# Patient Record
Sex: Male | Born: 1940 | Race: White | Hispanic: No | Marital: Single | Smoking: Current every day smoker
Health system: Southern US, Community
[De-identification: ages and names within clinical notes are randomized; demographics above are authoritative.]

## PROBLEM LIST (undated history)

## (undated) DIAGNOSIS — E785 Hyperlipidemia, unspecified: Secondary | ICD-10-CM

## (undated) DIAGNOSIS — I10 Essential (primary) hypertension: Secondary | ICD-10-CM

## (undated) DIAGNOSIS — K635 Polyp of colon: Secondary | ICD-10-CM

## (undated) DIAGNOSIS — R413 Other amnesia: Secondary | ICD-10-CM

## (undated) DIAGNOSIS — J019 Acute sinusitis, unspecified: Secondary | ICD-10-CM

## (undated) DIAGNOSIS — R0602 Shortness of breath: Secondary | ICD-10-CM

## (undated) DIAGNOSIS — T148XXA Other injury of unspecified body region, initial encounter: Secondary | ICD-10-CM

## (undated) DIAGNOSIS — IMO0002 Reserved for concepts with insufficient information to code with codable children: Secondary | ICD-10-CM

## (undated) DIAGNOSIS — J329 Chronic sinusitis, unspecified: Secondary | ICD-10-CM

## (undated) DIAGNOSIS — M545 Low back pain, unspecified: Secondary | ICD-10-CM

## (undated) DIAGNOSIS — R51 Headache: Secondary | ICD-10-CM

## (undated) DIAGNOSIS — M48061 Spinal stenosis, lumbar region without neurogenic claudication: Secondary | ICD-10-CM

## (undated) DIAGNOSIS — C3490 Malignant neoplasm of unspecified part of unspecified bronchus or lung: Secondary | ICD-10-CM

## (undated) DIAGNOSIS — L24A9 Irritant contact dermatitis due friction or contact with other specified body fluids: Secondary | ICD-10-CM

## (undated) DIAGNOSIS — K219 Gastro-esophageal reflux disease without esophagitis: Secondary | ICD-10-CM

## (undated) DIAGNOSIS — D72829 Elevated white blood cell count, unspecified: Secondary | ICD-10-CM

## (undated) DIAGNOSIS — T7840XA Allergy, unspecified, initial encounter: Secondary | ICD-10-CM

## (undated) DIAGNOSIS — R351 Nocturia: Secondary | ICD-10-CM

## (undated) HISTORY — DX: Low back pain, unspecified: M54.50

## (undated) HISTORY — DX: Hyperlipidemia, unspecified: E78.5

## (undated) HISTORY — DX: Headache: R51

## (undated) HISTORY — DX: Chronic sinusitis, unspecified: J32.9

## (undated) HISTORY — DX: Reserved for concepts with insufficient information to code with codable children: IMO0002

## (undated) HISTORY — DX: Allergy, unspecified, initial encounter: T78.40XA

## (undated) HISTORY — PX: OTHER SURGICAL HISTORY: SHX169

## (undated) HISTORY — PX: PENILE PROSTHESIS IMPLANT: SHX240

## (undated) HISTORY — DX: Other amnesia: R41.3

## (undated) HISTORY — DX: Acute sinusitis, unspecified: J01.90

## (undated) HISTORY — PX: URETHRAL STRICTURE DILATATION: SHX477

## (undated) HISTORY — DX: Essential (primary) hypertension: I10

## (undated) HISTORY — DX: Elevated white blood cell count, unspecified: D72.829

## (undated) HISTORY — DX: Polyp of colon: K63.5

## (undated) HISTORY — PX: COLONOSCOPY: SHX174

## (undated) HISTORY — DX: Gastro-esophageal reflux disease without esophagitis: K21.9

## (undated) HISTORY — DX: Low back pain: M54.5

---

## 1972-02-11 HISTORY — PX: LUMBAR LAMINECTOMY: SHX95

## 2003-05-02 ENCOUNTER — Encounter: Admission: RE | Admit: 2003-05-02 | Discharge: 2003-05-02 | Payer: Self-pay | Admitting: Diagnostic Radiology

## 2003-12-12 ENCOUNTER — Ambulatory Visit: Payer: Self-pay | Admitting: Family Medicine

## 2004-07-24 ENCOUNTER — Ambulatory Visit: Payer: Self-pay | Admitting: Family Medicine

## 2004-08-07 ENCOUNTER — Ambulatory Visit: Payer: Self-pay | Admitting: Family Medicine

## 2005-03-12 ENCOUNTER — Ambulatory Visit: Payer: Self-pay | Admitting: Family Medicine

## 2005-03-18 ENCOUNTER — Ambulatory Visit: Payer: Self-pay

## 2005-07-22 ENCOUNTER — Ambulatory Visit: Payer: Self-pay | Admitting: Family Medicine

## 2006-02-17 ENCOUNTER — Ambulatory Visit: Payer: Self-pay | Admitting: Family Medicine

## 2006-02-18 ENCOUNTER — Ambulatory Visit: Payer: Self-pay | Admitting: Family Medicine

## 2006-02-18 LAB — CONVERTED CEMR LAB
Basophils Relative: 0.1 % (ref 0.0–1.0)
CO2: 28 meq/L (ref 19–32)
Calcium: 9.5 mg/dL (ref 8.4–10.5)
Chol/HDL Ratio, serum: 5.3
Cholesterol: 226 mg/dL (ref 0–200)
Eosinophil percent: 0.7 % (ref 0.0–5.0)
GFR calc non Af Amer: 89 mL/min
Glomerular Filtration Rate, Af Am: 108 mL/min/{1.73_m2}
Glucose, Bld: 101 mg/dL — ABNORMAL HIGH (ref 70–99)
HCT: 44.7 % (ref 39.0–52.0)
Hemoglobin: 15.3 g/dL (ref 13.0–17.0)
Lymphocytes Relative: 22.2 % (ref 12.0–46.0)
Monocytes Absolute: 1.1 10*3/uL — ABNORMAL HIGH (ref 0.2–0.7)
Monocytes Relative: 8.1 % (ref 3.0–11.0)
Neutro Abs: 9 10*3/uL — ABNORMAL HIGH (ref 1.4–7.7)
Neutrophils Relative %: 68.9 % (ref 43.0–77.0)
PSA: 0.67 ng/mL (ref 0.10–4.00)
Potassium: 4.7 meq/L (ref 3.5–5.1)
RDW: 13.4 % (ref 11.5–14.6)
Sodium: 143 meq/L (ref 135–145)
TSH: 1.46 microintl units/mL (ref 0.35–5.50)
VLDL: 14 mg/dL (ref 0–40)
WBC: 13.1 10*3/uL — ABNORMAL HIGH (ref 4.5–10.5)

## 2006-05-27 ENCOUNTER — Ambulatory Visit: Payer: Self-pay | Admitting: Family Medicine

## 2006-05-27 LAB — CONVERTED CEMR LAB
AST: 17 units/L (ref 0–37)
Cholesterol: 111 mg/dL (ref 0–200)
HDL: 29 mg/dL — ABNORMAL LOW (ref >39.0)
LDL Cholesterol: 72 mg/dL (ref 0–99)
Total CHOL/HDL Ratio: 3.8

## 2006-06-11 ENCOUNTER — Encounter: Admission: RE | Admit: 2006-06-11 | Discharge: 2006-06-11 | Payer: Self-pay | Admitting: Family Medicine

## 2006-06-11 ENCOUNTER — Ambulatory Visit: Payer: Self-pay | Admitting: Family Medicine

## 2006-07-28 ENCOUNTER — Ambulatory Visit: Payer: Self-pay | Admitting: Family Medicine

## 2006-10-28 ENCOUNTER — Ambulatory Visit: Payer: Self-pay | Admitting: Family Medicine

## 2006-10-28 DIAGNOSIS — F528 Other sexual dysfunction not due to a substance or known physiological condition: Secondary | ICD-10-CM | POA: Insufficient documentation

## 2006-10-28 DIAGNOSIS — N400 Enlarged prostate without lower urinary tract symptoms: Secondary | ICD-10-CM

## 2006-12-21 ENCOUNTER — Ambulatory Visit: Payer: Self-pay | Admitting: Family Medicine

## 2006-12-22 ENCOUNTER — Encounter: Payer: Self-pay | Admitting: Family Medicine

## 2007-01-13 ENCOUNTER — Ambulatory Visit: Payer: Self-pay | Admitting: Family Medicine

## 2007-02-17 ENCOUNTER — Ambulatory Visit: Payer: Self-pay | Admitting: Family Medicine

## 2007-03-01 LAB — CONVERTED CEMR LAB
ALT: 15 units/L (ref 0–53)
AST: 15 units/L (ref 0–37)
Albumin: 4.1 g/dL (ref 3.5–5.2)
Basophils Absolute: 0 10*3/uL (ref 0.0–0.1)
Calcium: 10 mg/dL (ref 8.4–10.5)
Chloride: 104 meq/L (ref 96–112)
Creatinine, Ser: 0.9 mg/dL (ref 0.4–1.5)
Eosinophils Absolute: 0.1 10*3/uL (ref 0.0–0.6)
Eosinophils Relative: 0.7 % (ref 0.0–5.0)
GFR calc non Af Amer: 89 mL/min
LDL Cholesterol: 126 mg/dL — ABNORMAL HIGH (ref 0–99)
MCHC: 33.4 g/dL (ref 30.0–36.0)
MCV: 95 fL (ref 78.0–100.0)
Platelets: 331 10*3/uL (ref 150–400)
RBC: 4.83 M/uL (ref 4.22–5.81)
RDW: 12.9 % (ref 11.5–14.6)
Total CHOL/HDL Ratio: 4.5
Triglycerides: 88 mg/dL (ref 0–149)
WBC: 12.6 10*3/uL — ABNORMAL HIGH (ref 4.5–10.5)

## 2007-07-28 ENCOUNTER — Ambulatory Visit: Payer: Self-pay | Admitting: Family Medicine

## 2007-07-28 DIAGNOSIS — B369 Superficial mycosis, unspecified: Secondary | ICD-10-CM | POA: Insufficient documentation

## 2008-04-06 ENCOUNTER — Ambulatory Visit: Payer: Self-pay | Admitting: Family Medicine

## 2008-04-06 DIAGNOSIS — L719 Rosacea, unspecified: Secondary | ICD-10-CM | POA: Insufficient documentation

## 2008-04-06 LAB — CONVERTED CEMR LAB: Hemoglobin: 16.1 g/dL

## 2008-04-12 ENCOUNTER — Telehealth: Payer: Self-pay | Admitting: Family Medicine

## 2008-04-12 LAB — CONVERTED CEMR LAB
Albumin: 4.2 g/dL (ref 3.5–5.2)
Alkaline Phosphatase: 93 units/L (ref 39–117)
Cholesterol: 208 mg/dL (ref 0–200)
Direct LDL: 148.2 mg/dL
HDL: 39.4 mg/dL (ref >39.0)
Total Protein: 7.8 g/dL (ref 6.0–8.3)
Triglycerides: 108 mg/dL (ref 0–149)
VLDL: 22 mg/dL (ref 0–40)

## 2008-04-18 ENCOUNTER — Ambulatory Visit: Payer: Self-pay | Admitting: Family Medicine

## 2008-04-18 DIAGNOSIS — I10 Essential (primary) hypertension: Secondary | ICD-10-CM

## 2008-09-19 ENCOUNTER — Encounter (INDEPENDENT_AMBULATORY_CARE_PROVIDER_SITE_OTHER): Payer: Self-pay | Admitting: *Deleted

## 2008-10-17 ENCOUNTER — Ambulatory Visit: Payer: Self-pay | Admitting: Internal Medicine

## 2008-11-07 ENCOUNTER — Encounter: Payer: Self-pay | Admitting: Internal Medicine

## 2008-11-07 ENCOUNTER — Ambulatory Visit: Payer: Self-pay | Admitting: Internal Medicine

## 2008-11-12 ENCOUNTER — Encounter: Payer: Self-pay | Admitting: Internal Medicine

## 2009-03-08 ENCOUNTER — Ambulatory Visit: Payer: Self-pay | Admitting: Family Medicine

## 2009-03-08 DIAGNOSIS — M940 Chondrocostal junction syndrome [Tietze]: Secondary | ICD-10-CM

## 2009-04-19 ENCOUNTER — Ambulatory Visit: Payer: Self-pay | Admitting: Family Medicine

## 2009-05-03 ENCOUNTER — Ambulatory Visit: Payer: Self-pay | Admitting: Family Medicine

## 2009-05-03 DIAGNOSIS — J449 Chronic obstructive pulmonary disease, unspecified: Secondary | ICD-10-CM

## 2009-05-10 ENCOUNTER — Ambulatory Visit: Payer: Self-pay | Admitting: Family Medicine

## 2009-05-24 ENCOUNTER — Ambulatory Visit: Payer: Self-pay | Admitting: Family Medicine

## 2009-10-22 ENCOUNTER — Ambulatory Visit: Payer: Self-pay | Admitting: Family Medicine

## 2009-10-22 DIAGNOSIS — F17200 Nicotine dependence, unspecified, uncomplicated: Secondary | ICD-10-CM | POA: Insufficient documentation

## 2009-11-05 ENCOUNTER — Ambulatory Visit: Payer: Self-pay | Admitting: Family Medicine

## 2009-11-05 DIAGNOSIS — K219 Gastro-esophageal reflux disease without esophagitis: Secondary | ICD-10-CM

## 2009-11-05 LAB — CONVERTED CEMR LAB
Basophils Relative: 0.5 % (ref 0.0–3.0)
Bilirubin Urine: NEGATIVE
Blood in Urine, dipstick: NEGATIVE
Eosinophils Absolute: 0.1 10*3/uL (ref 0.0–0.7)
Eosinophils Relative: 1.5 % (ref 0.0–5.0)
Hemoglobin: 14.7 g/dL (ref 13.0–17.0)
Ketones, urine, test strip: NEGATIVE
Lymphocytes Relative: 20.2 % (ref 12.0–46.0)
MCHC: 34.3 g/dL (ref 30.0–36.0)
Neutro Abs: 6.7 10*3/uL (ref 1.4–7.7)
Nitrite: NEGATIVE
RBC: 4.55 M/uL (ref 4.22–5.81)
Specific Gravity, Urine: 1.015
Urobilinogen, UA: 0.2
WBC: 10 10*3/uL (ref 4.5–10.5)

## 2009-12-03 ENCOUNTER — Ambulatory Visit: Payer: Self-pay | Admitting: Family Medicine

## 2010-01-17 ENCOUNTER — Ambulatory Visit: Payer: Self-pay | Admitting: Family Medicine

## 2010-01-17 DIAGNOSIS — S335XXA Sprain of ligaments of lumbar spine, initial encounter: Secondary | ICD-10-CM

## 2010-01-17 DIAGNOSIS — S339XXA Sprain of unspecified parts of lumbar spine and pelvis, initial encounter: Secondary | ICD-10-CM | POA: Insufficient documentation

## 2010-02-21 ENCOUNTER — Ambulatory Visit
Admission: RE | Admit: 2010-02-21 | Discharge: 2010-02-21 | Payer: Self-pay | Source: Home / Self Care | Attending: Family Medicine | Admitting: Family Medicine

## 2010-02-21 DIAGNOSIS — M5430 Sciatica, unspecified side: Secondary | ICD-10-CM | POA: Insufficient documentation

## 2010-03-10 LAB — CONVERTED CEMR LAB
Albumin: 4.1 g/dL (ref 3.5–5.2)
Alkaline Phosphatase: 82 units/L (ref 39–117)
BUN: 14 mg/dL (ref 6–23)
BUN: 18 mg/dL (ref 6–23)
Basophils Absolute: 0 10*3/uL (ref 0.0–0.1)
Basophils Relative: 0 % (ref 0.0–3.0)
Bilirubin, Direct: 0.1 mg/dL (ref 0.0–0.3)
Chloride: 108 meq/L (ref 96–112)
Cholesterol: 196 mg/dL (ref 0–200)
Creatinine, Ser: 0.8 mg/dL (ref 0.4–1.5)
Eosinophils Absolute: 0.1 10*3/uL (ref 0.0–0.7)
Eosinophils Absolute: 0.2 10*3/uL (ref 0.0–0.7)
Glucose, Bld: 109 mg/dL — ABNORMAL HIGH (ref 70–99)
H Pylori IgG: NEGATIVE
LDL Cholesterol: 132 mg/dL — ABNORMAL HIGH (ref 0–99)
Lymphocytes Relative: 19.1 % (ref 12.0–46.0)
Lymphocytes Relative: 25.9 % (ref 12.0–46.0)
MCHC: 33.3 g/dL (ref 30.0–36.0)
MCHC: 34 g/dL (ref 30.0–36.0)
Neutrophils Relative %: 59.7 % (ref 43.0–77.0)
Neutrophils Relative %: 70.3 % (ref 43.0–77.0)
Phosphorus: 2.2 mg/dL — ABNORMAL LOW (ref 2.3–4.6)
Platelets: 298 10*3/uL (ref 150.0–400.0)
Potassium: 4.6 meq/L (ref 3.5–5.1)
Potassium: 4.8 meq/L (ref 3.5–5.1)
RBC: 4.66 M/uL (ref 4.22–5.81)
RDW: 13.7 % (ref 11.5–14.6)
Triglycerides: 85 mg/dL (ref 0.0–149.0)
VLDL: 17 mg/dL (ref 0.0–40.0)
WBC: 8.2 10*3/uL (ref 4.5–10.5)

## 2010-03-12 NOTE — Assessment & Plan Note (Signed)
Summary: 2 wk rov/njr   Vital Signs:  Patient profile:   70 year old male Weight:      200 pounds O2 Sat:      97 % Temp:     98 degrees F Pulse rate:   89 / minute BP sitting:   160 / 90  (left arm)  Vitals Entered By: Pura Spice, RN (May 03, 2009 10:43 AM) CC: 2 wk follow up coughing up phlegm whiteish color ongoing since jan 2011 still hurts in chest Is Patient Diabetic? No   History of Present Illness: [age] yr old  white male has had chest pain unexplained for some time, would improve but then recur here he has very atypical pain not necessarily related to exertion and was thought previously to be due to his costochondritis which is improved He is a smoker and has mild COPD but not on any medications. Over the past 3 months he has had some mild cough her duct above whitish sputum Pain is over the sternum and not related to eating nor activity Blood pressure 180/90 on arrival and then repeated was 160/90 overweight she has not been taking his lisinopril but does take it most days, will add HCTZ to review her lab studies for terminal CBC as well as lipid panel  Allergies (verified): No Known Drug Allergies  Review of Systems  The patient denies anorexia, fever, weight loss, weight gain, vision loss, decreased hearing, hoarseness, chest pain, syncope, dyspnea on exertion, peripheral edema, prolonged cough, headaches, hemoptysis, abdominal pain, melena, hematochezia, severe indigestion/heartburn, hematuria, incontinence, genital sores, muscle weakness, suspicious skin lesions, transient blindness, difficulty walking, depression, unusual weight change, abnormal bleeding, enlarged lymph nodes, angioedema, breast masses, and testicular masses.    Physical Exam  General:  Well-developed,well-nourished,in no acute distress; alert,appropriate and cooperative throughout examination Head:  Normocephalic and atraumatic without obvious abnormalities. No apparent alopecia or  balding. Eyes:  No corneal or conjunctival inflammation noted. EOMI. Perrla. Funduscopic exam benign, without hemorrhages, exudates or papilledema. Vision grossly normal. Ears:  External ear exam shows no significant lesions or deformities.  Otoscopic examination reveals clear canals, tympanic membranes are intact bilaterally without bulging, retraction, inflammation or discharge. Hearing is grossly normal bilaterally. Nose:  External nasal examination shows no deformity or inflammation. Nasal mucosa are pink and moist without lesions or exudates. Mouth:  Oral mucosa and oropharynx without lesions or exudates.  Teeth in good repair. Chest Wall:  No deformities, masses, tenderness or gynecomastia noted. Lungs:  decreased breath sounds posteriorly occasional wheezeno dullness and no crackles.  no dullness and no crackles.   Heart:  Normal rate and regular rhythm. S1 and S2 normal without gallop, murmur, click, rub or other extra sounds. Extremities:  No clubbing, cyanosis, edema, or deformity noted with normal full range of motion of all joints.     Impression & Recommendations:  Problem # 1:  COUGH (ICD-786.2) Assessment Unchanged  Orders: T-2 View CXR (71020TC)  Problem # 2:  COSTOCHONDRITIS (ICD-733.6) Assessment: Improved  Orders: T-2 View CXR (71020TC)  Problem # 3:  HYPERTENSION (ICD-401.9) Assessment: Deteriorated  His updated medication list for this problem includes:    Lisinopril 20 Mg Tabs (Lisinopril) .Marland Kitchen... 1 each day for hypertension, high blood pressure  His updated medication list for this problem includes:    Lisinopril 20 Mg Tabs (Lisinopril) .Marland Kitchen... 1 each day for hypertension, high blood pressure  Problem # 4:  CHEST PAIN UNSPECIFIED (ICD-786.50) Assessment: Unchanged  Orders: T-2 View CXR (71020TC)  Complete Medication List: 1)  Lisinopril 20 Mg Tabs (Lisinopril) .Marland Kitchen.. 1 each day for hypertension, high blood pressure 2)  Aspirin 81 Mg Tbec (Aspirin) .Marland Kitchen.. 1  qd  Patient Instructions: 1)  Unable to explain pain, costochondritis is much improve 2)  continues to cough, chest exam not remarkable 3)  to get chest Xray and CT scan if needed 4)  to reevaluate her blood pressure on return visit and possibly add hydrochlorothiazide

## 2010-03-12 NOTE — Assessment & Plan Note (Signed)
Summary: 1 month fup//ccm   Vital Signs:  Patient profile:   70 year old male Height:      69.5 inches (176.53 cm) Weight:      199.31 pounds (90.60 kg) O2 Sat:      98 % on Room air Temp:     97.6 degrees F (36.44 degrees C) oral Pulse rate:   79 / minute BP sitting:   170 / 90  (left arm) Cuff size:   large  Vitals Entered By: Josph Macho RMA (December 03, 2009 10:00 AM)  O2 Flow:  Room air CC: 1 month follow up/ CF Is Patient Diabetic? No   History of Present Illness: Patient is in today for reevaluation of his BP. He had been feeling well really until this am. He woke up feeling a little "swimmy" headed and his bp was up similar to what we see here. In general he has had increased energy and no HA/CP/palp/SOB since his last visit. Does note some increased low back pain and radicular symptoms down the right leg. He describes some increased numbness in his anterior right thigh. No weakness or falls. No recent falls or injury. No incontinence bowel, bladder. No f/c/GI or GU c/o.  Current Medications (verified): 1)  Aspirin 81 Mg Tbec (Aspirin) .Marland Kitchen.. 1 Qd 2)  Ranitidine Hcl 150 Mg Tabs (Ranitidine Hcl) .Marland Kitchen.. 1 Tab By Mouth Two Times A Day As Needed Reflux 3)  Tramadol Hcl 50 Mg Tabs (Tramadol Hcl) .Marland Kitchen.. 1 Tab By Mouth Three Times A Day As Needed Pain 4)  Toprol Xl 50 Mg Xr24h-Tab (Metoprolol Succinate) .Marland Kitchen.. 1 Tab By Mouth Daily  Allergies (verified): No Known Drug Allergies  Past History:  Past medical history reviewed for relevance to current acute and chronic problems. Social history (including risk factors) reviewed for relevance to current acute and chronic problems.  Past Medical History: Reviewed history from 05/10/2009 and no changes required. hypertension  Social History: Reviewed history and no changes required.  Review of Systems      See HPI  Physical Exam  General:  Well-developed,well-nourished,in no acute distress; alert,appropriate and cooperative  throughout examination Head:  Normocephalic and atraumatic without obvious abnormalities. No apparent alopecia or balding. Neck:  No deformities, masses, or tenderness noted. Lungs:  Normal respiratory effort, chest expands symmetrically. Lungs are clear to auscultation, no crackles or wheezes. Heart:  Normal rate and regular rhythm. S1 and S2 normal without gallop, click, rub or other extra sounds. Abdomen:  Bowel sounds positive,abdomen soft and non-tender without masses, organomegaly or hernias noted. Extremities:  No clubbing, cyanosis, edema, or deformity noted  Psych:  Cognition and judgment appear intact. Alert and cooperative with normal attention span and concentration. No apparent delusions, illusions, hallucinations   Impression & Recommendations:  Problem # 1:  LOW BACK PAIN, ACUTE (ICD-724.2)  His updated medication list for this problem includes:    Aspirin 81 Mg Tbec (Aspirin) .Marland Kitchen... 1 qd    Tramadol Hcl 50 Mg Tabs (Tramadol hcl) .Marland Kitchen... 1 tab by mouth three times a day as needed pain  Orders: Admin of Therapeutic Inj  intramuscular or subcutaneous (32355) Depo- Medrol 40mg  (J1030) Now with some radicular symptoms down right leg, continues to decline Orthopaedic referral. will call if symptoms worsen  Problem # 2:  HYPERTENSION (ICD-401.9)  The following medications were removed from the medication list:    Toprol Xl 50 Mg Xr24h-tab (Metoprolol succinate) .Marland Kitchen... 1 tab by mouth daily His updated medication list for this problem includes:  Toprol Xl 100 Mg Xr24h-tab (Metoprolol succinate) .Marland Kitchen... 1 tab by mouth daily Poorly contolled, will reeval at next visit  Problem # 3:  TOBACCO USER (ICD-305.1) Encouraged complete cessation  Problem # 4:  GERD (ICD-530.81)  His updated medication list for this problem includes:    Ranitidine Hcl 150 Mg Tabs (Ranitidine hcl) .Marland Kitchen... 1 tab by mouth two times a day as needed reflux Avoid offending foods  Complete Medication List: 1)   Aspirin 81 Mg Tbec (Aspirin) .Marland Kitchen.. 1 qd 2)  Ranitidine Hcl 150 Mg Tabs (Ranitidine hcl) .Marland Kitchen.. 1 tab by mouth two times a day as needed reflux 3)  Tramadol Hcl 50 Mg Tabs (Tramadol hcl) .Marland Kitchen.. 1 tab by mouth three times a day as needed pain 4)  Toprol Xl 100 Mg Xr24h-tab (Metoprolol succinate) .Marland Kitchen.. 1 tab by mouth daily  Patient Instructions: 1)  Please schedule a follow-up appointment in 1 month.  2)  Most patients (90%) with low back pain will improve with time ( 2-6 weeks). Keep active but avoid activities that are painful. Apply moist heat and/or ice to lower back several times a day.  3)  Take 650 - 1000 mg of tylenol every 4-6 hours as needed for relief of pain or comfort of fever. Avoid taking more than 3000 mg in a 24 hour period( can cause liver damage in higher doses).  4)  Call for ortho referral if pain persists Prescriptions: TOPROL XL 100 MG XR24H-TAB (METOPROLOL SUCCINATE) 1 tab by mouth daily  #30 x 2   Entered and Authorized by:   Danise Edge MD   Signed by:   Danise Edge MD on 12/03/2009   Method used:   Electronically to        [facility]  [address] # [id number]* (retail)       [address]       [place], [place]  [address]       Ph: [phone] or [phone]       Fax: [phone]   RxID:   [id number]    Medication Administration  Injection # 1:    Medication: Depo- Medrol 40mg     Diagnosis: LOW BACK PAIN, ACUTE (ICD-724.2)    Route: IM    Site: RUOQ gluteus    Exp Date: 5/12    Lot #: Levora [id number]    Mfr: Pharmacia    Patient tolerated injection without complications    Given by: Josph Macho RMA (December 03, 2009 10:53 AM)  Orders Added: 1)  Admin of Therapeutic Inj  intramuscular or subcutaneous [96372] 2)  Depo- Medrol 40mg  [J1030] 3)  Est. Patient Level IV [55974]

## 2010-03-12 NOTE — Assessment & Plan Note (Signed)
Summary: fu on xray/njr   Vital Signs:  Patient profile:   70 year old male Weight:      200 pounds O2 Sat:      96 % Temp:     98.3 degrees F Pulse rate:   102 / minute BP sitting:   156 / 90  (left arm) Cuff size:   large  Vitals Entered By: Pura Spice, RN (May 10, 2009 8:50 AM) CC: still some coughing chest upper epigastric region     History of Present Illness: this 70 year old white male who continues to complain of substernal and mid sternal pain on the episodic in nature not related to exertion. Continues to have his episodes of coughing which are nonproductive Blood pressure is elevated 156/90 even though he has been taking his lisinopril 20 mg each day Patient continues to be concerned about erectile dysfunction and given Viagra, Cialis and Levitra to try again if not improved to consider injections  Preventive Screening-Counseling & Management  Alcohol-Tobacco     Smoking Status: current     Packs/Day: 1.0  Allergies: No Known Drug Allergies  Past History:  Past Medical History: hypertension  Social History: Smoking Status:  current Packs/Day:  1.0  Review of Systems General:  See HPI; Denies chills, fatigue, fever, loss of appetite, malaise, sleep disorder, sweats, weakness, and weight loss. Eyes:  Denies blurring, discharge, double vision, eye irritation, eye pain, halos, itching, light sensitivity, red eye, vision loss-1 eye, and vision loss-both eyes. ENT:  Denies decreased hearing, difficulty swallowing, ear discharge, earache, hoarseness, nasal congestion, nosebleeds, postnasal drainage, ringing in ears, sinus pressure, and sore throat. CV:  Denies bluish discoloration of lips or nails, chest pain or discomfort, difficulty breathing at night, difficulty breathing while lying down, fainting, fatigue, leg cramps with exertion, lightheadness, near fainting, palpitations, shortness of breath with exertion, swelling of feet, swelling of hands, and weight  gain. Resp:  Complains of chest discomfort, cough, and wheezing; wheezing minimal. GI:  Complains of indigestion; third. GU:  Complains of erectile dysfunction. MS:  Denies joint pain, joint redness, joint swelling, loss of strength, low back pain, mid back pain, muscle aches, muscle , cramps, muscle weakness, stiffness, and thoracic pain.  Physical Exam  General:  Well-developed,well-nourished,in no acute distress; alert,appropriate and cooperative throughout examination Head:  Normocephalic and atraumatic without obvious abnormalities. No apparent alopecia or balding. Eyes:  No corneal or conjunctival inflammation noted. EOMI. Perrla. Funduscopic exam benign, without hemorrhages, exudates or papilledema. Vision grossly normal. Ears:  External ear exam shows no significant lesions or deformities.  Otoscopic examination reveals clear canals, tympanic membranes are intact bilaterally without bulging, retraction, inflammation or discharge. Hearing is grossly normal bilaterally. Nose:  External nasal examination shows no deformity or inflammation. Nasal mucosa are pink and moist without lesions or exudates. Mouth:  Oral mucosa and oropharynx without lesions or exudates.  Teeth in good repair. Lungs:  decreased breath sounds bilaterally with minimal expiratory wheeze on expirationno dullness and no crackles.   Heart:  Normal rate and regular rhythm. S1 and S2 normal without gallop, murmur, click, rub or other extra sounds. Abdomen:  Bowel sounds positive,abdomen soft and non-tender without masses, organomegaly or hernias noted. Extremities:  No clubbing, cyanosis, edema, or deformity noted with normal full range of motion of all joints.     Impression & Recommendations:  Problem # 1:  COPD (ICD-496) Assessment Unchanged Advair 250/52 amylase and b.i.d.  Problem # 2:  CHEST PAIN UNSPECIFIED (ICD-786.50) Assessment: Unchanged  Problem # 3:  GERD (ICD-530.81) Assessment: New  Problem # 4:   COUGH (ICD-786.2) Assessment: Unchanged  Problem # 5:  HYPERTENSION (ICD-401.9) Assessment: Deteriorated  His updated medication list for this problem includes:    Lisinopril 20 Mg Tabs (Lisinopril) .Marland Kitchen... 1 each day for hypertension, high blood pressure    Hydrochlorothiazide 25 Mg Tabs (Hydrochlorothiazide) ..... One q.d.  Complete Medication List: 1)  Lisinopril 20 Mg Tabs (Lisinopril) .Marland Kitchen.. 1 each day for hypertension, high blood pressure 2)  Aspirin 81 Mg Tbec (Aspirin) .Marland Kitchen.. 1 qd 3)  Hydrochlorothiazide 25 Mg Tabs (Hydrochlorothiazide) .... One q.d.  Patient Instructions: 1)  Chest Xray only showed obstructive lung disease due to smoking 2)  Start Advair  INHALER  two INHALATION am AND pm 3)  tRAMADOL 1 TAB 4 TIMES DAILY TO STOP COUGHING 4)  MINIMIZE SMOKING 5)  AD HCTZ 25 MG EACH DAY FOR BLOOD PRESSURE THEN AFTER 1 MONTH WILL CHANGE LISINOPRIL TO LISINOPRILhct 1 PER DAY 6)  COME IN TO SEE ME IN 2 WEEKS to rerfer to Pulmonologist Prescriptions: HYDROCHLOROTHIAZIDE 25 MG TABS (HYDROCHLOROTHIAZIDE) one q.d.  #30 x 11   Entered and Authorized by:   Judithann Sheen MD   Signed by:   Judithann Sheen MD on 05/14/2009   Method used:   Print then Give to Patient   RxID:   [id number]

## 2010-03-12 NOTE — Assessment & Plan Note (Signed)
Summary: chest discomfort/back pain/njr   Vital Signs:  Patient profile:   70 year old male Height:      69.5 inches (176.53 cm) Weight:      200 pounds (90.91 kg) BMI:     29.22 O2 Sat:      98 % on Room air Temp:     98.3 degrees F (36.83 degrees C) oral Pulse rate:   91 / minute BP sitting:   172 / 90  (left arm) Cuff size:   regular  Vitals Entered By: Josph Macho RMA (October 22, 2009 10:17 AM)  O2 Flow:  Room air  Serial Vital Signs/Assessments:  Time      Position  BP       Pulse  Resp  Temp     By                     152/84                         Danise Edge MD  CC: Chest discomfort/ Lower back pain- right leg feels like its going numb X1-2 weeks/ CF Is Patient Diabetic? No   History of Present Illness: Patient in today with multiple complaints. Is c/o low low back pain with radicular symptoms down b/l LE at times. He gets shooting pains down legs which go as quick as they come.the shooting pains generally occur with position changes. He is noting some intermittent numbness in the right lower extremity only which can radiate as far down as thought. He's had no loss of use from the leg nor has he had any falls as a result of the numbness and this comes and goes as well. He is acknowledging some dysuria present for the past few weeks. Also notes some urinary frequency as well. He sees a urologist for history of BPH and he further complains of some intermittent loose stool his last episode was 2 days ago. He denies chills but has had some intermittent sweats without other associated symptoms. He has a long-standing history of epigastric discomfort and indigestion intermittently. Does note recently having 3-4 days and the role of no bowel movement only then did have some diarrhea at times. When he does move his bowels he often has fine he has to strain he denies any bloody or dark stool has an ongoing history of anorexia. Had one episode 3-4 weeks ago for severe nausea and  some dry heating but this has not been recurrent. He is a one pack per day smoker and has frequent shortness of breath and wheezing especially with exertion. Denies chest pain except for some epigastric and lower chest pain to palpation. No palpitations. He does waking up with a sour brackish liquid in his throat frequently and some intermittent heartburn lately. He stopped his Lisinoprilhct a couple days ago because he says it made him feel weak and have blurry vision. He feels better since stopping it.  Preventive Screening-Counseling & Management  Alcohol-Tobacco     Smoking Cessation Counseling: YES  Current Medications (verified): 1)  Aspirin 81 Mg Tbec (Aspirin) .Marland Kitchen.. 1 Qd 2)  Lisinopril-Hydrochlorothiazide 10-12.5 Mg Tabs (Lisinopril-Hydrochlorothiazide) .Marland Kitchen.. 1 Each Day For Blood Pressure 3)  Magic Mouth Wash Hc .Marland Kitchen.. 1 Tsp in Mouth, Rinse Garle and Swallow Qid Until Well  Allergies (verified): No Known Drug Allergies  Past History:  Past medical history reviewed for relevance to current acute and chronic problems. Social history (including risk  factors) reviewed for relevance to current acute and chronic problems.  Past Medical History: Reviewed history from 05/10/2009 and no changes required. hypertension  Social History: Reviewed history and no changes required.  Review of Systems      See HPI  Physical Exam  General:  Well-developed,well-nourished,in no acute distress; alert,appropriate and cooperative throughout examination Head:  Normocephalic and atraumatic without obvious abnormalities. No apparent alopecia or balding. Ears:  External ear exam shows no significant lesions or deformities.  Otoscopic examination reveals clear canals, tympanic membranes are intact bilaterally without bulging, retraction, inflammation or discharge. Hearing is grossly normal bilaterally. Nose:  External nasal examination shows no deformity or inflammation. Nasal mucosa are pink and moist  without lesions or exudates. Mouth:  Oral mucosa and oropharynx without lesions or exudates.  Mild erythema in posterior oropharynx Neck:  No deformities, masses, or tenderness noted. Lungs:  Normal respiratory effort, chest expands symmetrically. Lungs are clear to auscultation, no crackles or wheezes. Heart:  Normal rate and regular rhythm. S1 and S2 normal without gallop, click, rub or other extra sounds.grade 2 /6 systolic murmur.   Abdomen:  soft, normal bowel sounds, no masses, no guarding, no rigidity, no rebound tenderness, and epigastric tenderness.   Pulses:  R and L posterior tibial pulses are full and equal bilaterally Extremities:  No clubbing, cyanosis, edema, or deformity noted with normal full range of motion of all joints.   Cervical Nodes:  No lymphadenopathy noted Psych:  Cognition and judgment appear intact. Alert and cooperative with normal attention span and concentration. No apparent delusions, illusions, hallucinations   Impression & Recommendations:  Problem # 1:  LOW BACK PAIN, ACUTE (ICD-724.2)  His updated medication list for this problem includes:    Aspirin 81 Mg Tbec (Aspirin) .Marland Kitchen... 1 qd    Tramadol Hcl 50 Mg Tabs (Tramadol hcl) .Marland Kitchen... 1 tab by mouth three times a day as needed pain  Orders: UA Dipstick w/o Micro (automated)  (81003) T-Lumbar Spine 2 Views (72100TC) Specimen Handling ([address]) Venipuncture (60454) Prescription Created Electronically [phone])  Report worsening symptoms for further evaluation  Problem # 2:  COPD (ICD-496) Encouraged complete smoking cessation, he reports he has tried previously but not recently. Encouraged to continue his attemps, counselled for > 3 minutes  Problem # 3:  HYPERTENSION (ICD-401.9)  The following medications were removed from the medication list:    Lisinopril-hydrochlorothiazide 10-12.5 Mg Tabs (Lisinopril-hydrochlorothiazide) .Marland Kitchen... 1 each day for blood pressure His updated medication list for this problem  includes:    Toprol Xl 25 Mg Xr24h-tab (Metoprolol succinate) .Marland Kitchen... 1 tab by mouth qd  Orders: TLB-Renal Function Panel (80069-RENAL) TLB-Hepatic/Liver Function Pnl (80076-HEPATIC) Changed meds and recheck in 2 weeks or as needed  Problem # 4:  EPIGASTRIC PAIN (ICD-789.06)  Orders: TLB-H. Pylori Abs(Helicobacter Pylori) (86677-HELICO) Specimen Handling (91478) Venipuncture (29562) Prescription Created Electronically [phone]) Start Ranitidine, avoid offending foods  Complete Medication List: 1)  Aspirin 81 Mg Tbec (Aspirin) .Marland Kitchen.. 1 qd 2)  Magic Mouth Wash Hc  .Marland Kitchen.. 1 tsp in mouth, rinse garle and swallow qid until well 3)  Toprol Xl 25 Mg Xr24h-tab (Metoprolol succinate) .Marland Kitchen.. 1 tab by mouth qd 4)  Ranitidine Hcl 150 Mg Tabs (Ranitidine hcl) .Marland Kitchen.. 1 tab by mouth two times a day as needed reflux 5)  Tramadol Hcl 50 Mg Tabs (Tramadol hcl) .Marland Kitchen.. 1 tab by mouth three times a day as needed pain  Other Orders: TLB-CBC Platelet - w/Differential (85025-CBCD) Tobacco use cessation intermediate 3-10 minutes (57846)  Patient Instructions: 1)  Please schedule a follow-up appointment in 2 weeks.  2)  Limit your Sodium(salt) .  3)  Tobacco is very bad for your health and your loved ones ! You should stop smoking !  4)  Stop smoking tips: Choose a quit date. Cut down before the quit date. Decide what you will do as a substitute when you feel the urge to smoke(gum, toothpick, exercise).  5)  Most patients (90%) with low back pain will improve with time ( 2-6 weeks). Keep active but avoid activities that are painful. Apply moist heat and/or ice to lower back several times a day.  Prescriptions: TRAMADOL HCL 50 MG TABS (TRAMADOL HCL) 1 tab by mouth three times a day as needed pain  #60 x 1   Entered and Authorized by:   Danise Edge MD   Signed by:   Danise Edge MD on 10/22/2009   Method used:   Electronically to        [facility]  [address] # [id number]* (retail)       [address]        [place], [place]  [address]       Ph: [phone] or [phone]       Fax: [phone]   RxID:   [id number] RANITIDINE HCL 150 MG TABS (RANITIDINE HCL) 1 tab by mouth two times a day as needed reflux  #60 x 3   Entered and Authorized by:   Danise Edge MD   Signed by:   Danise Edge MD on 10/22/2009   Method used:   Electronically to        [facility]  [address] # [id number]* (retail)       [address]       [place], [place]  [address]       Ph: [phone] or [phone]       Fax: [phone]   RxID:   [id number] TOPROL XL 25 MG XR24H-TAB (METOPROLOL SUCCINATE) 1 tab by mouth qd  #30 x 2   Entered and Authorized by:   Danise Edge MD   Signed by:   Danise Edge MD on 10/22/2009   Method used:   Electronically to        [facility]  [address] # [id number]* (retail)       [address]       [place], [place]  [address]       Ph: [phone] or [phone]       Fax: [phone]   RxID:   [id number]

## 2010-03-12 NOTE — Assessment & Plan Note (Signed)
Summary: chest discomfort/njr   Vital Signs:  Patient profile:   70 year old male Weight:      200 pounds O2 Sat:      98 % Temp:     98 degrees F oral Pulse rate:   103 / minute Resp:     24 per minute BP sitting:   168 / 80  Vitals Entered By: Lynann Beaver CMA (March 08, 2009 10:07 AM) CC: chest pain x 2 weeks with cough Is Patient Diabetic? No   History of Present Illness: This 70 year old white male is complaining of pain superior to the epigastric region and the rib cage. Pain on coughing deep inspiration but not on exertion him of pain when coughing and does have a cough in the morning are related to his continuing to smoke not productive. His blood pressure was found a 168/80, rechecked 3 times Patient gets his PSA from Dr. Patience Musca a urologist in [place] No gastrointestinal symptoms No episodes of diaphoresis, no orthopnea, no peripheral edema  Current Medications (verified): 1)  None  Allergies (verified): No Known Drug Allergies  Review of Systems  The patient denies anorexia, fever, weight loss, weight gain, vision loss, decreased hearing, hoarseness, chest pain, syncope, dyspnea on exertion, peripheral edema, prolonged cough, headaches, hemoptysis, abdominal pain, melena, hematochezia, severe indigestion/heartburn, hematuria, incontinence, genital sores, muscle weakness, suspicious skin lesions, transient blindness, difficulty walking, depression, unusual weight change, abnormal bleeding, enlarged lymph nodes, angioedema, breast masses, and testicular masses.    Physical Exam  General:  Well-developed,well-nourished,in no acute distress; alert,appropriate and cooperative throughout examination Neck:  No deformities, masses, or tenderness noted. Chest Wall:  tenderness right costochondral area 5 through 8 Lungs:  decreased breath sounds with minimal rales at both bases no wheezing no dullness to percussion Heart:  Normal rate and regular rhythm. S1 and S2  normal without gallop, murmur, click, rub or other extra sounds. Abdomen:  Bowel sounds positive,abdomen soft and non-tender without masses, organomegaly or hernias noted. Rectal:  none exam Extremities:  No clubbing, cyanosis, edema, or deformity noted with normal full range of motion of all joints.     Impression & Recommendations:  Problem # 1:  COSTOCHONDRITIS (ICD-733.6) Assessment New Motrin 800 mg t.i.d.  Problem # 2:  CHEST PAIN UNSPECIFIED (ICD-786.50) Assessment: New  Orders: TLB-CBC Platelet - w/Differential (85025-CBCD)  Problem # 3:  HYPERTENSION (ICD-401.9) Assessment: Deteriorated  The following medications were removed from the medication list:    Lisinopril 20 Mg Tabs (Lisinopril) .Marland Kitchen... 1 once daily for blood pressure His updated medication list for this problem includes:    Lisinopril 20 Mg Tabs (Lisinopril) .Marland Kitchen... 1 each day for hypertension, high blood pressure  Orders: TLB-BMP (Basic Metabolic Panel-BMET) (80048-METABOL) Prescription Created Electronically [phone])  Problem # 4:  FATIGUE (ICD-780.79) Assessment: Improved  Complete Medication List: 1)  Lisinopril 20 Mg Tabs (Lisinopril) .Marland Kitchen.. 1 each day for hypertension, high blood pressure 2)  Aspirin 81 Mg Tbec (Aspirin) .Marland Kitchen.. 1 qd  Other Orders: EKG w/ Interpretation (93000) EKG w/ Interpretation (93000) TLB-Lipid Panel (80061-LIPID)  Patient Instructions: 1)  Blood pressure elevated needs to restart lisinopril 20 mg for bllod pressure 2)  chest pain is from costochondritis, take arthrotec 50 mg after breakfast and supper 3)  wll  call lab results 4)  return 1 month for Blood pressure recheck Prescriptions: LISINOPRIL 20 MG TABS (LISINOPRIL) 1 each day for hypertension, high blood pressure  #30 x 11   Entered and Authorized by:   Ivar Drape  Danice Goltz MD   Signed by:   Judithann Sheen MD on 03/08/2009   Method used:   Electronically to        [facility]. # [id number]* (retail)        [address]       [place], [place]  [address]       Ph: [phone] or [phone]       Fax: [phone]   RxID:   [id number]

## 2010-03-12 NOTE — Assessment & Plan Note (Signed)
Summary: 2 wk rov/njr   Vital Signs:  Patient profile:   70 year old male Weight:      201 pounds O2 Sat:      97 % Temp:     97.7 degrees F BP sitting:   144 / 82  (left arm) Cuff size:   large  Vitals Entered By: Pura Spice, RN (May 24, 2009 9:19 AM)  History of Present Illness: December 29-year-old white male smoker is in for follow out of chest pain that she was seen approximately 2 weeks ago. At this time his blood pressure was elevated but is 144/82 at this time. He did not get the hydrochlorothiazide that I prescribed but he has cut down on his salt intake He has no other complaints this time except some problem with nasal congestion which is most like secondary to tree pollen or an allergic rhinitis  Allergies: No Known Drug Allergies  Past History:  Past Medical History: Last updated: 05/10/2009 hypertension  Risk Factors: Smoking Status: current (05/10/2009) Packs/Day: 1.0 (05/10/2009)  Review of Systems      See HPI  The patient denies anorexia, fever, weight loss, weight gain, vision loss, decreased hearing, hoarseness, chest pain, syncope, dyspnea on exertion, peripheral edema, prolonged cough, headaches, hemoptysis, abdominal pain, melena, hematochezia, severe indigestion/heartburn, hematuria, incontinence, genital sores, muscle weakness, suspicious skin lesions, transient blindness, difficulty walking, depression, unusual weight change, abnormal bleeding, enlarged lymph nodes, angioedema, breast masses, and testicular masses.    Physical Exam  General:  Well-developed,well-nourished,in no acute distress; alert,appropriate and cooperative throughout examination Head:  Normocephalic and atraumatic without obvious abnormalities. No apparent alopecia or balding. Eyes:  No corneal or conjunctival inflammation noted. EOMI. Perrla. Funduscopic exam benign, without hemorrhages, exudates or papilledema. Vision grossly normal. Ears:  External ear exam shows no  significant lesions or deformities.  Otoscopic examination reveals clear canals, tympanic membranes are intact bilaterally without bulging, retraction, inflammation or discharge. Hearing is grossly normal bilaterally. Nose:  swollen boggy pale nasal mucosa with clear drainage Mouth:  small lesions bilateral stomatitis Lungs:  Normal respiratory effort, chest expands symmetrically. Lungs are clear to auscultation, no crackles or wheezes. Heart:  Normal rate and regular rhythm. S1 and S2 normal without gallop, murmur, click, rub or other extra sounds. Abdomen:  Bowel sounds positive,abdomen soft and non-tender without masses, organomegaly or hernias noted. Extremities:  No clubbing, cyanosis, edema, or deformity noted with normal full range of motion of all joints.     Impression & Recommendations:  Problem # 1:  COPD (ICD-496) Assessment Improved continue advair 250/50 1 inhalation two times a day has improved greatly on advair  Problem # 2:  HYPERTENSION (ICD-401.9) Assessment: Deteriorated  The following medications were removed from the medication list:    Lisinopril 20 Mg Tabs (Lisinopril) .Marland Kitchen... 1 each day for hypertension, high blood pressure    Hydrochlorothiazide 25 Mg Tabs (Hydrochlorothiazide) ..... One q.d. His updated medication list for this problem includes:    Lisinopril-hydrochlorothiazide 10-12.5 Mg Tabs (Lisinopril-hydrochlorothiazide) .Marland Kitchen... 1 each day for blood pressure  Problem # 3:  COUGH (ICD-786.2) Assessment: Improved  Problem # 4:  COSTOCHONDRITIS (ICD-733.6) Assessment: Improved  Problem # 5:  ERECTILE DYSFUNCTION (ICD-302.72) Assessment: Unchanged  Problem # 6:  APHTHOUS STOMATITIS (ICD-528.2)  Magic mouthwash 1 teaspoon q.i.d. [employer] rose while  Orders: Prescription Created Electronically [phone])  Complete Medication List: 1)  Aspirin 81 Mg Tbec (Aspirin) .Marland Kitchen.. 1 qd 2)  Lisinopril-hydrochlorothiazide 10-12.5 Mg Tabs  (Lisinopril-hydrochlorothiazide) .Marland Kitchen.. 1 each day for  blood pressure 3)  Magic Mouth Wash Hc  .Marland Kitchen.. 1 tsp in mouth, rinse garle and swallow qid until well  Patient Instructions: 1)  Changed blood pressure medicine  , lisinopril HCTZ 20-12.5 2)  sent in prescription for sore mouth, stomatitis 3)  when mouth is well restart advair 1 inhalation AM and PM 4)  rinse mouth with water after using inhaler 5)  Lungs are much better today than last visit Prescriptions: MAGIC MOUTH WASH HC 1 tsp in mouth, rinse garle and swallow qid until well  #240 cc x 1   Entered and Authorized by:   Judithann Sheen MD   Signed by:   Judithann Sheen MD on 05/24/2009   Method used:   Print then Give to Patient   RxID:   [id number] LISINOPRIL-HYDROCHLOROTHIAZIDE 10-12.5 MG TABS (LISINOPRIL-HYDROCHLOROTHIAZIDE) 1 each day for blood pressure  #30 x 11   Entered and Authorized by:   Judithann Sheen MD   Signed by:   Judithann Sheen MD on 05/24/2009   Method used:   Electronically to        [facility]. # [id number]* (retail)       [address]       [place], [place]  [address]       Ph: [phone] or [phone]       Fax: [phone]   RxID:   [id number]

## 2010-03-12 NOTE — Assessment & Plan Note (Signed)
Summary: 2 wk rov/njr   Vital Signs:  Patient profile:   70 year old male Height:      69.5 inches (176.53 cm) Weight:      200 pounds (90.91 kg) O2 Sat:      98 % on Room air Temp:     98.3 degrees F (36.83 degrees C) oral Pulse rate:   87 / minute BP sitting:   164 / 88  (left arm) Cuff size:   large  Vitals Entered By: Josph Macho RMA (November 05, 2009 10:09 AM)  O2 Flow:  Room air CC: 2 week follow up/ CF Is Patient Diabetic? No   History of Present Illness: Patient is in for reevaluation of his blood pressure abdominal pain. He does feel somewhat improved since his last visit he admitted he is is helping his reflux 2 of the 3 he takes the medication twice daily most days and only occasionally has breakthrough heartburn at night now. No vomiting although there is still some epigastric discomfort intermittent nausea. He is moving his bowels comfortably no diarrhea no bloody or tarry stool. He does note some urinary frequency, urgency and dysuria. Denies abdominal or back pain in the lower areas. Denies chest pain, palpitations, shortness of breath, headaches. He continues to smoke and drink coffee although he has been coughing often 2-3 cups b.i.d. for one to 2 cups most mornings  Preventive Screening-Counseling & Management  Alcohol-Tobacco     Smoking Cessation Counseling: YES  Current Medications (verified): 1)  Aspirin 81 Mg Tbec (Aspirin) .Marland Kitchen.. 1 Qd 2)  Magic Mouth Wash Hc .Marland Kitchen.. 1 Tsp in Mouth, Rinse Garle and Swallow Qid Until Well 3)  Toprol Xl 25 Mg Xr24h-Tab (Metoprolol Succinate) .Marland Kitchen.. 1 Tab By Mouth Qd 4)  Ranitidine Hcl 150 Mg Tabs (Ranitidine Hcl) .Marland Kitchen.. 1 Tab By Mouth Two Times A Day As Needed Reflux 5)  Tramadol Hcl 50 Mg Tabs (Tramadol Hcl) .Marland Kitchen.. 1 Tab By Mouth Three Times A Day As Needed Pain  Allergies (verified): No Known Drug Allergies  Past History:  Past medical history reviewed for relevance to current acute and chronic problems. Social history  (including risk factors) reviewed for relevance to current acute and chronic problems.  Past Medical History: Reviewed history from 05/10/2009 and no changes required. hypertension  Social History: Reviewed history and no changes required.  Review of Systems      See HPI  Physical Exam  General:  Well-developed,well-nourished,in no acute distress; alert,appropriate and cooperative throughout examination Head:  Normocephalic and atraumatic without obvious abnormalities. No apparent alopecia or balding. Mouth:  Oral mucosa and oropharynx without lesions or exudates.  Teeth in good repair. Neck:  No deformities, masses, or tenderness noted. Lungs:  Normal respiratory effort, chest expands symmetrically. Lungs are clear to auscultation, no crackles or wheezes. Heart:  Normal rate and regular rhythm. S1 and S2 normal without gallop, murmur, click, rub or other extra sounds. Abdomen:  Bowel sounds positive,abdomen soft and non-tender without masses, organomegaly or hernias noted. Extremities:  No clubbing, cyanosis, edema, or deformity noted    Psych:  Cognition and judgment appear intact. Alert and cooperative with normal attention span and concentration. No apparent delusions, illusions, hallucinations   Impression & Recommendations:  Problem # 1:  DYSURIA (ICD-788.1)  Orders: UA Dipstick w/o Micro (automated)  (81003) Venipuncture (66440) Specimen Handling (34742) TLB-CBC Platelet - w/Differential (85025-CBCD) UA unremarkable and wbc now returned to normal. maintain adequate hydration minimize caffeine and f/u with urology next month as  scheduled  Problem # 2:  TOBACCO USER (ICD-305.1)  Encouraged complete cessation once again, patient continues to hesitate. Warned that it contributes to a myriad of health problems including reflux  Orders: Tobacco use cessation intermediate 3-10 minutes ([address])  Problem # 3:  HYPERTENSION (ICD-401.9)  The following medications were removed  from the medication list:    Toprol Xl 25 Mg Xr24h-tab (Metoprolol succinate) .Marland Kitchen... 1 tab by mouth qd His updated medication list for this problem includes:    Toprol Xl 50 Mg Xr24h-tab (Metoprolol succinate) .Marland Kitchen... 1 tab by mouth daily Call if bp remains elevated at home, avoid sodium  Problem # 4:  GERD (ICD-530.81)  His updated medication list for this problem includes:    Ranitidine Hcl 150 Mg Tabs (Ranitidine hcl) .Marland Kitchen... 1 tab by mouth two times a day as needed reflux quit smoking, avoid offending foods, may use Mylanta as needed, offered referral for upper endoscopy today and he declined  Problem # 5:  LOW BACK PAIN, ACUTE (ICD-724.2)  His updated medication list for this problem includes:    Aspirin 81 Mg Tbec (Aspirin) .Marland Kitchen... 1 qd    Tramadol Hcl 50 Mg Tabs (Tramadol hcl) .Marland Kitchen... 1 tab by mouth three times a day as needed pain Patient aware of xray results, reviewed today, offered ortho referral again today and he declined for now.  Complete Medication List: 1)  Aspirin 81 Mg Tbec (Aspirin) .Marland Kitchen.. 1 qd 2)  Magic Mouth Wash Hc  .Marland Kitchen.. 1 tsp in mouth, rinse garle and swallow qid until well 3)  Ranitidine Hcl 150 Mg Tabs (Ranitidine hcl) .Marland Kitchen.. 1 tab by mouth two times a day as needed reflux 4)  Tramadol Hcl 50 Mg Tabs (Tramadol hcl) .Marland Kitchen.. 1 tab by mouth three times a day as needed pain 5)  Toprol Xl 50 Mg Xr24h-tab (Metoprolol succinate) .Marland Kitchen.. 1 tab by mouth daily  Patient Instructions: 1)  Please schedule a follow-up appointment in 1 month.  2)  Limit your Sodium(salt) .  3)  Tobacco is very bad for your health and your loved ones ! You should stop smoking !  4)  Stop smoking tips: Choose a quit date. Cut down before the quit date. Decide what you will do as a substitute when you feel the urge to smoke(gum, toothpick, exercise).  5)  Avoid fatty and spicy foods 6)  Minimize caffeine Prescriptions: TOPROL XL 50 MG XR24H-TAB (METOPROLOL SUCCINATE) 1 tab by mouth daily  #30 x 2   Entered  and Authorized by:   Danise Edge MD   Signed by:   Danise Edge MD on 11/05/2009   Method used:   Electronically to        [facility]  [address] # [id number]* (retail)       [address]       [place], [place]  [address]       Ph: [phone] or [phone]       Fax: [phone]   RxID:   [id number]   Laboratory Results   Urine Tests    Routine Urinalysis   Color: yellow Appearance: Clear Glucose: negative   (Normal Range: Negative) Bilirubin: negative   (Normal Range: Negative) Ketone: negative   (Normal Range: Negative) Spec. Gravity: 1.015   (Normal Range: 1.003-1.035) Blood: negative   (Normal Range: Negative) pH: 5.0   (Normal Range: 5.0-8.0) Protein: negative   (Normal Range: Negative) Urobilinogen: 0.2   (Normal Range: 0-1) Nitrite: negative   (  Normal Range: Negative) Leukocyte Esterace: negative   (Normal Range: Negative)    Comments: Rita Ohara  November 05, 2009 11:51 AM

## 2010-03-12 NOTE — Assessment & Plan Note (Signed)
Summary: 6 WK ROV/NJR   Vital Signs:  Patient profile:   70 year old male Weight:      204 pounds O2 Sat:      97 % Pulse rate:   105 / minute BP sitting:   186 / 90  (left arm) Cuff size:   regular  Vitals Entered By: Pura Spice, RN (April 19, 2009 10:59 AM) CC: 6 week follow up stated he does not take lisinopril  every day and arthortec did not help.  Is Patient Diabetic? No   History of Present Illness: Placenta 79-year-old white male who is in to evaluate his blood pressure since she has not been taking his lisinopril as directed. Blood pressure lwas186/90 There was 160/90 however continues to be elevated. he also relates he has stopped the Arthrotec because he continues to have his arthritic pain and it did not help, this is in regard to his costochondritis of the left chest No other complaints  Allergies (verified): No Known Drug Allergies  Review of Systems  The patient denies anorexia, fever, weight loss, weight gain, vision loss, decreased hearing, hoarseness, chest pain, syncope, dyspnea on exertion, peripheral edema, prolonged cough, headaches, hemoptysis, abdominal pain, melena, hematochezia, severe indigestion/heartburn, hematuria, incontinence, genital sores, muscle weakness, suspicious skin lesions, transient blindness, difficulty walking, depression, unusual weight change, abnormal bleeding, enlarged lymph nodes, angioedema, breast masses, and testicular masses.    Physical Exam  General:  Well-developed,well-nourished,in no acute distress; alert,appropriate and cooperative throughout examination Head:  Normocephalic and atraumatic without obvious abnormalities. No apparent alopecia or balding. Eyes:  No corneal or conjunctival inflammation noted. EOMI. Perrla. Funduscopic exam benign, without hemorrhages, exudates or papilledema. Vision grossly normal. Ears:  External ear exam shows no significant lesions or deformities.  Otoscopic examination reveals clear  canals, tympanic membranes are intact bilaterally without bulging, retraction, inflammation or discharge. Hearing is grossly normal bilaterally. Nose:  NASAL CONGESTION MILD Mouth:  Oral mucosa and oropharynx without lesions or exudates.  Teeth in good repair. Chest Wall:  COSTOCHONDRAL TENDERNESS LEFT ANT CHEST Lungs:  Normal respiratory effort, chest expands symmetrically. Lungs are clear to auscultation, no crackles or wheezes. Heart:  Normal rate and regular rhythm. S1 and S2 normal without gallop, murmur, click, rub or other extra sounds. Abdomen:  Bowel sounds positive,abdomen soft and non-tender without masses, organomegaly or hernias noted. Rectal:  not examined Extremities:  No clubbing, cyanosis, edema, or deformity noted with normal full range of motion of all joints.      Impression & Recommendations:  Problem # 1:  COSTOCHONDRITIS (ICD-733.6) Assessment Deteriorated  Depomedrol 120 mg IM  Orders: Depo- Medrol 80mg  (J1040) Depo- Medrol 40mg  (J1030) Admin of Therapeutic Inj  intramuscular or subcutaneous ([address])  Problem # 2:  HYPERTENSION (ICD-401.9) Assessment: Deteriorated  His updated medication list for this problem includes:    Lisinopril 20 Mg Tabs (Lisinopril) .Marland Kitchen... 1 each day for hypertension, high blood pressure hAS NOT BEEN TAKING TAB, TAKE AT HS  Problem # 3:  ERECTILE DYSFUNCTION (ICD-302.72) Assessment: Unchanged  Problem # 4:  COUGH (ICD-786.2) Assessment: Unchanged  Complete Medication List: 1)  Lisinopril 20 Mg Tabs (Lisinopril) .Marland Kitchen.. 1 each day for hypertension, high blood pressure 2)  Aspirin 81 Mg Tbec (Aspirin) .Marland Kitchen.. 1 qd  Patient Instructions: 1)  Costochondritis 2)  depomedrol 120 mg IM  3)  unable to explain pain upper abdomen when coughs 4)  tx cstochondritis 5)  RETURN 2 WEEKS FOR APPT 6)  TO DO XRAY ABDOMEN IF NO  BETTER 7)  restart lisinopril for blood pressure and return in 6 weeks for recheck   Medication  Administration  Injection # 1:    Medication: Depo- Medrol 80mg     Diagnosis: COSTOCHONDRITIS (ICD-733.6)    Route: IM    Site: LUOQ gluteus    Exp Date: 12/2009    Lot #: [id number]    Mfr: [employer]    Patient tolerated injection without complications    Given by: Judithann Sheen MD (April 19, 2009 12:23 PM)  Injection # 2:    Medication: Depo- Medrol 40mg     Diagnosis: COSTOCHONDRITIS (ICD-733.6)    Route: IM    Site: LUOQ gluteus    Exp Date: 12/2009    Lot #: [id number]    Mfr: [employer]    Patient tolerated injection without complications    Given by: Judithann Sheen MD (April 19, 2009 12:23 PM)  Orders Added: 1)  Depo- Medrol 80mg  [J1040] 2)  Depo- Medrol 40mg  [J1030] 3)  Admin of Therapeutic Inj  intramuscular or subcutaneous [96372] 4)  Est. Patient Level IV [[address]]

## 2010-03-14 NOTE — Assessment & Plan Note (Signed)
Summary: 1 month rov/njr   Vital Signs:  Patient profile:   70 year old male Weight:      184 pounds Temp:     98.2 degrees F oral Pulse rate:   114 / minute Pulse rhythm:   regular BP sitting:   140 / 74  (left arm) Cuff size:   regular  Vitals Entered By: Alfred Levins, CMA (February 21, 2010 2:00 PM) CC: bp check   History of Present Illness: This 70year-old white single male is in for follow up of his blood pressure which had been elevated and also in regard to low back pain with radiation to right leg to his foot. his indigestion is controlled with ranitidine most of the time but occasionally has to use TUMS blood pressure today 140/74 Refused a flu injection  Current Medications (verified): 1)  Aspirin 81 Mg Tbec (Aspirin) .Marland Kitchen.. 1 Qd 2)  Ranitidine Hcl 150 Mg Tabs (Ranitidine Hcl) .Marland Kitchen.. 1 Tab By Mouth Two Times A Day As Needed Reflux 3)  Amlodipine Besylate 5 Mg Tabs (Amlodipine Besylate) .Marland Kitchen.. 1 Once Daily For High Blood Pressure  Allergies (verified): No Known Drug Allergies  Past History:  Risk Factors: Smoking Status: current (05/10/2009) Packs/Day: 1.0 (05/10/2009)  Past Medical History: hypertension GERD colon polyps  Past Surgical History: lumar laminectomy 1974  Review of Systems      See HPI  The patient denies anorexia, fever, weight loss, weight gain, vision loss, decreased hearing, hoarseness, chest pain, syncope, dyspnea on exertion, peripheral edema, prolonged cough, headaches, hemoptysis, abdominal pain, melena, hematochezia, severe indigestion/heartburn, hematuria, incontinence, genital sores, muscle weakness, suspicious skin lesions, transient blindness, difficulty walking, depression, unusual weight change, abnormal bleeding, enlarged lymph nodes, angioedema, breast masses, and testicular masses.    Physical Exam  General:  Well-developed,well-nourished,in no acute distress; alert,appropriate and cooperative throughout examination Lungs:  Normal  respiratory effort, chest expands symmetrically. Lungs are clear to auscultation, no crackles or wheezes. Heart:  Normal rate and regular rhythm. S1 and S2 normal without gallop, murmur, click, rub or other extra sounds. Msk:  tender over the right SI joint no limitation of straight leg raising Extremities:  No clubbing, cyanosis, edema, or deformity noted with normal full range of motion of all joints.   Neurologic:  No cranial nerve deficits noted. Station and gait are normal. Plantar reflexes are down-going bilaterally. DTRs are symmetrical throughout. Sensory, motor and coordinative functions appear intact.   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 1 qd    Aleve 220 Mg Tabs (Naproxen sodium) .Marland Kitchen... 2 once daily for arthritis  Orders: Admin of Therapeutic Inj  intramuscular or subcutaneous (04540) Depo- Medrol 80mg  (J1040)  Problem # 2:  LUMBOSACRAL STRAIN (ICD-846.0) Assessment: Deteriorated  Problem # 3:  GERD (ICD-530.81) Assessment: Improved  His updated medication list for this problem includes:    Ranitidine Hcl 150 Mg Tabs (Ranitidine hcl) .Marland Kitchen... 1 tab by mouth two times a day as needed reflux  Problem # 4:  TOBACCO USER (ICD-305.1) Assessment: Unchanged  Problem # 5:  COPD (ICD-496) Assessment: Unchanged  Problem # 6:  HYPERTENSION (ICD-401.9) Assessment: Improved  The following medications were removed from the medication list:    Toprol Xl 100 Mg Xr24h-tab (Metoprolol succinate) .Marland Kitchen... 1 tab by mouth daily His updated medication list for this problem includes:    Amlodipine Besylate 5 Mg Tabs (  Amlodipine besylate) .Marland Kitchen... 1 once daily for high blood pressure  Complete Medication List: 1)  Aspirin 81 Mg Tbec (Aspirin) .Marland Kitchen.. 1 qd 2)  Ranitidine Hcl 150 Mg Tabs (Ranitidine hcl) .Marland Kitchen.. 1 tab by mouth two times a day as needed reflux 3)   Amlodipine Besylate 5 Mg Tabs (Amlodipine besylate) .Marland Kitchen.. 1 once daily for high blood pressure 4)  Aleve 220 Mg Tabs (Naproxen sodium) .... 2 once daily for arthritis  Patient Instructions: 1)  SI strain with radiating pain rt leg 2)  Depomedrol helps, gave 120mg  Depomedrol 3)  Blood pressure has improved 4)  continue other medicines 5)  head Aleve 200 mg tab 2 q.d.   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 Patient Level IV [40981]

## 2010-03-14 NOTE — Assessment & Plan Note (Signed)
Summary: 1 month rov/njr   Vital Signs:  Patient profile:   70 year old male Weight:      201 pounds Temp:     98.9 degrees F oral Pulse rate:   77 / minute Pulse rhythm:   regular BP sitting:   170 / 92  (left arm) Cuff size:   regular  Vitals Entered By: Alfred Levins, CMA (January 17, 2010 4:29 PM) CC: f/u, metoprolol makes his vision blurry and h/a   History of Present Illness: De[place] 8-year-old white male he is in today for followup treatment of his hypertension as well as complaining of the Toprol causing considerable lethargy and desirous of changing medication Blood pressure 170/92 today, will change medication to amlodipine but were reduced the Toprol gradually Her is controlled with ranitidine Continues to take aspirin 81 mg each day after a meal Continues to have a right pelvis function but does not want any treatment at this time complaining of low back pain pain radiation left lower leg  Current Medications (verified): 1)  Aspirin 81 Mg Tbec (Aspirin) .Marland Kitchen.. 1 Qd 2)  Ranitidine Hcl 150 Mg Tabs (Ranitidine Hcl) .Marland Kitchen.. 1 Tab By Mouth Two Times A Day As Needed Reflux 3)  Toprol Xl 100 Mg Xr24h-Tab (Metoprolol Succinate) .Marland Kitchen.. 1 Tab By Mouth Daily  Allergies (verified): No Known Drug Allergies  Past History:  Risk Factors: Smoking Status: current (05/10/2009) Packs/Day: 1.0 (05/10/2009)  Past Medical History: hypertension GERD  Review of Systems      See HPI CV:  See HPI; Complains of fatigue; lethargy secondary to Toprol, to change medication.  Physical Exam  General:  Well-developed,well-nourished,in no acute distress; alert,appropriate and cooperative throughout examination Neck:  No deformities, masses, or tenderness noted. Chest Wall:  No deformities, masses, tenderness or gynecomastia noted. Lungs:  Normal respiratory effort, chest expands symmetrically. Lungs are clear to auscultation, no crackles or wheezes. Heart:  Normal rate and regular  rhythm. S1 and S2 normal without gallop, murmur, click, rub or other extra sounds. Msk:  tenderness over lumbosacral spine and especially left SI joint   Impression & Recommendations:  Problem # 1:  LUMBOSACRAL STRAIN (ICD-846.0) Assessment New Depo-Medrol 180 mg IM  Problem # 2:  LETHARGY (ICD-780.79) Assessment: New secondary to Toprol, to decrease dosage and changed to amlodipine  Problem # 3:  GERD (ICD-530.81) Assessment: Improved  His updated medication list for this problem includes:    Ranitidine Hcl 150 Mg Tabs (Ranitidine hcl) .Marland Kitchen... 1 tab by mouth two times a day as needed reflux  Problem # 4:  TOBACCO USER (ICD-305.1) Assessment: Unchanged  Problem # 5:  HYPERTENSION (ICD-401.9) Assessment: Deteriorated  His updated medication list for this problem includes:    Toprol Xl 100 Mg Xr24h-tab (Metoprolol succinate) .Marland Kitchen... 1 tab by mouth daily    Amlodipine Besylate 5 Mg Tabs (Amlodipine besylate) .Marland Kitchen... 1 once daily for high blood pressure  Complete Medication List: 1)  Aspirin 81 Mg Tbec (Aspirin) .Marland Kitchen.. 1 qd 2)  Ranitidine Hcl 150 Mg Tabs (Ranitidine hcl) .Marland Kitchen.. 1 tab by mouth two times a day as needed reflux 3)  Toprol Xl 100 Mg Xr24h-tab (Metoprolol succinate) .Marland Kitchen.. 1 tab by mouth daily 4)  Amlodipine Besylate 5 Mg Tabs (Amlodipine besylate) .Marland Kitchen.. 1 once daily for high blood pressure  Other Orders: Prescription Created Electronically [id number])  Patient Instructions: 1)  TAKE 1/2 TOPROL TAB EACH NIGHT 2)  NEW MWDICINE QMLODIPINE 5 MG 1 EACH MORNING 3)  KEEP AALT INTAKE LOW  4)  INJECTION DEPOMEDROL 120 MG IM FOR SACRO ILIAC STRAIN AND ARTHRITIS 5)  RETURN 1 MONTH FOR BLOOD PRESSURE AND FOLLOW UP Prescriptions: AMLODIPINE BESYLATE 5 MG TABS (AMLODIPINE BESYLATE) 1 once daily for high blood pressure  #30 x 11   Entered and Authorized by:   Judithann Sheen MD   Signed by:   Judithann Sheen MD on 01/17/2010   Method used:   Electronically to        [facility] # [id number]* (retail)       [address]       [place], [place]  [address]       Ph: [phone] or [phone]       Fax: [phone]   RxID:   [id number]    Orders Added: 1)  Prescription Created Electronically [[id number]] 2)  Est. Patient Level IV [27253]

## 2010-04-25 ENCOUNTER — Encounter: Payer: Self-pay | Admitting: Family Medicine

## 2010-10-08 ENCOUNTER — Encounter: Payer: Self-pay | Admitting: Family Medicine

## 2010-10-08 ENCOUNTER — Ambulatory Visit (INDEPENDENT_AMBULATORY_CARE_PROVIDER_SITE_OTHER): Payer: Medicare Other | Admitting: Family Medicine

## 2010-10-08 VITALS — BP 142/82 | HR 101 | Temp 98.5°F | Wt 196.0 lb

## 2010-10-08 DIAGNOSIS — M461 Sacroiliitis, not elsewhere classified: Secondary | ICD-10-CM

## 2010-10-08 DIAGNOSIS — D649 Anemia, unspecified: Secondary | ICD-10-CM

## 2010-10-08 DIAGNOSIS — R0602 Shortness of breath: Secondary | ICD-10-CM

## 2010-10-08 DIAGNOSIS — J310 Chronic rhinitis: Secondary | ICD-10-CM

## 2010-10-08 DIAGNOSIS — R079 Chest pain, unspecified: Secondary | ICD-10-CM

## 2010-10-08 DIAGNOSIS — I10 Essential (primary) hypertension: Secondary | ICD-10-CM

## 2010-10-08 LAB — BASIC METABOLIC PANEL
BUN: 14 mg/dL (ref 6–23)
CO2: 25 mEq/L (ref 19–32)
Chloride: 105 mEq/L (ref 96–112)
GFR: 128.17 mL/min (ref >60.00)
Glucose, Bld: 103 mg/dL — ABNORMAL HIGH (ref 70–99)
Potassium: 3.7 mEq/L (ref 3.5–5.1)

## 2010-10-08 LAB — CBC WITH DIFFERENTIAL/PLATELET
Basophils Absolute: 0 10*3/uL (ref 0.0–0.1)
Eosinophils Absolute: 0.1 10*3/uL (ref 0.0–0.7)
HCT: 45.1 % (ref 39.0–52.0)
Hemoglobin: 15.1 g/dL (ref 13.0–17.0)
Lymphs Abs: 2.9 10*3/uL (ref 0.7–4.0)
MCHC: 33.5 g/dL (ref 30.0–36.0)
MCV: 91.5 fl (ref 78.0–100.0)
Monocytes Absolute: 1.4 10*3/uL — ABNORMAL HIGH (ref 0.1–1.0)
Neutro Abs: 7.6 10*3/uL (ref 1.4–7.7)
Platelets: 317 10*3/uL (ref 150.0–400.0)
RDW: 15 % — ABNORMAL HIGH (ref 11.5–14.6)

## 2010-10-08 MED ORDER — DICLOFENAC SODIUM 75 MG PO TBEC: 75.0000 mg | DELAYED_RELEASE_TABLET | Freq: Two times a day (BID) | ORAL | Status: DC

## 2010-10-08 MED ORDER — FLUTICASONE PROPIONATE 50 MCG/ACT NA SUSP: 2.0000 | Freq: Every day | NASAL | Status: DC

## 2010-10-08 MED ORDER — AMLODIPINE BESYLATE 5 MG PO TABS: ORAL_TABLET | ORAL | Status: DC

## 2010-10-08 MED ORDER — METHYLPREDNISOLONE ACETATE 80 MG/ML IJ SUSP
120.0000 mg | Freq: Once | INTRAMUSCULAR | Status: AC
  Administered 2010-10-08: 120 mg via INTRAMUSCULAR

## 2010-10-09 ENCOUNTER — Telehealth: Payer: Self-pay

## 2010-10-09 NOTE — Progress Notes (Signed)
Pt came to office and got lab results.

## 2010-10-09 NOTE — Telephone Encounter (Signed)
Pt aware.

## 2010-10-09 NOTE — Telephone Encounter (Signed)
Called to give pt lab results.   

## 2010-10-14 ENCOUNTER — Encounter: Payer: Self-pay | Admitting: Family Medicine

## 2010-10-14 NOTE — Progress Notes (Signed)
  Subjective:    Patient ID: Ian Gonzales, male    DOB: 09-02-1940, 70 y.o.   MRN: [id number]  HPI    Review of Systems     Objective:   Physical Exam Electrocardiogram revealed sinus rhythm with right bundle branch block no acute evidence of ischemia nor arrhythmia       Assessment & Plan:

## 2010-10-14 NOTE — Progress Notes (Signed)
  Subjective:    P This is a[age]-year-old white male is in today complaining of possible elevation of blood pressure which today is 142/82 today to use it is 132 he complains of some shortness of breath and part of the shortness of breath has nasal congestion but he also has low-grade COPD not to severe enough to use inhalers. Continues to smoke  ID: Ian Gonzales, male    DOB: [birth year], 70 y.o.   MRN: [id number] his other primary complaining of pain in his back on the left side radiating anteriorly into the ankle region so complains of occasional episodes of dizziness which is infrequent Has chronic history of GERD and indigestion which is relieved with ranitidine 150 mg each day  HPI    Review of Systems see history of present illness     Objective:   Physical Exam the patient is a well-built well-nourished white male in no distress HEENT reveals nasal congestion bilaterally pharynx is clear slight postnasal drainage, no nystagmus indicating no labyrinthitis Lung examination reveals decreased L. breath sounds no rales no wheezing Chest wall has slight tenderness left chest costochondral rib 7 through 10 abdominal exam l  negative no tenderness liver spleen kidneys are nonpalpable Examination of the back reveals tenderness over the left sacroiliac joint and all pressure he relates it refers pain to the left inguinal region       Assessment & Plan:  Sacroiliac inflammation and costochondritis to be treated with diclofenac 75 mg twice a day COPD recommend smoking cessation Allergic rhinitis Flonase nasal spray 2 sprays each day Hypertension controlled with amlodipine 5 mg each day

## 2010-10-14 NOTE — Patient Instructions (Signed)
You have costochondritis left chest as well as sacroiliac inflammation left prescribe diclofenac 75 mg twice a day GERD and indigestion continue ranitidined 50 mg daily Nasal congestion Flonase nasal spray 2 sprays each nostril daily Shortness of breath breath recommend smoking cessation as well as the Flonase will help

## 2011-03-21 ENCOUNTER — Other Ambulatory Visit: Payer: Self-pay | Admitting: Family Medicine

## 2011-03-21 ENCOUNTER — Ambulatory Visit (INDEPENDENT_AMBULATORY_CARE_PROVIDER_SITE_OTHER): Payer: Medicare Other | Admitting: Family Medicine

## 2011-03-21 ENCOUNTER — Encounter: Payer: Self-pay | Admitting: Family Medicine

## 2011-03-21 DIAGNOSIS — R079 Chest pain, unspecified: Secondary | ICD-10-CM

## 2011-03-21 DIAGNOSIS — J449 Chronic obstructive pulmonary disease, unspecified: Secondary | ICD-10-CM

## 2011-03-21 DIAGNOSIS — K635 Polyp of colon: Secondary | ICD-10-CM

## 2011-03-21 DIAGNOSIS — I10 Essential (primary) hypertension: Secondary | ICD-10-CM

## 2011-03-21 DIAGNOSIS — Z0000 Encounter for general adult medical examination without abnormal findings: Secondary | ICD-10-CM

## 2011-03-21 DIAGNOSIS — M545 Low back pain: Secondary | ICD-10-CM | POA: Insufficient documentation

## 2011-03-21 DIAGNOSIS — D126 Benign neoplasm of colon, unspecified: Secondary | ICD-10-CM

## 2011-03-21 DIAGNOSIS — K219 Gastro-esophageal reflux disease without esophagitis: Secondary | ICD-10-CM

## 2011-03-21 DIAGNOSIS — J329 Chronic sinusitis, unspecified: Secondary | ICD-10-CM

## 2011-03-21 DIAGNOSIS — D649 Anemia, unspecified: Secondary | ICD-10-CM

## 2011-03-21 DIAGNOSIS — J019 Acute sinusitis, unspecified: Secondary | ICD-10-CM

## 2011-03-21 DIAGNOSIS — F17200 Nicotine dependence, unspecified, uncomplicated: Secondary | ICD-10-CM

## 2011-03-21 DIAGNOSIS — B029 Zoster without complications: Secondary | ICD-10-CM

## 2011-03-21 LAB — POCT URINALYSIS DIPSTICK
Blood, UA: NEGATIVE
Leukocytes, UA: NEGATIVE
Nitrite, UA: NEGATIVE
Protein, UA: NEGATIVE
pH, UA: 5

## 2011-03-21 MED ORDER — AMOXICILLIN-POT CLAVULANATE 875-125 MG PO TABS: 1.0000 | ORAL_TABLET | Freq: Two times a day (BID) | ORAL | Status: AC

## 2011-03-21 MED ORDER — RANITIDINE HCL 150 MG PO TABS: 150.0000 mg | ORAL_TABLET | Freq: Every day | ORAL | Status: DC

## 2011-03-21 MED ORDER — GUAIFENESIN ER 600 MG PO TB12: 600.0000 mg | ORAL_TABLET | Freq: Two times a day (BID) | ORAL | Status: DC

## 2011-03-21 NOTE — Patient Instructions (Signed)

## 2011-03-22 LAB — LIPID PANEL
Cholesterol: 231 mg/dL — ABNORMAL HIGH (ref 0–200)
Triglycerides: 96 mg/dL (ref <150)
VLDL: 19 mg/dL (ref 0–40)

## 2011-03-22 LAB — BASIC METABOLIC PANEL
BUN: 18 mg/dL (ref 6–23)
Calcium: 10 mg/dL (ref 8.4–10.5)
Glucose, Bld: 95 mg/dL (ref 70–99)
Potassium: 4.3 mEq/L (ref 3.5–5.3)
Sodium: 141 mEq/L (ref 135–145)

## 2011-03-22 LAB — TSH: TSH: 1.499 u[IU]/mL (ref 0.350–4.500)

## 2011-03-22 LAB — HEPATIC FUNCTION PANEL
Alkaline Phosphatase: 111 U/L (ref 39–117)
Bilirubin, Direct: 0.1 mg/dL (ref 0.0–0.3)
Indirect Bilirubin: 0.3 mg/dL (ref 0.0–0.9)

## 2011-03-22 LAB — CBC
Hemoglobin: 15.2 g/dL (ref 13.0–17.0)
MCH: 31.9 pg (ref 26.0–34.0)
MCHC: 34.1 g/dL (ref 30.0–36.0)
RDW: 14.1 % (ref 11.5–15.5)

## 2011-03-24 ENCOUNTER — Encounter: Payer: Self-pay | Admitting: Family Medicine

## 2011-03-24 DIAGNOSIS — J019 Acute sinusitis, unspecified: Secondary | ICD-10-CM

## 2011-03-24 HISTORY — DX: Acute sinusitis, unspecified: J01.90

## 2011-03-24 NOTE — Assessment & Plan Note (Signed)
Started on Ranitidine and avoid offending foods

## 2011-03-24 NOTE — Progress Notes (Signed)
Patient ID: Ian Gonzales, male   DOB: [birth year], 71 y.o.   MRN: [id number] Ian Gonzales [id number] Oct 14, 1940 03/24/2011      Progress Note New Patient  Subjective  Chief Complaint  Chief Complaint  Patient presents with  . Establish Care    new patient  . chest congestion    X 1 month    HPI  Patient is a 71 year old Caucasian male who is in today for new patient appointment. 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 Scotty Court is retired. He is noting a one month history of persistent and terrible respiratory symptoms. He is complaining of chest congestion, head congestion, sneezing, rhinorrhea productive of thick Rosalyn Gess has bloody sputum. He denies any fevers chills but does have some malaise and myalgias. Denies any chest pain or palpitations, shortness of breath. He does intermittently with some low-grade constipation back pain but this is not notably worse. He does follow with urology for some urethral strictures and has had a pump placed for EGD as well. He is complaining of some burning in his left groin where he previously had shingles infection but he denies any rash, itching or other complaints at this time.  Past Medical History  Diagnosis Date  . Hypertension   . GERD (gastroesophageal reflux disease)   . Colon polyps   . Shingles 05-12-2010  . Back pain, lumbosacral   . Preventative health care 03/21/2011  . Sinusitis acute 03/24/2011    Past Surgical History  Procedure Date  . Lumbar laminectomy   . Urethral stricture dilatation     No family history on file.  History   Social History  . Marital Status: Single    Spouse Name: N/A    Number of Children: N/A  . Years of Education: N/A   Occupational History  . Not on file.   Social History Main Topics  . Smoking status: Current Everyday Smoker -- 1.0 packs/day    Types: Cigarettes  . Smokeless tobacco: Never Used  . Alcohol Use: Yes     once in a while   . Drug Use: No  . Sexually Active: No    Other Topics Concern  . Not on file   Social History Narrative  . No narrative on file    Current Outpatient Prescriptions on File Prior to Visit  Medication Sig Dispense Refill  . amLODipine (NORVASC) 5 MG tablet 1 tab qd for high  Blood pressure  30 tablet  11  . aspirin 81 MG tablet Take 81 mg by mouth daily.        . diclofenac (VOLTAREN) 75 MG EC tablet Take 1 tablet (75 mg total) by mouth 2 (two) times daily with a meal. For arthritis  60 tablet  11  . fluticasone (FLONASE) 50 MCG/ACT nasal spray Place 2 sprays into the nose daily. For nasal congestion  16 g  11    No Known Allergies  Review of Systems  Review of Systems  Constitutional: Negative for fever, chills and malaise/fatigue.  HENT: Positive for nosebleeds, congestion and sore throat. Negative for hearing loss.   Eyes: Negative for discharge.  Respiratory: Positive for cough. Negative for shortness of breath and wheezing.   Cardiovascular: Negative for chest pain, palpitations and leg swelling.  Gastrointestinal: Positive for constipation. Negative for heartburn, nausea, vomiting, abdominal pain, diarrhea and blood in stool.  Genitourinary: Negative for dysuria, urgency, frequency and hematuria.  Musculoskeletal: Negative for myalgias, back pain and falls.  Skin: Positive for itching. Negative  for rash.       Burning in groin since shingles outbreak several months ago  Neurological: Negative for dizziness, tremors, sensory change, focal weakness, loss of consciousness, weakness and headaches.  Endo/Heme/Allergies: Negative for polydipsia. Does not bruise/bleed easily.  Psychiatric/Behavioral: Negative for depression and suicidal ideas. The patient is not nervous/anxious and does not have insomnia.     Objective  BP 138/85  Pulse 80  Temp(Src) 98.4 F (36.9 C) (Temporal)  Ht 5' 9.5" (1.765 m)  Wt 196 lb 6.4 oz (89.086 kg)  BMI 28.59 kg/m2  SpO2 96%  Physical Exam  Physical Exam  Constitutional: He is  oriented to person, place, and time and well-developed, well-nourished, and in no distress. No distress.  HENT:  Head: Normocephalic and atraumatic.       Oropharynx erythematous  Eyes: Conjunctivae are normal.  Neck: Neck supple. No thyromegaly present.  Cardiovascular: Normal rate, regular rhythm and normal heart sounds.   No murmur heard. Pulmonary/Chest: Effort normal and breath sounds normal. No respiratory distress.  Abdominal: He exhibits no distension and no mass. There is no tenderness.  Musculoskeletal: He exhibits no edema.  Neurological: He is alert and oriented to person, place, and time.  Skin: Skin is warm.  Psychiatric: Memory, affect and judgment normal.       Assessment & Plan  Sinusitis acute Augmentin and mucinex bid, increase fluids and rest  GERD Started on Ranitidine and avoid offending foods  Back pain, lumbosacral tolerable  Shingles In groin left with some residual discomfort, no active rash  HYPERTENSION Consider DASH diet and continue current medications  TOBACCO USER Encouraged complete cessation  COPD Needs to quit smoking to keep this from progressing

## 2011-03-24 NOTE — Assessment & Plan Note (Addendum)
tolerable

## 2011-03-24 NOTE — Assessment & Plan Note (Signed)
Needs to quit smoking to keep this from progressing

## 2011-03-24 NOTE — Assessment & Plan Note (Signed)
Augmentin and mucinex bid, increase fluids and rest

## 2011-03-24 NOTE — Assessment & Plan Note (Signed)
In groin left with some residual discomfort, no active rash

## 2011-03-24 NOTE — Assessment & Plan Note (Signed)
Encouraged complete cessation. 

## 2011-03-24 NOTE — Assessment & Plan Note (Signed)
Consider DASH diet and continue current medications

## 2011-04-16 ENCOUNTER — Ambulatory Visit (INDEPENDENT_AMBULATORY_CARE_PROVIDER_SITE_OTHER): Payer: Medicare Other | Admitting: Family Medicine

## 2011-04-16 ENCOUNTER — Encounter: Payer: Self-pay | Admitting: Family Medicine

## 2011-04-16 VITALS — BP 144/84 | HR 88 | Temp 98.2°F | Ht 69.5 in | Wt 199.8 lb

## 2011-04-16 DIAGNOSIS — F17200 Nicotine dependence, unspecified, uncomplicated: Secondary | ICD-10-CM

## 2011-04-16 DIAGNOSIS — K219 Gastro-esophageal reflux disease without esophagitis: Secondary | ICD-10-CM

## 2011-04-16 DIAGNOSIS — R1013 Epigastric pain: Secondary | ICD-10-CM

## 2011-04-16 DIAGNOSIS — J019 Acute sinusitis, unspecified: Secondary | ICD-10-CM

## 2011-04-16 DIAGNOSIS — R413 Other amnesia: Secondary | ICD-10-CM | POA: Insufficient documentation

## 2011-04-16 DIAGNOSIS — N400 Enlarged prostate without lower urinary tract symptoms: Secondary | ICD-10-CM

## 2011-04-16 HISTORY — DX: Other amnesia: R41.3

## 2011-04-16 MED ORDER — NICOTINE 10 MG IN INHA: 1.0000 | RESPIRATORY_TRACT | Status: AC | PRN

## 2011-04-16 NOTE — Assessment & Plan Note (Addendum)
Smoking roughly a PPD not been able to quit despite multiple attempts. Chantix gave him hallucination. He agrees to take a prescription for Nicotrol inhaler with him and continue attempts at cessation.

## 2011-04-16 NOTE — Assessment & Plan Note (Signed)
Recently seen by Urology and told he was doing well

## 2011-04-16 NOTE — Progress Notes (Signed)
Patient ID: Ian Gonzales, male   DOB: 1940/10/22, 71 y.o.   MRN: [id number] Ian Gonzales [id number] [birth year] 04/16/2011      Progress Note-Follow Up  Subjective  Chief Complaint  Chief Complaint  Patient presents with  . Follow-up    1 month follow up    HPI  This 71 year old Caucasian male who is in for followup of multiple medical problems. His sinus symptoms are improved although he does still struggle with daily congestion. No fevers or chills. He continues to complain of a burning sensation in his mid stomach and epigastrium. It is mild and not affected by eating. He moves his bowels regularly and denies any blood in her stool. He eats roughly 2 meals a day and does get hungry but says his appetite is slightly decreased from his baseline. He denies dysphasia. Says his heartburn is generally well controlled with the omeprazole. No significant burning. He is an occasional atypical chest the left upper chest wall. He says not burning it's more of a sharp pain lasts an undetermined amount of time it is the decision is being lying down activity, shortness of breath, palpitations, nausea or any other concerning symptoms. He reports he is recently had a cardiac workup his cardiologist to agree to go and was told his heart was okay. He also recently saw urologist and was given a clean bill of health in that regard. No recent fevers, chills or other concerning symptoms noted today.  Past Medical History  Diagnosis Date  . Hypertension   . GERD (gastroesophageal reflux disease)   . Colon polyps   . Shingles 05-12-2010  . Back pain, lumbosacral   . Preventative health care 03/21/2011  . Sinusitis acute 03/24/2011    Past Surgical History  Procedure Date  . Lumbar laminectomy   . Urethral stricture dilatation     History reviewed. No pertinent family history.  History   Social History  . Marital Status: Single    Spouse Name: N/A    Number of Children: N/A  . Years of  Education: N/A   Occupational History  . Not on file.   Social History Main Topics  . Smoking status: Current Everyday Smoker -- 1.0 packs/day    Types: Cigarettes  . Smokeless tobacco: Never Used  . Alcohol Use: Yes     once in a while   . Drug Use: No  . Sexually Active: No   Other Topics Concern  . Not on file   Social History Narrative  . No narrative on file    Current Outpatient Prescriptions on File Prior to Visit  Medication Sig Dispense Refill  . amLODipine (NORVASC) 5 MG tablet 1 tab qd for high  Blood pressure  30 tablet  11  . aspirin 81 MG tablet Take 81 mg by mouth daily.        . diclofenac (VOLTAREN) 75 MG EC tablet Take 1 tablet (75 mg total) by mouth 2 (two) times daily with a meal. For arthritis  60 tablet  11  . omeprazole (PRILOSEC) 40 MG capsule       . PROAIR HFA 108 (90 BASE) MCG/ACT inhaler       . ranitidine (ZANTAC) 150 MG tablet Take 1 tablet (150 mg total) by mouth at bedtime.  30 tablet  5    No Known Allergies  Review of Systems  Review of Systems  Constitutional: Negative for fever and malaise/fatigue.  HENT: Negative for congestion.   Eyes: Negative for  discharge.  Respiratory: Negative for shortness of breath.   Cardiovascular: Positive for chest pain. Negative for palpitations and leg swelling.       He describes an infrequent discomfort in left upper chest wall. Happens every few weeks without any associated symptoms and no pattern as to when it occurs. He also reports that he has recently been worked up by his cardiologist at [facility] and told his heart looks good.  Gastrointestinal: Positive for abdominal pain. Negative for heartburn, nausea and diarrhea.  Genitourinary: Negative for dysuria.  Musculoskeletal: Negative for falls.  Skin: Negative for rash.  Neurological: Negative for loss of consciousness and headaches.  Endo/Heme/Allergies: Negative for polydipsia.  Psychiatric/Behavioral: Negative for  depression and suicidal ideas. The patient is not nervous/anxious and does not have insomnia.     Objective  BP 144/84  Pulse 88  Temp(Src) 98.2 F (36.8 C) (Temporal)  Ht 5' 9.5" (1.765 m)  Wt 199 lb 12.8 oz (90.629 kg)  BMI 29.08 kg/m2  SpO2 97%  Physical Exam  Physical Exam  Constitutional: He is oriented to person, place, and time and well-developed, well-nourished, and in no distress. No distress.  HENT:  Head: Normocephalic and atraumatic.  Eyes: Conjunctivae are normal.  Neck: Neck supple. No thyromegaly present.  Cardiovascular: Normal rate, regular rhythm and normal heart sounds.   No murmur heard. Pulmonary/Chest: Effort normal and breath sounds normal. No respiratory distress.  Abdominal: Soft. Bowel sounds are normal. He exhibits no distension and no mass. There is no tenderness. There is no rebound and no guarding.  Musculoskeletal: He exhibits no edema.  Neurological: He is alert and oriented to person, place, and time.  Skin: Skin is warm.  Psychiatric: Memory, affect and judgment normal.    Lab Results  Component Value Date   TSH 1.499 03/21/2011   Lab Results  Component Value Date   WBC 11.8* 03/21/2011   HGB 15.2 03/21/2011   HCT 44.6 03/21/2011   MCV 93.7 03/21/2011   PLT 349 03/21/2011   Lab Results  Component Value Date   CREATININE 0.83 03/21/2011   BUN 18 03/21/2011   NA 141 03/21/2011   K 4.3 03/21/2011   CL 105 03/21/2011   CO2 24 03/21/2011   Lab Results  Component Value Date   ALT 51 03/21/2011   AST 19 03/21/2011   ALKPHOS 111 03/21/2011   BILITOT 0.4 03/21/2011   Lab Results  Component Value Date   CHOL 231* 03/21/2011   Lab Results  Component Value Date   HDL 42 03/21/2011   Lab Results  Component Value Date   LDLCALC 170* 03/21/2011   Lab Results  Component Value Date   TRIG 96 03/21/2011   Lab Results  Component Value Date   CHOLHDL 5.5 03/21/2011     Assessment & Plan  TOBACCO USER Smoking roughly a PPD not been able to quit despite multiple  attempts. Chantix gave him hallucination. He agrees to take a prescription for Nicotrol inhaler with him and continue attempts at cessation.  Sinusitis acute Improved although he does continue to complain of nasal congestion. 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 He says this is not interfering with his ADLs yet. Will run a MMSE at next visit.

## 2011-04-16 NOTE — Assessment & Plan Note (Signed)
He notes he is getting more forgetful and once again is encouraged to quit smoking. He says this is not interfering with his ADLs yet. Will run a MMSE at next visit.

## 2011-04-16 NOTE — Assessment & Plan Note (Signed)
Overall he feels he is doing well and the Prilosec is helpful, he does c/o epigastric burning will check an H Pylori

## 2011-04-16 NOTE — Patient Instructions (Signed)
Nicotine Addiction Nicotine can act as both a stimulant (excites/activates) and a sedative (calms/quiets). Immediately after exposure to nicotine, there is a "kick" caused in part by the drug's stimulation of the adrenal glands and resulting discharge of adrenaline (epinephrine). The rush of adrenaline stimulates the body and causes a sudden release of sugar. This means that smokers are always slightly hyperglycemic. Hyperglycemic means that the blood sugar is high, just like in diabetics. Nicotine also decreases the amount of insulin which helps control sugar levels in the body. There is an increase in blood pressure, breathing, and the rate of heart beats.  In addition, nicotine indirectly causes a release of dopamine in the brain that controls pleasure and motivation. A similar reaction is seen with other drugs of abuse, such as cocaine and heroin. This dopamine release is thought to cause the pleasurable sensations when smoking. In some different cases, nicotine can also create a calming effect, depending on sensitivity of the smoker's nervous system and the dose of nicotine taken. WHAT HAPPENS WHEN NICOTINE IS TAKEN FOR LONG PERIODS OF TIME?  Long-term use of nicotine results in addiction. It is difficult to stop.   Repeated use of nicotine creates tolerance. Higher doses of nicotine are needed to get the "kick."  When nicotine use is stopped, withdrawal may last a month or more. Withdrawal may begin within a few hours after the last cigarette. Symptoms peak within the first few days and may lessen within a few weeks. For some people, however, symptoms may last for months or longer. Withdrawal symptoms include:   Irritability.   Craving.   Learning and attention deficits.   Sleep disturbances.   Increased appetite.  Craving for tobacco may last for 6 months or longer. Many behaviors done while using nicotine can also play a part in the severity of withdrawal symptoms. For some people, the  feel, smell, and sight of a cigarette and the ritual of obtaining, handling, lighting, and smoking the cigarette are closely linked with the pleasure of smoking. When stopped, they also miss the related behaviors which make the withdrawal or craving worse. While nicotine gum and patches may lessen the drug aspects of withdrawal, cravings often persist. WHAT ARE THE MEDICAL CONSEQUENCES OF NICOTINE USE?  Nicotine addiction accounts for one-third of all cancers. The top cancer caused by tobacco is lung cancer. Lung cancer is the number one cancer killer of both men and women.   Smoking is also associated with cancers of the:   Mouth.   Pharynx.   Larynx.   Esophagus.   Stomach.   Pancreas.   Cervix.   Kidney.   Ureter.   Bladder.   Smoking also causes lung diseases such as lasting (chronic) bronchitis and emphysema.   It worsens asthma in adults and children.   Smoking increases the risk of heart disease, including:   Stroke.   Heart attack.   Vascular disease.   Aneurysm.   Passive or secondary smoke can also increase medical risks including:   Asthma in children.   Sudden Infant Death Syndrome (SIDS).   Additionally, dropped cigarettes are the leading cause of residential fire fatalities.   Nicotine poisoning has been reported from accidental ingestion of tobacco products by children and pets. Death usually results in a few minutes from respiratory failure (when a person stops breathing) caused by paralysis.  TREATMENT   Medication. Nicotine replacement medicines such as nicotine gum and the patch are used to stop smoking. These medicines gradually lower the dosage   of nicotine in the body. These medicines do not contain the carbon monoxide and other toxins found in tobacco smoke.   Hypnotherapy.   Relaxation therapy.   Nicotine Anonymous (a 12-step support program). Find times and locations in your local yellow pages.  Document Released: 10/03/2003 Document  Revised: 01/16/2011 Document Reviewed: 02/24/2007 Exit[employer] Patient Information 2012 [employer]. 

## 2011-04-16 NOTE — Assessment & Plan Note (Signed)
Improved although he does continue to complain of nasal congestion. Encouraged to consider nasal saline and Cetirizine daily and let us know if symptoms worsen

## 2011-04-17 LAB — H. PYLORI ANTIBODY, IGG: H Pylori IgG: 0.9 {ISR}

## 2011-05-12 ENCOUNTER — Encounter: Payer: Self-pay | Admitting: Family Medicine

## 2011-05-12 ENCOUNTER — Ambulatory Visit (INDEPENDENT_AMBULATORY_CARE_PROVIDER_SITE_OTHER): Payer: Medicare Other | Admitting: Family Medicine

## 2011-05-12 VITALS — BP 136/78 | HR 98 | Temp 99.2°F | Ht 69.5 in | Wt 202.0 lb

## 2011-05-12 DIAGNOSIS — I10 Essential (primary) hypertension: Secondary | ICD-10-CM

## 2011-05-12 DIAGNOSIS — F17200 Nicotine dependence, unspecified, uncomplicated: Secondary | ICD-10-CM

## 2011-05-12 DIAGNOSIS — M79605 Pain in left leg: Secondary | ICD-10-CM

## 2011-05-12 DIAGNOSIS — M545 Low back pain: Secondary | ICD-10-CM

## 2011-05-12 MED ORDER — PREDNISONE 20 MG PO TABS: 20.0000 mg | ORAL_TABLET | Freq: Two times a day (BID) | ORAL | Status: DC

## 2011-05-12 MED ORDER — BACLOFEN 20 MG PO TABS: 20.0000 mg | ORAL_TABLET | Freq: Two times a day (BID) | ORAL | Status: DC | PRN

## 2011-05-12 NOTE — Patient Instructions (Signed)
Back Pain, Adult Low back pain is very common. About 1 in 5 people have back pain.The cause of low back pain is rarely dangerous. The pain often gets better over time.About half of people with a sudden onset of back pain feel better in just 2 weeks. About 8 in 10 people feel better by 6 weeks.  CAUSES Some common causes of back pain include:  Strain of the muscles or ligaments supporting the spine.   Wear and tear (degeneration) of the spinal discs.   Arthritis.   Direct injury to the back.  DIAGNOSIS Most of the time, the direct cause of low back pain is not known.However, back pain can be treated effectively even when the exact cause of the pain is unknown.Answering your caregiver's questions about your overall health and symptoms is one of the most accurate ways to make sure the cause of your pain is not dangerous. If your caregiver needs more information, he or she may order lab work or imaging tests (X-rays or MRIs).However, even if imaging tests show changes in your back, this usually does not require surgery. HOME CARE INSTRUCTIONS For many people, back pain returns.Since low back pain is rarely dangerous, it is often a condition that people can learn to manageon their own.   Remain active. It is stressful on the back to sit or stand in one place. Do not sit, drive, or stand in one place for more than 30 minutes at a time. Take short walks on level surfaces as soon as pain allows.Try to increase the length of time you walk each day.   Do not stay in bed.Resting more than 1 or 2 days can delay your recovery.   Do not avoid exercise or work.Your body is made to move.It is not dangerous to be active, even though your back may hurt.Your back will likely heal faster if you return to being active before your pain is gone.   Pay attention to your body when you bend and lift. Many people have less discomfortwhen lifting if they bend their knees, keep the load close to their  bodies,and avoid twisting. Often, the most comfortable positions are those that put less stress on your recovering back.   Find a comfortable position to sleep. Use a firm mattress and lie on your side with your knees slightly bent. If you lie on your back, put a pillow under your knees.   Only take over-the-counter or prescription medicines as directed by your caregiver. Over-the-counter medicines to reduce pain and inflammation are often the most helpful.Your caregiver may prescribe muscle relaxant drugs.These medicines help dull your pain so you can more quickly return to your normal activities and healthy exercise.   Put ice on the injured area.   Put ice in a plastic bag.   Place a towel between your skin and the bag.   Leave the ice on for 15 to 20 minutes, 3 to 4 times a day for the first 2 to 3 days. After that, ice and heat may be alternated to reduce pain and spasms.   Ask your caregiver about trying back exercises and gentle massage. This may be of some benefit.   Avoid feeling anxious or stressed.Stress increases muscle tension and can worsen back pain.It is important to recognize when you are anxious or stressed and learn ways to manage it.Exercise is a great option.  SEEK MEDICAL CARE IF:  You have pain that is not relieved with rest or medicine.   You have   pain that does not improve in 1 week.   You have new symptoms.   You are generally not feeling well.  SEEK IMMEDIATE MEDICAL CARE IF:   You have pain that radiates from your back into your legs.   You develop new bowel or bladder control problems.   You have unusual weakness or numbness in your arms or legs.   You develop nausea or vomiting.   You develop abdominal pain.   You feel faint.  Document Released: 01/27/2005 Document Revised: 01/16/2011 Document Reviewed: 06/17/2010 Exit[employer] Patient Information 2012 [employer]. 

## 2011-05-13 ENCOUNTER — Ambulatory Visit (HOSPITAL_BASED_OUTPATIENT_CLINIC_OR_DEPARTMENT_OTHER)
Admission: RE | Admit: 2011-05-13 | Discharge: 2011-05-13 | Disposition: A | Discharge status: Home / Self Care | Payer: Medicare Other | Source: Ambulatory Visit | Attending: Family Medicine | Admitting: Family Medicine

## 2011-05-13 DIAGNOSIS — M5137 Other intervertebral disc degeneration, lumbosacral region: Secondary | ICD-10-CM | POA: Insufficient documentation

## 2011-05-13 DIAGNOSIS — M545 Low back pain, unspecified: Secondary | ICD-10-CM | POA: Insufficient documentation

## 2011-05-13 DIAGNOSIS — M79605 Pain in left leg: Secondary | ICD-10-CM

## 2011-05-13 DIAGNOSIS — M47817 Spondylosis without myelopathy or radiculopathy, lumbosacral region: Secondary | ICD-10-CM

## 2011-05-13 DIAGNOSIS — M51379 Other intervertebral disc degeneration, lumbosacral region without mention of lumbar back pain or lower extremity pain: Secondary | ICD-10-CM | POA: Insufficient documentation

## 2011-05-13 DIAGNOSIS — M4120 Other idiopathic scoliosis, site unspecified: Secondary | ICD-10-CM | POA: Insufficient documentation

## 2011-05-20 NOTE — Assessment & Plan Note (Signed)
Adequately controlled despite pain today, no changes

## 2011-05-20 NOTE — Progress Notes (Signed)
Patient ID: Ian Gonzales, male   DOB: July 21, 1940, 71 y.o.   MRN: [id number] Ian Gonzales [id number] 03-06-1940 05/20/2011      Progress Note-Follow Up  Subjective  Chief Complaint  Chief Complaint  Patient presents with  . groin pain    going to lower back- legs weak X 4 days    HPI  Patient is an 71 year old male who is in today complaining of 4 days now, left hip/groin pain. He reports some mild low back pain as well denies any trauma falls or recent injury but has had trouble with his back and hips in the past. Denies any falls or trauma. Describes his legs as feeling weak at times due to the intensity of the pain. No fevers, chills. He has a history of some mild urinary incontinence and notes that his somewhat worse recently has a followup appointment with his urologist soon. No fecal incontinence. No change in bowel habits. No fevers, chills. No dysuria. No chest pain or palpitations. Continues to smoke unfortunately.  Past Medical History  Diagnosis Date  . Hypertension   . GERD (gastroesophageal reflux disease)   . Colon polyps   . Shingles 05-12-2010  . Back pain, lumbosacral   . Preventative health care 03/21/2011  . Sinusitis acute 03/24/2011  . Memory loss 04/16/2011    Past Surgical History  Procedure Date  . Lumbar laminectomy     twice  . Urethral stricture dilatation     No family history on file.  History   Social History  . Marital Status: Single    Spouse Name: N/A    Number of Children: N/A  . Years of Education: N/A   Occupational History  . Not on file.   Social History Main Topics  . Smoking status: Current Everyday Smoker -- 1.0 packs/day    Types: Cigarettes  . Smokeless tobacco: Never Used  . Alcohol Use: Yes     once in a while   . Drug Use: No  . Sexually Active: No   Other Topics Concern  . Not on file   Social History Narrative  . No narrative on file    Current Outpatient Prescriptions on File Prior to Visit  Medication  Sig Dispense Refill  . amLODipine (NORVASC) 5 MG tablet 1 tab qd for high  Blood pressure  30 tablet  11  . aspirin 81 MG tablet Take 81 mg by mouth daily.        Marland Kitchen omeprazole (PRILOSEC) 40 MG capsule       . PROAIR HFA 108 (90 BASE) MCG/ACT inhaler       . ranitidine (ZANTAC) 150 MG tablet Take 1 tablet (150 mg total) by mouth at bedtime.  30 tablet  5    No Known Allergies  Review of Systems  Review of Systems  Constitutional: Negative for fever and malaise/fatigue.  HENT: Negative for congestion.   Eyes: Negative for discharge.  Respiratory: Negative for shortness of breath.   Cardiovascular: Negative for chest pain, palpitations and leg swelling.  Gastrointestinal: Negative for nausea, abdominal pain and diarrhea.  Genitourinary: Negative for dysuria.  Musculoskeletal: Positive for back pain and joint pain. Negative for falls.       Low back pain, left hip and left leg pain, describes the pain as intense enough to make him feel weak  Skin: Negative for rash.  Neurological: Negative for loss of consciousness and headaches.  Endo/Heme/Allergies: Negative for polydipsia.  Psychiatric/Behavioral: Negative for depression and suicidal  ideas. The patient is not nervous/anxious and does not have insomnia.     Objective  BP 136/78  Pulse 98  Temp(Src) 99.2 F (37.3 C) (Temporal)  Ht 5' 9.5" (1.765 m)  Wt 202 lb (91.627 kg)  BMI 29.40 kg/m2  SpO2 97%  Physical Exam  Physical Exam  Constitutional: He is oriented to person, place, and time and well-developed, well-nourished, and in no distress. No distress.  HENT:  Head: Normocephalic and atraumatic.  Eyes: Conjunctivae are normal.  Neck: Neck supple. No thyromegaly present.  Cardiovascular: Normal rate, regular rhythm and normal heart sounds.   No murmur heard. Pulmonary/Chest: Effort normal and breath sounds normal. No respiratory distress.  Abdominal: He exhibits no distension and no mass. There is no tenderness.    Musculoskeletal: Normal range of motion. He exhibits tenderness. He exhibits no edema.       Mildly tender with palp over posterior left hip and with internal rotation of left hip  Neurological: He is alert and oriented to person, place, and time.  Skin: Skin is warm.  Psychiatric: Memory, affect and judgment normal.    Lab Results  Component Value Date   TSH 1.499 03/21/2011   Lab Results  Component Value Date   WBC 11.8* 03/21/2011   HGB 15.2 03/21/2011   HCT 44.6 03/21/2011   MCV 93.7 03/21/2011   PLT 349 03/21/2011   Lab Results  Component Value Date   CREATININE 0.83 03/21/2011   BUN 18 03/21/2011   NA 141 03/21/2011   K 4.3 03/21/2011   CL 105 03/21/2011   CO2 24 03/21/2011   Lab Results  Component Value Date   ALT 51 03/21/2011   AST 19 03/21/2011   ALKPHOS 111 03/21/2011   BILITOT 0.4 03/21/2011   Lab Results  Component Value Date   CHOL 231* 03/21/2011   Lab Results  Component Value Date   HDL 42 03/21/2011   Lab Results  Component Value Date   LDLCALC 170* 03/21/2011   Lab Results  Component Value Date   TRIG 96 03/21/2011   Lab Results  Component Value Date   CHOLHDL 5.5 03/21/2011     Assessment & Plan  TOBACCO USER Continues to smoke is encouraged to try cessation once again  HYPERTENSION Adequately controlled despite pain today, no changes  Back pain, lumbosacral With left hip and left leg radicular symptoms. Patient is offered referral today and declines, xray negative for any acute process.Patient is given steroids and Baclofen and will let us know if symptoms do not improve

## 2011-05-20 NOTE — Assessment & Plan Note (Addendum)
With left hip and left leg radicular symptoms. Patient is offered referral today and declines, xray negative for any acute process.Patient is given steroids and Baclofen and will let us know if symptoms do not improve

## 2011-05-20 NOTE — Assessment & Plan Note (Signed)
Continues to smoke is encouraged to try cessation once again

## 2011-05-22 ENCOUNTER — Encounter: Payer: Self-pay | Admitting: Family Medicine

## 2011-05-22 ENCOUNTER — Ambulatory Visit (INDEPENDENT_AMBULATORY_CARE_PROVIDER_SITE_OTHER): Payer: Medicare Other | Admitting: Family Medicine

## 2011-05-22 VITALS — BP 151/78 | HR 83 | Temp 98.2°F | Ht 69.5 in | Wt 202.8 lb

## 2011-05-22 DIAGNOSIS — F17200 Nicotine dependence, unspecified, uncomplicated: Secondary | ICD-10-CM

## 2011-05-22 DIAGNOSIS — I10 Essential (primary) hypertension: Secondary | ICD-10-CM

## 2011-05-22 DIAGNOSIS — R350 Frequency of micturition: Secondary | ICD-10-CM

## 2011-05-22 DIAGNOSIS — M545 Low back pain: Secondary | ICD-10-CM

## 2011-05-22 DIAGNOSIS — R109 Unspecified abdominal pain: Secondary | ICD-10-CM

## 2011-05-22 LAB — CBC
HCT: 41.7 % (ref 39.0–52.0)
Hemoglobin: 13.9 g/dL (ref 13.0–17.0)
MCHC: 33.3 g/dL (ref 30.0–36.0)
MCV: 94.1 fl (ref 78.0–100.0)
Platelets: 306 10*3/uL (ref 150.0–400.0)

## 2011-05-22 LAB — POCT URINALYSIS DIPSTICK
Glucose, UA: NEGATIVE
Spec Grav, UA: <=1.005
Urobilinogen, UA: 0.2

## 2011-05-22 LAB — RENAL FUNCTION PANEL
Albumin: 4.1 g/dL (ref 3.5–5.2)
BUN: 18 mg/dL (ref 6–23)
Chloride: 103 mEq/L (ref 96–112)
Phosphorus: 2.2 mg/dL — ABNORMAL LOW (ref 2.3–4.6)

## 2011-05-22 MED ORDER — CIPROFLOXACIN HCL 250 MG PO TABS: 250.0000 mg | ORAL_TABLET | Freq: Two times a day (BID) | ORAL | Status: DC

## 2011-05-22 NOTE — Progress Notes (Signed)
Patient ID: Ian Gonzales, male   DOB: [birth year], 71 y.o.   MRN: [id number] Ian Gonzales [id number] [birth year] 05/22/2011      Progress Note-Follow Up  Subjective  Chief Complaint  Chief Complaint  Patient presents with  . Follow-up    not feeling any better-hurting across lower abd    HPI  Patient is a 71 year old Caucasian male who is in today with persistent low back and now lower abdominal pain. He reports the pain is most notable left posterior hip and the suprapubic region. Last week it was noted in the left groin. Now is more in the bilateral lower quadrants and a burning sensation. He also has some intermittent urinary incontinence and burning. Denies any hematuria. Denies any change in bowels. No diarrhea, nausea, vomiting, constipation, bloody stool. No fevers no chills. No shortness of breath difficulty with initiation of urine or other acute complaints are noted. He is frustrated however with his level of fatigue  Past Medical History  Diagnosis Date  . Hypertension   . GERD (gastroesophageal reflux disease)   . Colon polyps   . Shingles 05-12-2010  . Back pain, lumbosacral   . Preventative health care 03/21/2011  . Sinusitis acute 03/24/2011  . Memory loss 04/16/2011    Past Surgical History  Procedure Date  . Lumbar laminectomy     twice  . Urethral stricture dilatation     History reviewed. No pertinent family history.  History   Social History  . Marital Status: Single    Spouse Name: N/A    Number of Children: N/A  . Years of Education: N/A   Occupational History  . Not on file.   Social History Main Topics  . Smoking status: Current Everyday Smoker -- 1.0 packs/day    Types: Cigarettes  . Smokeless tobacco: Never Used  . Alcohol Use: Yes     once in a while   . Drug Use: No  . Sexually Active: No   Other Topics Concern  . Not on file   Social History Narrative  . No narrative on file    Current Outpatient Prescriptions on File Prior  to Visit  Medication Sig Dispense Refill  . amLODipine (NORVASC) 5 MG tablet 1 tab qd for high  Blood pressure  30 tablet  11  . aspirin 81 MG tablet Take 81 mg by mouth daily.        Marland Kitchen omeprazole (PRILOSEC) 40 MG capsule       . PROAIR HFA 108 (90 BASE) MCG/ACT inhaler       . ranitidine (ZANTAC) 150 MG tablet Take 1 tablet (150 mg total) by mouth at bedtime.  30 tablet  5    No Known Allergies  Review of Systems  Review of Systems  Constitutional: Negative for fever and malaise/fatigue.  HENT: Negative for congestion.   Eyes: Negative for discharge.  Respiratory: Negative for shortness of breath.   Cardiovascular: Negative for chest pain, palpitations and leg swelling.  Gastrointestinal: Positive for abdominal pain. Negative for nausea and diarrhea.  Genitourinary: Positive for urgency and frequency. Negative for dysuria, hematuria and flank pain.  Musculoskeletal: Positive for back pain. Negative for falls.  Skin: Negative for rash.  Neurological: Negative for loss of consciousness and headaches.  Endo/Heme/Allergies: Negative for polydipsia.  Psychiatric/Behavioral: Negative for depression and suicidal ideas. The patient is not nervous/anxious and does not have insomnia.     Objective  BP 151/78  Pulse 83  Temp(Src) 98.2 F (36.8 C) (  Temporal)  Ht 5' 9.5" (1.765 m)  Wt 202 lb 12.8 oz (91.989 kg)  BMI 29.52 kg/m2  SpO2 96%  Physical Exam  Physical Exam  Constitutional: He is oriented to person, place, and time and well-developed, well-nourished, and in no distress. No distress.  HENT:  Head: Normocephalic and atraumatic.  Eyes: Conjunctivae are normal.  Neck: Neck supple. No thyromegaly present.  Cardiovascular: Normal rate, regular rhythm and normal heart sounds.   No murmur heard. Pulmonary/Chest: Effort normal and breath sounds normal. No respiratory distress.  Abdominal: He exhibits no distension and no mass. There is no tenderness.  Musculoskeletal: He  exhibits tenderness. He exhibits no edema.       Mild pain with palp over left posterior sacroiliac joint  Neurological: He is alert and oriented to person, place, and time.  Skin: Skin is warm.  Psychiatric: Memory, affect and judgment normal.    Lab Results  Component Value Date   TSH 1.499 03/21/2011   Lab Results  Component Value Date   WBC 11.8* 03/21/2011   HGB 15.2 03/21/2011   HCT 44.6 03/21/2011   MCV 93.7 03/21/2011   PLT 349 03/21/2011   Lab Results  Component Value Date   CREATININE 0.83 03/21/2011   BUN 18 03/21/2011   NA 141 03/21/2011   K 4.3 03/21/2011   CL 105 03/21/2011   CO2 24 03/21/2011   Lab Results  Component Value Date   ALT 51 03/21/2011   AST 19 03/21/2011   ALKPHOS 111 03/21/2011   BILITOT 0.4 03/21/2011   Lab Results  Component Value Date   CHOL 231* 03/21/2011   Lab Results  Component Value Date   HDL 42 03/21/2011   Lab Results  Component Value Date   LDLCALC 170* 03/21/2011   Lab Results  Component Value Date   TRIG 96 03/21/2011   Lab Results  Component Value Date   CHOLHDL 5.5 03/21/2011     Assessment & Plan  TOBACCO USER Unfortunately he continues to smoke, encouraged cessation  Back pain, lumbosacral Xray revealed significant DDD most notably at L4-5. He is offered a referral to Ortho again and declines but agrees to return next week for follow up and possible referral then  HYPERTENSION Mild elevation with acute pain will continue to monitor  Abdominal pain Increased urinary frequency and intermittent incontinence, urinalysis unremarkable but will culture urine and treat a possible prostatitis with Ciprofloxacin, reevaluate next week

## 2011-05-22 NOTE — Assessment & Plan Note (Signed)
Xray revealed significant DDD most notably at L4-5. He is offered a referral to Ortho again and declines but agrees to return next week for follow up and possible referral then

## 2011-05-22 NOTE — Assessment & Plan Note (Signed)
Mild elevation with acute pain will continue to monitor

## 2011-05-22 NOTE — Assessment & Plan Note (Signed)
Increased urinary frequency and intermittent incontinence, urinalysis unremarkable but will culture urine and treat a possible prostatitis with Ciprofloxacin, reevaluate next week

## 2011-05-22 NOTE — Assessment & Plan Note (Signed)
Unfortunately he continues to smoke, encouraged cessation

## 2011-05-22 NOTE — Patient Instructions (Signed)
Abdominal Pain (Nonspecific) Your exam might not show the exact reason you have abdominal pain. Since there are many different causes of abdominal pain, another checkup and more tests may be needed. It is very important to follow up for lasting (persistent) or worsening symptoms. A possible cause of abdominal pain in any person who still has his or her appendix is acute appendicitis. Appendicitis is often hard to diagnose. Normal blood tests, urine tests, ultrasound, and CT scans do not completely rule out early appendicitis or other causes of abdominal pain. Sometimes, only the changes that happen over time will allow appendicitis and other causes of abdominal pain to be determined. Other potential problems that may require surgery may also take time to become more apparent. Because of this, it is important that you follow all of the instructions below. HOME CARE INSTRUCTIONS   Rest as much as possible.   Do not eat solid food until your pain is gone.   While adults or children have pain: A diet of water, weak decaffeinated tea, broth or bouillon, gelatin, oral rehydration solutions (ORS), frozen ice pops, or ice chips may be helpful.   When pain is gone in adults or children: Start a light diet (dry toast, crackers, applesauce, or white rice). Increase the diet slowly as long as it does not bother you. Eat no dairy products (including cheese and eggs) and no spicy, fatty, fried, or high-fiber foods.   Use no alcohol, caffeine, or cigarettes.   Take your regular medicines unless your caregiver told you not to.   Take any prescribed medicine as directed.   Only take over-the-counter or prescription medicines for pain, discomfort, or fever as directed by your caregiver. Do not give aspirin to children.  If your caregiver has given you a follow-up appointment, it is very important to keep that appointment. Not keeping the appointment could result in a permanent injury and/or lasting (chronic) pain  and/or disability. If there is any problem keeping the appointment, you must call to reschedule.  SEEK IMMEDIATE MEDICAL CARE IF:   Your pain is not gone in 24 hours.   Your pain becomes worse, changes location, or feels different.   You or your child has an oral temperature above 102 F (38.9 C), not controlled by medicine.   Your baby is older than 3 months with a rectal temperature of 102 F (38.9 C) or higher.   Your baby is 62 months old or younger with a rectal temperature of 100.4 F (38 C) or higher.   You have shaking chills.   You keep throwing up (vomiting) or cannot drink liquids.   There is blood in your vomit or you see blood in your bowel movements.   Your bowel movements become dark or black.   You have frequent bowel movements.   Your bowel movements stop (become blocked) or you cannot pass gas.   You have bloody, frequent, or painful urination.   You have yellow discoloration in the skin or whites of the eyes.   Your stomach becomes bloated or bigger.   You have dizziness or fainting.   You have chest or back pain.  MAKE SURE YOU:   Understand these instructions.   Will watch your condition.   Will get help right away if you are not doing well or get worse.  Document Released: 01/27/2005 Document Revised: 01/16/2011 Document Reviewed: 12/25/2008 [facility] Patient Information 2012 [place], [place].    Can increase Tylenol to 2 tabs twice dialy and continue  Aleve in am

## 2011-05-23 NOTE — Progress Notes (Signed)
Pt called back and left a message for me to return his call. I called patient and left another message to return my call

## 2011-05-23 NOTE — Progress Notes (Signed)
Patient informed and is feeling a little bit better so he will wait until next week to see if he wants the Flagyl called in

## 2011-05-26 ENCOUNTER — Ambulatory Visit: Payer: Medicare Other | Admitting: Family Medicine

## 2011-05-28 ENCOUNTER — Ambulatory Visit: Payer: Medicare Other | Admitting: Family Medicine

## 2011-05-30 ENCOUNTER — Ambulatory Visit (INDEPENDENT_AMBULATORY_CARE_PROVIDER_SITE_OTHER): Payer: Medicare Other | Admitting: Family Medicine

## 2011-05-30 ENCOUNTER — Encounter: Payer: Self-pay | Admitting: Family Medicine

## 2011-05-30 VITALS — BP 148/84 | HR 86 | Temp 98.6°F | Ht 69.5 in | Wt 200.4 lb

## 2011-05-30 DIAGNOSIS — R109 Unspecified abdominal pain: Secondary | ICD-10-CM

## 2011-05-30 DIAGNOSIS — I10 Essential (primary) hypertension: Secondary | ICD-10-CM

## 2011-05-30 DIAGNOSIS — F17200 Nicotine dependence, unspecified, uncomplicated: Secondary | ICD-10-CM

## 2011-05-30 DIAGNOSIS — M549 Dorsalgia, unspecified: Secondary | ICD-10-CM

## 2011-05-30 DIAGNOSIS — M545 Low back pain: Secondary | ICD-10-CM

## 2011-05-30 DIAGNOSIS — T7840XA Allergy, unspecified, initial encounter: Secondary | ICD-10-CM

## 2011-05-30 HISTORY — DX: Allergy, unspecified, initial encounter: T78.40XA

## 2011-05-30 MED ORDER — CETIRIZINE HCL 10 MG PO TABS: 10.0000 mg | ORAL_TABLET | Freq: Every day | ORAL | Status: DC | PRN

## 2011-05-30 MED ORDER — TRAMADOL HCL 50 MG PO TABS: 50.0000 mg | ORAL_TABLET | Freq: Three times a day (TID) | ORAL | Status: AC | PRN

## 2011-05-30 NOTE — Assessment & Plan Note (Signed)
Asked to stop Sudafed and try Cetrizine daily as needed

## 2011-05-30 NOTE — Assessment & Plan Note (Signed)
Improving with ciprofloxacin unclear if his symptoms were GI or GU related, he will let us know if symptoms worsen again

## 2011-05-30 NOTE — Assessment & Plan Note (Signed)
Has taken Sudafed in the past 24 hours is asked to stop it and try Zyrtec instead

## 2011-05-30 NOTE — Progress Notes (Signed)
Patient ID: Ian Gonzales, male   DOB: 05/23/1940, 71 y.o.   MRN: [id number] [facility] [id number] Jul 30, 1940 05/30/2011      Progress Note-Follow Up  Subjective  Chief Complaint  Chief Complaint  Patient presents with  . Follow-up    1 week    HPI  Patient is a 71 year old Caucasian male in today for evaluation of back pain. He notes that since last week his back pain and hip pain are slightly improved. He has been alternating Aleve and Tylenol. His abdominal pain seemed to respond to ciprofloxacin and has largely been resolved. His polyuria and his incontinence has improved as well. He has been struggling with increased nasal congestion and pruritus in his nose secondary to allergies. He is noting some postnasal drip but denies fevers, chills cough headache, cough, chest pain, palpitations, shortness of breath, GI or GU concerns at today's visit.  Past Medical History  Diagnosis Date  . Hypertension   . GERD (gastroesophageal reflux disease)   . Colon polyps   . Shingles 05-12-2010  . Back pain, lumbosacral   . Preventative health care 03/21/2011  . Sinusitis acute 03/24/2011  . Memory loss 04/16/2011  . Allergic state 05/30/2011    Past Surgical History  Procedure Date  . Lumbar laminectomy     twice  . Urethral stricture dilatation     History reviewed. No pertinent family history.  History   Social History  . Marital Status: Single    Spouse Name: N/A    Number of Children: N/A  . Years of Education: N/A   Occupational History  . Not on file.   Social History Main Topics  . Smoking status: Current Everyday Smoker -- 1.0 packs/day    Types: Cigarettes  . Smokeless tobacco: Never Used  . Alcohol Use: Yes     once in a while   . Drug Use: No  . Sexually Active: No   Other Topics Concern  . Not on file   Social History Narrative  . No narrative on file    Current Outpatient Prescriptions on File Prior to Visit  Medication Sig Dispense Refill  .  amLODipine (NORVASC) 5 MG tablet 1 tab qd for high  Blood pressure  30 tablet  11  . aspirin 81 MG tablet Take 81 mg by mouth daily.        . ciprofloxacin (CIPRO) 250 MG tablet Take 1 tablet (250 mg total) by mouth 2 (two) times daily.  20 tablet  0  . omeprazole (PRILOSEC) 40 MG capsule       . PROAIR HFA 108 (90 BASE) MCG/ACT inhaler       . ranitidine (ZANTAC) 150 MG tablet Take 1 tablet (150 mg total) by mouth at bedtime.  30 tablet  5  . cetirizine (ZYRTEC) 10 MG tablet Take 1 tablet (10 mg total) by mouth daily as needed for allergies (replaced Sudafed (red pill)).  30 tablet  11    No Known Allergies  Review of Systems  Review of Systems  Constitutional: Positive for malaise/fatigue. Negative for fever.  HENT: Negative for congestion.   Eyes: Negative for discharge.  Respiratory: Negative for shortness of breath.   Cardiovascular: Negative for chest pain, palpitations and leg swelling.  Gastrointestinal: Positive for abdominal pain. Negative for nausea, diarrhea, constipation, blood in stool and melena.  Genitourinary: Negative for dysuria.  Musculoskeletal: Positive for back pain. Negative for falls.  Skin: Negative for rash.  Neurological: Negative for loss of  consciousness and headaches.  Endo/Heme/Allergies: Negative for polydipsia.  Psychiatric/Behavioral: Negative for depression and suicidal ideas. The patient is not nervous/anxious and does not have insomnia.     Objective  BP 154/79  Pulse 86  Temp(Src) 98.6 F (37 C) (Temporal)  Ht 5' 9.5" (1.765 m)  Wt 200 lb 6.4 oz (90.901 kg)  BMI 29.17 kg/m2  SpO2 95%  Physical Exam  Physical Exam  Constitutional: He is oriented to person, place, and time and well-developed, well-nourished, and in no distress. No distress.  HENT:  Head: Normocephalic and atraumatic.  Eyes: Conjunctivae are normal.  Neck: Neck supple. No thyromegaly present.  Cardiovascular: Normal rate, regular rhythm and normal heart sounds.   No  murmur heard. Pulmonary/Chest: Effort normal and breath sounds normal. No respiratory distress.  Abdominal: He exhibits no distension and no mass. There is no tenderness.  Musculoskeletal: He exhibits no edema.  Neurological: He is alert and oriented to person, place, and time.  Skin: Skin is warm.  Psychiatric: Memory, affect and judgment normal.    Lab Results  Component Value Date   TSH 1.499 03/21/2011   Lab Results  Component Value Date   WBC 15.1* 05/22/2011   HGB 13.9 05/22/2011   HCT 41.7 05/22/2011   MCV 94.1 05/22/2011   PLT 306.0 05/22/2011   Lab Results  Component Value Date   CREATININE 0.8 05/22/2011   BUN 18 05/22/2011   NA 137 05/22/2011   K 4.1 05/22/2011   CL 103 05/22/2011   CO2 24 05/22/2011   Lab Results  Component Value Date   ALT 51 03/21/2011   AST 19 03/21/2011   ALKPHOS 111 03/21/2011   BILITOT 0.4 03/21/2011   Lab Results  Component Value Date   CHOL 231* 03/21/2011   Lab Results  Component Value Date   HDL 42 03/21/2011   Lab Results  Component Value Date   LDLCALC 170* 03/21/2011   Lab Results  Component Value Date   TRIG 96 03/21/2011   Lab Results  Component Value Date   CHOLHDL 5.5 03/21/2011     Assessment & Plan   Abdominal pain Improving with ciprofloxacin unclear if his symptoms were GI or GU related, he will let us know if symptoms worsen again  Back pain, lumbosacral Improved, is given a small amount of Tramadol to try prn and may continue occasional Tylenol or Aleve> he is offered a orthopaedics appt but declines for now.  HYPERTENSION Has taken Sudafed in the past 24 hours is asked to stop it and try Zyrtec instead  Allergic state Asked to stop Sudafed and try Cetrizine daily as needed  TOBACCO USER Continues to smoke, has no plans to quit

## 2011-05-30 NOTE — Assessment & Plan Note (Signed)
Improved, is given a small amount of Tramadol to try prn and may continue occasional Tylenol or Aleve> he is offered a orthopaedics appt but declines for now.

## 2011-06-01 ENCOUNTER — Encounter: Payer: Self-pay | Admitting: Family Medicine

## 2011-06-01 NOTE — Assessment & Plan Note (Signed)
Continues to smoke, has no plans to quit

## 2011-07-30 ENCOUNTER — Encounter: Payer: Self-pay | Admitting: Family Medicine

## 2011-07-30 ENCOUNTER — Ambulatory Visit (INDEPENDENT_AMBULATORY_CARE_PROVIDER_SITE_OTHER)
Admission: RE | Admit: 2011-07-30 | Discharge: 2011-07-30 | Disposition: A | Discharge status: Home / Self Care | Payer: Medicare Other | Source: Ambulatory Visit | Attending: Family Medicine | Admitting: Family Medicine

## 2011-07-30 ENCOUNTER — Ambulatory Visit (INDEPENDENT_AMBULATORY_CARE_PROVIDER_SITE_OTHER): Payer: Medicare Other | Admitting: Family Medicine

## 2011-07-30 VITALS — BP 132/84 | HR 78 | Temp 97.8°F | Ht 69.5 in | Wt 203.1 lb

## 2011-07-30 DIAGNOSIS — R413 Other amnesia: Secondary | ICD-10-CM

## 2011-07-30 DIAGNOSIS — J209 Acute bronchitis, unspecified: Secondary | ICD-10-CM

## 2011-07-30 DIAGNOSIS — B029 Zoster without complications: Secondary | ICD-10-CM

## 2011-07-30 DIAGNOSIS — I10 Essential (primary) hypertension: Secondary | ICD-10-CM

## 2011-07-30 LAB — CBC WITH DIFFERENTIAL/PLATELET
Basophils Relative: 0.6 % (ref 0.0–3.0)
Eosinophils Absolute: 0.2 10*3/uL (ref 0.0–0.7)
Hemoglobin: 14.4 g/dL (ref 13.0–17.0)
Lymphs Abs: 2.7 10*3/uL (ref 0.7–4.0)
MCHC: 33.1 g/dL (ref 30.0–36.0)
MCV: 95 fl (ref 78.0–100.0)
Monocytes Absolute: 1.3 10*3/uL — ABNORMAL HIGH (ref 0.1–1.0)
Neutro Abs: 6.3 10*3/uL (ref 1.4–7.7)
Neutrophils Relative %: 59.3 % (ref 43.0–77.0)
RBC: 4.57 Mil/uL (ref 4.22–5.81)

## 2011-07-30 MED ORDER — SULFAMETHOXAZOLE-TRIMETHOPRIM 800-160 MG PO TABS: 1.0000 | ORAL_TABLET | Freq: Two times a day (BID) | ORAL | Status: AC

## 2011-07-30 MED ORDER — GUAIFENESIN ER 600 MG PO TB12: 600.0000 mg | ORAL_TABLET | Freq: Two times a day (BID) | ORAL | Status: DC

## 2011-07-30 NOTE — Patient Instructions (Addendum)

## 2011-07-31 ENCOUNTER — Encounter: Payer: Self-pay | Admitting: Family Medicine

## 2011-07-31 DIAGNOSIS — J209 Acute bronchitis, unspecified: Secondary | ICD-10-CM | POA: Insufficient documentation

## 2011-07-31 NOTE — Assessment & Plan Note (Signed)
Patient with low educational level was not able to perform any of the test requiring spelling on the MMSE but was able to get full credit on the rest of the test minus 1 point so really a 23 of 24. His memory seems to be more related to attention and depression. He acknowledges he is blue and has a lack of motivation. He does not have any family he is close to and is frustrated with his state of health. He is offered an antidepressant and declines at this time. We will reassess at next month's visit.

## 2011-07-31 NOTE — Assessment & Plan Note (Signed)
Adequately controlled on repeat check. No changes today

## 2011-07-31 NOTE — Assessment & Plan Note (Signed)
Started on Bactrim and Mucinex and encouraged to increase his fluid intake. CXR today unremarkable for any acute process. Leukocytosis improvedon repeat lab work today

## 2011-07-31 NOTE — Progress Notes (Signed)
Patient ID: Ian Gonzales, male   DOB: Aug 13, 1940, 71 y.o.   MRN: [id number] Ian Gonzales [id number] August 15, 1940 07/31/2011      Progress Note-Follow Up  Subjective  Chief Complaint  Chief Complaint  Patient presents with  . Follow-up    and MMSE    HPI  Patient is a 71 year old Caucasian male who is in today for followup. Report worsening chest congestion. He says over the last one to 2 weeks she's had increased cough productive of green thick sequential sputum. His malaise and myalgias. He denies any obvious fevers, sore throat, ear pain or headache. No chest pain or palpitations. No GI or GU complaints. He does note a couple of bug bites over his right ankle over his left calf which appeared itchy and irritated but are resolving. He's recently been seen by his orthopedist again and it was determined he was having shingles flared again. Was given a course of acyclovir and actually started on gabapentin for long time pain management. Does report the left groin/left hip pain is somewhat improved. He continues to struggle with low mood, anhedonia. Denies suicidal ideation but acknowledges he is struggling with low mood and isolation. Continues to have trouble with his memory but in fact has trouble attending. Recently had his right eye cataract removed and is recovering from that well and is scheduled to have his left eye cataract removed in the near future.  Past Medical History  Diagnosis Date  . Hypertension   . GERD (gastroesophageal reflux disease)   . Colon polyps   . Shingles 05-12-2010  . Back pain, lumbosacral   . Preventative health care 03/21/2011  . Sinusitis acute 03/24/2011  . Memory loss 04/16/2011  . Allergic state 05/30/2011  . Acute bronchitis 07/31/2011    Past Surgical History  Procedure Date  . Lumbar laminectomy     twice  . Urethral stricture dilatation     No family history on file.  History   Social History  . Marital Status: Single    Spouse Name:  N/A    Number of Children: N/A  . Years of Education: N/A   Occupational History  . Not on file.   Social History Main Topics  . Smoking status: Current Everyday Smoker -- 1.0 packs/day    Types: Cigarettes  . Smokeless tobacco: Never Used  . Alcohol Use: Yes     once in a while   . Drug Use: No  . Sexually Active: No   Other Topics Concern  . Not on file   Social History Narrative  . No narrative on file    Current Outpatient Prescriptions on File Prior to Visit  Medication Sig Dispense Refill  . amLODipine (NORVASC) 5 MG tablet 1 tab qd for high  Blood pressure  30 tablet  11  . aspirin 81 MG tablet Take 81 mg by mouth daily.        Marland Kitchen PROAIR HFA 108 (90 BASE) MCG/ACT inhaler       . ranitidine (ZANTAC) 150 MG tablet Take 1 tablet (150 mg total) by mouth at bedtime.  30 tablet  5  . gabapentin (NEURONTIN) 100 MG capsule         No Known Allergies  Review of Systems  Review of Systems  Constitutional: Positive for malaise/fatigue. Negative for fever.  HENT: Positive for congestion. Negative for ear pain.   Eyes: Negative for discharge.  Respiratory: Positive for cough, sputum production and shortness of breath.   Cardiovascular:  Negative for chest pain, palpitations and leg swelling.  Gastrointestinal: Negative for nausea, abdominal pain and diarrhea.  Genitourinary: Negative for dysuria.  Musculoskeletal: Positive for joint pain. Negative for falls.       Left hip  Skin: Negative for rash.  Neurological: Negative for loss of consciousness and headaches.  Endo/Heme/Allergies: Negative for polydipsia.  Psychiatric/Behavioral: Negative for depression and suicidal ideas. The patient is not nervous/anxious and does not have insomnia.     Objective  BP 132/84  Pulse 78  Temp 97.8 F (36.6 C) (Temporal)  Ht 5' 9.5" (1.765 m)  Wt 203 lb 1.9 oz (92.135 kg)  BMI 29.57 kg/m2  SpO2 97%  Physical Exam  Physical Exam  Constitutional: He is oriented to person,  place, and time and well-developed, well-nourished, and in no distress. No distress.  HENT:  Head: Normocephalic and atraumatic.  Eyes: Conjunctivae are normal.  Neck: Neck supple. No thyromegaly present.  Cardiovascular: Normal rate, regular rhythm and normal heart sounds.   No murmur heard. Pulmonary/Chest: Effort normal. No respiratory distress.       Decreased BS b/l bases  Abdominal: He exhibits no distension and no mass. There is no tenderness.  Musculoskeletal: He exhibits no edema.  Neurological: He is alert and oriented to person, place, and time.  Skin: Skin is warm.  Psychiatric: Memory, affect and judgment normal.    Lab Results  Component Value Date   TSH 1.499 03/21/2011   Lab Results  Component Value Date   WBC 10.7* 07/30/2011   HGB 14.4 07/30/2011   HCT 43.5 07/30/2011   MCV 95.0 07/30/2011   PLT 345.0 07/30/2011   Lab Results  Component Value Date   CREATININE 0.8 05/22/2011   BUN 18 05/22/2011   NA 137 05/22/2011   K 4.1 05/22/2011   CL 103 05/22/2011   CO2 24 05/22/2011   Lab Results  Component Value Date   ALT 51 03/21/2011   AST 19 03/21/2011   ALKPHOS 111 03/21/2011   BILITOT 0.4 03/21/2011   Lab Results  Component Value Date   CHOL 231* 03/21/2011   Lab Results  Component Value Date   HDL 42 03/21/2011   Lab Results  Component Value Date   LDLCALC 170* 03/21/2011   Lab Results  Component Value Date   TRIG 96 03/21/2011   Lab Results  Component Value Date   CHOLHDL 5.5 03/21/2011     Assessment & Plan  Memory loss Patient with low educational level was not able to perform any of the test requiring spelling on the MMSE but was able to get full credit on the rest of the test minus 1 point so really a 23 of 24. His memory seems to be more related to attention and depression. He acknowledges he is blue and has a lack of motivation. He does not have any family he is close to and is frustrated with his state of health. He is offered an antidepressant and  declines at this time. We will reassess at next month's visit.  HYPERTENSION Adequately controlled on repeat check. No changes today  Acute bronchitis Started on Bactrim and Mucinex and encouraged to increase his fluid intake. CXR today unremarkable for any acute process. Leukocytosis improvedon repeat lab work today  Shingles Was just treated again by his orthopaedist and started on Gabapentin for possible post herpetic neuralgia which seems to be helping he is encouraged to continue taking this

## 2011-07-31 NOTE — Assessment & Plan Note (Signed)
Was just treated again by his orthopaedist and started on Gabapentin for possible post herpetic neuralgia which seems to be helping he is encouraged to continue taking this

## 2011-08-18 ENCOUNTER — Ambulatory Visit: Payer: Medicare Other | Admitting: Family Medicine

## 2011-09-01 ENCOUNTER — Ambulatory Visit (INDEPENDENT_AMBULATORY_CARE_PROVIDER_SITE_OTHER): Payer: Medicare Other | Admitting: Family Medicine

## 2011-09-01 ENCOUNTER — Encounter: Payer: Self-pay | Admitting: Family Medicine

## 2011-09-01 VITALS — BP 132/78 | HR 95 | Temp 98.4°F | Ht 69.5 in | Wt 202.0 lb

## 2011-09-01 DIAGNOSIS — R52 Pain, unspecified: Secondary | ICD-10-CM

## 2011-09-01 DIAGNOSIS — R350 Frequency of micturition: Secondary | ICD-10-CM

## 2011-09-01 DIAGNOSIS — N419 Inflammatory disease of prostate, unspecified: Secondary | ICD-10-CM

## 2011-09-01 DIAGNOSIS — R5383 Other fatigue: Secondary | ICD-10-CM

## 2011-09-01 DIAGNOSIS — R319 Hematuria, unspecified: Secondary | ICD-10-CM

## 2011-09-01 DIAGNOSIS — N400 Enlarged prostate without lower urinary tract symptoms: Secondary | ICD-10-CM

## 2011-09-01 DIAGNOSIS — F17200 Nicotine dependence, unspecified, uncomplicated: Secondary | ICD-10-CM

## 2011-09-01 DIAGNOSIS — R300 Dysuria: Secondary | ICD-10-CM

## 2011-09-01 DIAGNOSIS — I10 Essential (primary) hypertension: Secondary | ICD-10-CM

## 2011-09-01 DIAGNOSIS — R109 Unspecified abdominal pain: Secondary | ICD-10-CM

## 2011-09-01 DIAGNOSIS — R5381 Other malaise: Secondary | ICD-10-CM

## 2011-09-01 DIAGNOSIS — K219 Gastro-esophageal reflux disease without esophagitis: Secondary | ICD-10-CM

## 2011-09-01 LAB — RENAL FUNCTION PANEL
Chloride: 107 mEq/L (ref 96–112)
GFR: 68.94 mL/min (ref >60.00)
Phosphorus: 2.8 mg/dL (ref 2.3–4.6)
Potassium: 4.4 mEq/L (ref 3.5–5.1)

## 2011-09-01 LAB — POCT URINALYSIS DIPSTICK
Bilirubin, UA: NEGATIVE
Ketones, UA: NEGATIVE
Leukocytes, UA: NEGATIVE
Nitrite, UA: NEGATIVE
Protein, UA: NEGATIVE

## 2011-09-01 LAB — CBC
MCV: 93.8 fl (ref 78.0–100.0)
RBC: 4.54 Mil/uL (ref 4.22–5.81)
RDW: 13.9 % (ref 11.5–14.6)
WBC: 10.3 10*3/uL (ref 4.5–10.5)

## 2011-09-01 LAB — HEPATIC FUNCTION PANEL
Albumin: 4.6 g/dL (ref 3.5–5.2)
Total Protein: 7.7 g/dL (ref 6.0–8.3)

## 2011-09-01 MED ORDER — CIPROFLOXACIN HCL 250 MG PO TABS: 250.0000 mg | ORAL_TABLET | Freq: Two times a day (BID) | ORAL | Status: AC

## 2011-09-01 MED ORDER — TRAMADOL HCL 50 MG PO TABS: 50.0000 mg | ORAL_TABLET | Freq: Three times a day (TID) | ORAL | Status: DC | PRN

## 2011-09-01 MED ORDER — RANITIDINE HCL 300 MG PO TABS: 300.0000 mg | ORAL_TABLET | Freq: Every day | ORAL | Status: DC

## 2011-09-01 NOTE — Patient Instructions (Addendum)
Prostatitis Prostatitis is an inflammation (the body's way of reacting to injury and/or infection) of the prostate gland. The prostate gland is a male organ. The gland is about the size and shape of a walnut. The prostate is located just below the bladder. It produces semen, which is a fluid that helps nourish and transport sperm. Prostatitis is the most common urinary tract problem in men younger than age 73. There are 4 categories of prostatitis:  I - Acute bacterial prostatitis.   II - Chronic bacterial prostatitis.   III - Chronic prostatitis and chronic pelvic pain syndrome (CPPS).   Inflammatory.   Non inflammatory.   IV - Asymptomatic inflammatory prostatitis.  Acute and chronic bacterial prostatitis are problems with bacterial infections of the prostate. "Acute" infection is usually a one-time problem. "Chronic" bacterial prostatitis is a condition with recurrent infection. It is usually caused by the same germ(bacteria). CPPS has symptoms similar to prostate infection. However, no infection is actually found. This condition can cause problems of ongoing pain. Currently, it cannot be cured. Treatments are available and aimed at symptom control.  Asymptomatic inflammatory prostatitis has no symptoms. It is a condition where infection-fighting cells are found by chance in the urine. The diagnosis is made most often during an exam for other conditions. Other conditions could be infertility or a high level of PSA (prostate-specific antigen) in the blood. SYMPTOMS  Symptoms can vary depending upon the type of prostatitis that exists. There can also be overlap in symptoms. This can make diagnosis difficult. Symptoms: For Acute bacterial prostatitis  Painful urination.   Fever or chills.   Muscle or joint pains.   Low back pain.   Low abdominal pain.   Inability to empty bladder completely.   Sudden urges to urinate.   Frequent urination during the day.   Difficulty starting  urine stream.   Need to urinate several times at night (nocturia).   Weak urine stream.   Urethral (tube that carries urine from the bladder out of the body) discharge and dribbling after urination.  For Chronic bacterial prostatitis  Rectal pain.   Pain in the testicles, penis, or tip of the penis.   Pain in the space between the anus and scrotum (perineum).   Low back pain.   Low abdominal pain.   Problems with sexual function.   Painful ejaculation.   Bloody semen.   Inability to empty bladder completely.   Painful urination.   Sudden urges to urinate.   Frequent urination during the day.   Difficulty starting urine stream.   Need to urinate several times at night (nocturia).   Weak urine stream.   Dribbling after urination.   Urethral discharge.  For Chronic prostatitis and chronic pelvic pain syndrome (CPPS) Symptoms are the same as those for chronic bacterial prostatitis. Problems with sexual function are often the reason for seeking care. This important problem should be discussed with your caregiver. For Asymptomatic inflammatory prostatitis As noted above, there are no symptoms with this condition. DIAGNOSIS   Your caregiver may perform a rectal exam. This exam is to determine if the prostate is swollen and tender.   Sometimes blood work is performed. This is done to see if your white blood cell count is elevated. The Prostate Specific Antigen (PSA) is also measured. PSA is a blood test that can help detect early prostate cancer.   A urinalysis is done to find out what type of infection is present if this is a suspected cause. An   additional urinalysis may be done after a digital rectal exam. This is to see if white blood cells are pushed out of the prostate and into the urine. A low-grade infection of the prostate may not be found on the first urinalysis.  In more difficult cases, your caregiver may advise other tests. Tests could include:  Urodynamics  -- Tests the function of the bladder and the organs involved in triggering and controlling normal urination.   Urine flow rate.   Cystoscopy -- In this procedure, a thin, telescope-like tube with a light and tiny camera attached (cystoscope) is inserted into the bladder through the urethra. This allows the caregiver to see the inside of the urethra and bladder.   Electromyography -- This procedure tests how the muscles and nerves of the bladder work. It is focused on the muscles that control the anus and pelvic floor. These are the muscles between the anus and scrotum.  In people who show no signs of infection, certain uncommon infections might be causing constant or recurrent symptoms. These uncommon infections are difficult to detect. More work in medicine may help find solutions to these problems. TREATMENT  Antibiotics are used to treat infections caused by germs. If the infection is not treated and becomes long lasting (chronic), it may become a lower grade infection with minor, continual problems. Without treatment, the prostate may develop a boil or furuncle (abscess). This may require surgical treatment. For those with chronic prostatitis and CPPS, it is important to work closely with your primary caregiver and urologist. For some, the medicines that are used to treat a non-cancerous, enlarged prostate (benign prostatic hypertrophy) may be helpful. Referrals to specialists other than urologists may be necessary. In rare cases when all treatments have been inadequate for pain control, an operation to remove the prostate may be recommended. This is very rare and before this is considered thorough discussion with your urologist is highly recommended.  In cases of secondary to chronic non-bacterial prostatitis, a good relationship with your urologist or primary caregiver is essential because it is often a recurrent prolonged condition that requires a good understanding of the causes and a commitment  to therapy aimed at controlling your symptoms. HOME CARE INSTRUCTIONS   Hot sitz baths for 20 minutes, 4 times per day, may help relieve pain.   Non-prescription pain killers may be used as your caregiver recommends if you have no allergies to them. Some illnesses or conditions prevent use of non-prescription drugs. If unsure, check with your caregiver. Take all medications as directed. Take the antibiotics for the prescribed length of time, even if you are feeling better.  SEEK MEDICAL CARE IF:   You have any worsening of the symptoms that originally brought you to your caregiver.   You have an oral temperature above 102 F (38.9 C).   You experience any side effects from medications prescribed.  SEEK IMMEDIATE MEDICAL CARE IF:   You have an oral temperature above 102 F (38.9 C), not controlled by medicine.   You have pain not relieved with medications.   You develop nausea, vomiting, lightheadedness, or have a fainting episode.   You are unable to urinate.   You pass bloody urine or clots.  Document Released: 01/25/2000 Document Revised: 01/16/2011 Document Reviewed: 12/30/2010 [facility] Patient Information 2012 [place], [place].  Eat a yogurt or take probiotic capsule such as Align or [employer] colon health or any similar generic

## 2011-09-03 ENCOUNTER — Encounter: Payer: Self-pay | Admitting: Family Medicine

## 2011-09-03 LAB — URINE CULTURE: Colony Count: NO GROWTH

## 2011-09-04 NOTE — Progress Notes (Signed)
Patient ID: Ian Gonzales, male   DOB: [birth year], 71 y.o.   MRN: [id number] Ian Gonzales [id number] 1940-03-29 09/04/2011      Progress Note-Follow Up  Subjective  Chief Complaint  Chief Complaint  Patient presents with  . Follow-up    1 month  . pain in groin area    stinging and burning in groin area X 2 weeks  . tongue burning    HPI  Patient is a 71 year old Caucasian male who is in today for followup. He developed urinary symptoms. Has been struggling with dysuria and intermittent left groin pain which is similar to pain he had last in diagnosed with prostatitis. No fevers chills. He is just had surgery to help with his cataract removed in its getting well from that. Complaints of some intermittent nausea for the last 2 weeks as well as burning in his tongue. Is noting some mild increased sinus pressure as well as postnasal drip and occasional cough productive of white sputum. No chest pain, palpitations, shortness of breath, GI complaints noted.  Past Medical History  Diagnosis Date  . Hypertension   . GERD (gastroesophageal reflux disease)   . Colon polyps   . Shingles 05-12-2010  . Back pain, lumbosacral   . Preventative health care 03/21/2011  . Sinusitis acute 03/24/2011  . Memory loss 04/16/2011  . Allergic state 05/30/2011  . Acute bronchitis 07/31/2011    Past Surgical History  Procedure Date  . Lumbar laminectomy     twice  . Urethral stricture dilatation     History reviewed. No pertinent family history.  History   Social History  . Marital Status: Single    Spouse Name: N/A    Number of Children: N/A  . Years of Education: N/A   Occupational History  . Not on file.   Social History Main Topics  . Smoking status: Current Everyday Smoker -- 1.0 packs/day    Types: Cigarettes  . Smokeless tobacco: Never Used  . Alcohol Use: Yes     once in a while   . Drug Use: No  . Sexually Active: No   Other Topics Concern  . Not on file   Social  History Narrative  . No narrative on file    Current Outpatient Prescriptions on File Prior to Visit  Medication Sig Dispense Refill  . amLODipine (NORVASC) 5 MG tablet 1 tab qd for high  Blood pressure  30 tablet  11  . aspirin 81 MG tablet Take 81 mg by mouth daily.        Marland Kitchen gabapentin (NEURONTIN) 100 MG capsule       . guaiFENesin (MUCINEX) 600 MG 12 hr tablet Take 1 tablet (600 mg total) by mouth 2 (two) times daily. For chest congestion, take for 10 days, buy over the counter  20 tablet  0  . PROAIR HFA 108 (90 BASE) MCG/ACT inhaler         No Known Allergies  Review of Systems  Review of Systems  Constitutional: Negative for fever and malaise/fatigue.  HENT: Negative for congestion.   Eyes: Negative for discharge.  Respiratory: Negative for shortness of breath.   Cardiovascular: Negative for chest pain, palpitations and leg swelling.  Gastrointestinal: Positive for nausea and abdominal pain. Negative for diarrhea.       Burning in left groin  Genitourinary: Positive for dysuria and frequency.  Musculoskeletal: Negative for falls.  Skin: Negative for rash.  Neurological: Negative for loss of consciousness and headaches.  Endo/Heme/Allergies: Negative for polydipsia.  Psychiatric/Behavioral: Negative for depression and suicidal ideas. The patient is not nervous/anxious and does not have insomnia.     Objective  BP 132/78  Pulse 95  Temp 98.4 F (36.9 C) (Temporal)  Ht 5' 9.5" (1.765 m)  Wt 202 lb (91.627 kg)  BMI 29.40 kg/m2  SpO2 95%  Physical Exam  Physical Exam  Constitutional: He is oriented to person, place, and time and well-developed, well-nourished, and in no distress. No distress.  HENT:  Head: Normocephalic and atraumatic.  Eyes: Conjunctivae are normal.  Neck: Neck supple. No thyromegaly present.  Cardiovascular: Normal rate and regular rhythm.  Exam reveals no gallop.   No murmur heard. Pulmonary/Chest: Effort normal and breath sounds normal. No  respiratory distress.  Abdominal: He exhibits no distension and no mass. There is no tenderness.  Musculoskeletal: He exhibits no edema.  Neurological: He is alert and oriented to person, place, and time.  Skin: Skin is warm.  Psychiatric: Memory, affect and judgment normal.    Lab Results  Component Value Date   TSH 1.28 09/01/2011   Lab Results  Component Value Date   WBC 10.3 09/01/2011   HGB 14.1 09/01/2011   HCT 42.6 09/01/2011   MCV 93.8 09/01/2011   PLT 308.0 09/01/2011   Lab Results  Component Value Date   CREATININE 1.1 09/01/2011   BUN 23 09/01/2011   NA 141 09/01/2011   K 4.4 09/01/2011   CL 107 09/01/2011   CO2 23 09/01/2011   Lab Results  Component Value Date   ALT 21 09/01/2011   AST 16 09/01/2011   ALKPHOS 94 09/01/2011   BILITOT 0.5 09/01/2011   Lab Results  Component Value Date   CHOL 231* 03/21/2011   Lab Results  Component Value Date   HDL 42 03/21/2011   Lab Results  Component Value Date   LDLCALC 170* 03/21/2011   Lab Results  Component Value Date   TRIG 96 03/21/2011   Lab Results  Component Value Date   CHOLHDL 5.5 03/21/2011     Assessment & Plan  TOBACCO USER Patient continues to smoke and denies any interest in quitting.  HYPERTENSION Adequately controlled today.  GERD Adequately controlled. 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 Push clear fluids and report if no improvement

## 2011-09-04 NOTE — Assessment & Plan Note (Signed)
Symptoms of prostatitis have recurred and responded to Ciprofloxacin previously so we will restart today. Push clear fluids and report if no improvement

## 2011-09-04 NOTE — Assessment & Plan Note (Signed)
Patient continues to smoke and denies any interest in quitting.

## 2011-09-04 NOTE — Assessment & Plan Note (Signed)
Adequately controlled. No changes

## 2011-09-04 NOTE — Assessment & Plan Note (Signed)
Adequately controlled today 

## 2011-10-19 ENCOUNTER — Other Ambulatory Visit: Payer: Self-pay | Admitting: Family Medicine

## 2011-10-20 ENCOUNTER — Other Ambulatory Visit: Payer: Self-pay

## 2011-10-20 MED ORDER — AMLODIPINE BESYLATE 5 MG PO TABS: ORAL_TABLET | ORAL | Status: DC

## 2011-10-28 ENCOUNTER — Encounter: Payer: Self-pay | Admitting: Internal Medicine

## 2011-10-31 ENCOUNTER — Ambulatory Visit (INDEPENDENT_AMBULATORY_CARE_PROVIDER_SITE_OTHER): Payer: Medicare Other | Admitting: Family Medicine

## 2011-10-31 ENCOUNTER — Encounter: Payer: Self-pay | Admitting: Family Medicine

## 2011-10-31 VITALS — BP 146/88 | HR 86 | Temp 97.6°F | Ht 69.5 in | Wt 202.0 lb

## 2011-10-31 DIAGNOSIS — J4489 Other specified chronic obstructive pulmonary disease: Secondary | ICD-10-CM

## 2011-10-31 DIAGNOSIS — M545 Low back pain, unspecified: Secondary | ICD-10-CM

## 2011-10-31 DIAGNOSIS — Z23 Encounter for immunization: Secondary | ICD-10-CM

## 2011-10-31 DIAGNOSIS — I10 Essential (primary) hypertension: Secondary | ICD-10-CM

## 2011-10-31 DIAGNOSIS — J449 Chronic obstructive pulmonary disease, unspecified: Secondary | ICD-10-CM

## 2011-10-31 DIAGNOSIS — T7840XA Allergy, unspecified, initial encounter: Secondary | ICD-10-CM

## 2011-10-31 DIAGNOSIS — F17200 Nicotine dependence, unspecified, uncomplicated: Secondary | ICD-10-CM

## 2011-10-31 MED ORDER — LORATADINE 10 MG PO TABS: 10.0000 mg | ORAL_TABLET | Freq: Every day | ORAL | Status: DC | PRN

## 2011-10-31 MED ORDER — ALBUTEROL SULFATE HFA 108 (90 BASE) MCG/ACT IN AERS: 2.0000 | INHALATION_SPRAY | Freq: Four times a day (QID) | RESPIRATORY_TRACT | Status: DC | PRN

## 2011-10-31 MED ORDER — SALINE NASAL SPRAY 0.65 % NA SOLN: 1.0000 | NASAL | Status: DC | PRN

## 2011-10-31 NOTE — Patient Instructions (Addendum)
Sleep Apnea Sleep apnea is a common disorder. The main problem of this disorder is excessive daytime sleepiness and compromised quality of life. This may include social and emotional problems. There are two types of sleep apnea.  Obstructive sleep apnea is when breathing stops due to a blocked airway.   Central sleep apnea is a malfunction of the brain's normal signal to breathe.  SYMPTOMS  Restless sleep.   Falling asleep while driving and/or during the day.   Loss of energy.   Irritability.   Mood or behavior changes.   Loud, heavy snoring.   Morning headaches.   Trouble concentrating.   Forgetfulness.   Anxiety or depression.   Decreased interest in sex.  Not all people with sleep apnea have all of these symptoms. However, people who have a few of these symptoms should visit their caregiver for an evaluation. Problems related to untreated sleep apnea include:  High blood pressure (hypertension).   Coronary artery disease.   Impotence.   Cognitive dysfunction.   Memory loss.  TREATMENT  For mild cases, treatment may include avoiding sleeping on one's back.   For people with nasal congestion, a decongestant may be prescribed.   Patients with obstructive and central apnea should avoid depressants. This includes alcohol, sedatives and narcotics. Weight loss and diet control are encouraged for overweight patients.   Many serious cases of obstructive sleep apnea can be relieved by a treatment called nasal continuous positive airway pressure (nasal CPAP). Nasal CPAP uses a mask-like device and pump that work together to keep the airway open. The pump delivers air pressure during each breath.   Surgery may help some patients by stopping or reducing the narrowing of the airway due to anatomical defects.  PROGNOSIS  Removing the obstruction usually reverses hypertension and cardiac problems. Untreated, sleep apnea sufferers have a tendency to fall asleep during the day.  This is can result in serious accident or loss of ones job. RESEARCH Sleep apnea is currently one of the most active areas of sleep research.  Document Released: 01/17/2002 Document Revised: 01/16/2011 Document Reviewed: 05/15/2005 [employer] Patient Information 2012 [employer]. 

## 2011-11-02 NOTE — Assessment & Plan Note (Signed)
Tolerable symptoms at this time.. Continue minimal use of pain meds prn

## 2011-11-02 NOTE — Assessment & Plan Note (Signed)
Has been having trouble with his ProAir not working for him. Will switch to Proventil

## 2011-11-02 NOTE — Assessment & Plan Note (Signed)
Cutting back by using an ecig, encouraged to continue trying to quit.

## 2011-11-02 NOTE — Assessment & Plan Note (Signed)
Adequately controlled on current meds no further changes.

## 2011-11-02 NOTE — Progress Notes (Signed)
Patient ID: Ian Gonzales, male   DOB: 20-Jan-1941, 71 y.o.   MRN: [id number] Ian Gonzales [id number] [birth year] 11/02/2011      Progress Note-Follow Up  Subjective  Chief Complaint  Chief Complaint  Patient presents with  . Follow-up    2 month    HPI  Patient is a 71 year old Caucasian male who is in today for followup. He continues to struggle with some fatigue and chronic pain in low back with some radicular symptoms down LLE. No incontinence. He hasn't had a cigarette consumption by using a cigarette and has been somewhat successful. Occasional cough, dry. No f/c/HA/CP/palp/SOB/GI or GU c/o. Feels his ProAir has not been helpful recently Past Medical History  Diagnosis Date  . Hypertension   . GERD (gastroesophageal reflux disease)   . Colon polyps   . Shingles 05-12-2010  . Back pain, lumbosacral   . Preventative health care 03/21/2011  . Sinusitis acute 03/24/2011  . Memory loss 04/16/2011  . Allergic state 05/30/2011  . Acute bronchitis 07/31/2011    Past Surgical History  Procedure Date  . Lumbar laminectomy     twice  . Urethral stricture dilatation     No family history on file.  History   Social History  . Marital Status: Single    Spouse Name: N/A    Number of Children: N/A  . Years of Education: N/A   Occupational History  . Not on file.   Social History Main Topics  . Smoking status: Current Every Day Smoker -- 1.0 packs/day    Types: Cigarettes  . Smokeless tobacco: Never Used  . Alcohol Use: Yes     once in a while   . Drug Use: No  . Sexually Active: No   Other Topics Concern  . Not on file   Social History Narrative  . No narrative on file    Current Outpatient Prescriptions on File Prior to Visit  Medication Sig Dispense Refill  . amLODipine (NORVASC) 5 MG tablet 1 tab qd for high  Blood pressure  30 tablet  6  . aspirin 81 MG tablet Take 81 mg by mouth daily.        Marland Kitchen gabapentin (NEURONTIN) 100 MG capsule       . ranitidine  (ZANTAC) 300 MG tablet Take 1 tablet (300 mg total) by mouth at bedtime.  30 tablet  5  . traMADol (ULTRAM) 50 MG tablet Take 1 tablet (50 mg total) by mouth every 8 (eight) hours as needed for pain.  60 tablet  2  . loratadine (CLARITIN) 10 MG tablet Take 1 tablet (10 mg total) by mouth daily as needed for allergies.  30 tablet  11  . sodium chloride (AYR) 0.65 % nasal spray Place 1 spray into the nose as needed for congestion.  30 mL  12    No Known Allergies  Review of Systems  Review of Systems  Constitutional: Positive for malaise/fatigue. Negative for fever.  HENT: Negative for congestion.   Eyes: Negative for discharge.  Respiratory: Negative for shortness of breath.   Cardiovascular: Negative for chest pain, palpitations and leg swelling.  Gastrointestinal: Negative for nausea, abdominal pain and diarrhea.  Genitourinary: Negative for dysuria.  Musculoskeletal: Positive for back pain. Negative for falls.  Skin: Negative for rash.  Neurological: Negative for loss of consciousness and headaches.  Endo/Heme/Allergies: Negative for polydipsia.  Psychiatric/Behavioral: Negative for depression and suicidal ideas. The patient is not nervous/anxious and does not have insomnia.  Objective  BP 146/88  Pulse 86  Temp 97.6 F (36.4 C) (Temporal)  Ht 5' 9.5" (1.765 m)  Wt 202 lb (91.627 kg)  BMI 29.40 kg/m2  SpO2 97%  Physical Exam  Physical Exam  Constitutional: He is oriented to person, place, and time and well-developed, well-nourished, and in no distress. No distress.  HENT:  Head: Normocephalic and atraumatic.  Eyes: Conjunctivae normal are normal.  Neck: Neck supple. No thyromegaly present.  Cardiovascular: Normal rate, regular rhythm and normal heart sounds.   Pulmonary/Chest: Effort normal and breath sounds normal. No respiratory distress.  Abdominal: He exhibits no distension and no mass. There is no tenderness.  Musculoskeletal: He exhibits no edema.    Neurological: He is alert and oriented to person, place, and time.  Skin: Skin is warm.  Psychiatric: Memory, affect and judgment normal.    Lab Results  Component Value Date   TSH 1.28 09/01/2011   Lab Results  Component Value Date   WBC 10.3 09/01/2011   HGB 14.1 09/01/2011   HCT 42.6 09/01/2011   MCV 93.8 09/01/2011   PLT 308.0 09/01/2011   Lab Results  Component Value Date   CREATININE 1.1 09/01/2011   BUN 23 09/01/2011   NA 141 09/01/2011   K 4.4 09/01/2011   CL 107 09/01/2011   CO2 23 09/01/2011   Lab Results  Component Value Date   ALT 21 09/01/2011   AST 16 09/01/2011   ALKPHOS 94 09/01/2011   BILITOT 0.5 09/01/2011   Lab Results  Component Value Date   CHOL 231* 03/21/2011   Lab Results  Component Value Date   HDL 42 03/21/2011   Lab Results  Component Value Date   LDLCALC 170* 03/21/2011   Lab Results  Component Value Date   TRIG 96 03/21/2011   Lab Results  Component Value Date   CHOLHDL 5.5 03/21/2011     Assessment & Plan  HYPERTENSION Adequately controlled on current meds no further changes.  TOBACCO USER Cutting back by using an ecig, encouraged to continue trying to quit.  Back pain, lumbosacral Tolerable symptoms at this time.. Continue minimal use of pain meds prn  COPD Has been having trouble with his ProAir not working for him. Will switch to Proventil

## 2011-11-17 ENCOUNTER — Encounter: Payer: Self-pay | Admitting: Family Medicine

## 2011-11-17 ENCOUNTER — Ambulatory Visit (INDEPENDENT_AMBULATORY_CARE_PROVIDER_SITE_OTHER): Payer: Medicare Other | Admitting: Family Medicine

## 2011-11-17 VITALS — BP 137/81 | HR 76 | Temp 98.6°F | Ht 69.5 in | Wt 201.0 lb

## 2011-11-17 DIAGNOSIS — J4489 Other specified chronic obstructive pulmonary disease: Secondary | ICD-10-CM

## 2011-11-17 DIAGNOSIS — R32 Unspecified urinary incontinence: Secondary | ICD-10-CM

## 2011-11-17 DIAGNOSIS — J449 Chronic obstructive pulmonary disease, unspecified: Secondary | ICD-10-CM

## 2011-11-17 DIAGNOSIS — B029 Zoster without complications: Secondary | ICD-10-CM

## 2011-11-17 DIAGNOSIS — R1032 Left lower quadrant pain: Secondary | ICD-10-CM

## 2011-11-17 DIAGNOSIS — I10 Essential (primary) hypertension: Secondary | ICD-10-CM

## 2011-11-17 DIAGNOSIS — N419 Inflammatory disease of prostate, unspecified: Secondary | ICD-10-CM

## 2011-11-17 DIAGNOSIS — R109 Unspecified abdominal pain: Secondary | ICD-10-CM

## 2011-11-17 LAB — POCT URINALYSIS DIPSTICK
Ketones, UA: NEGATIVE
Leukocytes, UA: NEGATIVE
Nitrite, UA: NEGATIVE
Protein, UA: NEGATIVE
Urobilinogen, UA: 0.2
pH, UA: 6

## 2011-11-17 LAB — RENAL FUNCTION PANEL
Albumin: 4.4 g/dL (ref 3.5–5.2)
BUN: 16 mg/dL (ref 6–23)
Chloride: 105 mEq/L (ref 96–112)
Creatinine, Ser: 0.8 mg/dL (ref 0.4–1.5)
GFR: 99.12 mL/min (ref >60.00)
Glucose, Bld: 96 mg/dL (ref 70–99)
Phosphorus: 2.7 mg/dL (ref 2.3–4.6)

## 2011-11-17 LAB — CBC
MCV: 93.5 fl (ref 78.0–100.0)
RBC: 4.68 Mil/uL (ref 4.22–5.81)
RDW: 14.4 % (ref 11.5–14.6)

## 2011-11-17 LAB — PSA: PSA: 0.17 ng/mL (ref 0.10–4.00)

## 2011-11-17 MED ORDER — CIPROFLOXACIN HCL 500 MG PO TABS: 500.0000 mg | ORAL_TABLET | Freq: Two times a day (BID) | ORAL | Status: DC

## 2011-11-17 NOTE — Assessment & Plan Note (Signed)
Mild elevation with acute pain, will monitor, no changes

## 2011-11-17 NOTE — Patient Instructions (Addendum)
Prostatitis  The prostate gland is about the size and shape of a walnut. It is located just below your bladder. It produces one of the components of semen, which is made up of sperm and the fluids that help nourish and transport it out from the testicles. Prostatitis is redness, soreness, and swelling (inflammation) of the prostate gland.   There are 3 types of prostatitis:   Acute bacterial prostatitis This is the least common type of prostatitis. It starts quickly and usually leads to a bladder infection. It can occur at any age.   Chronic bacterial prostatitis This is a persistent bacterial infection in the prostate.It usually develops from repeated acute bacterial prostatitis or acute bacterial prostatitis that was not properly treated. It can occur in men of any age but is most common in middle-aged men whose prostate has begun to enlarge.   Chronic prostatitis chronic pelvic pain syndrome This is the most common type of prostatitis. It is inflammation of the prostate gland that is not caused by a bacterial infection. The cause is unknown.  CAUSES  The cause of acute and chronic bacterial prostatitis is a bacterial infection. The exact cause of chronic prostatitis and chronic pelvic pain syndrome and asymptomatic inflammatory prostatitis is unknown.   SYMPTOMS   Symptoms can vary depending upon the type of prostatitis that exists. There can also be overlap in symptoms. Possible symptoms for each type of prostatitis are listed below.  Acute bacterial prostatitis   Painful urination.   Fever or chills.   Muscle or joint pains.   Low back pain.   Low abdominal pain.   Inability to empty bladder completely.   Sudden urge to urinate.   Frequent urination.   Difficulty starting urine stream.   Weak urine stream.   Discharge from the urethra.   Dribbling after urination.   Rectal pain.   Pain in the testicles, penis, or tip of the penis.   Pain in the space between the anus and scrotum  (perineum).   Problems with sexual function.   Painful ejaculation.   Bloody semen.  Chronic bacterial prostatitis   The symptoms are similar to those of acute bacterial prostatitis, but they usually are much less severe. Fever, chills, and muscle and joint pain are not associated with chronic bacterial prostatitis.  Chronic prostatitis chronic pelvic pain syndrome   Symptoms typically include a dull ache in the scrotum and the perineum.  DIAGNOSIS   In order to diagnose prostatitis, your caregiver will ask about your symptoms. If acute or chronic bacterial prostatitis is suspected, a urine sample will be taken and tested (urinalysis). This is to see if there is bacteria in your urine. If the urinalysis result is negative for bacteria, your caregiver may use a finger to feel your prostate (digital rectal exam). This exam helps your caregiver determine if your prostate is swollen and tender.  TREATMENT   Treatment for prostatitis depends on the cause. If a bacterial infection is the cause, it can be treated with antibiotic medicine. In cases of chronic bacterial prostatitis, the use of antibiotics for up to 1 month may be necessary. Your caregiver may instruct you to take sitz baths to help relieve pain. A sitz bath is a bath of hot water in which your hips and buttocks are under water.  HOME CARE INSTRUCTIONS    Take all medicines as directed by your caregiver.   Take sitz baths as directed by your caregiver.  SEEK MEDICAL CARE IF:      Your symptoms get worse, not better.   You have a fever.  SEEK IMMEDIATE MEDICAL CARE IF:    You have chills.   You feel nauseous or vomit.   You feel lightheaded or faint.   You are unable to urinate.   You have blood or blood clots in your urine.  Document Released: 01/25/2000 Document Revised: 04/21/2011 Document Reviewed: 12/30/2010  [employer] Patient Information 2013 [employer].

## 2011-11-17 NOTE — Progress Notes (Signed)
Patient ID: Ian Gonzales, male   DOB: 07-Nov-1940, 71 y.o.   MRN: [id number] Ian Gonzales [id number] 1940-04-12 11/17/2011      Progress Note-Follow Up  Subjective  Chief Complaint  Chief Complaint  Patient presents with  . Groin Pain    X 1 week- left  . Rash    X 1 week- left    HPI  Is a 71 year old Caucasian male who is in today complaining of one week history of recurrent left groin pain. He's had trouble off and on this for the last year. Says he's also noted some mild increase in urinary incontinence, frequency, urgency and dysuria. No obvious fevers or chills although he has felt flushed at times. He complains that the skin in his left groin has been somewhat itchy and had a rash and a burning sensation. Bowels are moving normally. He is continuing with reflux but ranitidine is greatly helpful. Gets occasional hiccups but not recently. No chest pain, palpitations, shortness of breath is noted. Overall his respiratory status has improved.  Past Medical History  Diagnosis Date  . Hypertension   . GERD (gastroesophageal reflux disease)   . Colon polyps   . Shingles 05-12-2010  . Back pain, lumbosacral   . Preventative health care 03/21/2011  . Sinusitis acute 03/24/2011  . Memory loss 04/16/2011  . Allergic state 05/30/2011  . Acute bronchitis 07/31/2011    Past Surgical History  Procedure Date  . Lumbar laminectomy     twice  . Urethral stricture dilatation     No family history on file.  History   Social History  . Marital Status: Single    Spouse Name: N/A    Number of Children: N/A  . Years of Education: N/A   Occupational History  . Not on file.   Social History Main Topics  . Smoking status: Current Every Day Smoker -- 1.0 packs/day    Types: Cigarettes  . Smokeless tobacco: Never Used  . Alcohol Use: Yes     once in a while   . Drug Use: No  . Sexually Active: No   Other Topics Concern  . Not on file   Social History Narrative  . No  narrative on file    Current Outpatient Prescriptions on File Prior to Visit  Medication Sig Dispense Refill  . albuterol (PROVENTIL HFA;VENTOLIN HFA) 108 (90 BASE) MCG/ACT inhaler Inhale 2 puffs into the lungs every 6 (six) hours as needed for wheezing or shortness of breath.  1 Inhaler  5  . amLODipine (NORVASC) 5 MG tablet 1 tab qd for high  Blood pressure  30 tablet  6  . aspirin 81 MG tablet Take 81 mg by mouth daily.        Marland Kitchen gabapentin (NEURONTIN) 100 MG capsule       . loratadine (CLARITIN) 10 MG tablet Take 1 tablet (10 mg total) by mouth daily as needed for allergies.  30 tablet  11  . ranitidine (ZANTAC) 300 MG tablet Take 1 tablet (300 mg total) by mouth at bedtime.  30 tablet  5  . sodium chloride (AYR) 0.65 % nasal spray Place 1 spray into the nose as needed for congestion.  30 mL  12  . traMADol (ULTRAM) 50 MG tablet Take 1 tablet (50 mg total) by mouth every 8 (eight) hours as needed for pain.  60 tablet  2    No Known Allergies  Review of Systems  Review of Systems  Constitutional: Negative  for fever and malaise/fatigue.  HENT: Negative for congestion.   Eyes: Negative for discharge.  Respiratory: Negative for shortness of breath.   Cardiovascular: Negative for chest pain, palpitations and leg swelling.  Gastrointestinal: Positive for heartburn and abdominal pain. Negative for nausea, diarrhea, constipation, blood in stool and melena.       Gets occasional hiccups as well, none recently Describes burning in left groin, tender to palp slightly  Genitourinary: Positive for dysuria, urgency and frequency. Negative for hematuria and flank pain.       Recent increase in urinary incontinence  Musculoskeletal: Negative for falls.  Skin: Positive for rash.       Patient c/o rash in left groin  Neurological: Negative for loss of consciousness and headaches.  Endo/Heme/Allergies: Negative for polydipsia.  Psychiatric/Behavioral: Negative for depression and suicidal ideas.  The patient is not nervous/anxious and does not have insomnia.     Objective  BP 137/81  Pulse 76  Temp 98.6 F (37 C) (Temporal)  Ht 5' 9.5" (1.765 m)  Wt 201 lb (91.173 kg)  BMI 29.26 kg/m2  SpO2 95%  Physical Exam  Physical Exam  Constitutional: He is oriented to person, place, and time and well-developed, well-nourished, and in no distress. No distress.  HENT:  Head: Normocephalic and atraumatic.  Eyes: Conjunctivae normal are normal.  Neck: Neck supple. No thyromegaly present.  Cardiovascular: Normal rate, regular rhythm and normal heart sounds.   No murmur heard. Pulmonary/Chest: Effort normal and breath sounds normal. No respiratory distress.  Abdominal: Soft. Bowel sounds are normal. He exhibits no distension and no mass. There is no tenderness.  Genitourinary: Penis normal. No discharge found.       Left groin 1/2 by 1 cm LN enlarged and mildly tender to palp   Musculoskeletal: He exhibits no edema.  Neurological: He is alert and oriented to person, place, and time.  Skin: Skin is warm.  Psychiatric: Memory, affect and judgment normal.    Lab Results  Component Value Date   TSH 1.28 09/01/2011   Lab Results  Component Value Date   WBC 10.3 09/01/2011   HGB 14.1 09/01/2011   HCT 42.6 09/01/2011   MCV 93.8 09/01/2011   PLT 308.0 09/01/2011   Lab Results  Component Value Date   CREATININE 1.1 09/01/2011   BUN 23 09/01/2011   NA 141 09/01/2011   K 4.4 09/01/2011   CL 107 09/01/2011   CO2 23 09/01/2011   Lab Results  Component Value Date   ALT 21 09/01/2011   AST 16 09/01/2011   ALKPHOS 94 09/01/2011   BILITOT 0.5 09/01/2011   Lab Results  Component Value Date   CHOL 231* 03/21/2011   Lab Results  Component Value Date   HDL 42 03/21/2011   Lab Results  Component Value Date   LDLCALC 170* 03/21/2011   Lab Results  Component Value Date   TRIG 96 03/21/2011   Lab Results  Component Value Date   CHOLHDL 5.5 03/21/2011     Assessment &  Plan  HYPERTENSION Mild elevation with acute pain, will monitor, no changes  Abdominal pain Had been better but flared last week there is a small, tender LN noted in left groin, consider prostatis, started on Ciprofloxacin 500 mg po bid and has Tramadol to use as needed. Apply moist heat and call if worse, check cbc, psa, ua. Patient offered imaging today and declines unless he does not improve  Shingles No sign of rash but he does describe  the pain as burning, will consider antiviral if pain persists or rash develops  COPD Unfortunately continues to smoke but his respiratory status has improved.

## 2011-11-17 NOTE — Assessment & Plan Note (Addendum)
Had been better but flared last week there is a small, tender LN noted in left groin, consider prostatis, started on Ciprofloxacin 500 mg po bid and has Tramadol to use as needed. Apply moist heat and call if worse, check cbc, psa, ua. Patient offered imaging today and declines unless he does not improve

## 2011-11-17 NOTE — Assessment & Plan Note (Signed)
Unfortunately continues to smoke but his respiratory status has improved.

## 2011-11-17 NOTE — Assessment & Plan Note (Signed)
No sign of rash but he does describe the pain as burning, will consider antiviral if pain persists or rash develops

## 2011-11-20 ENCOUNTER — Ambulatory Visit (INDEPENDENT_AMBULATORY_CARE_PROVIDER_SITE_OTHER): Payer: Medicare Other | Admitting: Family Medicine

## 2011-11-20 ENCOUNTER — Encounter: Payer: Self-pay | Admitting: Family Medicine

## 2011-11-20 VITALS — BP 151/89 | HR 85 | Temp 98.4°F | Ht 69.5 in

## 2011-11-20 DIAGNOSIS — I10 Essential (primary) hypertension: Secondary | ICD-10-CM

## 2011-11-20 DIAGNOSIS — R109 Unspecified abdominal pain: Secondary | ICD-10-CM

## 2011-11-20 DIAGNOSIS — N419 Inflammatory disease of prostate, unspecified: Secondary | ICD-10-CM

## 2011-11-20 LAB — CBC
MCHC: 32.7 g/dL (ref 30.0–36.0)
MCV: 93 fl (ref 78.0–100.0)
RDW: 14.7 % — ABNORMAL HIGH (ref 11.5–14.6)

## 2011-11-20 LAB — RENAL FUNCTION PANEL
Chloride: 103 mEq/L (ref 96–112)
GFR: 102.02 mL/min (ref >60.00)
Glucose, Bld: 110 mg/dL — ABNORMAL HIGH (ref 70–99)
Phosphorus: 2.4 mg/dL (ref 2.3–4.6)
Potassium: 4 mEq/L (ref 3.5–5.1)
Sodium: 135 mEq/L (ref 135–145)

## 2011-11-20 MED ORDER — LEVOFLOXACIN 250 MG PO TABS: 250.0000 mg | ORAL_TABLET | Freq: Every day | ORAL | Status: DC

## 2011-11-20 MED ORDER — NEBIVOLOL HCL 5 MG PO TABS
5.0000 mg | ORAL_TABLET | Freq: Every day | ORAL | Status: DC
Start: 2011-11-20 — End: 2012-06-07

## 2011-11-20 NOTE — Assessment & Plan Note (Signed)
Once again patient asked to consider CT scan and declines. Agrees to consider this he he does not improve on antibiotics, he acknowledges his pain has improved some on the ciprofloxacin but he is afraid that is what is making him feel more tired and worn out. We will switch to Levaquin 250 mg daily and if he does not improve he will need CT scan

## 2011-11-20 NOTE — Progress Notes (Signed)
Patient ID: Ian Gonzales, male   DOB: 11-Apr-1940, 71 y.o.   MRN: [id number] MIQUAN TANDON [id number] 1940-12-01 11/20/2011      Progress Note-Follow Up  Subjective  Chief Complaint  Chief Complaint  Patient presents with  . Follow-up    BP high- light headed    HPI  Patient is a 71 year old Caucasian male who is for blood pressure check. He reports his abdominal pain is improved since he was last seen and placed on Cipro but overall he feels weak and tired. The testes feeling poorly he checked his blood pressure is running in the 150s and 160s who is here for evaluation. Has low grade headache but denies fevers or chills. Does have some mild congestion and cough as well. No chest pain or palpitations no shortness of breath no GI complaints noted. He is worried that the ciprofloxacin is contributing to his malaise.  Past Medical History  Diagnosis Date  . Hypertension   . GERD (gastroesophageal reflux disease)   . Colon polyps   . Shingles 05-12-2010  . Back pain, lumbosacral   . Preventative health care 03/21/2011  . Sinusitis acute 03/24/2011  . Memory loss 04/16/2011  . Allergic state 05/30/2011  . Acute bronchitis 07/31/2011    Past Surgical History  Procedure Date  . Lumbar laminectomy     twice  . Urethral stricture dilatation     No family history on file.  History   Social History  . Marital Status: Single    Spouse Name: N/A    Number of Children: N/A  . Years of Education: N/A   Occupational History  . Not on file.   Social History Main Topics  . Smoking status: Current Every Day Smoker -- 1.0 packs/day    Types: Cigarettes  . Smokeless tobacco: Never Used  . Alcohol Use: Yes     once in a while   . Drug Use: No  . Sexually Active: No   Other Topics Concern  . Not on file   Social History Narrative  . No narrative on file    Current Outpatient Prescriptions on File Prior to Visit  Medication Sig Dispense Refill  . albuterol (PROVENTIL  HFA;VENTOLIN HFA) 108 (90 BASE) MCG/ACT inhaler Inhale 2 puffs into the lungs every 6 (six) hours as needed for wheezing or shortness of breath.  1 Inhaler  5  . amLODipine (NORVASC) 5 MG tablet 1 tab qd for high  Blood pressure  30 tablet  6  . aspirin 81 MG tablet Take 81 mg by mouth daily.        . ciprofloxacin (CIPRO) 500 MG tablet Take 1 tablet (500 mg total) by mouth 2 (two) times daily.  20 tablet  0  . gabapentin (NEURONTIN) 100 MG capsule       . loratadine (CLARITIN) 10 MG tablet Take 1 tablet (10 mg total) by mouth daily as needed for allergies.  30 tablet  11  . nebivolol (BYSTOLIC) 5 MG tablet Take 1 tablet (5 mg total) by mouth daily.  28 tablet  0  . ranitidine (ZANTAC) 300 MG tablet Take 1 tablet (300 mg total) by mouth at bedtime.  30 tablet  5  . sodium chloride (AYR) 0.65 % nasal spray Place 1 spray into the nose as needed for congestion.  30 mL  12  . traMADol (ULTRAM) 50 MG tablet Take 1 tablet (50 mg total) by mouth every 8 (eight) hours as needed for pain.  60  tablet  2    No Known Allergies  Review of Systems  Review of Systems  Constitutional: Positive for malaise/fatigue. Negative for fever and chills.  HENT: Positive for congestion.   Eyes: Negative for discharge.  Respiratory: Positive for cough. Negative for shortness of breath.   Cardiovascular: Negative for chest pain, palpitations and leg swelling.  Gastrointestinal: Positive for abdominal pain. Negative for nausea, diarrhea, constipation, blood in stool and melena.  Genitourinary: Negative for dysuria, urgency, frequency, hematuria and flank pain.  Musculoskeletal: Negative for falls.  Skin: Negative for rash.  Neurological: Negative for loss of consciousness and headaches.  Endo/Heme/Allergies: Negative for polydipsia.  Psychiatric/Behavioral: Negative for depression and suicidal ideas. The patient is not nervous/anxious and does not have insomnia.     Objective  BP 151/89  Pulse 85  Temp 98.4 F  (36.9 C) (Temporal)  Ht 5' 9.5" (1.765 m)  SpO2 98%  Physical Exam  Physical Exam  Constitutional: He is oriented to person, place, and time and well-developed, well-nourished, and in no distress. No distress.       pale  HENT:  Head: Normocephalic and atraumatic.  Eyes: Conjunctivae normal are normal.  Neck: Neck supple. No thyromegaly present.  Cardiovascular: Normal rate, regular rhythm and normal heart sounds.   No murmur heard. Pulmonary/Chest: Effort normal and breath sounds normal. No respiratory distress.  Abdominal: He exhibits no distension and no mass. There is no tenderness.  Musculoskeletal: He exhibits no edema.  Neurological: He is alert and oriented to person, place, and time.  Skin: Skin is warm.  Psychiatric: Memory, affect and judgment normal.    Lab Results  Component Value Date   TSH 1.28 09/01/2011   Lab Results  Component Value Date   WBC 11.1* 11/20/2011   HGB 14.2 11/20/2011   HCT 43.6 11/20/2011   MCV 93.0 11/20/2011   PLT 357.0 11/20/2011   Lab Results  Component Value Date   CREATININE 0.8 11/20/2011   BUN 13 11/20/2011   NA 135 11/20/2011   K 4.0 11/20/2011   CL 103 11/20/2011   CO2 24 11/20/2011   Lab Results  Component Value Date   ALT 21 09/01/2011   AST 16 09/01/2011   ALKPHOS 94 09/01/2011   BILITOT 0.5 09/01/2011   Lab Results  Component Value Date   CHOL 231* 03/21/2011   Lab Results  Component Value Date   HDL 42 03/21/2011   Lab Results  Component Value Date   LDLCALC 170* 03/21/2011   Lab Results  Component Value Date   TRIG 96 03/21/2011   Lab Results  Component Value Date   CHOLHDL 5.5 03/21/2011     Assessment & Plan  HYPERTENSION bystolic 5 mg daily added today  Abdominal pain Once again patient asked to consider CT scan and declines. Agrees to consider this he he does not improve on antibiotics, he acknowledges his pain has improved some on the ciprofloxacin but he is afraid that is what is making him feel  more tired and worn out. We will switch to Levaquin 250 mg daily and if he does not improve he will need CT scan

## 2011-11-20 NOTE — Patient Instructions (Addendum)

## 2011-11-20 NOTE — Assessment & Plan Note (Signed)
bystolic 5 mg daily added today

## 2011-11-21 NOTE — Progress Notes (Signed)
Quick Note:  Patient Informed and voiced understanding ______ 

## 2011-12-15 ENCOUNTER — Encounter: Payer: Self-pay | Admitting: Family Medicine

## 2011-12-15 ENCOUNTER — Ambulatory Visit (INDEPENDENT_AMBULATORY_CARE_PROVIDER_SITE_OTHER): Payer: Medicare Other | Admitting: Family Medicine

## 2011-12-15 VITALS — BP 132/74 | HR 90 | Temp 98.8°F | Ht 69.5 in | Wt 195.1 lb

## 2011-12-15 DIAGNOSIS — M533 Sacrococcygeal disorders, not elsewhere classified: Secondary | ICD-10-CM

## 2011-12-15 DIAGNOSIS — I10 Essential (primary) hypertension: Secondary | ICD-10-CM

## 2011-12-15 DIAGNOSIS — M545 Low back pain, unspecified: Secondary | ICD-10-CM

## 2011-12-15 DIAGNOSIS — F17200 Nicotine dependence, unspecified, uncomplicated: Secondary | ICD-10-CM

## 2011-12-15 DIAGNOSIS — D72829 Elevated white blood cell count, unspecified: Secondary | ICD-10-CM

## 2011-12-15 DIAGNOSIS — R109 Unspecified abdominal pain: Secondary | ICD-10-CM

## 2011-12-15 HISTORY — DX: Elevated white blood cell count, unspecified: D72.829

## 2011-12-15 NOTE — Patient Instructions (Addendum)
Nicotine Addiction Nicotine can act as both a stimulant (excites/activates) and a sedative (calms/quiets). Immediately after exposure to nicotine, there is a "kick" caused in part by the drug's stimulation of the adrenal glands and resulting discharge of adrenaline (epinephrine). The rush of adrenaline stimulates the body and causes a sudden release of sugar. This means that smokers are always slightly hyperglycemic. Hyperglycemic means that the blood sugar is high, just like in diabetics. Nicotine also decreases the amount of insulin which helps control sugar levels in the body. There is an increase in blood pressure, breathing, and the rate of heart beats.  In addition, nicotine indirectly causes a release of dopamine in the brain that controls pleasure and motivation. A similar reaction is seen with other drugs of abuse, such as cocaine and heroin. This dopamine release is thought to cause the pleasurable sensations when smoking. In some different cases, nicotine can also create a calming effect, depending on sensitivity of the smoker's nervous system and the dose of nicotine taken. WHAT HAPPENS WHEN NICOTINE IS TAKEN FOR LONG PERIODS OF TIME?  Long-term use of nicotine results in addiction. It is difficult to stop.  Repeated use of nicotine creates tolerance. Higher doses of nicotine are needed to get the "kick." When nicotine use is stopped, withdrawal may last a month or more. Withdrawal may begin within a few hours after the last cigarette. Symptoms peak within the first few days and may lessen within a few weeks. For some people, however, symptoms may last for months or longer. Withdrawal symptoms include:   Irritability.  Craving.  Learning and attention deficits.  Sleep disturbances.  Increased appetite. Craving for tobacco may last for 6 months or longer. Many behaviors done while using nicotine can also play a part in the severity of withdrawal symptoms. For some people, the feel,  smell, and sight of a cigarette and the ritual of obtaining, handling, lighting, and smoking the cigarette are closely linked with the pleasure of smoking. When stopped, they also miss the related behaviors which make the withdrawal or craving worse. While nicotine gum and patches may lessen the drug aspects of withdrawal, cravings often persist. WHAT ARE THE MEDICAL CONSEQUENCES OF NICOTINE USE?  Nicotine addiction accounts for one-third of all cancers. The top cancer caused by tobacco is lung cancer. Lung cancer is the number one cancer killer of both men and women.  Smoking is also associated with cancers of the:  Mouth.  Pharynx.  Larynx.  Esophagus.  Stomach.  Pancreas.  Cervix.  Kidney.  Ureter.  Bladder.  Smoking also causes lung diseases such as lasting (chronic) bronchitis and emphysema.  It worsens asthma in adults and children.  Smoking increases the risk of heart disease, including:  Stroke.  Heart attack.  Vascular disease.  Aneurysm.  Passive or secondary smoke can also increase medical risks including:  Asthma in children.  Sudden Infant Death Syndrome (SIDS).  Additionally, dropped cigarettes are the leading cause of residential fire fatalities.  Nicotine poisoning has been reported from accidental ingestion of tobacco products by children and pets. Death usually results in a few minutes from respiratory failure (when a person stops breathing) caused by paralysis. TREATMENT   Medication. Nicotine replacement medicines such as nicotine gum and the patch are used to stop smoking. These medicines gradually lower the dosage of nicotine in the body. These medicines do not contain the carbon monoxide and other toxins found in tobacco smoke.  Hypnotherapy.  Relaxation therapy.  Nicotine Anonymous (a 12-step support   program). Find times and locations in your local yellow pages. Document Released: 10/03/2003 Document Revised: 04/21/2011 Document  Reviewed: 02/24/2007 [employer] Patient Information 2013 [employer].  

## 2011-12-15 NOTE — Assessment & Plan Note (Signed)
Between 1/3 to 12 ppd, discussed need to quit at length, patient is going to keep trying

## 2011-12-15 NOTE — Progress Notes (Signed)
Patient ID: Ian Gonzales, male   DOB: [birth year], 71 y.o.   MRN: [id number] Ian Gonzales [id number] [birth year] 12/15/2011      Progress Note-Follow Up  Subjective  Chief Complaint  Chief Complaint  Patient presents with  . Follow-up    4 week    HPI  Patient is a 71 year old caucasian male in today for followup. He is feeling significantly better. His abdominal pain is resolved. Does have some low back pain still but is marginal. There is no jugular symptoms any further. No GI or GU complaints today. No chest pain, palpitations, shortness of breath, fevers, chills. 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 Says his appetite has returned.  Past Medical History  Diagnosis Date  . Hypertension   . GERD (gastroesophageal reflux disease)   . Colon polyps   . Shingles 05-12-2010  . Back pain, lumbosacral   . Preventative health care 03/21/2011  . Sinusitis acute 03/24/2011  . Memory loss 04/16/2011  . Allergic state 05/30/2011  . Acute bronchitis 07/31/2011  . Leukocytosis 12/15/2011    Past Surgical History  Procedure Date  . Lumbar laminectomy     twice  . Urethral stricture dilatation     No family history on file.  History   Social History  . Marital Status: Single    Spouse Name: N/A    Number of Children: N/A  . Years of Education: N/A   Occupational History  . Not on file.   Social History Main Topics  . Smoking status: Current Every Day Smoker -- 1.0 packs/day    Types: Cigarettes  . Smokeless tobacco: Never Used  . Alcohol Use: Yes     Comment: once in a while   . Drug Use: No  . Sexually Active: No   Other Topics Concern  . Not on file   Social History Narrative  . No narrative on file    Current Outpatient Prescriptions on File Prior to Visit  Medication Sig Dispense Refill  . albuterol (PROVENTIL HFA;VENTOLIN HFA) 108 (90 BASE) MCG/ACT inhaler Inhale 2 puffs into the  lungs every 6 (six) hours as needed for wheezing or shortness of breath.  1 Inhaler  5  . amLODipine (NORVASC) 5 MG tablet 1 tab qd for high  Blood pressure  30 tablet  6  . aspirin 81 MG tablet Take 81 mg by mouth daily.        Marland Kitchen gabapentin (NEURONTIN) 100 MG capsule       . loratadine (CLARITIN) 10 MG tablet Take 1 tablet (10 mg total) by mouth daily as needed for allergies.  30 tablet  11  . nebivolol (BYSTOLIC) 5 MG tablet Take 1 tablet (5 mg total) by mouth daily.  28 tablet  0  . ranitidine (ZANTAC) 300 MG tablet Take 1 tablet (300 mg total) by mouth at bedtime.  30 tablet  5  . sodium chloride (AYR) 0.65 % nasal spray Place 1 spray into the nose as needed for congestion.  30 mL  12  . traMADol (ULTRAM) 50 MG tablet Take 1 tablet (50 mg total) by mouth every 8 (eight) hours as needed for pain.  60 tablet  2    No Known Allergies  Review of Systems  Review of Systems  Constitutional: Positive for malaise/fatigue. Negative for fever.  HENT: Negative for congestion.   Eyes: Negative for  discharge.  Respiratory: Negative for shortness of breath.   Cardiovascular: Negative for chest pain, palpitations and leg swelling.  Gastrointestinal: Negative for nausea, abdominal pain and diarrhea.  Genitourinary: Negative for dysuria.  Musculoskeletal: Negative for falls.  Skin: Negative for rash.  Neurological: Negative for loss of consciousness and headaches.  Endo/Heme/Allergies: Negative for polydipsia.  Psychiatric/Behavioral: Negative for depression and suicidal ideas. The patient is not nervous/anxious and does not have insomnia.     Objective  BP 132/74  Pulse 90  Temp 98.8 F (37.1 C) (Temporal)  Ht 5' 9.5" (1.765 m)  Wt 195 lb 1.9 oz (88.506 kg)  BMI 28.40 kg/m2  SpO2 97%  Physical Exam  Physical Exam  Constitutional: He is oriented to person, place, and time and well-developed, well-nourished, and in no distress. No distress.  HENT:  Head: Normocephalic and atraumatic.    Eyes: Conjunctivae normal are normal.  Neck: Neck supple. No thyromegaly present.  Cardiovascular: Normal rate, regular rhythm and normal heart sounds.   Pulmonary/Chest: Effort normal and breath sounds normal. No respiratory distress.  Abdominal: He exhibits no distension and no mass. There is no tenderness.  Musculoskeletal: He exhibits no edema.  Neurological: He is alert and oriented to person, place, and time.  Skin: Skin is warm.  Psychiatric: Memory, affect and judgment normal.    Lab Results  Component Value Date   TSH 1.28 09/01/2011   Lab Results  Component Value Date   WBC 11.1* 11/20/2011   HGB 14.2 11/20/2011   HCT 43.6 11/20/2011   MCV 93.0 11/20/2011   PLT 357.0 11/20/2011   Lab Results  Component Value Date   CREATININE 0.8 11/20/2011   BUN 13 11/20/2011   NA 135 11/20/2011   K 4.0 11/20/2011   CL 103 11/20/2011   CO2 24 11/20/2011   Lab Results  Component Value Date   ALT 21 09/01/2011   AST 16 09/01/2011   ALKPHOS 94 09/01/2011   BILITOT 0.5 09/01/2011   Lab Results  Component Value Date   CHOL 231* 03/21/2011   Lab Results  Component Value Date   HDL 42 03/21/2011   Lab Results  Component Value Date   LDLCALC 170* 03/21/2011   Lab Results  Component Value Date   TRIG 96 03/21/2011   Lab Results  Component Value Date   CHOLHDL 5.5 03/21/2011     Assessment & Plan  TOBACCO USER Between 1/3 to 12 ppd, discussed need to quit at length, patient is going to keep trying   HYPERTENSION Well controlled,  No changes  Back pain, lumbosacral Resolved, no need for further intervention  Abdominal pain Resolved with recent treatment, bowels moving well, no urinary c/o. Appetite has returned. Just had all of his teeth pulled but is adjusting and does feel his appetite is better  Leukocytosis Repeat cbc today

## 2011-12-15 NOTE — Assessment & Plan Note (Signed)
Well controlled,  No changes.  

## 2011-12-15 NOTE — Assessment & Plan Note (Addendum)
Feels much better, repeat cbc at next visit

## 2011-12-15 NOTE — Assessment & Plan Note (Signed)
Resolved with recent treatment, bowels moving well, no urinary c/o. Appetite has returned. Just had all of his teeth pulled but is adjusting and does feel his appetite is better

## 2011-12-15 NOTE — Assessment & Plan Note (Signed)
Resolved, no need for further intervention

## 2012-01-30 ENCOUNTER — Ambulatory Visit: Payer: Medicare Other | Admitting: Family Medicine

## 2012-02-13 ENCOUNTER — Encounter: Payer: Self-pay | Admitting: Family Medicine

## 2012-02-13 ENCOUNTER — Ambulatory Visit (INDEPENDENT_AMBULATORY_CARE_PROVIDER_SITE_OTHER): Payer: Medicare Other | Admitting: Family Medicine

## 2012-02-13 VITALS — BP 132/77 | HR 91 | Temp 98.3°F | Ht 69.5 in | Wt 193.1 lb

## 2012-02-13 DIAGNOSIS — E785 Hyperlipidemia, unspecified: Secondary | ICD-10-CM

## 2012-02-13 DIAGNOSIS — K219 Gastro-esophageal reflux disease without esophagitis: Secondary | ICD-10-CM

## 2012-02-13 DIAGNOSIS — F17200 Nicotine dependence, unspecified, uncomplicated: Secondary | ICD-10-CM

## 2012-02-13 DIAGNOSIS — M549 Dorsalgia, unspecified: Secondary | ICD-10-CM

## 2012-02-13 DIAGNOSIS — I10 Essential (primary) hypertension: Secondary | ICD-10-CM

## 2012-02-13 DIAGNOSIS — R52 Pain, unspecified: Secondary | ICD-10-CM

## 2012-02-13 DIAGNOSIS — M5430 Sciatica, unspecified side: Secondary | ICD-10-CM

## 2012-02-13 MED ORDER — TRAMADOL HCL 50 MG PO TABS: 50.0000 mg | ORAL_TABLET | Freq: Three times a day (TID) | ORAL | Status: DC | PRN

## 2012-02-13 MED ORDER — METHYLPREDNISOLONE ACETATE 40 MG/ML IJ SUSP
40.0000 mg | Freq: Once | INTRAMUSCULAR | Status: AC
  Administered 2012-02-13: 40 mg via INTRAMUSCULAR

## 2012-02-13 MED ORDER — RANITIDINE HCL 300 MG PO TABS: 300.0000 mg | ORAL_TABLET | Freq: Every day | ORAL | Status: DC

## 2012-02-15 ENCOUNTER — Encounter: Payer: Self-pay | Admitting: Family Medicine

## 2012-02-15 DIAGNOSIS — E785 Hyperlipidemia, unspecified: Secondary | ICD-10-CM

## 2012-02-15 HISTORY — DX: Hyperlipidemia, unspecified: E78.5

## 2012-02-15 NOTE — Assessment & Plan Note (Signed)
Agrees to fasting labs prior to next appt.

## 2012-02-15 NOTE — Progress Notes (Signed)
Patient ID: Ian Gonzales, male   DOB: 11/03/1940, 72 y.o.   MRN: [id number] Ian Gonzales [id number] 1940-05-10 02/15/2012      Progress Note-Follow Up  Subjective  Chief Complaint  Chief Complaint  Patient presents with  . Follow-up    2 month    HPI  Patient is a Caucasian male who is into for followup. Overall he feels much better. His breathing has been good she's cut down to one to 2 cigarettes a day. Has been using a cigarettes intermittently. No fevers or chills. His abdominal and back pain are greatly improved. Uses tramadol infrequently with good results. No recent illness. No GI or GU c/o at this time. His appetite continues to be poor but is slightly improved.  Past Medical History  Diagnosis Date  . Hypertension   . GERD (gastroesophageal reflux disease)   . Colon polyps   . Shingles 05-12-2010  . Back pain, lumbosacral   . Preventative health care 03/21/2011  . Sinusitis acute 03/24/2011  . Memory loss 04/16/2011  . Allergic state 05/30/2011  . Acute bronchitis 07/31/2011  . Leukocytosis 12/15/2011  . Hyperlipidemia 02/15/2012    Past Surgical History  Procedure Date  . Lumbar laminectomy     twice  . Urethral stricture dilatation     No family history on file.  History   Social History  . Marital Status: Single    Spouse Name: N/A    Number of Children: N/A  . Years of Education: N/A   Occupational History  . Not on file.   Social History Main Topics  . Smoking status: Current Every Day Smoker -- 1.0 packs/day    Types: Cigarettes  . Smokeless tobacco: Never Used  . Alcohol Use: Yes     Comment: once in a while   . Drug Use: No  . Sexually Active: No   Other Topics Concern  . Not on file   Social History Narrative  . No narrative on file    Current Outpatient Prescriptions on File Prior to Visit  Medication Sig Dispense Refill  . albuterol (PROVENTIL HFA;VENTOLIN HFA) 108 (90 BASE) MCG/ACT inhaler Inhale 2 puffs into the lungs every 6  (six) hours as needed for wheezing or shortness of breath.  1 Inhaler  5  . amLODipine (NORVASC) 5 MG tablet 1 tab qd for high  Blood pressure  30 tablet  6  . aspirin 81 MG tablet Take 81 mg by mouth daily.        Marland Kitchen gabapentin (NEURONTIN) 100 MG capsule       . loratadine (CLARITIN) 10 MG tablet Take 1 tablet (10 mg total) by mouth daily as needed for allergies.  30 tablet  11  . nebivolol (BYSTOLIC) 5 MG tablet Take 1 tablet (5 mg total) by mouth daily.  28 tablet  0  . sodium chloride (AYR) 0.65 % nasal spray Place 1 spray into the nose as needed for congestion.  30 mL  12    No Known Allergies  Review of Systems  Review of Systems  Constitutional: Negative for fever and malaise/fatigue.  HENT: Negative for congestion.   Eyes: Negative for pain and discharge.  Respiratory: Negative for shortness of breath.   Cardiovascular: Negative for chest pain, palpitations and leg swelling.  Gastrointestinal: Negative for nausea, abdominal pain and diarrhea.  Genitourinary: Negative for dysuria.  Musculoskeletal: Negative for falls.  Skin: Negative for rash.  Neurological: Negative for loss of consciousness and headaches.  Endo/Heme/Allergies:  Negative for polydipsia.  Psychiatric/Behavioral: Negative for depression and suicidal ideas. The patient is not nervous/anxious and does not have insomnia.     Objective  BP 132/77  Pulse 91  Temp 98.3 F (36.8 C) (Temporal)  Ht 5' 9.5" (1.765 m)  Wt 193 lb 1.9 oz (87.599 kg)  BMI 28.11 kg/m2  SpO2 96%  Physical Exam  Physical Exam  Constitutional: He is oriented to person, place, and time and well-developed, well-nourished, and in no distress. No distress.  HENT:  Head: Normocephalic and atraumatic.  Eyes: Conjunctivae normal are normal.  Neck: Neck supple. No thyromegaly present.  Cardiovascular: Normal rate, regular rhythm and normal heart sounds.   No murmur heard. Pulmonary/Chest: Effort normal and breath sounds normal. No  respiratory distress.  Abdominal: He exhibits no distension and no mass. There is no tenderness.  Musculoskeletal: He exhibits no edema.  Neurological: He is alert and oriented to person, place, and time.  Skin: Skin is warm.  Psychiatric: Memory, affect and judgment normal.    Lab Results  Component Value Date   TSH 1.28 09/01/2011   Lab Results  Component Value Date   WBC 11.1* 11/20/2011   HGB 14.2 11/20/2011   HCT 43.6 11/20/2011   MCV 93.0 11/20/2011   PLT 357.0 11/20/2011   Lab Results  Component Value Date   CREATININE 0.8 11/20/2011   BUN 13 11/20/2011   NA 135 11/20/2011   K 4.0 11/20/2011   CL 103 11/20/2011   CO2 24 11/20/2011   Lab Results  Component Value Date   ALT 21 09/01/2011   AST 16 09/01/2011   ALKPHOS 94 09/01/2011   BILITOT 0.5 09/01/2011   Lab Results  Component Value Date   CHOL 231* 03/21/2011   Lab Results  Component Value Date   HDL 42 03/21/2011   Lab Results  Component Value Date   LDLCALC 170* 03/21/2011   Lab Results  Component Value Date   TRIG 96 03/21/2011   Lab Results  Component Value Date   CHOLHDL 5.5 03/21/2011     Assessment & Plan  HYPERTENSION Well controlled today, no changes.  TOBACCO USER Is down to just 1-2 cigarettes a day. Using ecig 3-4 times a day. Encouraged complete cessation  Hyperlipidemia Agrees to fasting labs prior to next appt.   SCIATICA Pain is improved at this time.

## 2012-02-15 NOTE — Assessment & Plan Note (Signed)
Is down to just 1-2 cigarettes a day. Using ecig 3-4 times a day. Encouraged complete cessation

## 2012-02-15 NOTE — Assessment & Plan Note (Signed)
Pain is improved at this time.

## 2012-02-15 NOTE — Assessment & Plan Note (Signed)
Well controlled today, no changes 

## 2012-03-22 ENCOUNTER — Other Ambulatory Visit (INDEPENDENT_AMBULATORY_CARE_PROVIDER_SITE_OTHER): Payer: Medicare Other

## 2012-03-22 DIAGNOSIS — Z0000 Encounter for general adult medical examination without abnormal findings: Secondary | ICD-10-CM

## 2012-03-22 DIAGNOSIS — E785 Hyperlipidemia, unspecified: Secondary | ICD-10-CM

## 2012-03-22 DIAGNOSIS — I10 Essential (primary) hypertension: Secondary | ICD-10-CM

## 2012-03-22 LAB — RENAL FUNCTION PANEL
Albumin: 4.2 g/dL (ref 3.5–5.2)
CO2: 26 mEq/L (ref 19–32)
Calcium: 9.5 mg/dL (ref 8.4–10.5)
Chloride: 104 mEq/L (ref 96–112)
Potassium: 4.6 mEq/L (ref 3.5–5.1)

## 2012-03-22 LAB — HEPATIC FUNCTION PANEL
Bilirubin, Direct: 0.1 mg/dL (ref 0.0–0.3)
Total Bilirubin: 0.5 mg/dL (ref 0.3–1.2)

## 2012-03-22 LAB — CBC
HCT: 43 % (ref 39.0–52.0)
Hemoglobin: 14.4 g/dL (ref 13.0–17.0)
RBC: 4.66 Mil/uL (ref 4.22–5.81)
WBC: 13.4 10*3/uL — ABNORMAL HIGH (ref 4.5–10.5)

## 2012-03-22 LAB — LIPID PANEL
LDL Cholesterol: 135 mg/dL — ABNORMAL HIGH (ref 0–99)
Total CHOL/HDL Ratio: 5
VLDL: 17 mg/dL (ref 0.0–40.0)

## 2012-03-22 NOTE — Progress Notes (Signed)
Labs only

## 2012-03-29 ENCOUNTER — Ambulatory Visit: Payer: Medicare Other | Admitting: Family Medicine

## 2012-04-05 ENCOUNTER — Ambulatory Visit (INDEPENDENT_AMBULATORY_CARE_PROVIDER_SITE_OTHER): Payer: Medicare Other | Admitting: Family Medicine

## 2012-04-05 ENCOUNTER — Encounter: Payer: Self-pay | Admitting: Family Medicine

## 2012-04-05 VITALS — BP 130/84 | HR 87 | Temp 97.7°F | Ht 69.5 in | Wt 192.1 lb

## 2012-04-05 DIAGNOSIS — J019 Acute sinusitis, unspecified: Secondary | ICD-10-CM

## 2012-04-05 DIAGNOSIS — T7840XD Allergy, unspecified, subsequent encounter: Secondary | ICD-10-CM

## 2012-04-05 DIAGNOSIS — J329 Chronic sinusitis, unspecified: Secondary | ICD-10-CM | POA: Insufficient documentation

## 2012-04-05 DIAGNOSIS — Z5189 Encounter for other specified aftercare: Secondary | ICD-10-CM

## 2012-04-05 HISTORY — DX: Chronic sinusitis, unspecified: J32.9

## 2012-04-05 MED ORDER — PROBIOTIC PO CAPS: ORAL_CAPSULE | ORAL | Status: DC

## 2012-04-05 MED ORDER — FLUTICASONE PROPIONATE 50 MCG/ACT NA SUSP: 2.0000 | Freq: Every day | NASAL | Status: DC

## 2012-04-05 MED ORDER — GUAIFENESIN ER 600 MG PO TB12: 600.0000 mg | ORAL_TABLET | Freq: Two times a day (BID) | ORAL | Status: DC

## 2012-04-05 MED ORDER — AMOXICILLIN-POT CLAVULANATE 875-125 MG PO TABS: 1.0000 | ORAL_TABLET | Freq: Two times a day (BID) | ORAL | Status: DC

## 2012-04-05 MED ORDER — METHYLPREDNISOLONE ACETATE 40 MG/ML IJ SUSP
40.0000 mg | Freq: Once | INTRAMUSCULAR | Status: AC
  Administered 2012-04-05: 40 mg via INTRAMUSCULAR

## 2012-04-05 NOTE — Patient Instructions (Addendum)

## 2012-04-05 NOTE — Assessment & Plan Note (Signed)
Started on Augmentin and mucinex, encouraged probiotics and nasal saline

## 2012-04-05 NOTE — Progress Notes (Signed)
Patient ID: Ian Gonzales, male   DOB: 06/12/1940, 72 y.o.   MRN: [id number] BRAYDYN SCHULTES [id number] 07/04/1940 04/05/2012      Progress Note-Follow Up  Subjective  Chief Complaint  Chief Complaint  Patient presents with  . Follow-up    6 week    HPI  Patient is a 72 year old Caucasian male who is in today in followup. He continues to struggle with nasal congestion and chest congestion. It improves and then worsens. Recently he's noted increased sinus pressure and thick discharge. Is requesting a refill on Flonase which he uses at times. Is complaining of chest tightness and discomfort coughing sometimes. No sore throat or fevers. He's had a recent flare in his low back pain and previously has responded to steroid injections. No palpitations or shortness of breath. His appetite is slightly improved and he denies any GI or GU complaints at this time  Past Medical History  Diagnosis Date  . Hypertension   . GERD (gastroesophageal reflux disease)   . Colon polyps   . Shingles 05-12-2010  . Back pain, lumbosacral   . Preventative health care 03/21/2011  . Sinusitis acute 03/24/2011  . Memory loss 04/16/2011  . Allergic state 05/30/2011  . Acute bronchitis 07/31/2011  . Leukocytosis 12/15/2011  . Hyperlipidemia 02/15/2012  . Unspecified sinusitis (chronic) 04/05/2012    Past Surgical History  Procedure Laterality Date  . Lumbar laminectomy      twice  . Urethral stricture dilatation      History reviewed. No pertinent family history.  History   Social History  . Marital Status: Single    Spouse Name: N/A    Number of Children: N/A  . Years of Education: N/A   Occupational History  . Not on file.   Social History Main Topics  . Smoking status: Current Every Day Smoker -- 1.00 packs/day    Types: Cigarettes  . Smokeless tobacco: Never Used  . Alcohol Use: Yes     Comment: once in a while   . Drug Use: No  . Sexually Active: No   Other Topics Concern  . Not on file    Social History Narrative  . No narrative on file    Current Outpatient Prescriptions on File Prior to Visit  Medication Sig Dispense Refill  . albuterol (PROVENTIL HFA;VENTOLIN HFA) 108 (90 BASE) MCG/ACT inhaler Inhale 2 puffs into the lungs every 6 (six) hours as needed for wheezing or shortness of breath.  1 Inhaler  5  . amLODipine (NORVASC) 5 MG tablet 1 tab qd for high  Blood pressure  30 tablet  6  . aspirin 81 MG tablet Take 81 mg by mouth daily.        . ranitidine (ZANTAC) 300 MG tablet Take 1 tablet (300 mg total) by mouth at bedtime.  30 tablet  5  . traMADol (ULTRAM) 50 MG tablet Take 1 tablet (50 mg total) by mouth every 8 (eight) hours as needed for pain.  60 tablet  2  . nebivolol (BYSTOLIC) 5 MG tablet Take 1 tablet (5 mg total) by mouth daily.  28 tablet  0   No current facility-administered medications on file prior to visit.    No Known Allergies  Review of Systems  Review of Systems  Constitutional: Positive for malaise/fatigue. Negative for fever.  HENT: Positive for congestion.   Eyes: Negative for discharge.  Respiratory: Positive for cough and sputum production. Negative for shortness of breath.   Cardiovascular: Negative for chest  pain, palpitations and leg swelling.  Gastrointestinal: Negative for nausea, abdominal pain and diarrhea.  Genitourinary: Negative for dysuria.  Musculoskeletal: Positive for myalgias. Negative for falls.  Skin: Negative for rash.  Neurological: Negative for loss of consciousness and headaches.  Endo/Heme/Allergies: Negative for polydipsia.  Psychiatric/Behavioral: Negative for depression and suicidal ideas. The patient is not nervous/anxious and does not have insomnia.     Objective  BP 130/84  Pulse 87  Temp(Src) 97.7 F (36.5 C) (Oral)  Ht 5' 9.5" (1.765 m)  Wt 192 lb 1.3 oz (87.127 kg)  BMI 27.97 kg/m2  SpO2 97%  Physical Exam  Physical Exam  Constitutional: He is oriented to person, place, and time and  well-developed, well-nourished, and in no distress. No distress.  HENT:  Head: Normocephalic and atraumatic.  Nasal mucosa boggy and erythematous  Eyes: Conjunctivae are normal.  Neck: Neck supple. No thyromegaly present.  Cardiovascular: Normal rate, regular rhythm and normal heart sounds.   No murmur heard. Pulmonary/Chest: Effort normal and breath sounds normal. No respiratory distress.  Abdominal: He exhibits no distension and no mass. There is no tenderness.  Musculoskeletal: He exhibits no edema.  Neurological: He is alert and oriented to person, place, and time.  Skin: Skin is warm.  Psychiatric: Memory, affect and judgment normal.    Lab Results  Component Value Date   TSH 1.37 03/22/2012   Lab Results  Component Value Date   WBC 13.4* 03/22/2012   HGB 14.4 03/22/2012   HCT 43.0 03/22/2012   MCV 92.2 03/22/2012   PLT 338.0 03/22/2012   Lab Results  Component Value Date   CREATININE 0.9 03/22/2012   BUN 18 03/22/2012   NA 140 03/22/2012   K 4.6 03/22/2012   CL 104 03/22/2012   CO2 26 03/22/2012   Lab Results  Component Value Date   ALT 16 03/22/2012   AST 15 03/22/2012   ALKPHOS 106 03/22/2012   BILITOT 0.5 03/22/2012   Lab Results  Component Value Date   CHOL 194 03/22/2012   Lab Results  Component Value Date   HDL 41.90 03/22/2012   Lab Results  Component Value Date   LDLCALC 135* 03/22/2012   Lab Results  Component Value Date   TRIG 85.0 03/22/2012   Lab Results  Component Value Date   CHOLHDL 5 03/22/2012     Assessment & Plan  Sinusitis, acute Started on Augmentin and mucinex, encouraged probiotics and nasal saline  Allergic state Refill given on Flonase  TOBACCO USER usine ecig has cut down from 2 ppd to 1 ppd, encouraged ongoing cessation attempts  Leukocytosis Mild but wbc up some, treat respiratory symptoms with antibiotics and recheck at next visit  Hyperlipidemia Improved since last blood draw, no new therapy today  GERD Infrequent  symptoms with diet changes and Ranitidine prn

## 2012-04-05 NOTE — Assessment & Plan Note (Signed)
Improved since last blood draw, no new therapy today

## 2012-04-05 NOTE — Assessment & Plan Note (Signed)
Refill given on Flonase. °

## 2012-04-05 NOTE — Assessment & Plan Note (Signed)
Mild but wbc up some, treat respiratory symptoms with antibiotics and recheck at next visit

## 2012-04-05 NOTE — Assessment & Plan Note (Signed)
Infrequent symptoms with diet changes and Ranitidine prn

## 2012-04-05 NOTE — Assessment & Plan Note (Signed)
usine ecig has cut down from 2 ppd to 1 ppd, encouraged ongoing cessation attempts

## 2012-05-18 ENCOUNTER — Other Ambulatory Visit: Payer: Self-pay | Admitting: Family Medicine

## 2012-06-07 ENCOUNTER — Encounter: Payer: Self-pay | Admitting: Family Medicine

## 2012-06-07 ENCOUNTER — Ambulatory Visit (INDEPENDENT_AMBULATORY_CARE_PROVIDER_SITE_OTHER): Payer: Medicare Other | Admitting: Family Medicine

## 2012-06-07 VITALS — BP 142/80 | HR 78 | Temp 98.3°F | Wt 195.2 lb

## 2012-06-07 DIAGNOSIS — F17200 Nicotine dependence, unspecified, uncomplicated: Secondary | ICD-10-CM

## 2012-06-07 DIAGNOSIS — I10 Essential (primary) hypertension: Secondary | ICD-10-CM

## 2012-06-07 DIAGNOSIS — M533 Sacrococcygeal disorders, not elsewhere classified: Secondary | ICD-10-CM

## 2012-06-07 DIAGNOSIS — M545 Low back pain: Secondary | ICD-10-CM

## 2012-06-07 DIAGNOSIS — R52 Pain, unspecified: Secondary | ICD-10-CM

## 2012-06-07 LAB — HEPATIC FUNCTION PANEL
ALT: 12 U/L (ref 0–53)
AST: 11 U/L (ref 0–37)
Albumin: 4.2 g/dL (ref 3.5–5.2)
Total Bilirubin: 0.4 mg/dL (ref 0.3–1.2)
Total Protein: 7 g/dL (ref 6.0–8.3)

## 2012-06-07 MED ORDER — TRAMADOL HCL 50 MG PO TABS: 50.0000 mg | ORAL_TABLET | Freq: Three times a day (TID) | ORAL | Status: DC | PRN

## 2012-06-07 NOTE — Patient Instructions (Addendum)
Nicotine Addiction Nicotine can act as both a stimulant (excites/activates) and a sedative (calms/quiets). Immediately after exposure to nicotine, there is a "kick" caused in part by the drug's stimulation of the adrenal glands and resulting discharge of adrenaline (epinephrine). The rush of adrenaline stimulates the body and causes a sudden release of sugar. This means that smokers are always slightly hyperglycemic. Hyperglycemic means that the blood sugar is high, just like in diabetics. Nicotine also decreases the amount of insulin which helps control sugar levels in the body. There is an increase in blood pressure, breathing, and the rate of heart beats.  In addition, nicotine indirectly causes a release of dopamine in the brain that controls pleasure and motivation. A similar reaction is seen with other drugs of abuse, such as cocaine and heroin. This dopamine release is thought to cause the pleasurable sensations when smoking. In some different cases, nicotine can also create a calming effect, depending on sensitivity of the smoker's nervous system and the dose of nicotine taken. WHAT HAPPENS WHEN NICOTINE IS TAKEN FOR LONG PERIODS OF TIME?  Long-term use of nicotine results in addiction. It is difficult to stop.  Repeated use of nicotine creates tolerance. Higher doses of nicotine are needed to get the "kick." When nicotine use is stopped, withdrawal may last a month or more. Withdrawal may begin within a few hours after the last cigarette. Symptoms peak within the first few days and may lessen within a few weeks. For some people, however, symptoms may last for months or longer. Withdrawal symptoms include:   Irritability.  Craving.  Learning and attention deficits.  Sleep disturbances.  Increased appetite. Craving for tobacco may last for 6 months or longer. Many behaviors done while using nicotine can also play a part in the severity of withdrawal symptoms. For some people, the feel,  smell, and sight of a cigarette and the ritual of obtaining, handling, lighting, and smoking the cigarette are closely linked with the pleasure of smoking. When stopped, they also miss the related behaviors which make the withdrawal or craving worse. While nicotine gum and patches may lessen the drug aspects of withdrawal, cravings often persist. WHAT ARE THE MEDICAL CONSEQUENCES OF NICOTINE USE?  Nicotine addiction accounts for one-third of all cancers. The top cancer caused by tobacco is lung cancer. Lung cancer is the number one cancer killer of both men and women.  Smoking is also associated with cancers of the:  Mouth.  Pharynx.  Larynx.  Esophagus.  Stomach.  Pancreas.  Cervix.  Kidney.  Ureter.  Bladder.  Smoking also causes lung diseases such as lasting (chronic) bronchitis and emphysema.  It worsens asthma in adults and children.  Smoking increases the risk of heart disease, including:  Stroke.  Heart attack.  Vascular disease.  Aneurysm.  Passive or secondary smoke can also increase medical risks including:  Asthma in children.  Sudden Infant Death Syndrome (SIDS).  Additionally, dropped cigarettes are the leading cause of residential fire fatalities.  Nicotine poisoning has been reported from accidental ingestion of tobacco products by children and pets. Death usually results in a few minutes from respiratory failure (when a person stops breathing) caused by paralysis. TREATMENT   Medication. Nicotine replacement medicines such as nicotine gum and the patch are used to stop smoking. These medicines gradually lower the dosage of nicotine in the body. These medicines do not contain the carbon monoxide and other toxins found in tobacco smoke.  Hypnotherapy.  Relaxation therapy.  Nicotine Anonymous (a 12-step support   program). Find times and locations in your local yellow pages. Document Released: 10/03/2003 Document Revised: 04/21/2011 Document  Reviewed: 02/24/2007 [facility] Patient Information 2013 [place], [place].

## 2012-06-07 NOTE — Assessment & Plan Note (Signed)
Offered referral to Ortho today but patient declines for now. Will notify us if symptoms worsen, given refill on tramadol today.

## 2012-06-07 NOTE — Assessment & Plan Note (Signed)
Well controlled, no changes today 

## 2012-06-07 NOTE — Assessment & Plan Note (Signed)
Unfortunately continues to smoke, discussed need for cessation

## 2012-06-07 NOTE — Progress Notes (Signed)
Patient ID: Ian Gonzales, male   DOB: 05-Jun-1940, 72 y.o.   MRN: [id number] Ian Gonzales [id number] 1940/11/08 06/07/2012      Progress Note-Follow Up  Subjective  Chief Complaint  Chief Complaint  Patient presents with  . Follow-up    2 month    HPI  Patient is a 72 year old Caucasian male who is in today for followup. He continues to struggle with low back pain and some radicular symptoms into the groin and down the legs as well it times. No other recent illness. No fevers or chills. No chest pain palpitations, shortness of breath, GI or GU concerns. Is moving his bowels routinely. No malaise or myalgias.  Past Medical History  Diagnosis Date  . Hypertension   . GERD (gastroesophageal reflux disease)   . Colon polyps   . Shingles 05-12-2010  . Back pain, lumbosacral   . Preventative health care 03/21/2011  . Sinusitis acute 03/24/2011  . Memory loss 04/16/2011  . Allergic state 05/30/2011  . Acute bronchitis 07/31/2011  . Leukocytosis 12/15/2011  . Hyperlipidemia 02/15/2012  . Unspecified sinusitis (chronic) 04/05/2012    Past Surgical History  Procedure Laterality Date  . Lumbar laminectomy      twice  . Urethral stricture dilatation      History reviewed. No pertinent family history.  History   Social History  . Marital Status: Single    Spouse Name: N/A    Number of Children: N/A  . Years of Education: N/A   Occupational History  . Not on file.   Social History Main Topics  . Smoking status: Current Every Day Smoker -- 1.00 packs/day    Types: Cigarettes  . Smokeless tobacco: Never Used  . Alcohol Use: Yes     Comment: once in a while   . Drug Use: No  . Sexually Active: No   Other Topics Concern  . Not on file   Social History Narrative  . No narrative on file    Current Outpatient Prescriptions on File Prior to Visit  Medication Sig Dispense Refill  . albuterol (PROVENTIL HFA;VENTOLIN HFA) 108 (90 BASE) MCG/ACT inhaler Inhale 2 puffs into the  lungs every 6 (six) hours as needed for wheezing or shortness of breath.  1 Inhaler  5  . amLODipine (NORVASC) 5 MG tablet take 1 tablet by mouth once daily for high blood pressure  30 tablet  6  . aspirin 81 MG tablet Take 81 mg by mouth daily.        . fluticasone (FLONASE) 50 MCG/ACT nasal spray Place 2 sprays into the nose daily.  16 g  6  . gabapentin (NEURONTIN) 100 MG capsule Take 100 mg by mouth 3 (three) times daily.      Marland Kitchen guaiFENesin (MUCINEX) 600 MG 12 hr tablet Take 1 tablet (600 mg total) by mouth 2 (two) times daily.  20 tablet  0  . PROBIOTIC CAPS Start a generic probiotic daily for the next month      . ranitidine (ZANTAC) 300 MG tablet Take 1 tablet (300 mg total) by mouth at bedtime.  30 tablet  5   No current facility-administered medications on file prior to visit.    No Known Allergies  Review of Systems   Review of Systems  Constitutional: Positive for malaise/fatigue. Negative for fever.  HENT: Negative for congestion.   Eyes: Negative for discharge.  Respiratory: Negative for shortness of breath.   Cardiovascular: Negative for chest pain, palpitations and leg swelling.  Gastrointestinal: Negative for nausea, abdominal pain and diarrhea.  Genitourinary: Negative for dysuria.  Musculoskeletal: Positive for back pain and joint pain. Negative for falls.  Skin: Negative for rash.  Neurological: Negative for loss of consciousness and headaches.  Endo/Heme/Allergies: Negative for polydipsia.  Psychiatric/Behavioral: Positive for depression. Negative for suicidal ideas. The patient is not nervous/anxious and does not have insomnia.     Objective  BP 142/80  Pulse 78  Temp(Src) 98.3 F (36.8 C) (Oral)  Wt 195 lb 4 oz (88.565 kg)  BMI 28.43 kg/m2  SpO2 98%  Physical Exam  Physical Exam  Constitutional: He is oriented to person, place, and time and well-developed, well-nourished, and in no distress. No distress.  HENT:  Head: Normocephalic and atraumatic.   Eyes: Conjunctivae are normal.  Neck: Neck supple. No thyromegaly present.  Cardiovascular: Normal rate, regular rhythm and normal heart sounds.   Pulmonary/Chest: Effort normal and breath sounds normal. No respiratory distress.  Abdominal: He exhibits no distension and no mass. There is no tenderness.  Musculoskeletal: He exhibits no edema.  Neurological: He is alert and oriented to person, place, and time.  Skin: Skin is warm.  Psychiatric: Memory, affect and judgment normal.    Lab Results  Component Value Date   TSH 1.37 03/22/2012   Lab Results  Component Value Date   WBC 13.4* 03/22/2012   HGB 14.4 03/22/2012   HCT 43.0 03/22/2012   MCV 92.2 03/22/2012   PLT 338.0 03/22/2012   Lab Results  Component Value Date   CREATININE 0.9 03/22/2012   BUN 18 03/22/2012   NA 140 03/22/2012   K 4.6 03/22/2012   CL 104 03/22/2012   CO2 26 03/22/2012   Lab Results  Component Value Date   ALT 16 03/22/2012   AST 15 03/22/2012   ALKPHOS 106 03/22/2012   BILITOT 0.5 03/22/2012   Lab Results  Component Value Date   CHOL 194 03/22/2012   Lab Results  Component Value Date   HDL 41.90 03/22/2012   Lab Results  Component Value Date   LDLCALC 135* 03/22/2012   Lab Results  Component Value Date   TRIG 85.0 03/22/2012   Lab Results  Component Value Date   CHOLHDL 5 03/22/2012     Assessment & Plan  HYPERTENSION Well controlled, no changes today  TOBACCO USER Unfortunately continues to smoke, discussed need for cessation  Back pain, lumbosacral Offered referral to Ortho today but patient declines for now. Will notify us if symptoms worsen, given refill on tramadol today.

## 2012-06-10 ENCOUNTER — Encounter: Payer: Self-pay | Admitting: Internal Medicine

## 2012-06-14 ENCOUNTER — Encounter: Payer: Self-pay | Admitting: Internal Medicine

## 2012-07-01 ENCOUNTER — Encounter (HOSPITAL_BASED_OUTPATIENT_CLINIC_OR_DEPARTMENT_OTHER): Payer: Self-pay | Admitting: *Deleted

## 2012-07-01 ENCOUNTER — Emergency Department (HOSPITAL_BASED_OUTPATIENT_CLINIC_OR_DEPARTMENT_OTHER): Payer: Medicare Other

## 2012-07-01 ENCOUNTER — Emergency Department (HOSPITAL_BASED_OUTPATIENT_CLINIC_OR_DEPARTMENT_OTHER)
Admission: EM | Admit: 2012-07-01 | Discharge: 2012-07-01 | Disposition: A | Discharge status: Home / Self Care | Payer: Medicare Other | Attending: Emergency Medicine | Admitting: Emergency Medicine

## 2012-07-01 DIAGNOSIS — M5136 Other intervertebral disc degeneration, lumbar region: Secondary | ICD-10-CM

## 2012-07-01 DIAGNOSIS — Z79899 Other long term (current) drug therapy: Secondary | ICD-10-CM | POA: Insufficient documentation

## 2012-07-01 DIAGNOSIS — F17200 Nicotine dependence, unspecified, uncomplicated: Secondary | ICD-10-CM | POA: Insufficient documentation

## 2012-07-01 DIAGNOSIS — IMO0002 Reserved for concepts with insufficient information to code with codable children: Secondary | ICD-10-CM | POA: Insufficient documentation

## 2012-07-01 DIAGNOSIS — Z7982 Long term (current) use of aspirin: Secondary | ICD-10-CM | POA: Insufficient documentation

## 2012-07-01 DIAGNOSIS — K219 Gastro-esophageal reflux disease without esophagitis: Secondary | ICD-10-CM | POA: Insufficient documentation

## 2012-07-01 DIAGNOSIS — Z9889 Other specified postprocedural states: Secondary | ICD-10-CM | POA: Insufficient documentation

## 2012-07-01 DIAGNOSIS — Z8709 Personal history of other diseases of the respiratory system: Secondary | ICD-10-CM | POA: Insufficient documentation

## 2012-07-01 DIAGNOSIS — Z8619 Personal history of other infectious and parasitic diseases: Secondary | ICD-10-CM | POA: Insufficient documentation

## 2012-07-01 DIAGNOSIS — Z860100 Personal history of colon polyps, unspecified: Secondary | ICD-10-CM | POA: Insufficient documentation

## 2012-07-01 DIAGNOSIS — I10 Essential (primary) hypertension: Secondary | ICD-10-CM | POA: Insufficient documentation

## 2012-07-01 DIAGNOSIS — Z862 Personal history of diseases of the blood and blood-forming organs and certain disorders involving the immune mechanism: Secondary | ICD-10-CM | POA: Insufficient documentation

## 2012-07-01 DIAGNOSIS — Z8639 Personal history of other endocrine, nutritional and metabolic disease: Secondary | ICD-10-CM | POA: Insufficient documentation

## 2012-07-01 DIAGNOSIS — M51379 Other intervertebral disc degeneration, lumbosacral region without mention of lumbar back pain or lower extremity pain: Secondary | ICD-10-CM | POA: Insufficient documentation

## 2012-07-01 DIAGNOSIS — M5137 Other intervertebral disc degeneration, lumbosacral region: Secondary | ICD-10-CM | POA: Insufficient documentation

## 2012-07-01 MED ORDER — HYDROCODONE-ACETAMINOPHEN 5-325 MG PO TABS: 1.0000 | ORAL_TABLET | ORAL | Status: DC | PRN

## 2012-07-01 MED ORDER — MELOXICAM 7.5 MG PO TABS: 7.5000 mg | ORAL_TABLET | Freq: Every day | ORAL | Status: DC

## 2012-07-01 NOTE — ED Notes (Signed)
Pt amb to room 11 with quick steady gait in nad. Pt reports chronic back pain "for years", states he was seen by dr. Rogelia Rohrer this morning and sent here "for xrays", pt denies any injury or trauma, states his pain has increased since Sunday.

## 2012-07-01 NOTE — ED Provider Notes (Signed)
History     CSN: [id number]  Arrival date & time 07/01/12  1000   First MD Initiated Contact with Patient 07/01/12 1110      Chief Complaint  Patient presents with  . Back Pain    (Consider location/radiation/quality/duration/timing/severity/associated sxs/prior treatment) Patient is a 72 y.o. male presenting with back pain. The history is provided by the patient. No language interpreter was used.  Back Pain Location:  Lumbar spine (Pt says his back is hurting.  He has had 2 prior back operations, in 1974 and 1980.  There has been some back pain over the past couple of years.  It got worse this past Sunday, 4 days ago, without any injury.) Quality:  Aching Radiates to: He also gets some pain in his calves. Pain severity:  Severe (Pain rated at an 8 by pt.) Onset quality:  Gradual Duration: Recurrent over 2 years, worse in past few days. Timing:  Constant Progression:  Worsening Chronicity:  Chronic Context: not falling, not lifting heavy objects and not recent injury   Relieved by:  Nothing Worsened by:  Nothing tried Ineffective treatments:  None tried Associated symptoms: no fever     Past Medical History  Diagnosis Date  . Hypertension   . GERD (gastroesophageal reflux disease)   . Colon polyps   . Shingles 05-12-2010  . Back pain, lumbosacral   . Preventative health care 03/21/2011  . Sinusitis acute 03/24/2011  . Memory loss 04/16/2011  . Allergic state 05/30/2011  . Acute bronchitis 07/31/2011  . Leukocytosis 12/15/2011  . Hyperlipidemia 02/15/2012  . Unspecified sinusitis (chronic) 04/05/2012    Past Surgical History  Procedure Laterality Date  . Lumbar laminectomy      twice  . Urethral stricture dilatation      History reviewed. No pertinent family history.  History  Substance Use Topics  . Smoking status: Current Every Day Smoker -- 1.00 packs/day    Types: Cigarettes  . Smokeless tobacco: Never Used  . Alcohol Use: Yes     Comment: once in a while        Review of Systems  Constitutional: Negative for fever and chills.  HENT: Negative.   Eyes: Negative.   Respiratory: Negative.   Cardiovascular: Negative.   Gastrointestinal: Negative.   Genitourinary: Negative.   Musculoskeletal: Positive for back pain.  Skin: Negative.   Neurological: Negative.   Psychiatric/Behavioral: Negative.     Allergies  Review of patient's allergies indicates no known allergies.  Home Medications   Current Outpatient Rx  Name  Route  Sig  Dispense  Refill  . albuterol (PROVENTIL HFA;VENTOLIN HFA) 108 (90 BASE) MCG/ACT inhaler   Inhalation   Inhale 2 puffs into the lungs every 6 (six) hours as needed for wheezing or shortness of breath.   1 Inhaler   5   . amLODipine (NORVASC) 5 MG tablet      take 1 tablet by mouth once daily for high blood pressure   30 tablet   6   . aspirin 81 MG tablet   Oral   Take 81 mg by mouth daily.           . fluticasone (FLONASE) 50 MCG/ACT nasal spray   Nasal   Place 2 sprays into the nose daily.   16 g   6   . gabapentin (NEURONTIN) 100 MG capsule   Oral   Take 100 mg by mouth 3 (three) times daily.         Marland Kitchen guaiFENesin (  MUCINEX) 600 MG 12 hr tablet   Oral   Take 1 tablet (600 mg total) by mouth 2 (two) times daily.   20 tablet   0   . HYDROcodone-acetaminophen (NORCO/VICODIN) 5-325 MG per tablet   Oral   Take 1 tablet by mouth every 4 (four) hours as needed for pain.   20 tablet   0   . meloxicam (MOBIC) 7.5 MG tablet   Oral   Take 1 tablet (7.5 mg total) by mouth daily.   20 tablet   0   . PROBIOTIC CAPS      Start a generic probiotic daily for the next month         . ranitidine (ZANTAC) 300 MG tablet   Oral   Take 1 tablet (300 mg total) by mouth at bedtime.   30 tablet   5   . traMADol (ULTRAM) 50 MG tablet   Oral   Take 1 tablet (50 mg total) by mouth every 8 (eight) hours as needed for pain.   60 tablet   2     BP 140/79  Pulse 82  Temp(Src) 98.2 F  (36.8 C) (Oral)  Resp 16  Ht 5\' 6"  (1.676 m)  Wt 185 lb (83.915 kg)  BMI 29.87 kg/m2  SpO2 99%  Physical Exam  Nursing note and vitals reviewed. Constitutional: He is oriented to person, place, and time. He appears well-developed and well-nourished. No distress.  Pleasant elderly man, no distress noted when I saw him.  HENT:  Head: Normocephalic and atraumatic.  Eyes: Conjunctivae and EOM are normal. Pupils are equal, round, and reactive to light.  Neck: Normal range of motion. Neck supple.  Cardiovascular: Normal rate, regular rhythm and normal heart sounds.   Pulmonary/Chest: Effort normal and breath sounds normal.  Abdominal: Soft. Bowel sounds are normal.  No pulsatile mass in epigastrium.  Musculoskeletal:  Pain localized to lumbar region.  Old laminectomy scar.  No bony deformity or point tenderness.  Neurological: He is alert and oriented to person, place, and time.  No sensory or motor deficit.  Skin: Skin is warm and dry.  Psychiatric: He has a normal mood and affect. His behavior is normal.    ED Course  Procedures (including critical care time)  Labs Reviewed - No data to display Dg Lumbar Spine Complete  07/01/2012   *RADIOLOGY REPORT*  Clinical Data: Back pain  LUMBAR SPINE - COMPLETE 4+ VIEW  Comparison: 05/13/2011  Findings: There is a curvature of the lumbar spine which is convex towards the right.  There is a mild anterolisthesis of L4 and L5.  Multilevel disc space narrowing and ventral endplate spurring is noted.  This is most severe at L2-3 and L3-4.  The vertebral body heights are maintained.  No compression deformity noted.  Calcified atherosclerotic disease affects the abdominal aorta.  IMPRESSION: Scoliosis and multilevel degenerative disc disease.  Atherosclerotic disease noted.   Original Report Authenticated By: Signa Kell, M.D.   X-ray showed degenerative changes.  Rx meloxicam for arthritis, hydrocodone-acetaminophen for pain.  F/U with Dr. Abner Greenspan in  her office.  If symptoms persist despite conservative Rx, may need MRI to check for HNP or spinal stenosis.  1. Degenerative disc disease, lumbar        Carleene Cooper III, MD 07/01/12 2226

## 2012-07-06 ENCOUNTER — Ambulatory Visit (INDEPENDENT_AMBULATORY_CARE_PROVIDER_SITE_OTHER): Payer: Medicare Other | Admitting: Family Medicine

## 2012-07-06 ENCOUNTER — Encounter: Payer: Self-pay | Admitting: Family Medicine

## 2012-07-06 VITALS — BP 148/78 | HR 88 | Temp 98.0°F | Ht 69.5 in | Wt 193.0 lb

## 2012-07-06 DIAGNOSIS — I10 Essential (primary) hypertension: Secondary | ICD-10-CM

## 2012-07-06 DIAGNOSIS — M533 Sacrococcygeal disorders, not elsewhere classified: Secondary | ICD-10-CM

## 2012-07-06 DIAGNOSIS — M545 Low back pain, unspecified: Secondary | ICD-10-CM

## 2012-07-06 DIAGNOSIS — M549 Dorsalgia, unspecified: Secondary | ICD-10-CM

## 2012-07-06 MED ORDER — METHYLPREDNISOLONE ACETATE 40 MG/ML IJ SUSP
40.0000 mg | Freq: Once | INTRAMUSCULAR | Status: AC
  Administered 2012-07-06: 40 mg via INTRAMUSCULAR

## 2012-07-06 MED ORDER — METHYLPREDNISOLONE 4 MG PO KIT: PACK | ORAL | Status: DC

## 2012-07-06 NOTE — Assessment & Plan Note (Signed)
Improved on recheck no changes today with acute pain, reassess next month

## 2012-07-06 NOTE — Progress Notes (Signed)
Patient ID: Ian Gonzales, male   DOB: 06-18-1940, 72 y.o.   MRN: [id number] Ian Gonzales [id number] 19-Feb-1940 07/06/2012      Progress Note-Follow Up  Subjective  Chief Complaint  Chief Complaint  Patient presents with  . Follow-up    ED f/up    HPI  Patient is a 72 year old Caucasian male who is in today for ER followup. He presented to our emergency room the last week with significant lower abdominal pain. He had in the past but this was new and worsening. His pain is largely filled is also radicular down the legs as well. Has had trouble with this off and on the past. Is worse with prolonged standing and improved with sitting. No GI or GU complaints. No fevers chills.  Past Medical History  Diagnosis Date  . Hypertension   . GERD (gastroesophageal reflux disease)   . Colon polyps   . Shingles 05-12-2010  . Back pain, lumbosacral   . Preventative health care 03/21/2011  . Sinusitis acute 03/24/2011  . Memory loss 04/16/2011  . Allergic state 05/30/2011  . Acute bronchitis 07/31/2011  . Leukocytosis 12/15/2011  . Hyperlipidemia 02/15/2012  . Unspecified sinusitis (chronic) 04/05/2012    Past Surgical History  Procedure Laterality Date  . Lumbar laminectomy      twice  . Urethral stricture dilatation      History reviewed. No pertinent family history.  History   Social History  . Marital Status: Single    Spouse Name: N/A    Number of Children: N/A  . Years of Education: N/A   Occupational History  . Not on file.   Social History Main Topics  . Smoking status: Current Every Day Smoker -- 1.00 packs/day    Types: Cigarettes  . Smokeless tobacco: Never Used  . Alcohol Use: Yes     Comment: once in a while   . Drug Use: No  . Sexually Active: No   Other Topics Concern  . Not on file   Social History Narrative  . No narrative on file    Current Outpatient Prescriptions on File Prior to Visit  Medication Sig Dispense Refill  . albuterol (PROVENTIL  HFA;VENTOLIN HFA) 108 (90 BASE) MCG/ACT inhaler Inhale 2 puffs into the lungs every 6 (six) hours as needed for wheezing or shortness of breath.  1 Inhaler  5  . amLODipine (NORVASC) 5 MG tablet take 1 tablet by mouth once daily for high blood pressure  30 tablet  6  . aspirin 81 MG tablet Take 81 mg by mouth daily.        . fluticasone (FLONASE) 50 MCG/ACT nasal spray Place 2 sprays into the nose daily.  16 g  6  . gabapentin (NEURONTIN) 100 MG capsule Take 100 mg by mouth 3 (three) times daily.      Marland Kitchen guaiFENesin (MUCINEX) 600 MG 12 hr tablet Take 1 tablet (600 mg total) by mouth 2 (two) times daily.  20 tablet  0  . HYDROcodone-acetaminophen (NORCO/VICODIN) 5-325 MG per tablet Take 1 tablet by mouth every 4 (four) hours as needed for pain.  20 tablet  0  . meloxicam (MOBIC) 7.5 MG tablet Take 1 tablet (7.5 mg total) by mouth daily.  20 tablet  0  . PROBIOTIC CAPS Start a generic probiotic daily for the next month      . ranitidine (ZANTAC) 300 MG tablet Take 1 tablet (300 mg total) by mouth at bedtime.  30 tablet  5  .  traMADol (ULTRAM) 50 MG tablet Take 1 tablet (50 mg total) by mouth every 8 (eight) hours as needed for pain.  60 tablet  2   No current facility-administered medications on file prior to visit.    No Known Allergies  Review of Systems  Review of Systems  Constitutional: Negative for fever and malaise/fatigue.  HENT: Negative for congestion.   Eyes: Negative for discharge.  Respiratory: Negative for shortness of breath.   Cardiovascular: Negative for chest pain, palpitations and leg swelling.  Gastrointestinal: Negative for nausea, abdominal pain and diarrhea.  Genitourinary: Negative for dysuria.  Musculoskeletal: Positive for myalgias and back pain. Negative for falls.  Skin: Negative for rash.  Neurological: Negative for loss of consciousness and headaches.  Endo/Heme/Allergies: Negative for polydipsia.  Psychiatric/Behavioral: Negative for depression and suicidal  ideas. The patient is not nervous/anxious and does not have insomnia.     Objective  BP 148/78  Pulse 88  Temp(Src) 98 F (36.7 C) (Oral)  Ht 5' 9.5" (1.765 m)  Wt 193 lb (87.544 kg)  BMI 28.1 kg/m2  SpO2 97%  Physical Exam  Physical Exam  Constitutional: He is oriented to person, place, and time and well-developed, well-nourished, and in no distress. No distress.  HENT:  Head: Normocephalic and atraumatic.  Eyes: Conjunctivae are normal.  Neck: Neck supple. No thyromegaly present.  Cardiovascular: Normal rate, regular rhythm and normal heart sounds.   No murmur heard. Pulmonary/Chest: Effort normal and breath sounds normal. No respiratory distress.  Abdominal: He exhibits no distension and no mass. There is no tenderness.  Musculoskeletal: He exhibits no edema.  Neurological: He is alert and oriented to person, place, and time.  Skin: Skin is warm.  Psychiatric: Memory, affect and judgment normal.    Lab Results  Component Value Date   TSH 1.37 03/22/2012   Lab Results  Component Value Date   WBC 13.4* 03/22/2012   HGB 14.4 03/22/2012   HCT 43.0 03/22/2012   MCV 92.2 03/22/2012   PLT 338.0 03/22/2012   Lab Results  Component Value Date   CREATININE 0.9 03/22/2012   BUN 18 03/22/2012   NA 140 03/22/2012   K 4.6 03/22/2012   CL 104 03/22/2012   CO2 26 03/22/2012   Lab Results  Component Value Date   ALT 12 06/07/2012   AST 11 06/07/2012   ALKPHOS 97 06/07/2012   BILITOT 0.4 06/07/2012   Lab Results  Component Value Date   CHOL 194 03/22/2012   Lab Results  Component Value Date   HDL 41.90 03/22/2012   Lab Results  Component Value Date   LDLCALC 135* 03/22/2012   Lab Results  Component Value Date   TRIG 85.0 03/22/2012   Lab Results  Component Value Date   CHOLHDL 5 03/22/2012      Assessment & Plan  HYPERTENSION Improved on recheck no changes today with acute pain, reassess next month  Back pain, lumbosacral Recurrent, given Depomedrol today, advised  cannot take Meloxicam or Aleve together or with the steroid. Can alternate Tramadol and Hydrocodone he got in ER, recommend Tramadol in am and Hydrocodone in pm prn

## 2012-07-06 NOTE — Patient Instructions (Addendum)

## 2012-07-06 NOTE — Assessment & Plan Note (Signed)
Recurrent, given Depomedrol today, advised cannot take Meloxicam or Aleve together or with the steroid. Can alternate Tramadol and Hydrocodone he got in ER, recommend Tramadol in am and Hydrocodone in pm prn

## 2012-07-15 ENCOUNTER — Ambulatory Visit (AMBULATORY_SURGERY_CENTER): Payer: Medicare Other | Admitting: *Deleted

## 2012-07-15 VITALS — Ht 69.0 in | Wt 193.2 lb

## 2012-07-15 DIAGNOSIS — Z1211 Encounter for screening for malignant neoplasm of colon: Secondary | ICD-10-CM

## 2012-07-15 MED ORDER — NA SULFATE-K SULFATE-MG SULF 17.5-3.13-1.6 GM/177ML PO SOLN: ORAL | Status: DC

## 2012-07-26 ENCOUNTER — Telehealth: Payer: Self-pay | Admitting: Internal Medicine

## 2012-07-26 ENCOUNTER — Ambulatory Visit (INDEPENDENT_AMBULATORY_CARE_PROVIDER_SITE_OTHER): Payer: Medicare Other | Admitting: Family Medicine

## 2012-07-26 ENCOUNTER — Encounter: Payer: Self-pay | Admitting: Family Medicine

## 2012-07-26 VITALS — BP 149/81 | HR 91 | Temp 98.0°F | Resp 18 | Ht 69.5 in | Wt 188.0 lb

## 2012-07-26 DIAGNOSIS — B029 Zoster without complications: Secondary | ICD-10-CM

## 2012-07-26 NOTE — Progress Notes (Signed)
OFFICE NOTE  07/26/2012  CC:  Chief Complaint  Patient presents with  . Rash    groin / upper leg area x Tuesday or Wednesday     HPI: Patient is a 72 y.o. Caucasian male who is here for rash.   Onset 5-6 days ago on left upper leg (lateral aspect and also medial -groin crease region), itches a little and burns a little.   Prior to onset of rash, his medial thigh felt slightly tingly but he thought nothing of it. He is not sure but thinks he has had shingles once before, in low back region.  Pertinent PMH:  Past Medical History  Diagnosis Date  . Hypertension   . GERD (gastroesophageal reflux disease)   . Colon polyps   . Shingles 05-12-2010  . Back pain, lumbosacral   . Preventative health care 03/21/2011  . Sinusitis acute 03/24/2011  . Memory loss 04/16/2011  . Allergic state 05/30/2011  . Acute bronchitis 07/31/2011  . Leukocytosis 12/15/2011  . Hyperlipidemia 02/15/2012  . Unspecified sinusitis (chronic) 04/05/2012   Past Surgical History  Procedure Laterality Date  . Lumbar laminectomy      twice  . Urethral stricture dilatation      MEDS:  Outpatient Prescriptions Prior to Visit  Medication Sig Dispense Refill  . albuterol (PROVENTIL HFA;VENTOLIN HFA) 108 (90 BASE) MCG/ACT inhaler Inhale 2 puffs into the lungs every 6 (six) hours as needed for wheezing or shortness of breath.  1 Inhaler  5  . amLODipine (NORVASC) 5 MG tablet take 1 tablet by mouth once daily for high blood pressure  30 tablet  6  . aspirin 81 MG tablet Take 81 mg by mouth daily.        Marland Kitchen HYDROcodone-acetaminophen (NORCO/VICODIN) 5-325 MG per tablet Take 1 tablet by mouth every 4 (four) hours as needed for pain.  20 tablet  0  . ranitidine (ZANTAC) 300 MG tablet Take 1 tablet (300 mg total) by mouth at bedtime.  30 tablet  5  . fluticasone (FLONASE) 50 MCG/ACT nasal spray Place 2 sprays into the nose daily.  16 g  6  . gabapentin (NEURONTIN) 100 MG capsule Take 100 mg by mouth 3 (three) times daily.      .  meloxicam (MOBIC) 7.5 MG tablet Take 1 tablet (7.5 mg total) by mouth daily.  20 tablet  0  . Na Sulfate-K Sulfate-Mg Sulf (SUPREP BOWEL PREP) SOLN suprep as directed.  No substitutions  354 mL  0  . PROBIOTIC CAPS Start a generic probiotic daily for the next month      . traMADol (ULTRAM) 50 MG tablet Take 1 tablet (50 mg total) by mouth every 8 (eight) hours as needed for pain.  60 tablet  2  . guaiFENesin (MUCINEX) 600 MG 12 hr tablet Take 1 tablet (600 mg total) by mouth 2 (two) times daily.  20 tablet  0   No facility-administered medications prior to visit.    PE: Blood pressure 149/81, pulse 91, temperature 98 F (36.7 C), temperature source Oral, resp. rate 18, height 5' 9.5" (1.765 m), weight 188 lb (85.276 kg), SpO2 96.00%. Gen: Alert, well appearing.  Patient is oriented to person, place, time, and situation. SKIN: left upper leg with scattered groups of vesicular lesions on an erythematous base--L2 distribution.  None of these appear to have broken or crusted over at this time.  IMPRESSION AND PLAN:  Herpes zoster Mild pain at the most, no meds needed for this. He  has presented too late in the course of this for antiviral med or steroids to be of any help. Discussed monitoring for bacterial skin infection over the next couple weeks as these heal. Signs/symptoms to call or return for were reviewed and pt expressed understanding.   An After Visit Summary was printed and given to the patient.  FOLLOW UP: prn

## 2012-07-26 NOTE — Assessment & Plan Note (Signed)
Mild pain at the most, no meds needed for this. He has presented too late in the course of this for antiviral med or steroids to be of any help. Discussed monitoring for bacterial skin infection over the next couple weeks as these heal. Signs/symptoms to call or return for were reviewed and pt expressed understanding.

## 2012-07-26 NOTE — Patient Instructions (Addendum)
Shingles Shingles (herpes zoster) is an infection that is caused by the same virus that causes chickenpox (varicella). The infection causes a painful skin rash and fluid-filled blisters, which eventually break open, crust over, and heal. It may occur in any area of the body, but it usually affects only one side of the body or face. The pain of shingles usually lasts about 1 month. However, some people with shingles may develop long-term (chronic) pain in the affected area of the body. Shingles often occurs many years after the person had chickenpox. It is more common:  In people older than 50 years.  In people with weakened immune systems, such as those with HIV, AIDS, or cancer.  In people taking medicines that weaken the immune system, such as transplant medicines.  In people under great stress. CAUSES  Shingles is caused by the varicella zoster virus (VZV), which also causes chickenpox. After a person is infected with the virus, it can remain in the person's body for years in an inactive state (dormant). To cause shingles, the virus reactivates and breaks out as an infection in a nerve root. The virus can be spread from person to person (contagious) through contact with open blisters of the shingles rash. It will only spread to people who have not had chickenpox. When these people are exposed to the virus, they may develop chickenpox. They will not develop shingles. Once the blisters scab over, the person is no longer contagious and cannot spread the virus to others. SYMPTOMS  Shingles shows up in stages. The initial symptoms may be pain, itching, and tingling in an area of the skin. This pain is usually described as burning, stabbing, or throbbing.In a few days or weeks, a painful red rash will appear in the area where the pain, itching, and tingling were felt. The rash is usually on one side of the body in a band or belt-like pattern. Then, the rash usually turns into fluid-filled blisters. They  will scab over and dry up in approximately 2 3 weeks. Flu-like symptoms may also occur with the initial symptoms, the rash, or the blisters. These may include:  Fever.  Chills.  Headache.  Upset stomach. DIAGNOSIS  Your caregiver will perform a skin exam to diagnose shingles. Skin scrapings or fluid samples may also be taken from the blisters. This sample will be examined under a microscope or sent to a lab for further testing. TREATMENT  There is no specific cure for shingles. Your caregiver will likely prescribe medicines to help you manage the pain, recover faster, and avoid long-term problems. This may include antiviral drugs, anti-inflammatory drugs, and pain medicines. HOME CARE INSTRUCTIONS   Take a cool bath or apply cool compresses to the area of the rash or blisters as directed. This may help with the pain and itching.   Only take over-the-counter or prescription medicines as directed by your caregiver.   Rest as directed by your caregiver.  Keep your rash and blisters clean with mild soap and cool water or as directed by your caregiver.  Do not pick your blisters or scratch your rash. Apply an anti-itch cream or numbing creams to the affected area as directed by your caregiver.  Keep your shingles rash covered with a loose bandage (dressing).  Avoid skin contact with:  Babies.   Pregnant women.   Children with eczema.   Elderly people with transplants.   People with chronic illnesses, such as leukemia or AIDS.   Wear loose-fitting clothing to help ease   the pain of material rubbing against the rash.  Keep all follow-up appointments with your caregiver.If the area involved is on your face, you may receive a referral for follow-up to a specialist, such as an eye doctor (ophthalmologist) or an ear, nose, and throat (ENT) doctor. Keeping all follow-up appointments will help you avoid eye complications, chronic pain, or disability.  SEEK IMMEDIATE MEDICAL  CARE IF:   You have facial pain, pain around the eye area, or loss of feeling on one side of your face.  You have ear pain or ringing in your ear.  You have loss of taste.  Your pain is not relieved with prescribed medicines.   Your redness or swelling spreads.   You have more pain and swelling.  Your condition is worsening or has changed.   You have a feveror persistent symptoms for more than 2 3 days.  You have a fever and your symptoms suddenly get worse. MAKE SURE YOU:  Understand these instructions.  Will watch your condition.  Will get help right away if you are not doing well or get worse. Document Released: 01/27/2005 Document Revised: 10/22/2011 Document Reviewed: 09/11/2011 [employer] Patient Information 2014 [employer].  

## 2012-07-26 NOTE — Telephone Encounter (Addendum)
PCP provider informed of GI response RE: Colonoscopy; verified pt aware/SLS

## 2012-07-26 NOTE — Telephone Encounter (Signed)
Patient has open draining rash.  Per Jennye Boroughs in endo, needs to be rescheduled.  Risk to patients and staff that have not had chicken pox.  He wants to keep the same time, he is rescheduled to 09/23/12 11:00

## 2012-07-29 ENCOUNTER — Encounter: Payer: Medicare Other | Admitting: Internal Medicine

## 2012-08-02 ENCOUNTER — Ambulatory Visit (INDEPENDENT_AMBULATORY_CARE_PROVIDER_SITE_OTHER): Payer: Medicare Other | Admitting: Family Medicine

## 2012-08-02 ENCOUNTER — Encounter: Payer: Self-pay | Admitting: Family Medicine

## 2012-08-02 VITALS — BP 146/80 | HR 90 | Temp 98.2°F | Ht 69.5 in | Wt 190.1 lb

## 2012-08-02 DIAGNOSIS — I739 Peripheral vascular disease, unspecified: Secondary | ICD-10-CM

## 2012-08-02 DIAGNOSIS — I10 Essential (primary) hypertension: Secondary | ICD-10-CM

## 2012-08-02 DIAGNOSIS — F17200 Nicotine dependence, unspecified, uncomplicated: Secondary | ICD-10-CM

## 2012-08-02 DIAGNOSIS — B029 Zoster without complications: Secondary | ICD-10-CM

## 2012-08-02 LAB — HEPATIC FUNCTION PANEL
ALT: 14 U/L (ref 0–53)
AST: 14 U/L (ref 0–37)
Alkaline Phosphatase: 99 U/L (ref 39–117)
Bilirubin, Direct: 0.1 mg/dL (ref 0.0–0.3)
Total Bilirubin: 0.3 mg/dL (ref 0.3–1.2)

## 2012-08-02 LAB — RENAL FUNCTION PANEL
Albumin: 4.3 g/dL (ref 3.5–5.2)
CO2: 24 mEq/L (ref 19–32)
Chloride: 104 mEq/L (ref 96–112)
Glucose, Bld: 94 mg/dL (ref 70–99)
Sodium: 139 mEq/L (ref 135–145)

## 2012-08-02 LAB — TSH: TSH: 1.332 u[IU]/mL (ref 0.350–4.500)

## 2012-08-02 MED ORDER — ASPIRIN 81 MG PO TABS: 81.0000 mg | ORAL_TABLET | Freq: Two times a day (BID) | ORAL | Status: DC

## 2012-08-02 MED ORDER — ACYCLOVIR 400 MG PO TABS: 400.0000 mg | ORAL_TABLET | Freq: Every day | ORAL | Status: DC

## 2012-08-02 NOTE — Patient Instructions (Signed)
Try Salon Pas cream to leg 2-3 x a day     Shingles Shingles (herpes zoster) is an infection that is caused by the same virus that causes chickenpox (varicella). The infection causes a painful skin rash and fluid-filled blisters, which eventually break open, crust over, and heal. It may occur in any area of the body, but it usually affects only one side of the body or face. The pain of shingles usually lasts about 1 month. However, some people with shingles may develop long-term (chronic) pain in the affected area of the body. Shingles often occurs many years after the person had chickenpox. It is more common:  In people older than 50 years.  In people with weakened immune systems, such as those with HIV, AIDS, or cancer.  In people taking medicines that weaken the immune system, such as transplant medicines.  In people under great stress. CAUSES  Shingles is caused by the varicella zoster virus (VZV), which also causes chickenpox. After a person is infected with the virus, it can remain in the person's body for years in an inactive state (dormant). To cause shingles, the virus reactivates and breaks out as an infection in a nerve root. The virus can be spread from person to person (contagious) through contact with open blisters of the shingles rash. It will only spread to people who have not had chickenpox. When these people are exposed to the virus, they may develop chickenpox. They will not develop shingles. Once the blisters scab over, the person is no longer contagious and cannot spread the virus to others. SYMPTOMS  Shingles shows up in stages. The initial symptoms may be pain, itching, and tingling in an area of the skin. This pain is usually described as burning, stabbing, or throbbing.In a few days or weeks, a painful red rash will appear in the area where the pain, itching, and tingling were felt. The rash is usually on one side of the body in a band or belt-like pattern. Then, the  rash usually turns into fluid-filled blisters. They will scab over and dry up in approximately 2 3 weeks. Flu-like symptoms may also occur with the initial symptoms, the rash, or the blisters. These may include:  Fever.  Chills.  Headache.  Upset stomach. DIAGNOSIS  Your caregiver will perform a skin exam to diagnose shingles. Skin scrapings or fluid samples may also be taken from the blisters. This sample will be examined under a microscope or sent to a lab for further testing. TREATMENT  There is no specific cure for shingles. Your caregiver will likely prescribe medicines to help you manage the pain, recover faster, and avoid long-term problems. This may include antiviral drugs, anti-inflammatory drugs, and pain medicines. HOME CARE INSTRUCTIONS   Take a cool bath or apply cool compresses to the area of the rash or blisters as directed. This may help with the pain and itching.   Only take over-the-counter or prescription medicines as directed by your caregiver.   Rest as directed by your caregiver.  Keep your rash and blisters clean with mild soap and cool water or as directed by your caregiver.  Do not pick your blisters or scratch your rash. Apply an anti-itch cream or numbing creams to the affected area as directed by your caregiver.  Keep your shingles rash covered with a loose bandage (dressing).  Avoid skin contact with:  Babies.   Pregnant women.   Children with eczema.   Elderly people with transplants.   People with chronic  illnesses, such as leukemia or AIDS.   Wear loose-fitting clothing to help ease the pain of material rubbing against the rash.  Keep all follow-up appointments with your caregiver.If the area involved is on your face, you may receive a referral for follow-up to a specialist, such as an eye doctor (ophthalmologist) or an ear, nose, and throat (ENT) doctor. Keeping all follow-up appointments will help you avoid eye complications,  chronic pain, or disability.  SEEK IMMEDIATE MEDICAL CARE IF:   You have facial pain, pain around the eye area, or loss of feeling on one side of your face.  You have ear pain or ringing in your ear.  You have loss of taste.  Your pain is not relieved with prescribed medicines.   Your redness or swelling spreads.   You have more pain and swelling.  Your condition is worsening or has changed.   You have a feveror persistent symptoms for more than 2 3 days.  You have a fever and your symptoms suddenly get worse. MAKE SURE YOU:  Understand these instructions.  Will watch your condition.  Will get help right away if you are not doing well or get worse. Document Released: 01/27/2005 Document Revised: 10/22/2011 Document Reviewed: 09/11/2011 [facility] Patient Information 2014 [place], [place].

## 2012-08-03 LAB — URINALYSIS
Bilirubin Urine: NEGATIVE
Glucose, UA: NEGATIVE mg/dL
Hgb urine dipstick: NEGATIVE
Ketones, ur: NEGATIVE mg/dL
Leukocytes, UA: NEGATIVE
Nitrite: NEGATIVE
Protein, ur: NEGATIVE mg/dL
Specific Gravity, Urine: 1.023 (ref 1.005–1.030)
Urobilinogen, UA: 0.2 mg/dL (ref 0.0–1.0)
pH: 5.5 (ref 5.0–8.0)

## 2012-08-04 LAB — URINE CULTURE
Colony Count: NO GROWTH
Organism ID, Bacteria: NO GROWTH

## 2012-08-05 NOTE — Progress Notes (Signed)
marj informed patient

## 2012-08-07 NOTE — Assessment & Plan Note (Signed)
Encouraged cessation.

## 2012-08-07 NOTE — Assessment & Plan Note (Signed)
Recurrent, Acyclovir 400 mg report worsening symptoms

## 2012-08-07 NOTE — Progress Notes (Signed)
Patient ID: Ian Gonzales, male   DOB: Jun 26, 1940, 72 y.o.   MRN: [id number] Ian Gonzales [id number] [birth year] 08/07/2012      Progress Note-Follow Up  Subjective  Chief Complaint  Chief Complaint  Patient presents with  . Follow-up    2 month    HPI  Patient is a 72 year old Caucasian male who is in today for followup. Unfortunately has suffered another bout with shingles and this was more extensive. This was stretching his groin and radiates down his anterior left thigh. It is slowly improving he still has a burning and stinging sensation. No new vesicles in the last few days. No fevers or chills. No chest pain or palpitations. No worsening shortness or breath GI or GU complaints today. Continues to struggle with fatigue and some anhedonia but denies severe depression. No suicidal ideation  Past Medical History  Diagnosis Date  . Hypertension   . GERD (gastroesophageal reflux disease)   . Colon polyps   . Shingles 05-12-2010  . Back pain, lumbosacral   . Preventative health care 03/21/2011  . Sinusitis acute 03/24/2011  . Memory loss 04/16/2011  . Allergic state 05/30/2011  . Acute bronchitis 07/31/2011  . Leukocytosis 12/15/2011  . Hyperlipidemia 02/15/2012  . Unspecified sinusitis (chronic) 04/05/2012    Past Surgical History  Procedure Laterality Date  . Lumbar laminectomy      twice  . Urethral stricture dilatation      No family history on file.  History   Social History  . Marital Status: Single    Spouse Name: N/A    Number of Children: N/A  . Years of Education: N/A   Occupational History  . Not on file.   Social History Main Topics  . Smoking status: Current Every Day Smoker -- 2.00 packs/day    Types: Cigarettes  . Smokeless tobacco: Never Used  . Alcohol Use: Yes     Comment: once in a while   . Drug Use: No  . Sexually Active: No   Other Topics Concern  . Not on file   Social History Narrative  . No narrative on file    Current  Outpatient Prescriptions on File Prior to Visit  Medication Sig Dispense Refill  . albuterol (PROVENTIL HFA;VENTOLIN HFA) 108 (90 BASE) MCG/ACT inhaler Inhale 2 puffs into the lungs every 6 (six) hours as needed for wheezing or shortness of breath.  1 Inhaler  5  . amLODipine (NORVASC) 5 MG tablet take 1 tablet by mouth once daily for high blood pressure  30 tablet  6  . fluticasone (FLONASE) 50 MCG/ACT nasal spray Place 2 sprays into the nose daily.  16 g  6  . ranitidine (ZANTAC) 300 MG tablet Take 1 tablet (300 mg total) by mouth at bedtime.  30 tablet  5  . traMADol (ULTRAM) 50 MG tablet Take 1 tablet (50 mg total) by mouth every 8 (eight) hours as needed for pain.  60 tablet  2   No current facility-administered medications on file prior to visit.    No Known Allergies  Review of Systems  Review of Systems  Constitutional: Positive for malaise/fatigue. Negative for fever.  HENT: Negative for congestion.   Eyes: Negative for pain and discharge.  Respiratory: Negative for shortness of breath.   Cardiovascular: Negative for chest pain, palpitations and leg swelling.  Gastrointestinal: Negative for nausea, abdominal pain and diarrhea.  Genitourinary: Negative for dysuria.  Musculoskeletal: Positive for joint pain. Negative for falls.  Skin: Positive  for itching and rash.  Neurological: Negative for loss of consciousness and headaches.  Endo/Heme/Allergies: Negative for polydipsia.  Psychiatric/Behavioral: Negative for depression and suicidal ideas. The patient is not nervous/anxious and does not have insomnia.     Objective  BP 146/80  Pulse 90  Temp(Src) 98.2 F (36.8 C) (Oral)  Ht 5' 9.5" (1.765 m)  Wt 190 lb 1.3 oz (86.22 kg)  BMI 27.68 kg/m2  SpO2 92%  Physical Exam  Physical Exam  Constitutional: He is oriented to person, place, and time and well-developed, well-nourished, and in no distress. No distress.  HENT:  Head: Normocephalic and atraumatic.  Eyes:  Conjunctivae are normal.  Neck: Neck supple. No thyromegaly present.  Cardiovascular: Normal rate, regular rhythm and normal heart sounds.   No murmur heard. Pulmonary/Chest: Effort normal and breath sounds normal. No respiratory distress.  Abdominal: He exhibits no distension and no mass. There is no tenderness.  Musculoskeletal: He exhibits no edema.  Neurological: He is alert and oriented to person, place, and time.  Skin: Skin is warm. Rash noted. There is erythema.  Left groin erythemaous rash extending down anterior thigh, superimposed vesicles  Psychiatric: Memory, affect and judgment normal.    Lab Results  Component Value Date   TSH 1.332 08/02/2012   Lab Results  Component Value Date   WBC 13.4* 03/22/2012   HGB 14.4 03/22/2012   HCT 43.0 03/22/2012   MCV 92.2 03/22/2012   PLT 338.0 03/22/2012   Lab Results  Component Value Date   CREATININE 1.00 08/02/2012   BUN 15 08/02/2012   NA 139 08/02/2012   K 4.2 08/02/2012   CL 104 08/02/2012   CO2 24 08/02/2012   Lab Results  Component Value Date   ALT 14 08/02/2012   AST 14 08/02/2012   ALKPHOS 99 08/02/2012   BILITOT 0.3 08/02/2012   Lab Results  Component Value Date   CHOL 194 03/22/2012   Lab Results  Component Value Date   HDL 41.90 03/22/2012   Lab Results  Component Value Date   LDLCALC 135* 03/22/2012   Lab Results  Component Value Date   TRIG 85.0 03/22/2012   Lab Results  Component Value Date   CHOLHDL 5 03/22/2012     Assessment & Plan  Shingles Recurrent, Acyclovir 400 mg report worsening symptoms  HYPERTENSION Adequate control,no changes  TOBACCO USER Encouraged cessation

## 2012-08-07 NOTE — Assessment & Plan Note (Signed)
Adequate control, no changes 

## 2012-08-13 ENCOUNTER — Other Ambulatory Visit: Payer: Self-pay | Admitting: Family Medicine

## 2012-09-01 ENCOUNTER — Encounter: Payer: Self-pay | Admitting: Physician Assistant

## 2012-09-01 ENCOUNTER — Ambulatory Visit (INDEPENDENT_AMBULATORY_CARE_PROVIDER_SITE_OTHER): Payer: Medicare Other | Admitting: Physician Assistant

## 2012-09-01 VITALS — BP 160/82 | HR 97 | Temp 98.2°F | Resp 16 | Wt 190.5 lb

## 2012-09-01 DIAGNOSIS — B0229 Other postherpetic nervous system involvement: Secondary | ICD-10-CM

## 2012-09-01 MED ORDER — GABAPENTIN 100 MG PO CAPS: 100.0000 mg | ORAL_CAPSULE | Freq: Three times a day (TID) | ORAL | Status: DC

## 2012-09-01 NOTE — Assessment & Plan Note (Signed)
Prescription for gabapentin given.  Patient to follow-up at next scheduled appt (4 weeks) with Dr. Abner Greenspan.  Will titrate medication accordingly.  Patient to RTC sooner if symptoms acutely worse or as needed.

## 2012-09-01 NOTE — Patient Instructions (Signed)
Please take Gabapentin as prescribed for burning, tingling pain in left leg and feet.  OTC Salon Pas Spray can be used as needed in addition to medication.  You will need to come back for a follow-up visit in 3-4 weeks.  If symptoms worsen or shingles rash returns, please give the office a call.  Postherpetic Neuralgia Shingles is a painful disease. It is caused by the herpes zoster virus. This is the same virus which also causes chickenpox. It can affect the torso, limbs, or the face. For most people, shingles is a condition of rather sudden onset. Pain usually lasts about 1 month. In older patients, or patients with poor immune systems, a painful, long-standing (chronic) condition called postherpetic neuralgia can develop. This condition rarely happens before age 23. But at least 50% of people over 50 become affected following an attack of shingles. There is a natural tendency for this condition to improve over time with no treatment. Less than 5% of patients have pain that lasts for more than 1 year. DIAGNOSIS  Herpes is usually easily diagnosed on physical exam. Pain sometimes follows when the skin sores (lesions) have disappeared. It is called postherpetic neuralgia. That name simply means the pain that follows herpes. TREATMENT   Treating this condition may be difficult. Usually one of the tricyclic antidepressants, often amitriptyline, is the first line of treatment. There is evidence that the sooner these medications are given, the more likely they are to reduce pain.  Conventional analgesics, regional nerve blocks, and anticonvulsants have little benefit in most cases when used alone. Other tricyclic anti-depressants are used as a second option if the first antidepressant is unsuccessful.  Anticonvulsants, including carbamazepine, have been found to provide some added benefit when used with a tricyclic anti-depressant. This is especially for the stabbing type of pain similar to that of  trigeminal neuralgia.  Chronic opioid therapy. This is a strong narcotic pain medication. It is used to treat pain that is resistant to other measures. The issues of dependency and tolerance can be reduced with closely managed care.  Some cream treatments are applied locally to the affected area. They can help when used with other treatments. Their use may be difficult in the case of postherpetic trigeminal neuralgia. This is involved with the face. So the substances can irritate the eye and the skin around the eye. Examples of creams used include Capsaicin and lidocaine creams.  For shingles, antiviral therapies along with analgesics are recommended. Studies of the effect of anti-viral agents such as acyclovir on shingles have been done. They show improved rates of healing and decreased severity of sudden (acute) pain. Some observations suggest that nerve blocks during shingles infection will:  Reduce pain.  Shorten the acute episode.  Prevent the emergence of postherpetic neuralgia. Viral medications used include Acyclovir (Zovirax), Valacyclovir, Famciclovir and a lysine diet. Document Released: 04/19/2002 Document Revised: 04/21/2011 Document Reviewed: 01/27/2005 [facility] Patient Information 2014 [place], [place].

## 2012-09-01 NOTE — Progress Notes (Signed)
Patient ID: Ian Gonzales, male   DOB: [birth year], 72 y.o.   MRN: [id number]  Patient presents to clinic today complaining that his "feet are on fire"  Patient states he has been experiencing this over the past month since his first outbreak of shingles.  Patient was seen by Marvel Plan, MD in June and diagnosed with shingles. Patient had a recurrent episode of shingles rash a week or so later.  Was given prescription for Acyclovir from Abner Greenspan, MD, but patient did not get prescription filled.  Since that time the rash has cleared, leaving him with only a few scars from scratching, and constant burning pain where the rash was.  He also notes the same burning, painful symptoms in his L foot. Patient tried OTC tylenol and aspercreme with no relief of symptoms. Patient denies PMH of diabetes or nutritional deficiencies.  Denies history of hypothyroidism or renal disease.  Denies recent fever, chills or change in weight.  Past Medical History  Diagnosis Date  . Hypertension   . GERD (gastroesophageal reflux disease)   . Colon polyps   . Shingles 05-12-2010  . Back pain, lumbosacral   . Preventative health care 03/21/2011  . Sinusitis acute 03/24/2011  . Memory loss 04/16/2011  . Allergic state 05/30/2011  . Acute bronchitis 07/31/2011  . Leukocytosis 12/15/2011  . Hyperlipidemia 02/15/2012  . Unspecified sinusitis (chronic) 04/05/2012   Current Outpatient Prescriptions on File Prior to Visit  Medication Sig Dispense Refill  . albuterol (PROVENTIL HFA;VENTOLIN HFA) 108 (90 BASE) MCG/ACT inhaler Inhale 2 puffs into the lungs every 6 (six) hours as needed for wheezing or shortness of breath.  1 Inhaler  5  . amLODipine (NORVASC) 5 MG tablet take 1 tablet by mouth once daily for high blood pressure  30 tablet  6  . aspirin 81 MG tablet Take 1 tablet (81 mg total) by mouth 2 (two) times daily.  30 tablet    . fluticasone (FLONASE) 50 MCG/ACT nasal spray Place 2 sprays into the nose daily.  16 g  6  . ranitidine  (ZANTAC) 300 MG tablet take 1 tablet by mouth at bedtime  30 tablet  5  . traMADol (ULTRAM) 50 MG tablet Take 1 tablet (50 mg total) by mouth every 8 (eight) hours as needed for pain.  60 tablet  2  . acyclovir (ZOVIRAX) 400 MG tablet Take 1 tablet (400 mg total) by mouth 5 (five) times daily. For shingles/rash  25 tablet  1   No current facility-administered medications on file prior to visit.   No Known Allergies  Review of Systems  Constitutional: Positive for malaise/fatigue. Negative for fever, chills and weight loss.  Skin: Positive for itching and rash.  Neurological: Positive for tingling and sensory change.  Psychiatric/Behavioral: Negative for depression.   Physical Exam  Constitutional: He is oriented to person, place, and time and well-developed, well-nourished, and in no distress. No distress.  HENT:  Head: Normocephalic and atraumatic.  Eyes: Conjunctivae are normal. Pupils are equal, round, and reactive to light.  Neck: Normal range of motion. Neck supple.  Cardiovascular: Normal rate, regular rhythm and normal heart sounds.   Pulmonary/Chest: Effort normal and breath sounds normal. No respiratory distress.  Lymphadenopathy:    He has no cervical adenopathy.  Neurological: He is alert and oriented to person, place, and time.  Skin: Skin is warm and dry. He is not diaphoretic.  No presence of vesicular rash.  However, there are several areas of L posterior and lateral  thigh that show scarring.    Assessment/Plan: (1) Postherpetic Neuralgia -- Prescription for gabapentin given.  Patient to follow-up at next scheduled appt (4 weeks) with Dr. Abner Greenspan.  Will titrate medication accordingly.  Patient to RTC sooner if symptoms acutely worse or as needed.

## 2012-09-03 ENCOUNTER — Other Ambulatory Visit (INDEPENDENT_AMBULATORY_CARE_PROVIDER_SITE_OTHER): Payer: Medicare Other | Admitting: Physician Assistant

## 2012-09-03 DIAGNOSIS — B0229 Other postherpetic nervous system involvement: Secondary | ICD-10-CM

## 2012-09-03 MED ORDER — GABAPENTIN 300 MG PO CAPS: ORAL_CAPSULE | ORAL | Status: DC

## 2012-09-03 MED ORDER — GABAPENTIN 100 MG PO CAPS: ORAL_CAPSULE | ORAL | Status: DC

## 2012-09-15 ENCOUNTER — Other Ambulatory Visit: Payer: Self-pay | Admitting: Family Medicine

## 2012-09-17 ENCOUNTER — Ambulatory Visit (INDEPENDENT_AMBULATORY_CARE_PROVIDER_SITE_OTHER): Payer: Medicare Other | Admitting: Physician Assistant

## 2012-09-17 ENCOUNTER — Encounter: Payer: Self-pay | Admitting: Physician Assistant

## 2012-09-17 VITALS — BP 164/78 | HR 89 | Temp 97.8°F | Resp 18 | Wt 190.0 lb

## 2012-09-17 DIAGNOSIS — G609 Hereditary and idiopathic neuropathy, unspecified: Secondary | ICD-10-CM

## 2012-09-17 DIAGNOSIS — I10 Essential (primary) hypertension: Secondary | ICD-10-CM

## 2012-09-17 DIAGNOSIS — Z131 Encounter for screening for diabetes mellitus: Secondary | ICD-10-CM

## 2012-09-17 DIAGNOSIS — R358 Other polyuria: Secondary | ICD-10-CM

## 2012-09-17 DIAGNOSIS — B0229 Other postherpetic nervous system involvement: Secondary | ICD-10-CM

## 2012-09-17 DIAGNOSIS — G629 Polyneuropathy, unspecified: Secondary | ICD-10-CM

## 2012-09-17 DIAGNOSIS — R413 Other amnesia: Secondary | ICD-10-CM

## 2012-09-17 MED ORDER — LIDOCAINE 5 % EX PTCH: 1.0000 | MEDICATED_PATCH | CUTANEOUS | Status: DC

## 2012-09-17 MED ORDER — AMITRIPTYLINE HCL 25 MG PO TABS: ORAL_TABLET | ORAL | Status: DC

## 2012-09-17 NOTE — Assessment & Plan Note (Signed)
DC Gabapentin per patients request.  Start trial of Amitryptyline 25mg  qd. Will titrate dose up. Rx for lidocaine patches given.  Obtain BMP and glucose to rule/out DM.  Patient to follow-up in 2 weeks with Dr. Abner Greenspan

## 2012-09-17 NOTE — Patient Instructions (Signed)
Please obtain labs.  I will call you with results.  Please stop gabapentin and start new medications.  Take as prescribed.  Follow-up in 2 weeks or sooner if needed.

## 2012-09-17 NOTE — Progress Notes (Signed)
Patient ID: Ian Gonzales, male   DOB: 1940/12/23, 72 y.o.   MRN: [id number]   Patient with PMH of shingles and postherpetic neuralgia presents requesting medication change.  Patient states burning sensation has improved but tingling in leg has persisted.  States medication makes him feel out of the loop and he does not want to continue taking it. Patient has not tried salon pas patched to areas as previously recommended.    Patient also endorses polyuria, polydipsia, but denies hx of DM.  Patient otherwise doing well.  Past Medical History  Diagnosis Date  . Hypertension   . GERD (gastroesophageal reflux disease)   . Colon polyps   . Shingles 05-12-2010  . Back pain, lumbosacral   . Preventative health care 03/21/2011  . Sinusitis acute 03/24/2011  . Memory loss 04/16/2011  . Allergic state 05/30/2011  . Acute bronchitis 07/31/2011  . Leukocytosis 12/15/2011  . Hyperlipidemia 02/15/2012  . Unspecified sinusitis (chronic) 04/05/2012   Current Outpatient Prescriptions on File Prior to Visit  Medication Sig Dispense Refill  . albuterol (PROVENTIL HFA;VENTOLIN HFA) 108 (90 BASE) MCG/ACT inhaler Inhale 2 puffs into the lungs every 6 (six) hours as needed for wheezing or shortness of breath.  1 Inhaler  5  . amLODipine (NORVASC) 5 MG tablet take 1 tablet by mouth once daily for high blood pressure  30 tablet  6  . aspirin 81 MG tablet Take 1 tablet (81 mg total) by mouth 2 (two) times daily.  30 tablet    . fluticasone (FLONASE) 50 MCG/ACT nasal spray Place 2 sprays into the nose daily.  16 g  6  . ranitidine (ZANTAC) 300 MG tablet take 1 tablet by mouth at bedtime  30 tablet  5  . traMADol (ULTRAM) 50 MG tablet take 1 tablet by mouth every 8 hours if needed for pain  60 tablet  2  . acyclovir (ZOVIRAX) 400 MG tablet Take 1 tablet (400 mg total) by mouth 5 (five) times daily. For shingles/rash  25 tablet  1   No current facility-administered medications on file prior to visit.   No Known  Allergies No family history on file.  History   Social History  . Marital Status: Single    Spouse Name: N/A    Number of Children: N/A  . Years of Education: N/A   Social History Main Topics  . Smoking status: Current Every Day Smoker -- 2.00 packs/day    Types: Cigarettes  . Smokeless tobacco: Never Used  . Alcohol Use: Yes     Comment: once in a while   . Drug Use: No  . Sexually Active: No   Other Topics Concern  . None   Social History Narrative  . None   Review of Systems  Genitourinary: Negative for dysuria, urgency, hematuria and flank pain.       Endorses polyuria  Skin: Negative for itching and rash.  Neurological: Positive for tingling. Negative for dizziness and loss of consciousness.  Ceasar Mons Vitals:   09/17/12 1109  BP: 164/78  Pulse: 89  Temp: 97.8 F (36.6 C)  Resp: 18   Physical Exam  Constitutional: He is oriented to person, place, and time and well-developed, well-nourished, and in no distress.  HENT:  Head: Normocephalic and atraumatic.  Eyes: Conjunctivae are normal. Pupils are equal, round, and reactive to light.  Neurological: He is alert and oriented to person, place, and time. No cranial nerve deficit.  Skin: Skin is warm and dry. No  rash noted.   Postherpetic neuralgia DC Gabapentin per patients request.  Start trial of Amitryptyline 25mg  qd. Will titrate dose up. Rx for lidocaine patches given.  Obtain BMP and glucose to rule/out DM.  Patient to follow-up in 2 weeks with Dr. Abner Greenspan

## 2012-09-18 LAB — BASIC METABOLIC PANEL
BUN: 14 mg/dL (ref 6–23)
Chloride: 105 mEq/L (ref 96–112)
Creat: 0.79 mg/dL (ref 0.50–1.35)
Glucose, Bld: 97 mg/dL (ref 70–99)

## 2012-09-23 ENCOUNTER — Ambulatory Visit (AMBULATORY_SURGERY_CENTER): Payer: Medicare Other | Admitting: Internal Medicine

## 2012-09-23 ENCOUNTER — Encounter: Payer: Self-pay | Admitting: Internal Medicine

## 2012-09-23 VITALS — BP 162/95 | HR 74 | Temp 97.2°F | Resp 17 | Ht 69.5 in | Wt 190.0 lb

## 2012-09-23 DIAGNOSIS — D126 Benign neoplasm of colon, unspecified: Secondary | ICD-10-CM

## 2012-09-23 DIAGNOSIS — Z860100 Personal history of colon polyps, unspecified: Secondary | ICD-10-CM | POA: Insufficient documentation

## 2012-09-23 DIAGNOSIS — K648 Other hemorrhoids: Secondary | ICD-10-CM

## 2012-09-23 MED ORDER — SODIUM CHLORIDE 0.9 % IV SOLN: 500.0000 mL | INTRAVENOUS | Status: DC

## 2012-09-23 NOTE — Op Note (Signed)
[facility] [address] [place], [address]   COLONOSCOPY PROCEDURE REPORT  PATIENT: Ian Gonzales, Ian Gonzales  MR#: [id number] BIRTHDATE: 03/07/1940 , 72  yrs. old GENDER: Male ENDOSCOPIST: Iva Boop, MD, [facility] PROCEDURE DATE:  09/23/2012 PROCEDURE:   Colonoscopy with biopsy and snare polypectomy First Screening Colonoscopy - Avg.  risk and is 50 yrs.  old or older - No.  Prior Negative Screening - Now for repeat screening. N/A  History of Adenoma - Now for follow-up colonoscopy & has been > or = to 3 yrs.  Yes hx of adenoma.  Has been 3 or more years since last colonoscopy.  Polyps Removed Today? Yes. ASA CLASS:   Class III INDICATIONS:Patient's personal history of adenomatous colon polyps.  MEDICATIONS: propofol (Diprivan) 200mg  IV, MAC sedation, administered by CRNA, and These medications were titrated to patient response per physician's verbal order  DESCRIPTION OF PROCEDURE:   After the risks benefits and alternatives of the procedure were thoroughly explained, informed consent was obtained.  A digital rectal exam revealed no rectal mass, A digital rectal exam revealed several skin tags, A digital rectal exam revealed no prostatic nodules, and A digital rectal exam revealed the prostate was not enlarged.   The LB [id number]  endoscope was introduced through the anus and advanced to the cecum, which was identified by both the appendix and ileocecal valve. No adverse events experienced.   The quality of the prep was excellent using Suprep  The instrument was then slowly withdrawn as the colon was fully examined.   COLON FINDINGS: Three polyps measuring 4, 2, and 5 mm in size were found at the cecum, in the ascending colon, and sigmoid colon.  A polypectomy was performed with cold forceps (ascending 2mm) and with a cold snare.  The resection was complete and the polyp tissue was completely retrieved.   The colon mucosa was otherwise  normal. Retroflexed views revealed internal hemorrhoids. The time to cecum=2 minutes 24 seconds.  Withdrawal time=13 minutes 54 seconds. The scope was withdrawn and the procedure completed. COMPLICATIONS: There were no complications.  ENDOSCOPIC IMPRESSION: 1.   Three polyps measuring 4, 2, and 5 mm in size were found at the cecum, in the ascending colon, and sigmoid colon; polypectomy was performed with cold forceps and with a cold snare 2.   The colon mucosa was otherwise normal 3.   Internal hemorrhoids  RECOMMENDATIONS: 1.  Timing of repeat colonoscopy will be determined by pathology findings. 2.   Likely 5 years eSigned:  Iva Boop, MD, [facility] 09/23/2012 12:28 PM  cc: The Patient

## 2012-09-23 NOTE — Progress Notes (Signed)
A/ox3 pleased with MAC, report to Penny RN 

## 2012-09-23 NOTE — Progress Notes (Signed)
Called to room to assist during endoscopic procedure.  Patient ID and intended procedure confirmed with present staff. Received instructions for my participation in the procedure from the performing physician.  

## 2012-09-23 NOTE — Patient Instructions (Addendum)
I found and removed 3 very small polyps that look benign.  You also have hemorrhoids. If you have hemorrhoid problems (swelling, itching, bleeding) I am able to treat those with an in-office procedure. If you like, please call my office at [phone] to schedule an appointment and I can evaluate you further.  I will let you know pathology results and when to have another routine colonoscopy by mail. I appreciate the opportunity to care for you. Iva Boop, MD, FACG   YOU HAD AN ENDOSCOPIC PROCEDURE TODAY AT THE [facility]: Refer to the procedure report that was given to you for any specific questions about what was found during the examination.  If the procedure report does not answer your questions, please call your gastroenterologist to clarify.  If you requested that your care partner not be given the details of your procedure findings, then the procedure report has been included in a sealed envelope for you to review at your convenience later.  YOU SHOULD EXPECT: Some feelings of bloating in the abdomen. Passage of more gas than usual.  Walking can help get rid of the air that was put into your GI tract during the procedure and reduce the bloating. If you had a lower endoscopy (such as a colonoscopy or flexible sigmoidoscopy) you may notice spotting of blood in your stool or on the toilet paper. If you underwent a bowel prep for your procedure, then you may not have a normal bowel movement for a few days.  DIET: Your first meal following the procedure should be a light meal and then it is ok to progress to your normal diet.  A half-sandwich or bowl of soup is an example of a good first meal.  Heavy or fried foods are harder to digest and may make you feel nauseous or bloated.  Likewise meals heavy in dairy and vegetables can cause extra gas to form and this can also increase the bloating.  Drink plenty of fluids but you should avoid alcoholic beverages for 24 hours.  ACTIVITY:  Your care partner should take you home directly after the procedure.  You should plan to take it easy, moving slowly for the rest of the day.  You can resume normal activity the day after the procedure however you should NOT DRIVE or use heavy machinery for 24 hours (because of the sedation medicines used during the test).    SYMPTOMS TO REPORT IMMEDIATELY: A gastroenterologist can be reached at any hour.  During normal business hours, 8:30 AM to 5:00 PM Monday through Friday, call [phone].  After hours and on weekends, please call the GI answering service at [phone] who will take a message and have the physician on call contact you.   Following lower endoscopy (colonoscopy or flexible sigmoidoscopy):  Excessive amounts of blood in the stool  Significant tenderness or worsening of abdominal pains  Swelling of the abdomen that is new, acute  Fever of 100F or higher    FOLLOW UP: If any biopsies were taken you will be contacted by phone or by letter within the next 1-3 weeks.  Call your gastroenterologist if you have not heard about the biopsies in 3 weeks.  Our staff will call the home number listed on your records the next business day following your procedure to check on you and address any questions or concerns that you may have at that time regarding the information given to you following your procedure. This is a courtesy call and  so if there is no answer at the home number and we have not heard from you through the emergency physician on call, we will assume that you have returned to your regular daily activities without incident.  SIGNATURES/CONFIDENTIALITY: You and/or your care partner have signed paperwork which will be entered into your electronic medical record.  These signatures attest to the fact that that the information above on your After Visit Summary has been reviewed and is understood.  Full responsibility of the confidentiality of this discharge information lies  with you and/or your care-partner.   Information on polyps & hemorrhoids given to you today

## 2012-09-23 NOTE — Progress Notes (Signed)
Patient did not experience any of the following events: a burn prior to discharge; a fall within the facility; wrong site/side/patient/procedure/implant event; or a hospital transfer or hospital admission upon discharge from the facility. (G8907) Patient did not have preoperative order for IV antibiotic SSI prophylaxis. (G8918)  

## 2012-09-24 ENCOUNTER — Telehealth: Payer: Self-pay

## 2012-09-24 NOTE — Telephone Encounter (Signed)
  Follow up Call-  Call back number 09/23/2012 09/23/2012  Post procedure Call Back phone  # [phone]  cell [phone]  Permission to leave phone message - No     Patient questions:  Do you have a fever, pain , or abdominal swelling? no Pain Score  0 *  Have you tolerated food without any problems? yes  Have you been able to return to your normal activities? yes  Do you have any questions about your discharge instructions: Diet   no Medications  no Follow up visit  no  Do you have questions or concerns about your Care? no  Actions: * If pain score is 4 or above: No action needed, pain <4.

## 2012-09-29 ENCOUNTER — Encounter: Payer: Self-pay | Admitting: Internal Medicine

## 2012-09-29 NOTE — Progress Notes (Signed)
Quick Note:  2 diminutive adenomas and one hyperplastic Consider repeat colon 2019 (at age 72) ______

## 2012-10-04 ENCOUNTER — Ambulatory Visit (INDEPENDENT_AMBULATORY_CARE_PROVIDER_SITE_OTHER): Payer: Medicare Other | Admitting: Family Medicine

## 2012-10-04 ENCOUNTER — Encounter: Payer: Self-pay | Admitting: Family Medicine

## 2012-10-04 VITALS — BP 142/80 | HR 94 | Temp 98.4°F | Ht 69.5 in | Wt 190.0 lb

## 2012-10-04 DIAGNOSIS — M7710 Lateral epicondylitis, unspecified elbow: Secondary | ICD-10-CM

## 2012-10-04 DIAGNOSIS — J449 Chronic obstructive pulmonary disease, unspecified: Secondary | ICD-10-CM

## 2012-10-04 DIAGNOSIS — M7020 Olecranon bursitis, unspecified elbow: Secondary | ICD-10-CM

## 2012-10-04 DIAGNOSIS — M7022 Olecranon bursitis, left elbow: Secondary | ICD-10-CM

## 2012-10-04 DIAGNOSIS — M5430 Sciatica, unspecified side: Secondary | ICD-10-CM

## 2012-10-04 DIAGNOSIS — I70209 Unspecified atherosclerosis of native arteries of extremities, unspecified extremity: Secondary | ICD-10-CM

## 2012-10-04 DIAGNOSIS — J4489 Other specified chronic obstructive pulmonary disease: Secondary | ICD-10-CM

## 2012-10-04 DIAGNOSIS — IMO0002 Reserved for concepts with insufficient information to code with codable children: Secondary | ICD-10-CM

## 2012-10-04 DIAGNOSIS — M5432 Sciatica, left side: Secondary | ICD-10-CM

## 2012-10-04 DIAGNOSIS — I10 Essential (primary) hypertension: Secondary | ICD-10-CM

## 2012-10-04 DIAGNOSIS — M549 Dorsalgia, unspecified: Secondary | ICD-10-CM

## 2012-10-04 MED ORDER — ASPIRIN EC 325 MG PO TBEC: 325.0000 mg | DELAYED_RELEASE_TABLET | Freq: Every day | ORAL | Status: DC

## 2012-10-04 MED ORDER — METHYLPREDNISOLONE ACETATE 40 MG/ML IJ SUSP
40.0000 mg | Freq: Once | INTRAMUSCULAR | Status: AC
  Administered 2012-10-04: 40 mg via INTRAMUSCULAR

## 2012-10-10 ENCOUNTER — Encounter: Payer: Self-pay | Admitting: Family Medicine

## 2012-10-10 DIAGNOSIS — IMO0002 Reserved for concepts with insufficient information to code with codable children: Secondary | ICD-10-CM

## 2012-10-10 HISTORY — DX: Reserved for concepts with insufficient information to code with codable children: IMO0002

## 2012-10-10 NOTE — Assessment & Plan Note (Signed)
Well controlled, no changes 

## 2012-10-10 NOTE — Assessment & Plan Note (Signed)
Swelling unchanged for several weeks now, continue ice and rest and referred to ortho for further consideration due to discomfort.

## 2012-10-10 NOTE — Assessment & Plan Note (Signed)
Unfortunately continues to smoke, no recent exacerbation

## 2012-10-10 NOTE — Assessment & Plan Note (Addendum)
Did not tolerate Amitriptyline. Will continue Neurontin for now. Encouraged activity as tolerated

## 2012-10-10 NOTE — Progress Notes (Signed)
Patient ID: Ian Gonzales, male   DOB: Sep 28, 1940, 72 y.o.   MRN: [id number] OMAR GAYDEN [id number] 12-27-1940 10/10/2012      Progress Note-Follow Up  Subjective  Chief Complaint  Chief Complaint  Patient presents with  . Follow-up    1 month  . left elbow    swollen and red X 1.5 -2 months    HPI  Patient is a 72 year old Caucasian male who is in today for followup. He has persistent swelling and discomfort in his left elbow over several weeks. It is not changing. No warmth or redness. No fevers or chills. He tried amitriptyline for his peripheral neuropathy and sciatica because mental status changes so he stopped it. Says he felt weak and didn't think straight. No chest pain or palpitation. No shortness of breath GI or GU complaints. Continues to have chronic low back and left lower extremity pain which is worse with ambulation  Past Medical History  Diagnosis Date  . Hypertension   . GERD (gastroesophageal reflux disease)   . Colon polyps   . Shingles 05-12-2010  . Back pain, lumbosacral   . Preventative health care 03/21/2011  . Sinusitis acute 03/24/2011  . Memory loss 04/16/2011  . Allergic state 05/30/2011  . Acute bronchitis 07/31/2011  . Leukocytosis 12/15/2011  . Hyperlipidemia 02/15/2012  . Unspecified sinusitis (chronic) 04/05/2012  . Epicondylitis 10/10/2012    Past Surgical History  Procedure Laterality Date  . Lumbar laminectomy      twice  . Urethral stricture dilatation    . Colonoscopy      History reviewed. No pertinent family history.  History   Social History  . Marital Status: Single    Spouse Name: N/A    Number of Children: N/A  . Years of Education: N/A   Occupational History  . Not on file.   Social History Main Topics  . Smoking status: Current Every Day Smoker -- 2.00 packs/day    Types: Cigarettes  . Smokeless tobacco: Never Used  . Alcohol Use: Yes     Comment: once in a while   . Drug Use: No  . Sexual Activity: No   Other  Topics Concern  . Not on file   Social History Narrative  . No narrative on file    Current Outpatient Prescriptions on File Prior to Visit  Medication Sig Dispense Refill  . albuterol (PROVENTIL HFA;VENTOLIN HFA) 108 (90 BASE) MCG/ACT inhaler Inhale 2 puffs into the lungs every 6 (six) hours as needed for wheezing or shortness of breath.  1 Inhaler  5  . amLODipine (NORVASC) 5 MG tablet take 1 tablet by mouth once daily for high blood pressure  30 tablet  6  . fluticasone (FLONASE) 50 MCG/ACT nasal spray Place 2 sprays into the nose daily.  16 g  6  . gabapentin (NEURONTIN) 300 MG capsule       . ranitidine (ZANTAC) 300 MG tablet take 1 tablet by mouth at bedtime  30 tablet  5  . traMADol (ULTRAM) 50 MG tablet take 1 tablet by mouth every 8 hours if needed for pain  60 tablet  2   No current facility-administered medications on file prior to visit.    No Known Allergies  Review of Systems  Review of Systems  Constitutional: Positive for malaise/fatigue. Negative for fever.  HENT: Negative for congestion.   Eyes: Negative for discharge.  Respiratory: Negative for shortness of breath.   Cardiovascular: Negative for chest pain, palpitations and  leg swelling.  Gastrointestinal: Negative for nausea, abdominal pain and diarrhea.  Genitourinary: Negative for dysuria.  Musculoskeletal: Positive for myalgias, back pain and joint pain. Negative for falls.  Skin: Negative for rash.  Neurological: Negative for loss of consciousness and headaches.  Endo/Heme/Allergies: Negative for polydipsia.  Psychiatric/Behavioral: Negative for depression and suicidal ideas. The patient is not nervous/anxious and does not have insomnia.     Objective  BP 142/80  Pulse 94  Temp(Src) 98.4 F (36.9 C) (Oral)  Ht 5' 9.5" (1.765 m)  Wt 190 lb (86.183 kg)  BMI 27.67 kg/m2  SpO2 96%  Physical Exam  Physical Exam  Constitutional: He is oriented to person, place, and time and well-developed,  well-nourished, and in no distress. No distress.  HENT:  Head: Normocephalic and atraumatic.  Eyes: Conjunctivae are normal.  Neck: Neck supple. No thyromegaly present.  Cardiovascular: Normal rate, regular rhythm and normal heart sounds.   No murmur heard. Pulmonary/Chest: Effort normal and breath sounds normal. No respiratory distress.  Abdominal: He exhibits no distension and no mass. There is no tenderness.  Musculoskeletal: He exhibits edema and tenderness.  Left elbow swollen and mildly tender with palpation. No fluctuance.   Neurological: He is alert and oriented to person, place, and time.  Skin: Skin is warm.  Psychiatric: Memory, affect and judgment normal.    Lab Results  Component Value Date   TSH 1.332 08/02/2012   Lab Results  Component Value Date   WBC 13.4* 03/22/2012   HGB 14.4 03/22/2012   HCT 43.0 03/22/2012   MCV 92.2 03/22/2012   PLT 338.0 03/22/2012   Lab Results  Component Value Date   CREATININE 0.79 09/17/2012   BUN 14 09/17/2012   NA 141 09/17/2012   K 4.7 09/17/2012   CL 105 09/17/2012   CO2 27 09/17/2012   Lab Results  Component Value Date   ALT 14 08/02/2012   AST 14 08/02/2012   ALKPHOS 99 08/02/2012   BILITOT 0.3 08/02/2012   Lab Results  Component Value Date   CHOL 194 03/22/2012   Lab Results  Component Value Date   HDL 41.90 03/22/2012   Lab Results  Component Value Date   LDLCALC 135* 03/22/2012   Lab Results  Component Value Date   TRIG 85.0 03/22/2012   Lab Results  Component Value Date   CHOLHDL 5 03/22/2012     Assessment & Plan  HYPERTENSION Well controlled, no changes.  COPD Unfortunately continues to smoke, no recent exacerbation  Epicondylitis Swelling unchanged for several weeks now, continue ice and rest and referred to ortho for further consideration due to discomfort.  SCIATICA Did not tolerate Amitriptyline. Will continue Neurontin for now. Encouraged activity as tolerated

## 2012-10-21 ENCOUNTER — Other Ambulatory Visit: Payer: Self-pay | Admitting: Orthopedic Surgery

## 2012-10-21 DIAGNOSIS — M48061 Spinal stenosis, lumbar region without neurogenic claudication: Secondary | ICD-10-CM

## 2012-10-28 ENCOUNTER — Other Ambulatory Visit: Payer: Medicare Other

## 2012-11-01 ENCOUNTER — Ambulatory Visit
Admission: RE | Admit: 2012-11-01 | Discharge: 2012-11-01 | Disposition: A | Discharge status: Home / Self Care | Payer: Medicare Other | Source: Ambulatory Visit | Attending: Orthopedic Surgery | Admitting: Orthopedic Surgery

## 2012-11-01 VITALS — BP 132/59 | HR 79

## 2012-11-01 DIAGNOSIS — M48061 Spinal stenosis, lumbar region without neurogenic claudication: Secondary | ICD-10-CM

## 2012-11-01 DIAGNOSIS — M545 Low back pain: Secondary | ICD-10-CM

## 2012-11-01 MED ORDER — IOHEXOL 180 MG/ML  SOLN
15.0000 mL | Freq: Once | INTRAMUSCULAR | Status: AC | PRN
  Administered 2012-11-01: 15 mL via INTRATHECAL

## 2012-11-01 MED ORDER — DIAZEPAM 5 MG PO TABS: 5.0000 mg | ORAL_TABLET | Freq: Once | ORAL | Status: DC

## 2012-11-21 NOTE — H&P (Signed)
Ian Gonzales is an 72 y.o. male.   Chief Complaint: back pain HPI: [name] presented with the chief complaint of back pain x 4 months without any injury. Symptoms reported include: pain, pain with weightbearing, weakness, numbness, burning, leg pain and foot pain. The patient states that they are doing poorly. Current treatment includes: NSAIDs and pain medications. The following medication has been used for pain control: antiinflammatory medication, Tylenol and Ultram. The patient reports their current pain level to be moderate to severe.  He went to see his PCP for back pain and was sent for xrays. He said he has scoliosis and arthritis. He states he then had shingles in the left groin a couple months ago. He states that he was having more left leg pain but now has bilateral leg pain and numbness and tingling in both feet. He has had back surgery in the 70's for a herniated disc. CT myelogram of the lumbar spine showed a near complete block at L3-L4, partial at L4-L5, partial at L2-L3.  Past Medical History  Diagnosis Date  . Hypertension   . GERD (gastroesophageal reflux disease)   . Colon polyps   . Shingles 05-12-2010  . Back pain, lumbosacral   . Preventative health care 03/21/2011  . Sinusitis acute 03/24/2011  . Memory loss 04/16/2011  . Allergic state 05/30/2011  . Acute bronchitis 07/31/2011  . Leukocytosis 12/15/2011  . Hyperlipidemia 02/15/2012  . Unspecified sinusitis (chronic) 04/05/2012  . Epicondylitis 10/10/2012    Past Surgical History  Procedure Laterality Date  . Lumbar laminectomy      twice  . Urethral stricture dilatation    . Colonoscopy     Social History:  reports that he has been smoking Cigarettes.  He has been smoking about 2.00 packs per day. He has never used smokeless tobacco. He reports that he drinks alcohol. He reports that he does not use illicit drugs.  Allergies: No Known Allergies   Current outpatient prescriptions: albuterol (PROVENTIL HFA;VENTOLIN  HFA) 108 (90 BASE) MCG/ACT inhaler, Inhale 2 puffs into the lungs every 6 (six) hours as needed for wheezing or shortness of breath., Disp: 1 Inhaler, Rfl: 5;   amLODipine (NORVASC) 5 MG tablet, take 1 tablet by mouth once daily for high blood pressure, Disp: 30 tablet, Rfl: 6;   aspirin EC 325 MG tablet, Take 1 tablet (325 mg total) by mouth daily., Disp: 30 tablet, Rfl: 0 fluticasone (FLONASE) 50 MCG/ACT nasal spray, Place 2 sprays into the nose daily., Disp: 16 g, Rfl: 6;   gabapentin (NEURONTIN) 300 MG capsule, , Disp: , Rfl: ;   ranitidine (ZANTAC) 300 MG tablet, take 1 tablet by mouth at bedtime, Disp: 30 tablet, Rfl: 5;   traMADol (ULTRAM) 50 MG tablet, take 1 tablet by mouth every 8 hours if needed for pain, Disp: 60 tablet, Rfl: 2  Vitals Weight: 187 lb Height: 69.5 in Body Surface Area: 2.04 m Body Mass Index: 27.22 kg/m Pulse: 84 (Regular) BP: 157/89 (Sitting, Left Arm, Standard)  Review of Systems  Constitutional: Positive for malaise/fatigue. Negative for fever, chills, weight loss and diaphoresis.  HENT: Negative for congestion, ear discharge, ear pain, hearing loss, nosebleeds, sore throat and tinnitus.   Eyes: Positive for photophobia. Negative for blurred vision, double vision, pain, discharge and redness.  Respiratory: Positive for shortness of breath. Negative for cough, hemoptysis, sputum production, wheezing and stridor.   Cardiovascular: Negative.   Gastrointestinal: Negative.   Genitourinary: Positive for frequency. Negative for dysuria, urgency, hematuria and  flank pain.  Musculoskeletal: Positive for back pain and joint pain. Negative for falls, myalgias and neck pain.  Skin: Negative.   Neurological: Positive for dizziness and headaches. Negative for tingling, tremors, sensory change, speech change, focal weakness, seizures, loss of consciousness and weakness.  Endo/Heme/Allergies: Negative for environmental allergies and polydipsia. Bruises/bleeds  easily.  Psychiatric/Behavioral: Negative for depression, suicidal ideas, hallucinations, memory loss and substance abuse. The patient is nervous/anxious. The patient does not have insomnia.     Physical Exam  Constitutional: He is oriented to person, place, and time. He appears well-developed and well-nourished. No distress.  HENT:  Head: Normocephalic and atraumatic.  Right Ear: External ear normal.  Left Ear: External ear normal.  Nose: Nose normal.  Mouth/Throat: Oropharynx is clear and moist.  Eyes: Conjunctivae and EOM are normal.  Neck: Normal range of motion. Neck supple.  Cardiovascular: Normal rate, regular rhythm, normal heart sounds and intact distal pulses.   No murmur heard. Respiratory: Effort normal and breath sounds normal. No respiratory distress. He has no wheezes.  GI: Soft. Bowel sounds are normal. He exhibits no distension and no mass. There is no tenderness.  Musculoskeletal:       Right hip: Normal.       Left hip: Normal.       Right knee: Normal.       Left knee: Normal.       Lumbar back: He exhibits decreased range of motion, tenderness, pain and spasm.       Right lower leg: He exhibits no tenderness and no swelling.       Left lower leg: He exhibits no tenderness and no swelling.  Neurological: He is alert and oriented to person, place, and time. He has normal strength and normal reflexes. No sensory deficit.  Skin: No rash noted. He is not diaphoretic. No erythema.  Psychiatric: He has a normal mood and affect. His behavior is normal.     Assessment/Plan Lumbar spinal stenosis He needs to have a central decompressive lumbar laminectomy L2-L3, L3-L4, and L4-L5. The possible complications of spinal surgery number one could be infection, which is extremely rare. We do use antibiotics prior to the surgery and during surgery and after surgery. Number two is always a slight degree of probability that you could develop a blood clot in your leg after any type  of surgery and we try our best to prevent that with aspirin post op when it is safe to begin. The third is a dural leak. That is the spinal fluid leak that could occur. At certain rare times the bone or the disc could literally stick to the dura which is the lining which contains the spinal fluid and we could develop a small tear in that lining which we then patch up. That is an extremely rare complication. The last and final complication is a recurrent disc rupture. That means that you could rupture another small piece of disc later on down the road and there is about a 2% chance of that.   Blaine Hari LAUREN 11/21/2012, 12:18 PM

## 2012-11-22 ENCOUNTER — Other Ambulatory Visit (HOSPITAL_COMMUNITY): Payer: Self-pay | Admitting: *Deleted

## 2012-11-23 ENCOUNTER — Encounter (HOSPITAL_COMMUNITY)
Admission: RE | Admit: 2012-11-23 | Discharge: 2012-11-23 | Disposition: A | Discharge status: Home / Self Care | Payer: Medicare Other | Source: Ambulatory Visit | Attending: Orthopedic Surgery | Admitting: Orthopedic Surgery

## 2012-11-23 ENCOUNTER — Ambulatory Visit (HOSPITAL_COMMUNITY)
Admission: RE | Admit: 2012-11-23 | Discharge: 2012-11-23 | Disposition: A | Discharge status: Home / Self Care | Payer: Medicare Other | Source: Ambulatory Visit | Attending: Surgical | Admitting: Surgical

## 2012-11-23 ENCOUNTER — Encounter (HOSPITAL_COMMUNITY): Payer: Self-pay

## 2012-11-23 DIAGNOSIS — M51379 Other intervertebral disc degeneration, lumbosacral region without mention of lumbar back pain or lower extremity pain: Secondary | ICD-10-CM | POA: Insufficient documentation

## 2012-11-23 DIAGNOSIS — Z01812 Encounter for preprocedural laboratory examination: Secondary | ICD-10-CM | POA: Insufficient documentation

## 2012-11-23 DIAGNOSIS — R059 Cough, unspecified: Secondary | ICD-10-CM | POA: Insufficient documentation

## 2012-11-23 DIAGNOSIS — I700 Atherosclerosis of aorta: Secondary | ICD-10-CM | POA: Insufficient documentation

## 2012-11-23 DIAGNOSIS — Z01818 Encounter for other preprocedural examination: Secondary | ICD-10-CM | POA: Insufficient documentation

## 2012-11-23 DIAGNOSIS — M4120 Other idiopathic scoliosis, site unspecified: Secondary | ICD-10-CM | POA: Insufficient documentation

## 2012-11-23 DIAGNOSIS — M5137 Other intervertebral disc degeneration, lumbosacral region: Secondary | ICD-10-CM | POA: Insufficient documentation

## 2012-11-23 DIAGNOSIS — Z01810 Encounter for preprocedural cardiovascular examination: Secondary | ICD-10-CM | POA: Insufficient documentation

## 2012-11-23 DIAGNOSIS — R05 Cough: Secondary | ICD-10-CM | POA: Insufficient documentation

## 2012-11-23 HISTORY — DX: Irritant contact dermatitis due friction or contact with other specified body fluids: L24.A9

## 2012-11-23 HISTORY — DX: Spinal stenosis, lumbar region without neurogenic claudication: M48.061

## 2012-11-23 HISTORY — DX: Other injury of unspecified body region, initial encounter: T14.8XXA

## 2012-11-23 HISTORY — DX: Nocturia: R35.1

## 2012-11-23 HISTORY — DX: Shortness of breath: R06.02

## 2012-11-23 LAB — COMPREHENSIVE METABOLIC PANEL
ALT: 12 U/L (ref 0–53)
AST: 13 U/L (ref 0–37)
Albumin: 4.1 g/dL (ref 3.5–5.2)
Alkaline Phosphatase: 95 U/L (ref 39–117)
BUN: 18 mg/dL (ref 6–23)
CO2: 24 mEq/L (ref 19–32)
Calcium: 10 mg/dL (ref 8.4–10.5)
Chloride: 104 mEq/L (ref 96–112)
Creatinine, Ser: 0.91 mg/dL (ref 0.50–1.35)
GFR calc Af Amer: >90 mL/min (ref >90)
GFR calc non Af Amer: 81 mL/min — ABNORMAL LOW (ref >90)
Glucose, Bld: 96 mg/dL (ref 70–99)
Potassium: 4.2 mEq/L (ref 3.5–5.1)
Sodium: 138 mEq/L (ref 135–145)
Total Bilirubin: 0.3 mg/dL (ref 0.3–1.2)
Total Protein: 7.3 g/dL (ref 6.0–8.3)

## 2012-11-23 LAB — URINALYSIS, ROUTINE W REFLEX MICROSCOPIC
Bilirubin Urine: NEGATIVE
Glucose, UA: NEGATIVE mg/dL
Hgb urine dipstick: NEGATIVE
Ketones, ur: NEGATIVE mg/dL
Leukocytes, UA: NEGATIVE
Nitrite: NEGATIVE
Protein, ur: NEGATIVE mg/dL
Specific Gravity, Urine: 1.03 (ref 1.005–1.030)
Urobilinogen, UA: 1 mg/dL (ref 0.0–1.0)
pH: 6 (ref 5.0–8.0)

## 2012-11-23 LAB — CBC
HCT: 41.5 % (ref 39.0–52.0)
Hemoglobin: 14.1 g/dL (ref 13.0–17.0)
MCH: 30.9 pg (ref 26.0–34.0)
MCHC: 34 g/dL (ref 30.0–36.0)
MCV: 91 fL (ref 78.0–100.0)
Platelets: 324 10*3/uL (ref 150–400)
RBC: 4.56 MIL/uL (ref 4.22–5.81)

## 2012-11-23 LAB — PROTIME-INR
INR: 1.04 (ref 0.00–1.49)
Prothrombin Time: 13.4 seconds (ref 11.6–15.2)

## 2012-11-23 LAB — APTT: aPTT: 37 seconds (ref 24–37)

## 2012-11-23 NOTE — Patient Instructions (Addendum)
Ian Gonzales  11/23/2012                           YOUR PROCEDURE IS SCHEDULED ON: 11/26/12               PLEASE REPORT TO SHORT STAY CENTER AT :  12:00 PM               CALL THIS NUMBER IF ANY PROBLEMS THE DAY OF SURGERY :               [phone]                      REMEMBER:   Do not eat food or drink liquids AFTER MIDNIGHT  May have clear liquids UNTIL 6 HOURS BEFORE SURGERY (8:30 AM)  Clear liquids include soda, tea, black coffee, apple or grape juice, broth.  Take these medicines the morning of surgery with A SIP OF WATER:  AMLODIPINE  / MAY TAKE TRAMADOL IF NEEDED              STOP ALEVE NOW UNTIL AFTER SURGERY  Do not wear jewelry, make-up   Do not wear lotions, powders, or perfumes.   Do not shave legs or underarms 12 hrs. before surgery (men may shave face)  Do not bring valuables to the hospital.  Contacts, dentures or bridgework may not be worn into surgery.  Leave suitcase in the car. After surgery it may be brought to your room.  For patients admitted to the hospital more than one night, checkout time is 11:00                          The day of discharge.   Patients discharged the day of surgery will not be allowed to drive home                             If going home same day of surgery, must have someone stay with you first                           24 hrs at home and arrange for some one to drive you home from hospital.    Special Instructions:   Please read over the following fact sheets that you were given:               1. MRSA  INFORMATION                      2. [place] PREPARING FOR SURGERY SHEET                                                X_____________________________________________________________________        Failure to follow these instructions may result in cancellation of your surgery

## 2012-11-24 ENCOUNTER — Encounter (HOSPITAL_COMMUNITY): Payer: Self-pay | Admitting: Pharmacy Technician

## 2012-11-26 ENCOUNTER — Encounter (HOSPITAL_COMMUNITY): Payer: Self-pay | Admitting: *Deleted

## 2012-11-26 ENCOUNTER — Encounter (HOSPITAL_COMMUNITY): Payer: Medicare Other | Admitting: Anesthesiology

## 2012-11-26 ENCOUNTER — Inpatient Hospital Stay (HOSPITAL_COMMUNITY): Payer: Medicare Other

## 2012-11-26 ENCOUNTER — Encounter (HOSPITAL_COMMUNITY)
Admission: RE | Disposition: A | Discharge status: Home / Self Care | Payer: Self-pay | Source: Ambulatory Visit | Attending: Orthopedic Surgery

## 2012-11-26 ENCOUNTER — Inpatient Hospital Stay (HOSPITAL_COMMUNITY)
Admission: RE | Admit: 2012-11-26 | Discharge: 2012-11-28 | DRG: 520 | Disposition: A | Discharge status: Home / Self Care | Payer: Medicare Other | Source: Ambulatory Visit | Attending: Orthopedic Surgery | Admitting: Orthopedic Surgery

## 2012-11-26 ENCOUNTER — Inpatient Hospital Stay (HOSPITAL_COMMUNITY): Payer: Medicare Other | Admitting: Anesthesiology

## 2012-11-26 DIAGNOSIS — R5381 Other malaise: Secondary | ICD-10-CM | POA: Diagnosis present

## 2012-11-26 DIAGNOSIS — M48062 Spinal stenosis, lumbar region with neurogenic claudication: Principal | ICD-10-CM | POA: Diagnosis present

## 2012-11-26 DIAGNOSIS — G709 Myoneural disorder, unspecified: Secondary | ICD-10-CM | POA: Diagnosis present

## 2012-11-26 DIAGNOSIS — J4489 Other specified chronic obstructive pulmonary disease: Secondary | ICD-10-CM | POA: Diagnosis present

## 2012-11-26 DIAGNOSIS — I10 Essential (primary) hypertension: Secondary | ICD-10-CM | POA: Diagnosis present

## 2012-11-26 DIAGNOSIS — J449 Chronic obstructive pulmonary disease, unspecified: Secondary | ICD-10-CM | POA: Diagnosis present

## 2012-11-26 DIAGNOSIS — Z6827 Body mass index (BMI) 27.0-27.9, adult: Secondary | ICD-10-CM

## 2012-11-26 DIAGNOSIS — Z860100 Personal history of colon polyps, unspecified: Secondary | ICD-10-CM

## 2012-11-26 DIAGNOSIS — F17200 Nicotine dependence, unspecified, uncomplicated: Secondary | ICD-10-CM | POA: Diagnosis present

## 2012-11-26 DIAGNOSIS — K219 Gastro-esophageal reflux disease without esophagitis: Secondary | ICD-10-CM | POA: Diagnosis present

## 2012-11-26 DIAGNOSIS — M5430 Sciatica, unspecified side: Secondary | ICD-10-CM | POA: Diagnosis present

## 2012-11-26 HISTORY — PX: DECOMPRESSIVE LUMBAR LAMINECTOMY LEVEL 2: SHX5792

## 2012-11-26 SURGERY — DECOMPRESSIVE LUMBAR LAMINECTOMY LEVEL 2
Anesthesia: General | Site: Back | Wound class: Clean

## 2012-11-26 MED ORDER — GLYCOPYRROLATE 0.2 MG/ML IJ SOLN
INTRAMUSCULAR | Status: DC | PRN
  Administered 2012-11-26: 0.6 mg via INTRAVENOUS

## 2012-11-26 MED ORDER — MIDAZOLAM HCL 5 MG/5ML IJ SOLN
INTRAMUSCULAR | Status: DC | PRN
  Administered 2012-11-26: 0.5 mg via INTRAVENOUS
  Administered 2012-11-26: 1 mg via INTRAVENOUS
  Administered 2012-11-26: .5 mg via INTRAVENOUS

## 2012-11-26 MED ORDER — PNEUMOCOCCAL VAC POLYVALENT 25 MCG/0.5ML IJ INJ
0.5000 mL | INJECTION | INTRAMUSCULAR | Status: AC
  Administered 2012-11-27: 10:00:00 0.5 mL via INTRAMUSCULAR
  Filled 2012-11-26 (×2): qty 0.5

## 2012-11-26 MED ORDER — BUPIVACAINE LIPOSOME 1.3 % IJ SUSP
INTRAMUSCULAR | Status: DC | PRN
  Administered 2012-11-26: 20 mL

## 2012-11-26 MED ORDER — FAMOTIDINE 10 MG PO TABS
10.0000 mg | ORAL_TABLET | Freq: Every day | ORAL | Status: DC
  Administered 2012-11-26 – 2012-11-28 (×3): 10 mg via ORAL
  Filled 2012-11-26 (×3): qty 1

## 2012-11-26 MED ORDER — THROMBIN 5000 UNITS EX SOLR
CUTANEOUS | Status: AC
  Filled 2012-11-26: qty 10000

## 2012-11-26 MED ORDER — INFLUENZA VAC SPLIT QUAD 0.5 ML IM SUSP
0.5000 mL | INTRAMUSCULAR | Status: AC
  Administered 2012-11-27: 0.5 mL via INTRAMUSCULAR
  Filled 2012-11-26 (×2): qty 0.5

## 2012-11-26 MED ORDER — CEFAZOLIN SODIUM-DEXTROSE 2-3 GM-% IV SOLR
2.0000 g | INTRAVENOUS | Status: AC
  Administered 2012-11-26: 2 g via INTRAVENOUS

## 2012-11-26 MED ORDER — BACITRACIN-NEOMYCIN-POLYMYXIN 400-5-5000 EX OINT
TOPICAL_OINTMENT | CUTANEOUS | Status: AC
  Filled 2012-11-26: qty 1

## 2012-11-26 MED ORDER — CEFAZOLIN SODIUM 1-5 GM-% IV SOLN
1.0000 g | Freq: Three times a day (TID) | INTRAVENOUS | Status: AC
  Administered 2012-11-26 – 2012-11-27 (×3): 1 g via INTRAVENOUS
  Filled 2012-11-26 (×5): qty 50

## 2012-11-26 MED ORDER — SODIUM CHLORIDE 0.9 % IJ SOLN
INTRAMUSCULAR | Status: AC
  Filled 2012-11-26: qty 50

## 2012-11-26 MED ORDER — BUPIVACAINE-EPINEPHRINE PF 0.5-1:200000 % IJ SOLN
INTRAMUSCULAR | Status: DC | PRN
  Administered 2012-11-26: 20 mL

## 2012-11-26 MED ORDER — ACETAMINOPHEN 325 MG PO TABS: 650.0000 mg | ORAL_TABLET | ORAL | Status: DC | PRN

## 2012-11-26 MED ORDER — METHOCARBAMOL 500 MG PO TABS
500.0000 mg | ORAL_TABLET | Freq: Four times a day (QID) | ORAL | Status: DC | PRN
  Administered 2012-11-27 – 2012-11-28 (×3): 500 mg via ORAL
  Filled 2012-11-26 (×3): qty 1

## 2012-11-26 MED ORDER — THROMBIN 5000 UNITS EX SOLR
CUTANEOUS | Status: DC | PRN
  Administered 2012-11-26: 10000 [IU] via TOPICAL

## 2012-11-26 MED ORDER — BACITRACIN-NEOMYCIN-POLYMYXIN 400-5-5000 EX OINT
TOPICAL_OINTMENT | CUTANEOUS | Status: DC | PRN
  Administered 2012-11-26: 1 via TOPICAL

## 2012-11-26 MED ORDER — ROCURONIUM BROMIDE 100 MG/10ML IV SOLN
INTRAVENOUS | Status: DC | PRN
  Administered 2012-11-26: 10 mg via INTRAVENOUS
  Administered 2012-11-26: 45 mg via INTRAVENOUS
  Administered 2012-11-26: 10 mg via INTRAVENOUS
  Administered 2012-11-26: 5 mg via INTRAVENOUS

## 2012-11-26 MED ORDER — ONDANSETRON HCL 4 MG/2ML IJ SOLN
INTRAMUSCULAR | Status: DC | PRN
  Administered 2012-11-26: 4 mg via INTRAMUSCULAR

## 2012-11-26 MED ORDER — BISACODYL 10 MG RE SUPP: 10.0000 mg | Freq: Every day | RECTAL | Status: DC | PRN

## 2012-11-26 MED ORDER — HYDROMORPHONE HCL PF 1 MG/ML IJ SOLN
INTRAMUSCULAR | Status: DC | PRN
  Administered 2012-11-26 (×2): 1 mg via INTRAVENOUS

## 2012-11-26 MED ORDER — BUPIVACAINE LIPOSOME 1.3 % IJ SUSP
20.0000 mL | Freq: Once | INTRAMUSCULAR | Status: DC
  Filled 2012-11-26: qty 20

## 2012-11-26 MED ORDER — SODIUM CHLORIDE 0.9 % IR SOLN
Status: DC | PRN
  Administered 2012-11-26: 15:00:00

## 2012-11-26 MED ORDER — ONDANSETRON HCL 4 MG/2ML IJ SOLN: 4.0000 mg | INTRAMUSCULAR | Status: DC | PRN

## 2012-11-26 MED ORDER — ACETAMINOPHEN 650 MG RE SUPP: 650.0000 mg | RECTAL | Status: DC | PRN

## 2012-11-26 MED ORDER — METHOCARBAMOL 100 MG/ML IJ SOLN
500.0000 mg | Freq: Four times a day (QID) | INTRAVENOUS | Status: DC | PRN
  Filled 2012-11-26: qty 5

## 2012-11-26 MED ORDER — ACETAMINOPHEN 10 MG/ML IV SOLN
INTRAVENOUS | Status: DC | PRN
  Administered 2012-11-26: 1000 mg via INTRAVENOUS

## 2012-11-26 MED ORDER — HYDROMORPHONE HCL PF 1 MG/ML IJ SOLN
INTRAMUSCULAR | Status: AC
  Filled 2012-11-26: qty 1

## 2012-11-26 MED ORDER — MENTHOL 3 MG MT LOZG: 1.0000 | LOZENGE | OROMUCOSAL | Status: DC | PRN

## 2012-11-26 MED ORDER — LACTATED RINGERS IV SOLN
INTRAVENOUS | Status: DC
  Administered 2012-11-26: 15:00:00 via INTRAVENOUS
  Administered 2012-11-26: 1000 mL via INTRAVENOUS

## 2012-11-26 MED ORDER — BUPIVACAINE LIPOSOME 1.3 % IJ SUSP
Freq: Once | INTRAMUSCULAR | Status: DC
  Filled 2012-11-26 (×2): qty 20

## 2012-11-26 MED ORDER — HYDROCODONE-ACETAMINOPHEN 5-325 MG PO TABS
1.0000 | ORAL_TABLET | ORAL | Status: DC | PRN
  Administered 2012-11-27: 07:00:00 1 via ORAL
  Filled 2012-11-26: qty 1

## 2012-11-26 MED ORDER — DEXAMETHASONE SODIUM PHOSPHATE 10 MG/ML IJ SOLN
INTRAMUSCULAR | Status: DC | PRN
  Administered 2012-11-26: 10 mg via INTRAVENOUS

## 2012-11-26 MED ORDER — PROMETHAZINE HCL 25 MG/ML IJ SOLN: 6.2500 mg | INTRAMUSCULAR | Status: DC | PRN

## 2012-11-26 MED ORDER — AMLODIPINE BESYLATE 5 MG PO TABS
5.0000 mg | ORAL_TABLET | Freq: Every morning | ORAL | Status: DC
  Administered 2012-11-27 – 2012-11-28 (×2): 5 mg via ORAL
  Filled 2012-11-26 (×2): qty 1

## 2012-11-26 MED ORDER — ACETAMINOPHEN 10 MG/ML IV SOLN
1000.0000 mg | Freq: Once | INTRAVENOUS | Status: DC
  Filled 2012-11-26: qty 100

## 2012-11-26 MED ORDER — BUPIVACAINE-EPINEPHRINE PF 0.5-1:200000 % IJ SOLN
INTRAMUSCULAR | Status: AC
  Filled 2012-11-26: qty 30

## 2012-11-26 MED ORDER — OXYCODONE-ACETAMINOPHEN 5-325 MG PO TABS
1.0000 | ORAL_TABLET | ORAL | Status: DC | PRN
  Administered 2012-11-27 – 2012-11-28 (×4): 2 via ORAL
  Filled 2012-11-26 (×4): qty 2

## 2012-11-26 MED ORDER — SUFENTANIL CITRATE 50 MCG/ML IV SOLN
INTRAVENOUS | Status: DC | PRN
  Administered 2012-11-26: 5 ug via INTRAVENOUS
  Administered 2012-11-26: 10 ug via INTRAVENOUS
  Administered 2012-11-26: 5 ug via INTRAVENOUS

## 2012-11-26 MED ORDER — HYDROMORPHONE HCL PF 1 MG/ML IJ SOLN
0.2500 mg | INTRAMUSCULAR | Status: DC | PRN
  Administered 2012-11-26 (×2): 0.5 mg via INTRAVENOUS

## 2012-11-26 MED ORDER — CEFAZOLIN SODIUM-DEXTROSE 2-3 GM-% IV SOLR
INTRAVENOUS | Status: AC
  Filled 2012-11-26: qty 50

## 2012-11-26 MED ORDER — PHENYLEPHRINE HCL 10 MG/ML IJ SOLN
INTRAMUSCULAR | Status: DC | PRN
  Administered 2012-11-26: 80 ug via INTRAVENOUS
  Administered 2012-11-26 (×2): 160 ug via INTRAVENOUS

## 2012-11-26 MED ORDER — SODIUM CHLORIDE 0.9 % IR SOLN
Status: DC | PRN
  Administered 2012-11-26: 16:00:00

## 2012-11-26 MED ORDER — ALBUTEROL SULFATE HFA 108 (90 BASE) MCG/ACT IN AERS: 2.0000 | INHALATION_SPRAY | Freq: Four times a day (QID) | RESPIRATORY_TRACT | Status: DC | PRN

## 2012-11-26 MED ORDER — PHENOL 1.4 % MT LIQD: 1.0000 | OROMUCOSAL | Status: DC | PRN

## 2012-11-26 MED ORDER — PHENYLEPHRINE HCL 10 MG/ML IJ SOLN
10.0000 mg | INTRAVENOUS | Status: DC | PRN
  Administered 2012-11-26: 15 ug/min via INTRAVENOUS

## 2012-11-26 MED ORDER — NEOSTIGMINE METHYLSULFATE 1 MG/ML IJ SOLN
INTRAMUSCULAR | Status: DC | PRN
  Administered 2012-11-26: 4 mg via INTRAVENOUS

## 2012-11-26 MED ORDER — PROPOFOL 10 MG/ML IV BOLUS
INTRAVENOUS | Status: DC | PRN
  Administered 2012-11-26: 170 mg via INTRAVENOUS

## 2012-11-26 MED ORDER — FLUTICASONE PROPIONATE 50 MCG/ACT NA SUSP
2.0000 | Freq: Every day | NASAL | Status: DC | PRN
  Administered 2012-11-27 – 2012-11-28 (×2): 2 via NASAL
  Filled 2012-11-26: qty 16

## 2012-11-26 MED ORDER — FLEET ENEMA 7-19 GM/118ML RE ENEM: 1.0000 | ENEMA | Freq: Once | RECTAL | Status: AC | PRN

## 2012-11-26 MED ORDER — LIDOCAINE HCL (CARDIAC) 20 MG/ML IV SOLN
INTRAVENOUS | Status: DC | PRN
  Administered 2012-11-26: 75 mg via INTRAVENOUS

## 2012-11-26 MED ORDER — HYDROMORPHONE HCL PF 1 MG/ML IJ SOLN: 0.5000 mg | INTRAMUSCULAR | Status: DC | PRN

## 2012-11-26 MED ORDER — EPHEDRINE SULFATE 50 MG/ML IJ SOLN
INTRAMUSCULAR | Status: DC | PRN
  Administered 2012-11-26: 10 mg via INTRAVENOUS

## 2012-11-26 MED ORDER — POLYETHYLENE GLYCOL 3350 17 G PO PACK: 17.0000 g | PACK | Freq: Every day | ORAL | Status: DC | PRN

## 2012-11-26 MED ORDER — LACTATED RINGERS IV SOLN
INTRAVENOUS | Status: DC
  Administered 2012-11-26: 21:00:00 via INTRAVENOUS

## 2012-11-26 SURGICAL SUPPLY — 45 items
BAG ZIPLOCK 12X15 (MISCELLANEOUS) ×2 IMPLANT
BENZOIN TINCTURE PRP APPL 2/3 (GAUZE/BANDAGES/DRESSINGS) ×2 IMPLANT
CLEANER TIP ELECTROSURG 2X2 (MISCELLANEOUS) ×2 IMPLANT
CLOTH BEACON ORANGE TIMEOUT ST (SAFETY) ×2 IMPLANT
CONT SPECI 4OZ STER CLIK (MISCELLANEOUS) IMPLANT
DRAIN PENROSE 18X1/4 LTX STRL (WOUND CARE) IMPLANT
DRAPE LG THREE QUARTER DISP (DRAPES) ×2 IMPLANT
DRAPE MICROSCOPE LEICA (MISCELLANEOUS) ×2 IMPLANT
DRAPE POUCH INSTRU U-SHP 10X18 (DRAPES) ×2 IMPLANT
DRAPE SURG 17X11 SM STRL (DRAPES) ×2 IMPLANT
DRSG ADAPTIC 3X8 NADH LF (GAUZE/BANDAGES/DRESSINGS) ×2 IMPLANT
DRSG PAD ABDOMINAL 8X10 ST (GAUZE/BANDAGES/DRESSINGS) ×8 IMPLANT
DURAPREP 26ML APPLICATOR (WOUND CARE) ×2 IMPLANT
ELECT BLADE TIP CTD 4 INCH (ELECTRODE) ×2 IMPLANT
ELECT REM PT RETURN 9FT ADLT (ELECTROSURGICAL) ×2
ELECTRODE REM PT RTRN 9FT ADLT (ELECTROSURGICAL) ×1 IMPLANT
GLOVE BIOGEL PI IND STRL 8 (GLOVE) ×1 IMPLANT
GLOVE BIOGEL PI INDICATOR 8 (GLOVE) ×1
GLOVE ECLIPSE 8.0 STRL XLNG CF (GLOVE) ×4 IMPLANT
GOWN STRL REIN XL XLG (GOWN DISPOSABLE) ×4 IMPLANT
KIT BASIN OR (CUSTOM PROCEDURE TRAY) ×2 IMPLANT
KIT POSITIONING SURG ANDREWS (MISCELLANEOUS) ×2 IMPLANT
MANIFOLD NEPTUNE II (INSTRUMENTS) ×2 IMPLANT
NEEDLE HYPO 22GX1.5 SAFETY (NEEDLE) ×2 IMPLANT
NEEDLE SPNL 18GX3.5 QUINCKE PK (NEEDLE) ×4 IMPLANT
NS IRRIG 1000ML POUR BTL (IV SOLUTION) IMPLANT
PATTIES SURGICAL .5 X.5 (GAUZE/BANDAGES/DRESSINGS) ×2 IMPLANT
PATTIES SURGICAL .75X.75 (GAUZE/BANDAGES/DRESSINGS) IMPLANT
PATTIES SURGICAL 1X1 (DISPOSABLE) IMPLANT
PIN SAFETY NICK PLATE  2 MED (MISCELLANEOUS)
PIN SAFETY NICK PLATE 2 MED (MISCELLANEOUS) IMPLANT
POSITIONER SURGICAL ARM (MISCELLANEOUS) ×2 IMPLANT
SPONGE GAUZE 4X4 12PLY (GAUZE/BANDAGES/DRESSINGS) ×2 IMPLANT
SPONGE LAP 4X18 X RAY DECT (DISPOSABLE) ×8 IMPLANT
SPONGE SURGIFOAM ABS GEL 100 (HEMOSTASIS) ×2 IMPLANT
STAPLER VISISTAT 35W (STAPLE) ×2 IMPLANT
SUT VIC AB 0 CT1 27 (SUTURE)
SUT VIC AB 0 CT1 27XBRD ANTBC (SUTURE) IMPLANT
SUT VIC AB 1 CT1 27 (SUTURE) ×4
SUT VIC AB 1 CT1 27XBRD ANTBC (SUTURE) ×4 IMPLANT
SYR 20CC LL (SYRINGE) ×4 IMPLANT
TOWEL OR 17X26 10 PK STRL BLUE (TOWEL DISPOSABLE) ×2 IMPLANT
TOWEL OR NON WOVEN STRL DISP B (DISPOSABLE) ×2 IMPLANT
TRAY FOLEY CATH 14FRSI W/METER (CATHETERS) ×2 IMPLANT
TRAY LAMINECTOMY (CUSTOM PROCEDURE TRAY) ×2 IMPLANT

## 2012-11-26 NOTE — Interval H&P Note (Signed)
History and Physical Interval Note:  11/26/2012 2:13 PM  Ian Gonzales  has presented today for surgery, with the diagnosis of SPINAL STENOSIS   The various methods of treatment have been discussed with the patient and family. After consideration of risks, benefits and other options for treatment, the patient has consented to  Procedure(s): CENTRAL DECOMPRESSION LUMBAR LAMINECTOMY L2-L3, L3-L4,  L4-L5    (N/A) as a surgical intervention .  The patient's history has been reviewed, patient examined, no change in status, stable for surgery.  I have reviewed the patient's chart and labs.  Questions were answered to the patient's satisfaction.     Klayton Monie A

## 2012-11-26 NOTE — Transfer of Care (Signed)
Immediate Anesthesia Transfer of Care Note  Patient: Ian Gonzales  Procedure(s) Performed: Procedure(s) (LRB): CENTRAL DECOMPRESSION LUMBAR LAMINECTOMY L3-L4,  L4-L5    (N/A)  Patient Location: PACU  Anesthesia Type: General  Level of Consciousness: sedated, patient cooperative and responds to stimulation  Airway & Oxygen Therapy: Patient Spontanous Breathing and Patient connected to face mask oxgen  Post-op Assessment: Report given to PACU RN and Post -op Vital signs reviewed and stable  Post vital signs: Reviewed and stable  Complications: No apparent anesthesia complications

## 2012-11-26 NOTE — Anesthesia Postprocedure Evaluation (Signed)
  Anesthesia Post-op Note  Patient: Ian Gonzales  Procedure(s) Performed: Procedure(s) (LRB): CENTRAL DECOMPRESSION LUMBAR LAMINECTOMY L3-L4,  L4-L5    (N/A)  Patient Location: PACU  Anesthesia Type: General  Level of Consciousness: awake and alert   Airway and Oxygen Therapy: Patient Spontanous Breathing  Post-op Pain: mild  Post-op Assessment: Post-op Vital signs reviewed, Patient's Cardiovascular Status Stable, Respiratory Function Stable, Patent Airway and No signs of Nausea or vomiting  Last Vitals:  Filed Vitals:   11/26/12 1853  BP: 142/77  Pulse: 78  Temp: 36.6 C  Resp:     Post-op Vital Signs: stable   Complications: No apparent anesthesia complications

## 2012-11-26 NOTE — Anesthesia Preprocedure Evaluation (Addendum)
Anesthesia Evaluation  Patient identified by MRN, date of birth, ID band Patient awake    Reviewed: Allergy & Precautions, H&P , NPO status , Patient's Chart, lab work & pertinent test results  Airway Mallampati: II TM Distance: >3 FB Neck ROM: Full    Dental  (+) Edentulous Upper and Edentulous Lower   Pulmonary shortness of breath, COPDCurrent Smoker,  breath sounds clear to auscultation  Pulmonary exam normal       Cardiovascular hypertension, Pt. on medications negative cardio ROS  Rhythm:Regular Rate:Normal  Lexiscan stress test 01-16-11: Normal EF 65%. No ischemia.   Neuro/Psych PSYCHIATRIC DISORDERS  Neuromuscular disease    GI/Hepatic Neg liver ROS, GERD-  Medicated,  Endo/Other  negative endocrine ROS  Renal/GU negative Renal ROS  negative genitourinary   Musculoskeletal negative musculoskeletal ROS (+)   Abdominal   Peds negative pediatric ROS (+)  Hematology negative hematology ROS (+)   Anesthesia Other Findings   Reproductive/Obstetrics negative OB ROS                         Anesthesia Physical Anesthesia Plan  ASA: III  Anesthesia Plan: General   Post-op Pain Management:    Induction: Intravenous  Airway Management Planned: Oral ETT  Additional Equipment:   Intra-op Plan:   Post-operative Plan: Extubation in OR  Informed Consent: I have reviewed the patients History and Physical, chart, labs and discussed the procedure including the risks, benefits and alternatives for the proposed anesthesia with the patient or authorized representative who has indicated his/her understanding and acceptance.   Dental advisory given  Plan Discussed with: CRNA  Anesthesia Plan Comments:         Anesthesia Quick Evaluation

## 2012-11-26 NOTE — Brief Op Note (Signed)
11/26/2012  5:43 PM  PATIENT:  Ian Gonzales  71 y.o. male  PRE-OPERATIVE DIAGNOSIS:  SPINAL STENOSIS LUMBAR,AT TWO LEVELS  POST-OPERATIVE DIAGNOSIS:  SPINAL STENOSIS LUMBAR AT TWO LEVELS  PROCEDURE:  Procedure(s): CENTRAL DECOMPRESSION LUMBAR LAMINECTOMY L3-L4,  L4-L5    (N/A) FOR SEVERE STENOSIS AT two LEVELS.  SURGEON:  Surgeon(s) and Role:    * Jacki Cones, MD - Primary    * Drucilla Schmidt, MD - Assisting     ASSISTANTS:James Aplington MD   ANESTHESIA:   general  EBL:  Total I/O In: 1000 [I.V.:1000] Out: 300 [Urine:200; Blood:100]  BLOOD ADMINISTERED:none  DRAINS: none   LOCAL MEDICATIONS USED:  MARCAINE 20cc 0.50% WITH EPINEPHRINE AND 20cc of Exparel   SPECIMEN:  No Specimen  DISPOSITION OF SPECIMEN:  N/A  COUNTS:  YES  TOURNIQUET:  * No tourniquets in log *  DICTATION: .Other Dictation: Dictation Number [id number]  PLAN OF CARE: Admit to inpatient   PATIENT DISPOSITION:  PACU - hemodynamically stable.   Delay start of Pharmacological VTE agent (>24hrs) due to surgical blood loss or risk of bleeding: yes

## 2012-11-27 NOTE — Evaluation (Signed)
Occupational Therapy Evaluation Patient Details Name: Ian Gonzales MRN: [id number] DOB: April 10, 1940 Today's Date: 11/27/2012 Time: 0865-7846 OT Time Calculation (min): 23 min  OT Assessment / Plan / Recommendation History of present illness CENTRAL DECOMPRESSION LUMBAR LAMINECTOMY L3-L4,  L4-L5    Clinical Impression   Pt doing well. Has intermittent help at home. Reviewed back precautions and how to apply with functional mobility and ADL. Will benefit from OT while on acute to reinforce precautions and increase independence with self care tasks for d/c home.    OT Assessment  Patient needs continued OT Services    Follow Up Recommendations  No OT follow up;Supervision - Intermittent    Barriers to Discharge      Equipment Recommendations  None recommended by OT    Recommendations for Other Services    Frequency  Min 2X/week    Precautions / Restrictions Precautions Precautions: Back Precaution Booklet Issued: Yes (comment) Precaution Comments: Backcare handout in room. reviewed all precautions with pt and how to apply to ADL Restrictions Weight Bearing Restrictions: No   Pertinent Vitals/Pain 5/10; reposition, rest    ADL  Eating/Feeding: Simulated;Independent Where Assessed - Eating/Feeding: Chair Grooming: Simulated;Wash/dry hands;Min guard Where Assessed - Grooming: Unsupported standing Upper Body Bathing: Simulated;Chest;Right arm;Left arm;Abdomen;Supervision/safety Where Assessed - Upper Body Bathing: Unsupported sitting Lower Body Bathing: Simulated;Min guard Where Assessed - Lower Body Bathing: Supported sit to stand Upper Body Dressing: Simulated;Set up;Supervision/safety Where Assessed - Upper Body Dressing: Unsupported sitting Lower Body Dressing: Simulated;Min guard Where Assessed - Lower Body Dressing: Supported sit to stand Toilet Transfer: Performed;Min guard Acupuncturist: Comfort height toilet;Grab bars Toileting - Designer, fashion/clothing and Hygiene: Simulated;Min guard Where Assessed - Engineer, mining and Hygiene: Sit to stand from 3-in-1 or toilet Tub/Shower Transfer: Simulated;Min guard (side step over tub) Equipment Used: Long-handled sponge;Reacher ADL Comments: Pt will be mostly alone but has a friend that can check in and out. He states he will likely do his own laundry, meals so discussed use of reacher to help with IADL. Pt states he can obtain on his own. Also discussed LHS and pt states he may have one but if not, can also obtain one. He did well with comfort height commode with bar and states he has a vanity next to commode at home. Discussed tubseat option if he has one or obtains one.    OT Diagnosis: Generalized weakness  OT Problem List: Decreased strength;Decreased knowledge of use of DME or AE;Decreased knowledge of precautions OT Treatment Interventions: Self-care/ADL training;DME and/or AE instruction;Therapeutic activities;Patient/family education   OT Goals(Current goals can be found in the care plan section) Acute Rehab OT Goals Patient Stated Goal: to be independent OT Goal Formulation: With patient Time For Goal Achievement: 12/04/12 Potential to Achieve Goals: Good  Visit Information  Last OT Received On: 11/27/12 Assistance Needed: +1 History of Present Illness: CENTRAL DECOMPRESSION LUMBAR LAMINECTOMY L3-L4,  L4-L5        Prior Functioning     Home Living Family/patient expects to be discharged to:: Private residence Living Arrangements: Alone Available Help at Discharge: Friend(s);Available PRN/intermittently Type of Home: House Home Layout: One level Additional Comments: pt unsure but states he may have a 3in1 and tubseat in storage building as well as a walker. Prior Function Level of Independence: Independent Communication Communication: No difficulties         Vision/Perception     Cognition  Cognition Arousal/Alertness:  Awake/alert Behavior During Therapy: WFL for tasks assessed/performed Overall Cognitive Status: Within  Functional Limits for tasks assessed    Extremity/Trunk Assessment Upper Extremity Assessment Upper Extremity Assessment: Overall WFL for tasks assessed     Mobility Transfers Transfers: Sit to Stand;Stand to Sit Sit to Stand: 4: Min guard;With upper extremity assist;From chair/3-in-1;From toilet Stand to Sit: 4: Min guard;With upper extremity assist;To chair/3-in-1;To toilet Details for Transfer Assistance: verbal cues for back precautions to maintain erect posture.     Exercise     Balance Balance Balance Assessed: Yes Dynamic Standing Balance Dynamic Standing - Level of Assistance: 5: Stand by assistance   End of Session OT - End of Session Activity Tolerance: Patient tolerated treatment well Patient left: in chair;with call bell/phone within reach  GO     Lennox Laity [phone] 11/27/2012, 10:13 AM

## 2012-11-27 NOTE — Progress Notes (Signed)
Subjective: 1 Day Post-Op Procedure(s) (LRB): CENTRAL DECOMPRESSION LUMBAR LAMINECTOMY L3-L4,  L4-L5    (N/A) Patient reports pain as mild.   Patient seen in rounds with Dr. Lequita Halt. Some complaints of sore throat and stuffy nose.  Patient is well, but has had some minor complaints of pain in the back at area of surgery, requiring pain medications We will start therapy today.  Plan is to go Home after hospital stay.  Objective: Vital signs in last 24 hours: Temp:  [97.4 F (36.3 C)-98.7 F (37.1 C)] 98.1 F (36.7 C) (10/18 0545) Pulse Rate:  [75-108] 95 (10/18 0545) Resp:  [11-20] 18 (10/18 0545) BP: (125-154)/(61-91) 135/75 mmHg (10/18 0545) SpO2:  [92 %-100 %] 95 % (10/18 0545) Weight:  [85.021 kg (187 lb 7 oz)] 85.021 kg (187 lb 7 oz) (10/17 2206)  Intake/Output from previous day: 10/17 0701 - 10/18 0700 In: 3343.3 [P.O.:600; I.V.:2743.3] Out: 2600 [Urine:2500; Blood:100] Intake/Output this shift:    No results found for this basename: HGB,  in the last 72 hours No results found for this basename: WBC, RBC, HCT, PLT,  in the last 72 hours No results found for this basename: NA, K, CL, CO2, BUN, CREATININE, GLUCOSE, CALCIUM,  in the last 72 hours No results found for this basename: LABPT, INR,  in the last 72 hours  EXAM General - Patient is Alert, Appropriate and Oriented Extremity - Neurovascular intact Sensation intact distally Dorsiflexion/Plantar flexion intact Dressing - dressing C/D/I Motor Function - intact, moving feet and toes well on exam.   Past Medical History  Diagnosis Date  . Hypertension   . GERD (gastroesophageal reflux disease)   . Colon polyps   . Back pain, lumbosacral   . Sinusitis acute 03/24/2011  . Memory loss 04/16/2011  . Allergic state 05/30/2011  . Leukocytosis 12/15/2011  . Hyperlipidemia 02/15/2012  . Unspecified sinusitis (chronic) 04/05/2012  . Epicondylitis 10/10/2012  . Shortness of breath     WITH EXERTION AND BACK PAIN  . Wound  drainage     L ELBOW  . Spinal stenosis, lumbar   . Nocturia     Assessment/Plan: 1 Day Post-Op Procedure(s) (LRB): CENTRAL DECOMPRESSION LUMBAR LAMINECTOMY L3-L4,  L4-L5    (N/A) Active Problems:   Spinal stenosis, lumbar region, with neurogenic claudication  Estimated body mass index is 27.29 kg/(m^2) as calculated from the following:   Height as of this encounter: 5' 9.5" (1.765 m).   Weight as of this encounter: 85.021 kg (187 lb 7 oz). Up with therapy Plan for discharge tomorrow Discharge home  Weight-Bearing as tolerated to both legs Back precautions  Ian Gonzales 11/27/2012, 7:59 AM  1

## 2012-11-27 NOTE — Evaluation (Signed)
Physical Therapy Evaluation Patient Details Name: Ian Gonzales MRN: [id number] DOB: 06-24-1940 Today's Date: 11/27/2012 Time: 4098-1191 PT Time Calculation (min): 27 min  PT Assessment / Plan / Recommendation History of Present Illness  CENTRAL DECOMPRESSION LUMBAR LAMINECTOMY L3-L4,  L4-L5   Clinical Impression  Pt s/p back surgery presents with mobility limitations 2* post op back precautions and discomfort.  Pt should progress to d/c home at Mod I level with intermittant assist of family/friends    PT Assessment  Patient needs continued PT services    Follow Up Recommendations  No PT follow up    Does the patient have the potential to tolerate intense rehabilitation      Barriers to Discharge        Equipment Recommendations  None recommended by PT    Recommendations for Other Services     Frequency 7X/week    Precautions / Restrictions Precautions Precautions: Back Precaution Booklet Issued: Yes (comment) Precaution Comments: All precautions reviewed Restrictions Weight Bearing Restrictions: No   Pertinent Vitals/Pain 4/10; premed      Mobility  Bed Mobility Bed Mobility: Supine to Sit Supine to Sit: 4: Min guard Details for Bed Mobility Assistance: cues for log roll technique Transfers Transfers: Sit to Stand;Stand to Sit Sit to Stand: 4: Min guard;With upper extremity assist;From bed Stand to Sit: 4: Min guard;With upper extremity assist;To chair/3-in-1 Details for Transfer Assistance: verbal cues for back precautions to maintain erect posture. Ambulation/Gait Ambulation/Gait Assistance: 4: Min guard Ambulation Distance (Feet): 222 Feet Assistive device: Rolling walker;None Ambulation/Gait Assistance Details: half distance with RW, half with min guard assist Gait Pattern: Step-through pattern;Trunk flexed Stairs: No    Exercises     PT Diagnosis: Difficulty walking  PT Problem List: Decreased strength;Decreased activity tolerance;Decreased  mobility;Decreased knowledge of use of DME;Pain;Decreased knowledge of precautions PT Treatment Interventions: DME instruction;Gait training;Stair training;Functional mobility training;Therapeutic activities;Patient/family education     PT Goals(Current goals can be found in the care plan section) Acute Rehab PT Goals Patient Stated Goal: to be independent PT Goal Formulation: With patient Time For Goal Achievement: 11/30/12 Potential to Achieve Goals: Good  Visit Information  Last PT Received On: 11/27/12 Assistance Needed: +1 History of Present Illness: CENTRAL DECOMPRESSION LUMBAR LAMINECTOMY L3-L4,  L4-L5        Prior Functioning  Home Living Family/patient expects to be discharged to:: Private residence Living Arrangements: Alone Available Help at Discharge: Friend(s);Available PRN/intermittently Type of Home: House Home Access: Stairs to enter [employer] of Steps: 1 Entrance Stairs-Rails: None Home Layout: One level Home Equipment: Other (comment) Additional Comments: Pt states he might have RW in garage Prior Function Level of Independence: Independent Communication Communication: No difficulties    Cognition  Cognition Arousal/Alertness: Awake/alert Behavior During Therapy: WFL for tasks assessed/performed Overall Cognitive Status: Within Functional Limits for tasks assessed    Extremity/Trunk Assessment Upper Extremity Assessment Upper Extremity Assessment: Overall WFL for tasks assessed Lower Extremity Assessment Lower Extremity Assessment: Overall WFL for tasks assessed   Balance Balance Balance Assessed: Yes Dynamic Standing Balance Dynamic Standing - Level of Assistance: 5: Stand by assistance  End of Session PT - End of Session Activity Tolerance: Patient tolerated treatment well Patient left: in chair;with call bell/phone within reach Nurse Communication: Mobility status  GP     Brit Carbonell 11/27/2012, 12:47 PM

## 2012-11-27 NOTE — Progress Notes (Signed)
Physical Therapy Treatment Patient Details Name: Ian Gonzales MRN: [id number] DOB: [birth year] Today's Date: 11/27/2012 Time: 1512 [id number] PT Time Calculation (min): 21 min  PT Assessment / Plan / Recommendation  History of Present Illness CENTRAL DECOMPRESSION LUMBAR LAMINECTOMY L3-L4,  L4-L5    PT Comments     Follow Up Recommendations  No PT follow up     Does the patient have the potential to tolerate intense rehabilitation     Barriers to Discharge        Equipment Recommendations  None recommended by PT    Recommendations for Other Services    Frequency 7X/week   Progress towards PT Goals Progress towards PT goals: Progressing toward goals  Plan Current plan remains appropriate    Precautions / Restrictions Precautions Precautions: Back Precaution Booklet Issued: Yes (comment) Precaution Comments: All precautions reviewed Restrictions Weight Bearing Restrictions: No   Pertinent Vitals/Pain 4/5, RN aware and offering muscle relax    Mobility  Bed Mobility Bed Mobility: Supine to Sit;Sit to Supine Supine to Sit: 5: Supervision Sit to Supine: 5: Supervision Details for Bed Mobility Assistance: cues for log roll technique Transfers Transfers: Sit to Stand;Stand to Sit Sit to Stand: 4: Min guard;With upper extremity assist;From bed Stand to Sit: 5: Supervision;With upper extremity assist Details for Transfer Assistance: verbal cues for back precautions to maintain erect posture. Ambulation/Gait Ambulation/Gait Assistance: 4: Min guard;5: Supervision Ambulation Distance (Feet): 650 Feet Assistive device: None Ambulation/Gait Assistance Details: cues for posture and pace Gait Pattern: Trunk flexed;Step-through pattern;Wide base of support Stairs: Yes Stairs Assistance: 4: Min guard Stair Management Technique: No rails;Forwards;Step to pattern;Alternating pattern Number of Stairs: 4    Exercises     PT Diagnosis:    PT Problem List:   PT Treatment  Interventions:     PT Goals (current goals can now be found in the care plan section) Acute Rehab PT Goals PT Goal Formulation: With patient Time For Goal Achievement: 11/30/12 Potential to Achieve Goals: Good  Visit Information  Last PT Received On: 11/27/12 Assistance Needed: +1 History of Present Illness: CENTRAL DECOMPRESSION LUMBAR LAMINECTOMY L3-L4,  L4-L5     Subjective Data      Cognition  Cognition Arousal/Alertness: Awake/alert Behavior During Therapy: WFL for tasks assessed/performed Overall Cognitive Status: Within Functional Limits for tasks assessed    Balance     End of Session PT - End of Session Activity Tolerance: Patient tolerated treatment well Patient left: in bed;with call bell/phone within reach;with family/visitor present Nurse Communication: Mobility status   GP     Ian Gonzales 11/27/2012, 4:21 PM

## 2012-11-27 NOTE — Op Note (Signed)
NAMEGIULIO, Ian Gonzales NO.:  [id number]  MEDICAL RECORD NO.:  [id number]  LOCATION:  1606                         FACILITY:  [facility]  PHYSICIAN:  Georges Lynch. Kobi Mario, M.D.DATE OF BIRTH:  1941-01-15  DATE OF PROCEDURE:  11/26/2012 DATE OF DISCHARGE:  11/26/2012                              OPERATIVE REPORT   CHIEF PROBLEM:  Severe back pain radiating down his left lower extremity and myelogram showed a complete block at L3-4 and L4-5.  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  He had some weakness of the dorsiflexors of the left foot.  POSTOPERATIVE DIAGNOSIS:  Severe spinal stenosis at L3-4 and L4-5 with left sciatica. He had some weakness of the dorsiflexors of the left foot.  OPERATION: 1. Complete decompressive lumbar laminectomy at L3-L4. 2. Complete decompressive lumbar laminectomy at L4-L5. 3. Foraminotomies bilaterally for the L4 and the L5 root.  SURGEON:  Georges Lynch. Darrelyn Hillock, M.D.  ASSISTANT:  Marlowe Kays, M.D.  PROCEDURE IN DETAIL:  Under general anesthesia, the patient on a spinal frame where routine time-out was carried out.  I also marked the appropriate left side of his back in the holding area.  After sterile prep and draping and after the time-out 2 needles were placed in the back for localization purposes.  X-ray was taken.  Incision then was made over the L3-4, L4-5 spaces.  Bleeders were identified.  The muscle then was stripped from the lamina and spinous processes bilaterally. Bleeders were identified and cauterized.  The microscope was brought in after we removed the spinous processes of L3-4 and L4-5.  Following that, we then began our decompression.  We did completely decompressed the central portion of the spinal canal.  We went out laterally, bilaterally, and into the foramina.  The dura was severely adhered at the L4-5 space.  We gently teased off the thickened ligamentum flavum. We  also noted the area where he had previous epidural.  Once we had dura completely decompressed by the way we did use the microscope to do this. We were able to easily pass a hockey-stick proximally and distally.  We went up far proximally and far distally until we had an opening in the canal that we were happy with.  The dura was extremely tight as I mentioned at L3-4 and L4-L5, and greater than L4-5.  After the procedure was completed, we thoroughly irrigated out the area, loosely applied some thrombin-soaked Gelfoam.  The wound was closed in layers in usual fashion.  I closed the deep distal part.  I closed the deep part of the wound by leaving a part of the proximal and distal parts open for drainage purposes.  Subcu was closed with a 1 Vicryl.  The skin with metal staples.  Note, at the beginning of the procedure, I injected 20 mL of 0.25% Marcaine with epinephrine and soft tissue.  At the end of the procedure, I injected 20 mL of Exparel.  Sterile Neosporin dressings were applied.  He had 2 g of IV Ancef.          ______________________________ Georges Lynch. Darrelyn Hillock, M.D.     RAG/MEDQ  D:  11/26/2012  T:  11/27/2012  Job:  [id number]

## 2012-11-28 MED ORDER — POLYETHYLENE GLYCOL 3350 17 G PO PACK: 17.0000 g | PACK | Freq: Every day | ORAL | Status: DC | PRN

## 2012-11-28 MED ORDER — METHOCARBAMOL 500 MG PO TABS: 500.0000 mg | ORAL_TABLET | Freq: Four times a day (QID) | ORAL | Status: DC | PRN

## 2012-11-28 MED ORDER — OXYCODONE HCL 5 MG PO TABS: 5.0000 mg | ORAL_TABLET | ORAL | Status: DC | PRN

## 2012-11-28 NOTE — Progress Notes (Signed)
D/C instructions reviewed w/ pt and SO. All questions answered, no further questions. Pt d/c in w/c in stable condition by NT. Pt in possession of d/c instructions, scripts, and all personal belongings.

## 2012-11-28 NOTE — Progress Notes (Signed)
Occupational Therapy Treatment and discharge from OT Patient Details Name: Ian Gonzales MRN: [id number] DOB: 08-02-1940 Today's Date: 11/28/2012 Time: 4098-1191 OT Time Calculation (min): 32 min  OT Assessment / Plan / Recommendation  History of present illness CENTRAL DECOMPRESSION LUMBAR LAMINECTOMY L3-L4,  L4-L5    OT comments  Goals met in acute setting  Follow Up Recommendations  No OT follow up;Supervision - Intermittent    Barriers to Discharge       Equipment Recommendations  None recommended by OT    Recommendations for Other Services    Frequency     Progress towards OT Goals Progress towards OT goals: Goals met/education completed, patient discharged from OT  Plan      Precautions / Restrictions Precautions Precautions: Back Restrictions Weight Bearing Restrictions: No   Pertinent Vitals/Pain Sore back.  repositioned    ADL  Upper Body Dressing: Supervision/safety (to gather items) Where Assessed - Upper Body Dressing: Unsupported sitting Lower Body Dressing: Supervision/safety Where Assessed - Lower Body Dressing: Supported sit to stand Toilet Transfer: Supervision/safety Statistician Method: Sit to Barista: Comfort height toilet Toileting - Clothing Manipulation and Hygiene: Modified independent Where Assessed - Engineer, mining and Hygiene: Sit to stand from 3-in-1 or toilet Transfers/Ambulation Related to ADLs: ambulated in room to retrieve clothes and getting to bathroom.  Used RW in room then did not use it to walk to bathroom ADL Comments: able to recall back precautions and follow:  one cue when he leaned forward to place cup on table.  Pt crosses legs for LB dressing    OT Diagnosis:    OT Problem List:   OT Treatment Interventions:     OT Goals(current goals can now be found in the care plan section)    Visit Information  Last OT Received On: 11/28/12 Assistance Needed: +1 History of Present  Illness: CENTRAL DECOMPRESSION LUMBAR LAMINECTOMY L3-L4,  L4-L5     Subjective Data      Prior Functioning       Cognition  Cognition Behavior During Therapy: WFL for tasks assessed/performed Overall Cognitive Status: Within Functional Limits for tasks assessed    Mobility  Transfers Sit to Stand: 5: Supervision;From chair/3-in-1;From toilet Details for Transfer Assistance: pt has flexed posture but keeps back as straight as possible during sit to stand    Exercises      Balance     End of Session OT - End of Session Activity Tolerance: Patient tolerated treatment well Patient left: in chair;with call bell/phone within reach  GO     [facility] 11/28/2012, 9:26 AM Marica Otter, OTR/L [phone] 11/28/2012

## 2012-11-28 NOTE — Progress Notes (Signed)
   Subjective: 2 Days Post-Op Procedure(s) (LRB): CENTRAL DECOMPRESSION LUMBAR LAMINECTOMY L3-L4,  L4-L5    (N/A) Patient reports pain as mild.   Patient seen in rounds with Dr. Lequita Halt. Patient says that he feels "lowsy" this morning. He says that he is having some soreness in his back but no leg pain. He says that he is ready to get out of the hospital. No issues overnight. No SOB or chest pain. Positive flatus.  Plan is to go Home after hospital stay.  Objective: Vital signs in last 24 hours: Temp:  [98.3 F (36.8 C)-99 F (37.2 C)] 98.8 F (37.1 C) (10/19 0535) Pulse Rate:  [77-87] 81 (10/19 0535) Resp:  [16] 16 (10/19 0535) BP: (119-133)/(70-73) 133/71 mmHg (10/19 0535) SpO2:  [95 %-97 %] 95 % (10/19 0535)  Intake/Output from previous day:  Intake/Output Summary (Last 24 hours) at 11/28/12 0710 Last data filed at 11/28/12 0535  Gross per 24 hour  Intake   1030 ml  Output   1250 ml  Net   -220 ml    Intake/Output this shift:    Labs: No results found for this basename: HGB,  in the last 72 hours No results found for this basename: WBC, RBC, HCT, PLT,  in the last 72 hours No results found for this basename: NA, K, CL, CO2, BUN, CREATININE, GLUCOSE, CALCIUM,  in the last 72 hours No results found for this basename: LABPT, INR,  in the last 72 hours  EXAM General - Patient is Alert and Oriented Extremity - Neurologically intact Dorsiflexion/Plantar flexion intact Incision: no drainage Dressing/Incision - clean, dry, no drainage Motor Function - intact, moving foot and toes well on exam.   Past Medical History  Diagnosis Date  . Hypertension   . GERD (gastroesophageal reflux disease)   . Colon polyps   . Back pain, lumbosacral   . Sinusitis acute 03/24/2011  . Memory loss 04/16/2011  . Allergic state 05/30/2011  . Leukocytosis 12/15/2011  . Hyperlipidemia 02/15/2012  . Unspecified sinusitis (chronic) 04/05/2012  . Epicondylitis 10/10/2012  . Shortness of breath    WITH EXERTION AND BACK PAIN  . Wound drainage     L ELBOW  . Spinal stenosis, lumbar   . Nocturia     Assessment/Plan: 2 Days Post-Op Procedure(s) (LRB): CENTRAL DECOMPRESSION LUMBAR LAMINECTOMY L3-L4,  L4-L5    (N/A) Active Problems:   Spinal stenosis, lumbar region, with neurogenic claudication  Estimated body mass index is 27.29 kg/(m^2) as calculated from the following:   Height as of this encounter: 5' 9.5" (1.765 m).   Weight as of this encounter: 85.021 kg (187 lb 7 oz). Advance diet Up with therapy D/C IV fluids Discharge home   DVT Prophylaxis - Aspirin Weight-Bearing as tolerated   Doing well this morning. He is ready to go home. Will work with therapy this morning. Discharge instructions given.   Anniebelle Devore LAUREN 11/28/2012, 7:10 AM

## 2012-11-28 NOTE — Progress Notes (Signed)
Physical Therapy Treatment Patient Details Name: Ian Gonzales MRN: [id number] DOB: Apr 02, 1940 Today's Date: 11/28/2012 Time: 4098-1191 PT Time Calculation (min): 24 min  PT Assessment / Plan / Recommendation  History of Present Illness CENTRAL DECOMPRESSION LUMBAR LAMINECTOMY L3-L4,  L4-L5    PT Comments   Pt plans to D/C to home today.  Reviewed back precautions as well as given handout.  Amb pt in hallway then performed steps.  Pt instructed on safety as he cont to be impulsive.  Increased c/o "soreness" today so amb pt with RW.  Pt states he has one at home if needed.   Follow Up Recommendations  No PT follow up     Does the patient have the potential to tolerate intense rehabilitation     Barriers to Discharge        Equipment Recommendations  None recommended by PT    Recommendations for Other Services    Frequency 7X/week   Progress towards PT Goals Progress towards PT goals: Progressing toward goals  Plan      Precautions / Restrictions Precautions Precautions: Back Precaution Comments: All precautions reviewed and handout given Restrictions Weight Bearing Restrictions: No    Pertinent Vitals/Pain C/o 8/10 back pain today despite premedication    Mobility  Bed Mobility Bed Mobility: Supine to Sit Supine to Sit: 5: Supervision Details for Bed Mobility Assistance: cues for log roll technique and increased time Transfers Transfers: Sit to Stand;Stand to Sit Sit to Stand: 5: Supervision;From bed Stand to Sit: 5: Supervision;With upper extremity assist;To chair/3-in-1 Details for Transfer Assistance: increased time Ambulation/Gait Ambulation/Gait Assistance: 4: Min guard;5: Supervision Ambulation Distance (Feet): 700 Feet Assistive device: Rolling walker Ambulation/Gait Assistance Details: used RW this session due to increased c/o back pain and noted gait instability.  Gait Pattern: Trunk flexed;Step-through pattern;Wide base of support Stairs:  Yes Stairs Assistance: 4: Min guard Stair Management Technique: No rails;Forwards;Step to pattern;Alternating pattern     PT Goals (current goals can now be found in the care plan section)    Visit Information  Last PT Received On: 11/28/12 Assistance Needed: +1 History of Present Illness: CENTRAL DECOMPRESSION LUMBAR LAMINECTOMY L3-L4,  L4-L5     Subjective Data      Cognition  Cognition Behavior During Therapy: WFL for tasks assessed/performed Overall Cognitive Status: Within Functional Limits for tasks assessed    Balance     End of Session PT - End of Session Activity Tolerance: Patient tolerated treatment well Patient left: in chair;with call bell/phone within reach   Felecia Shelling  PTA [facility]  Acute  Rehab Pager      [phone]

## 2012-11-29 ENCOUNTER — Encounter (HOSPITAL_COMMUNITY): Payer: Self-pay | Admitting: Orthopedic Surgery

## 2012-11-29 NOTE — Discharge Summary (Signed)
Physician Discharge Summary   Patient ID: Ian Gonzales MRN: [id number] DOB/AGE: 10/09/1940 72 y.o.  Admit date: 11/26/2012 Discharge date: 11/28/2012  Primary Diagnosis: Lumbar spinal stenosis  Admission Diagnoses:  Past Medical History  Diagnosis Date  . Hypertension   . GERD (gastroesophageal reflux disease)   . Colon polyps   . Back pain, lumbosacral   . Sinusitis acute 03/24/2011  . Memory loss 04/16/2011  . Allergic state 05/30/2011  . Leukocytosis 12/15/2011  . Hyperlipidemia 02/15/2012  . Unspecified sinusitis (chronic) 04/05/2012  . Epicondylitis 10/10/2012  . Shortness of breath     WITH EXERTION AND BACK PAIN  . Wound drainage     L ELBOW  . Spinal stenosis, lumbar   . Nocturia    Discharge Diagnoses:   Active Problems:   Spinal stenosis, lumbar region, with neurogenic claudication  Estimated body mass index is 27.29 kg/(m^2) as calculated from the following:   Height as of this encounter: 5' 9.5" (1.765 m).   Weight as of this encounter: 85.021 kg (187 lb 7 oz).  Procedure:  Procedure(s) (LRB): CENTRAL DECOMPRESSION LUMBAR LAMINECTOMY L3-L4,  L4-L5    (N/A)   Consults: None  HPI: Ian Gonzales presented with the chief complaint of back pain x 4 months without any injury. Symptoms reported include: pain, pain with weightbearing, weakness, numbness, burning, leg pain and foot pain. The patient states that they are doing poorly. Current treatment includes: NSAIDs and pain medications. The following medication has been used for pain control: antiinflammatory medication, Tylenol and Ultram. The patient reports their current pain level to be moderate to severe. He went to see his PCP for back pain and was sent for xrays. He said he has scoliosis and arthritis. He states he then had shingles in the left groin a couple months ago. He states that he was having more left leg pain but now has bilateral leg pain and numbness and tingling in both feet. He has had back surgery in the  70's for a herniated disc. CT myelogram of the lumbar spine showed a near complete block at L3-L4, partial at L4-L5, partial at L2-L3.   Laboratory Data: Hospital Outpatient Visit on 11/23/2012  Component Date Value Range Status  . WBC 11/23/2012 9.3  4.0 - 10.5 K/uL Final  . RBC 11/23/2012 4.56  4.22 - 5.81 MIL/uL Final  . Hemoglobin 11/23/2012 14.1  13.0 - 17.0 g/dL Final  . HCT 40/98/1191 41.5  39.0 - 52.0 % Final  . MCV 11/23/2012 91.0  78.0 - 100.0 fL Final  . MCH 11/23/2012 30.9  26.0 - 34.0 pg Final  . MCHC 11/23/2012 34.0  30.0 - 36.0 g/dL Final  . RDW 47/82/9562 14.6  11.5 - 15.5 % Final  . Platelets 11/23/2012 324  150 - 400 K/uL Final  . MRSA, PCR 11/23/2012 NEGATIVE  NEGATIVE Final  . Staphylococcus aureus 11/23/2012 NEGATIVE  NEGATIVE Final   Comment:                                 The Xpert SA Assay (FDA                          approved for NASAL specimens                          in patients over 45 years of age),  is one component of                          a comprehensive surveillance                          program.  Test performance has                          been validated by [facility] for patients greater                          than or equal to 17 year old.                          It is not intended                          to diagnose infection nor to                          guide or monitor treatment.  Marland Kitchen aPTT 11/23/2012 37  24 - 37 seconds Final   Comment:                                 IF BASELINE aPTT IS ELEVATED,                          SUGGEST PATIENT RISK ASSESSMENT                          BE USED TO DETERMINE APPROPRIATE                          ANTICOAGULANT THERAPY.  . Sodium 11/23/2012 138  135 - 145 mEq/L Final  . Potassium 11/23/2012 4.2  3.5 - 5.1 mEq/L Final  . Chloride 11/23/2012 104  96 - 112 mEq/L Final  . CO2 11/23/2012 24  19 - 32 mEq/L Final  . Glucose, Bld 11/23/2012 96   70 - 99 mg/dL Final  . BUN 84/69/6295 18  6 - 23 mg/dL Final  . Creatinine, Ser 11/23/2012 0.91  0.50 - 1.35 mg/dL Final  . Calcium 28/41/3244 10.0  8.4 - 10.5 mg/dL Final  . Total Protein 11/23/2012 7.3  6.0 - 8.3 g/dL Final  . Albumin 02/12/7251 4.1  3.5 - 5.2 g/dL Final  . AST 66/44/0347 13  0 - 37 U/L Final  . ALT 11/23/2012 12  0 - 53 U/L Final  . Alkaline Phosphatase 11/23/2012 95  39 - 117 U/L Final  . Total Bilirubin 11/23/2012 0.3  0.3 - 1.2 mg/dL Final  . GFR calc non Af Amer 11/23/2012 81* >90 mL/min Final  . GFR calc Af Amer 11/23/2012 >90  >90 mL/min Final   Comment: (NOTE)                          The eGFR has been calculated using the CKD EPI equation.  This calculation has not been validated in all clinical situations.                          eGFR's persistently <90 mL/min signify possible Chronic Kidney                          Disease.  Marland Kitchen Prothrombin Time 11/23/2012 13.4  11.6 - 15.2 seconds Final  . INR 11/23/2012 1.04  0.00 - 1.49 Final  . Color, Urine 11/23/2012 YELLOW  YELLOW Final  . APPearance 11/23/2012 CLEAR  CLEAR Final  . Specific Gravity, Urine 11/23/2012 1.030  1.005 - 1.030 Final  . pH 11/23/2012 6.0  5.0 - 8.0 Final  . Glucose, UA 11/23/2012 NEGATIVE  NEGATIVE mg/dL Final  . Hgb urine dipstick 11/23/2012 NEGATIVE  NEGATIVE Final  . Bilirubin Urine 11/23/2012 NEGATIVE  NEGATIVE Final  . Ketones, ur 11/23/2012 NEGATIVE  NEGATIVE mg/dL Final  . Protein, ur 14/78/2956 NEGATIVE  NEGATIVE mg/dL Final  . Urobilinogen, UA 11/23/2012 1.0  0.0 - 1.0 mg/dL Final  . Nitrite 21/30/8657 NEGATIVE  NEGATIVE Final  . Leukocytes, UA 11/23/2012 NEGATIVE  NEGATIVE Final   MICROSCOPIC NOT DONE ON URINES WITH NEGATIVE PROTEIN, BLOOD, LEUKOCYTES, NITRITE, OR GLUCOSE <1000 mg/dL.     X-Rays:Dg Chest 2 View  11/23/2012   CLINICAL DATA:  Preop for low back surgery, cough, smoking history  EXAM: CHEST  2 VIEW  COMPARISON:  Chest x-ray of 07/30/2011   FINDINGS: No active infiltrate or effusion is seen. The lungs remain hyperaerated consistent with emphysema. Mediastinal contours are stable. Mild apical pleural parenchymal scarring is stable. The heart is within upper limits normal in size. No bony abnormality is seen with degenerative change throughout the mid to lower thoracic spine.  IMPRESSION: Stable chest x-ray. No active lung disease.   Electronically Signed   By: Dwyane Dee M.D.   On: 11/23/2012 15:33   Dg Lumbar Spine 2-3 Views  11/23/2012   CLINICAL DATA:  Preop for low back surgery.  EXAM: LUMBAR SPINE - 2-3 VIEW  COMPARISON:  CT myelogram 11/01/2012 and lumbar spine radiographs 07/01/2012  FINDINGS: Two views of the lumbar spine were obtained. There is marked disc space narrowing at L2-L3 and L4-5. There appears to be transitional anatomy at L5. Degenerative facet disease in the lower lumbar spine. Alignment of the lumbar spine is unchanged. Abdominal aorta is heavily calcified. There is deformity of the L4 superior endplate but this is unchanged. Again noted is mild scoliosis in the lumbar spine.  IMPRESSION: Multilevel degenerate disc and facet disease in the lumbar spine. Findings are similar to the previous examinations.   Electronically Signed   By: Richarda Overlie M.D.   On: 11/23/2012 15:35   Ct Lumbar Spine W Contrast  11/01/2012   CLINICAL DATA:  Low back pain. Bilateral lower extremity pain and, left greater than right.  TECHNIQUE: Contiguous axial images were obtained through the lumbar spine without infusion. Coronal, sagittal, and disc space reconstructions were obtained of the axial image sets.  FLUOROSCOPY TIME:  41 seconds  PROCEDURE: LUMBAR MYELOGRAM  Procedure: After thorough discussion of risks and benefits of the procedure including bleeding, infection, injury to nerves, blood vessels, adjacent structures as well as headache and CSF leak, written and oral informed consent was obtained. Consent was obtained by Dr. Gennette Pac.  Time out form was completed.  Patient was positioned prone on the fluoroscopy table. Local  anesthesia was provided with 1% lidocaine without epinephrine after prepped and draped in the usual sterile fashion. Puncture was performed at L2-3 using a 22-gauge spinal needle via right para midline approach. Using a single pass through the dura, the needle was placed within the thecal sac, with return of clear CSF. 15 mL of Omnipaque-180 was injected into the thecal sac, with normal opacification of the nerve roots and cauda equina consistent with free flow within the subarachnoid space.  I personally performed the lumbar puncture and administered the intrathecal contrast. I also personally supervised acquisition of the myelogram images.  FINDINGS: LUMBAR MYELOGRAM FINDINGS:  Transitional anatomy is present with sacralization of the L5 segment. Mild lateral recess narrowing at L1-2 is worse on the left. Rightward curvature of the lumbar spine is centered at L2-3 mild to moderate central canal stenosis is present at L2-3 with left greater than right lateral recess narrowing. Severe central canal narrowing is present at L3-4. There is mild retrolisthesis at L1-2 and L2-3. Severe loss of disc height is present at L4-5 with a vacuum disc. There is severe stenosis at this level as well. There is no significant stenosis at the L5-S1 level.  Alignment is stable upon standing. There is no abnormal motion through a limited range of flexion and extension.  Atherosclerotic calcifications are present within the abdominal aorta without evidence for aneurysm.  CT LUMBAR MYELOGRAM FINDINGS:  Did the lumbar spine is imaged from did the midbody at T11 through S2 3. Transitional L5 anatomy is again noted. Did did mild rightward curvature of the lumbar spine is centered at L2-3. There is chronic loss of height to lung and inferior endplate of L4. A vacuum disc is present at L4-5. Atherosclerotic calcifications are noted in the abdominal  aorta without visible aneurysm. Degenerative changes are present at the SI joints bilaterally.  T11-12: Mild disc bulge is present. There is no significant stenosis.  T12-L1: Mild facet hypertrophy is present. A far left lateral disc protrusion results in mild left foraminal narrowing.  L1-2: A posterior osteophyte results in moderate left lateral recess narrowing. Facet hypertrophy and ligamentum flavum thickening contribute. Moderate foraminal stenosis is worse on the left.  L2-3: A broad-based disc herniation is asymmetric to the left. Severe left and moderate right facet hypertrophy is present. This results in moderate to severe left lateral recess and mild right lateral recess narrowing. Moderate to severe left and mild right foraminal stenosis is present.  L3-4: A broad-based disc herniation is present. Severe facet hypertrophy and ligamentum flavum thickening is present. This results in severe central canal stenosis or lateral recess narrowing bilaterally. Severe left and moderate right foraminal narrowing is present.  L4-5: A broad-based calcified disc is present. Moderate facet hypertrophy is evident. This results in moderate lateral recess narrowing bilaterally, right greater than left. Moderate osseous foraminal stenosis is present bilaterally.  L5-S1: Transitional anatomy is evident. Endplate spurring is present on the right. This results in moderate right foraminal narrowing and. The left foramen and the central canal are patent.  IMPRESSION: LUMBAR MYELOGRAM IMPRESSION:  1. Transitional anatomy at L5 as described. 2. Severe central canal stenosis at L3-4 and L4-5. 3. Retrolisthesis and broad-based disc herniation at L2-3 leads to mild to moderate lateral recess narrowing, left greater than right. 4. Lateral recess narrowing at L1-2 is worse on the left  CT LUMBAR MYELOGRAM IMPRESSION:  1. 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 2. Mild left foraminal narrowing at T12-L1.  3. Moderate  left lateral recess and foraminal stenosis at L1-2. 4. Moderate to severe left and mild right lateral recess and foraminal stenosis at L2-3. 5. Severe central canal stenosis at L3-4. This is the worst level. 6. Severe left and moderate right foraminal stenosis at L3-4. 7. Moderate lateral recess and foraminal narrowing at L4-5 is worse on the right. 8. Moderate right foraminal stenosis at L5-S1.   Electronically Signed   By: Gennette Pac   On: 11/01/2012 16:00   Dg Myelogram Lumbar  11/01/2012   CLINICAL DATA:  Low back pain. Bilateral lower extremity pain and, left greater than right.  TECHNIQUE: Contiguous axial images were obtained through the lumbar spine without infusion. Coronal, sagittal, and disc space reconstructions were obtained of the axial image sets.  FLUOROSCOPY TIME:  41 seconds  PROCEDURE: LUMBAR MYELOGRAM  Procedure: After thorough discussion of risks and benefits of the procedure including bleeding, infection, injury to nerves, blood vessels, adjacent structures as well as headache and CSF leak, written and oral informed consent was obtained. Consent was obtained by Dr. Gennette Pac. Time out form was completed.  Patient was positioned prone on the fluoroscopy table. Local anesthesia was provided with 1% lidocaine without epinephrine after prepped and draped in the usual sterile fashion. Puncture was performed at L2-3 using a 22-gauge spinal needle via right para midline approach. Using a single pass through the dura, the needle was placed within the thecal sac, with return of clear CSF. 15 mL of Omnipaque-180 was injected into the thecal sac, with normal opacification of the nerve roots and cauda equina consistent with free flow within the subarachnoid space.  I personally performed the lumbar puncture and administered the intrathecal contrast. I also personally supervised acquisition of the myelogram images.  FINDINGS: LUMBAR MYELOGRAM FINDINGS:  Transitional anatomy is present  with sacralization of the L5 segment. Mild lateral recess narrowing at L1-2 is worse on the left. Rightward curvature of the lumbar spine is centered at L2-3 mild to moderate central canal stenosis is present at L2-3 with left greater than right lateral recess narrowing. Severe central canal narrowing is present at L3-4. There is mild retrolisthesis at L1-2 and L2-3. Severe loss of disc height is present at L4-5 with a vacuum disc. There is severe stenosis at this level as well. There is no significant stenosis at the L5-S1 level.  Alignment is stable upon standing. There is no abnormal motion through a limited range of flexion and extension.  Atherosclerotic calcifications are present within the abdominal aorta without evidence for aneurysm.  CT LUMBAR MYELOGRAM FINDINGS:  Did the lumbar spine is imaged from did the midbody at T11 through S2 3. Transitional L5 anatomy is again noted. Did did mild rightward curvature of the lumbar spine is centered at L2-3. There is chronic loss of height to lung and inferior endplate of L4. A vacuum disc is present at L4-5. Atherosclerotic calcifications are noted in the abdominal aorta without visible aneurysm. Degenerative changes are present at the SI joints bilaterally.  T11-12: Mild disc bulge is present. There is no significant stenosis.  T12-L1: Mild facet hypertrophy is present. A far left lateral disc protrusion results in mild left foraminal narrowing.  L1-2: A posterior osteophyte results in moderate left lateral recess narrowing. Facet hypertrophy and ligamentum flavum thickening contribute. Moderate foraminal stenosis is worse on the left.  L2-3: A broad-based disc herniation is asymmetric to the left. Severe left and moderate right facet hypertrophy is present. This results in moderate to severe left  lateral recess and mild right lateral recess narrowing. Moderate to severe left and mild right foraminal stenosis is present.  L3-4: A broad-based disc herniation is  present. Severe facet hypertrophy and ligamentum flavum thickening is present. This results in severe central canal stenosis or lateral recess narrowing bilaterally. Severe left and moderate right foraminal narrowing is present.  L4-5: A broad-based calcified disc is present. Moderate facet hypertrophy is evident. This results in moderate lateral recess narrowing bilaterally, right greater than left. Moderate osseous foraminal stenosis is present bilaterally.  L5-S1: Transitional anatomy is evident. Endplate spurring is present on the right. This results in moderate right foraminal narrowing and. The left foramen and the central canal are patent.  IMPRESSION: LUMBAR MYELOGRAM IMPRESSION:  1. Transitional anatomy at L5 as described. 2. Severe central canal stenosis at L3-4 and L4-5. 3. Retrolisthesis and broad-based disc herniation at L2-3 leads to mild to moderate lateral recess narrowing, left greater than right. 4. Lateral recess narrowing at L1-2 is worse on the left  CT LUMBAR MYELOGRAM IMPRESSION:  1. 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 2. Mild left foraminal narrowing at T12-L1. 3. Moderate left lateral recess and foraminal stenosis at L1-2. 4. Moderate to severe left and mild right lateral recess and foraminal stenosis at L2-3. 5. Severe central canal stenosis at L3-4. This is the worst level. 6. Severe left and moderate right foraminal stenosis at L3-4. 7. Moderate lateral recess and foraminal narrowing at L4-5 is worse on the right. 8. Moderate right foraminal stenosis at L5-S1.   Electronically Signed   By: Gennette Pac   On: 11/01/2012 16:00   Dg Spine Portable 1 View  11/26/2012   CLINICAL DATA:  Surgical level L3-4, L4-5  EXAM: PORTABLE SPINE - 1 VIEW  COMPARISON:  11/26/2012  FINDINGS: Posterior surgical instruments are noted at the L3-4 and L4-5 levels.  IMPRESSION: Intraoperative localization as above.   Electronically Signed   By: Charlett Nose M.D.   On:  11/26/2012 17:11   Dg Spine Portable 1 View  11/26/2012   CLINICAL DATA:  Intraoperative localization.  EXAM: PORTABLE SPINE - 1 VIEW  COMPARISON:  Earlier film, same date.  FINDINGS: There are surgical clamps on the L3 and L4 spinous processes and a surgical instrument marking the L3-4 disc space level.  IMPRESSION: L3-4 marked intraoperatively.   Electronically Signed   By: Loralie Champagne M.D.   On: 11/26/2012 16:05   Dg Spine Portable 1 View  11/26/2012   CLINICAL DATA:  Intraoperative localization for spine surgery.  EXAM: PORTABLE SPINE - 1 VIEW  COMPARISON:  Radiograph 11/23/2012.  FINDINGS: The spinal needles are localizing the L4 and L3 spinous processes.  IMPRESSION: Localization of the L3 and L4 spinous processes.   Electronically Signed   By: Loralie Champagne M.D.   On: 11/26/2012 15:47    EKG: Orders placed during the hospital encounter of 11/23/12  . EKG 12-LEAD  . EKG 12-LEAD     Hospital Course: CESARE SUMLIN is a 72 y.o. who was admitted to [facility]. They were brought to the operating room on 11/26/2012 and underwent Procedure(s): CENTRAL DECOMPRESSION LUMBAR LAMINECTOMY L3-L4,  L4-L5   .  Patient tolerated the procedure well and was later transferred to the recovery room and then to the orthopaedic floor for postoperative care.  They were given PO and IV analgesics for pain control following their surgery.  They were given 24 hours of postoperative antibiotics of  Anti-infectives  Start     Dose/Rate Route Frequency Ordered Stop   11/26/12 2200  ceFAZolin (ANCEF) IVPB 1 g/50 mL premix     1 g 100 mL/hr over 30 Minutes Intravenous 3 times per day 11/26/12 1909 11/27/12 1513   11/26/12 1554  polymyxin B 500,000 Units, bacitracin 50,000 Units in sodium chloride irrigation 0.9 % 500 mL irrigation  Status:  Discontinued       As needed 11/26/12 1555 11/26/12 1742   11/26/12 1453  polymyxin B 500,000 Units, bacitracin 50,000 Units in sodium chloride irrigation  0.9 % 500 mL irrigation  Status:  Discontinued       As needed 11/26/12 1453 11/26/12 1742   11/26/12 1131  ceFAZolin (ANCEF) IVPB 2 g/50 mL premix     2 g 100 mL/hr over 30 Minutes Intravenous On call to O.R. 11/26/12 1131 11/26/12 1458     and started on DVT prophylaxis in the form of Aspirin.   PT was ordered.  Discharge planning consulted to help with postop disposition and equipment needs.  Patient had a decent night on the evening of surgery, although he had some issues with a sore throat.  They started to get up OOB with therapy on day one. Continued to work with therapy into day two.  Dressing was changed on day two and the incision was clean and dry.  Incision was healing well.  Patient was seen in rounds and was ready to go home.   Discharge Medications: Prior to Admission medications   Medication Sig Start Date End Date Taking? Authorizing Provider  acetaminophen (TYLENOL) 325 MG tablet Take 650 mg by mouth every morning.   Yes Historical Provider, MD  amLODipine (NORVASC) 5 MG tablet Take 5 mg by mouth every morning.   Yes Historical Provider, MD  aspirin EC 325 MG tablet Take 81 mg by mouth every morning.  10/04/12  Yes Bradd Canary, MD  ranitidine (ZANTAC) 300 MG tablet take 1 tablet by mouth at bedtime 08/13/12  Yes Bradd Canary, MD  albuterol (PROVENTIL HFA;VENTOLIN HFA) 108 (90 BASE) MCG/ACT inhaler Inhale 2 puffs into the lungs every 6 (six) hours as needed for wheezing or shortness of breath. 10/31/11   Bradd Canary, MD  fluticasone (FLONASE) 50 MCG/ACT nasal spray Place 2 sprays into the nose daily as needed for allergies.    Historical Provider, MD  methocarbamol (ROBAXIN) 500 MG tablet Take 1 tablet (500 mg total) by mouth every 6 (six) hours as needed. 11/28/12   Dessa Ledee Tamala Ser, PA-C  naproxen sodium (ALEVE) 220 MG tablet Take 440 mg by mouth every morning.    Historical Provider, MD  oxyCODONE (ROXICODONE) 5 MG immediate release tablet Take 1-2 tablets (5-10 mg  total) by mouth every 4 (four) hours as needed for pain. 11/28/12   Nazareth Norenberg Tamala Ser, PA-C  polyethylene glycol (MIRALAX / GLYCOLAX) packet Take 17 g by mouth daily as needed. 11/28/12   Chelsea Pedretti Tamala Ser, PA-C  traMADol Janean Sark) 50 MG tablet take 1 tablet by mouth every 8 hours if needed for pain 09/15/12   Bradd Canary, MD    Diet: Regular diet Activity:WBAT Follow-up:in 2 weeks Disposition - Home Discharged Condition: good   Discharge Orders   Future Appointments Provider Department Dept Phone   12/10/2012 3:30 PM Bradd Canary, MD  HealthCare at  [facility] [phone]   Future Orders Complete By Expires   Call MD / Call 911  As directed    Comments:  If you experience chest pain or shortness of breath, CALL 911 and be transported to the hospital emergency room.  If you develope a fever above 101 F, pus (white drainage) or increased drainage or redness at the wound, or calf pain, call your surgeon's office.   Constipation Prevention  As directed    Comments:     Drink plenty of fluids.  Prune juice may be helpful.  You may use a stool softener, such as Colace (over the counter) 100 mg twice a day.  Use MiraLax (over the counter) for constipation as needed.   Diet - low sodium heart healthy  As directed    Discharge instructions  As directed    Comments:     Change your dressing daily. Shower only, no tub bath. Shower starting Monday Remove dressing and apply saran wrap over wound while showering. Do this for three days Call if any temperatures greater than 101 or any wound complications: [phone] during the day and ask for Dr. Jeannetta Ellis nurse, [facility] [place].   Driving restrictions  As directed    Comments:     No driving for 2 weeks   Increase activity slowly as tolerated  As directed    Lifting restrictions  As directed    Comments:     No lifting       Medication List         acetaminophen 325 MG tablet  Commonly known as:  TYLENOL  Take 650  mg by mouth every morning.     albuterol 108 (90 BASE) MCG/ACT inhaler  Commonly known as:  PROVENTIL HFA;VENTOLIN HFA  Inhale 2 puffs into the lungs every 6 (six) hours as needed for wheezing or shortness of breath.     ALEVE 220 MG tablet  Generic drug:  naproxen sodium  Take 440 mg by mouth every morning.     amLODipine 5 MG tablet  Commonly known as:  NORVASC  Take 5 mg by mouth every morning.     aspirin EC 325 MG tablet  Take 81 mg by mouth every morning.     fluticasone 50 MCG/ACT nasal spray  Commonly known as:  FLONASE  Place 2 sprays into the nose daily as needed for allergies.     methocarbamol 500 MG tablet  Commonly known as:  ROBAXIN  Take 1 tablet (500 mg total) by mouth every 6 (six) hours as needed.     oxyCODONE 5 MG immediate release tablet  Commonly known as:  ROXICODONE  Take 1-2 tablets (5-10 mg total) by mouth every 4 (four) hours as needed for pain.     polyethylene glycol packet  Commonly known as:  MIRALAX / GLYCOLAX  Take 17 g by mouth daily as needed.     ranitidine 300 MG tablet  Commonly known as:  ZANTAC  take 1 tablet by mouth at bedtime     traMADol 50 MG tablet  Commonly known as:  ULTRAM  take 1 tablet by mouth every 8 hours if needed for pain           Follow-up Information   Follow up with GIOFFRE,RONALD A, MD. Schedule an appointment as soon as possible for a visit in 2 weeks.   Specialty:  Orthopedic Surgery   Contact information:   [address] [place] [address] [phone]       Signed: Dimitri Ped [facility] 11/29/2012, 9:03 AM

## 2012-12-10 ENCOUNTER — Ambulatory Visit: Payer: Medicare Other | Admitting: Family Medicine

## 2012-12-11 ENCOUNTER — Other Ambulatory Visit: Payer: Self-pay | Admitting: Family Medicine

## 2012-12-11 NOTE — Telephone Encounter (Signed)
Please advise refill? I don't see that we have gave pt this?

## 2012-12-11 NOTE — Telephone Encounter (Signed)
Please check with patient, he recently had a surgery maybe they started him on this

## 2012-12-11 NOTE — Telephone Encounter (Signed)
Please see below.

## 2012-12-14 ENCOUNTER — Other Ambulatory Visit: Payer: Self-pay | Admitting: Family Medicine

## 2012-12-20 ENCOUNTER — Telehealth: Payer: Self-pay | Admitting: Family Medicine

## 2012-12-20 ENCOUNTER — Encounter: Payer: Self-pay | Admitting: Family Medicine

## 2012-12-20 ENCOUNTER — Ambulatory Visit (INDEPENDENT_AMBULATORY_CARE_PROVIDER_SITE_OTHER): Payer: Medicare Other | Admitting: Family Medicine

## 2012-12-20 VITALS — BP 146/86 | HR 96 | Temp 98.4°F | Resp 16 | Ht 69.5 in | Wt 181.0 lb

## 2012-12-20 DIAGNOSIS — Z0000 Encounter for general adult medical examination without abnormal findings: Secondary | ICD-10-CM

## 2012-12-20 DIAGNOSIS — M533 Sacrococcygeal disorders, not elsewhere classified: Secondary | ICD-10-CM

## 2012-12-20 DIAGNOSIS — M545 Low back pain, unspecified: Secondary | ICD-10-CM

## 2012-12-20 DIAGNOSIS — J4489 Other specified chronic obstructive pulmonary disease: Secondary | ICD-10-CM

## 2012-12-20 DIAGNOSIS — J449 Chronic obstructive pulmonary disease, unspecified: Secondary | ICD-10-CM

## 2012-12-20 DIAGNOSIS — I10 Essential (primary) hypertension: Secondary | ICD-10-CM

## 2012-12-20 DIAGNOSIS — E785 Hyperlipidemia, unspecified: Secondary | ICD-10-CM

## 2012-12-20 NOTE — Telephone Encounter (Signed)
Lab order week of 04-11-2013 Labs prior or at next visit lipid, renal, cbc, tsh, hepatic

## 2012-12-20 NOTE — Patient Instructions (Signed)
Back Pain, Adult Low back pain is very common. About 1 in 5 people have back pain.The cause of low back pain is rarely dangerous. The pain often gets better over time.About half of people with a sudden onset of back pain feel better in just 2 weeks. About 8 in 10 people feel better by 6 weeks.  CAUSES Some common causes of back pain include:  Strain of the muscles or ligaments supporting the spine.  Wear and tear (degeneration) of the spinal discs.  Arthritis.  Direct injury to the back. DIAGNOSIS Most of the time, the direct cause of low back pain is not known.However, back pain can be treated effectively even when the exact cause of the pain is unknown.Answering your caregiver's questions about your overall health and symptoms is one of the most accurate ways to make sure the cause of your pain is not dangerous. If your caregiver needs more information, he or she may order lab work or imaging tests (X-rays or MRIs).However, even if imaging tests show changes in your back, this usually does not require surgery. HOME CARE INSTRUCTIONS For many people, back pain returns.Since low back pain is rarely dangerous, it is often a condition that people can learn to manageon their own.   Remain active. It is stressful on the back to sit or stand in one place. Do not sit, drive, or stand in one place for more than 30 minutes at a time. Take short walks on level surfaces as soon as pain allows.Try to increase the length of time you walk each day.  Do not stay in bed.Resting more than 1 or 2 days can delay your recovery.  Do not avoid exercise or work.Your body is made to move.It is not dangerous to be active, even though your back may hurt.Your back will likely heal faster if you return to being active before your pain is gone.  Pay attention to your body when you bend and lift. Many people have less discomfortwhen lifting if they bend their knees, keep the load close to their bodies,and  avoid twisting. Often, the most comfortable positions are those that put less stress on your recovering back.  Find a comfortable position to sleep. Use a firm mattress and lie on your side with your knees slightly bent. If you lie on your back, put a pillow under your knees.  Only take over-the-counter or prescription medicines as directed by your caregiver. Over-the-counter medicines to reduce pain and inflammation are often the most helpful.Your caregiver may prescribe muscle relaxant drugs.These medicines help dull your pain so you can more quickly return to your normal activities and healthy exercise.  Put ice on the injured area.  Put ice in a plastic bag.  Place a towel between your skin and the bag.  Leave the ice on for 15-20 minutes, 03-04 times a day for the first 2 to 3 days. After that, ice and heat may be alternated to reduce pain and spasms.  Ask your caregiver about trying back exercises and gentle massage. This may be of some benefit.  Avoid feeling anxious or stressed.Stress increases muscle tension and can worsen back pain.It is important to recognize when you are anxious or stressed and learn ways to manage it.Exercise is a great option. SEEK MEDICAL CARE IF:  You have pain that is not relieved with rest or medicine.  You have pain that does not improve in 1 week.  You have new symptoms.  You are generally not feeling well. SEEK   IMMEDIATE MEDICAL CARE IF:   You have pain that radiates from your back into your legs.  You develop new bowel or bladder control problems.  You have unusual weakness or numbness in your arms or legs.  You develop nausea or vomiting.  You develop abdominal pain.  You feel faint. Document Released: 01/27/2005 Document Revised: 07/29/2011 Document Reviewed: 06/17/2010 [employer] Patient Information 2014 [employer].  

## 2012-12-20 NOTE — Progress Notes (Signed)
Patient ID: Ian Gonzales, male   DOB: 09-02-1940, 72 y.o.   MRN: [id number] Ian Gonzales [id number] 06-09-1940 12/20/2012      Progress Note-Follow Up  Subjective  Chief Complaint  Chief Complaint  Patient presents with  . Follow-up    2-mth. [HTN; COPD; Sciatica]    HPI  Patient is 72 yo caucasian male in today for follow up. He has just undergone a spinal surgery in the lumbar region with good results. He has significantly less pain in his low back and legs since then. No fevers or chills. Less myalgias. Denies any trouble with recovery. No HA/cough/malaise. Unfortunately he continues to smoke but denies cp/palp/sob/gi or gu concerns.   Past Medical History  Diagnosis Date  . Hypertension   . GERD (gastroesophageal reflux disease)   . Colon polyps   . Back pain, lumbosacral   . Sinusitis acute 03/24/2011  . Memory loss 04/16/2011  . Allergic state 05/30/2011  . Leukocytosis 12/15/2011  . Hyperlipidemia 02/15/2012  . Unspecified sinusitis (chronic) 04/05/2012  . Epicondylitis 10/10/2012  . Shortness of breath     WITH EXERTION AND BACK PAIN  . Wound drainage     L ELBOW  . Spinal stenosis, lumbar   . Nocturia     Past Surgical History  Procedure Laterality Date  . Lumbar laminectomy  1974    twice  . Urethral stricture dilatation    . Colonoscopy    . Penile prosthesis implant    . Cataracts      REMOVED  . Decompressive lumbar laminectomy level 2 N/A 11/26/2012    Procedure: CENTRAL DECOMPRESSION LUMBAR LAMINECTOMY L3-L4,  L4-L5   ;  Surgeon: Jacki Cones, MD;  Location: [facility];  Service: Orthopedics;  Laterality: N/A;    History reviewed. No pertinent family history.  History   Social History  . Marital Status: Single    Spouse Name: N/A    Number of Children: N/A  . Years of Education: N/A   Occupational History  . Not on file.   Social History Main Topics  . Smoking status: Current Every Day Smoker -- 2.00 packs/day    Types: Cigarettes  .  Smokeless tobacco: Never Used  . Alcohol Use: Yes     Comment: once in a while   . Drug Use: No  . Sexual Activity: No   Other Topics Concern  . Not on file   Social History Narrative  . No narrative on file    Current Outpatient Prescriptions on File Prior to Visit  Medication Sig Dispense Refill  . acetaminophen (TYLENOL) 325 MG tablet Take 650 mg by mouth every morning.      Marland Kitchen amLODipine (NORVASC) 5 MG tablet take 1 tablet by mouth once daily for blood pressure  30 tablet  6  . fluticasone (FLONASE) 50 MCG/ACT nasal spray Place 2 sprays into the nose daily as needed for allergies.      . methocarbamol (ROBAXIN) 500 MG tablet Take 1 tablet (500 mg total) by mouth every 6 (six) hours as needed.  40 tablet  1  . naproxen sodium (ALEVE) 220 MG tablet Take 440 mg by mouth every morning.      Marland Kitchen PROAIR HFA 108 (90 BASE) MCG/ACT inhaler inhale 2 puffs by mouth every 6 hours if needed for wheezing or shortness of breath  8.5 g  5  . ranitidine (ZANTAC) 300 MG tablet take 1 tablet by mouth at bedtime  30 tablet  5  .  traMADol (ULTRAM) 50 MG tablet take 1 tablet by mouth every 8 hours if needed for pain  60 tablet  2   No current facility-administered medications on file prior to visit.    No Known Allergies  Review of Systems  Review of Systems  Constitutional: Negative for fever and malaise/fatigue.  HENT: Negative for congestion.   Eyes: Negative for discharge.  Respiratory: Negative for shortness of breath.   Cardiovascular: Negative for chest pain, palpitations and leg swelling.  Gastrointestinal: Negative for nausea, abdominal pain and diarrhea.  Genitourinary: Negative for dysuria.  Musculoskeletal: Positive for back pain, joint pain and myalgias. Negative for falls.       Left elbow pain, still draining  Skin: Negative for rash.  Neurological: Negative for loss of consciousness and headaches.  Endo/Heme/Allergies: Negative for polydipsia.  Psychiatric/Behavioral: Negative  for depression and suicidal ideas. The patient is not nervous/anxious and does not have insomnia.     Objective  BP 146/86  Pulse 96  Temp(Src) 98.4 F (36.9 C) (Oral)  Resp 16  Ht 5' 9.5" (1.765 m)  Wt 181 lb (82.101 kg)  BMI 26.35 kg/m2  SpO2 98%  Physical Exam  Physical Exam  Constitutional: He is oriented to person, place, and time and well-developed, well-nourished, and in no distress. No distress.  HENT:  Head: Normocephalic and atraumatic.  Eyes: Conjunctivae are normal.  Neck: Neck supple. No thyromegaly present.  Cardiovascular: Normal rate, regular rhythm and normal heart sounds.   No murmur heard. Pulmonary/Chest: Effort normal and breath sounds normal. No respiratory distress.  Abdominal: He exhibits no distension and no mass. There is no tenderness.  Musculoskeletal: He exhibits no edema.  Midline scar over lumbar spine, healing well, slight scabs   Neurological: He is alert and oriented to person, place, and time.  Skin: Skin is warm.  Psychiatric: Memory, affect and judgment normal.    Lab Results  Component Value Date   TSH 1.332 08/02/2012   Lab Results  Component Value Date   WBC 9.3 11/23/2012   HGB 14.1 11/23/2012   HCT 41.5 11/23/2012   MCV 91.0 11/23/2012   PLT 324 11/23/2012   Lab Results  Component Value Date   CREATININE 0.91 11/23/2012   BUN 18 11/23/2012   NA 138 11/23/2012   K 4.2 11/23/2012   CL 104 11/23/2012   CO2 24 11/23/2012   Lab Results  Component Value Date   ALT 12 11/23/2012   AST 13 11/23/2012   ALKPHOS 95 11/23/2012   BILITOT 0.3 11/23/2012   Lab Results  Component Value Date   CHOL 194 03/22/2012   Lab Results  Component Value Date   HDL 41.90 03/22/2012   Lab Results  Component Value Date   LDLCALC 135* 03/22/2012   Lab Results  Component Value Date   TRIG 85.0 03/22/2012   Lab Results  Component Value Date   CHOLHDL 5 03/22/2012     Assessment & Plan   HYPERTENSION Well controlled, no  changes.  COPD Unfortunately he continues to smoke but his breathing has done well during his surgery, encouraged cessation  Back pain, lumbosacral Has just undergone central Decompression and lumbar discectomy at L3-4 and L4-5 and has done wll his pain is improved.

## 2012-12-22 NOTE — Assessment & Plan Note (Signed)
Has just undergone central Decompression and lumbar discectomy at L3-4 and L4-5 and has done wll his pain is improved.

## 2012-12-22 NOTE — Assessment & Plan Note (Signed)
Well controlled, no changes 

## 2012-12-22 NOTE — Assessment & Plan Note (Signed)
Unfortunately he continues to smoke but his breathing has done well during his surgery, encouraged cessation

## 2012-12-24 NOTE — Telephone Encounter (Signed)
Lab order placed.

## 2013-01-16 ENCOUNTER — Other Ambulatory Visit: Payer: Self-pay | Admitting: Family Medicine

## 2013-04-12 LAB — CBC
HCT: 40.6 % (ref 39.0–52.0)
Hemoglobin: 13.7 g/dL (ref 13.0–17.0)
MCH: 30.9 pg (ref 26.0–34.0)
MCHC: 33.7 g/dL (ref 30.0–36.0)
MCV: 91.6 fL (ref 78.0–100.0)
Platelets: 359 10*3/uL (ref 150–400)
RBC: 4.43 MIL/uL (ref 4.22–5.81)
RDW: 15.2 % (ref 11.5–15.5)
WBC: 9.2 10*3/uL (ref 4.0–10.5)

## 2013-04-12 LAB — HEPATIC FUNCTION PANEL
ALT: 14 U/L (ref 0–53)
AST: 17 U/L (ref 0–37)
Albumin: 4.3 g/dL (ref 3.5–5.2)
Alkaline Phosphatase: 93 U/L (ref 39–117)
Bilirubin, Direct: <0.1 mg/dL (ref 0.0–0.3)
Total Bilirubin: 0.3 mg/dL (ref 0.2–1.2)
Total Protein: 6.5 g/dL (ref 6.0–8.3)

## 2013-04-12 LAB — LIPID PANEL
Cholesterol: 158 mg/dL (ref 0–200)
HDL: 43 mg/dL (ref >39)
LDL Cholesterol: 106 mg/dL — ABNORMAL HIGH (ref 0–99)
Total CHOL/HDL Ratio: 3.7 Ratio
Triglycerides: 47 mg/dL (ref <150)
VLDL: 9 mg/dL (ref 0–40)

## 2013-04-12 LAB — RENAL FUNCTION PANEL
Albumin: 4.3 g/dL (ref 3.5–5.2)
BUN: 19 mg/dL (ref 6–23)
CO2: 23 mEq/L (ref 19–32)
Calcium: 9.3 mg/dL (ref 8.4–10.5)
Chloride: 106 mEq/L (ref 96–112)
Creat: 0.82 mg/dL (ref 0.50–1.35)
Glucose, Bld: 109 mg/dL — ABNORMAL HIGH (ref 70–99)
Phosphorus: 2.5 mg/dL (ref 2.3–4.6)
Potassium: 4.7 mEq/L (ref 3.5–5.3)
Sodium: 141 mEq/L (ref 135–145)

## 2013-04-12 LAB — TSH: TSH: 1.319 u[IU]/mL (ref 0.350–4.500)

## 2013-04-18 ENCOUNTER — Telehealth: Payer: Self-pay | Admitting: Family Medicine

## 2013-04-18 ENCOUNTER — Encounter: Payer: Self-pay | Admitting: Family Medicine

## 2013-04-18 ENCOUNTER — Ambulatory Visit (INDEPENDENT_AMBULATORY_CARE_PROVIDER_SITE_OTHER): Payer: Medicare Other | Admitting: Family Medicine

## 2013-04-18 VITALS — BP 154/78 | HR 85 | Temp 98.3°F | Ht 69.5 in | Wt 186.1 lb

## 2013-04-18 DIAGNOSIS — F17200 Nicotine dependence, unspecified, uncomplicated: Secondary | ICD-10-CM

## 2013-04-18 DIAGNOSIS — Z23 Encounter for immunization: Secondary | ICD-10-CM

## 2013-04-18 DIAGNOSIS — G609 Hereditary and idiopathic neuropathy, unspecified: Secondary | ICD-10-CM

## 2013-04-18 DIAGNOSIS — M48062 Spinal stenosis, lumbar region with neurogenic claudication: Secondary | ICD-10-CM

## 2013-04-18 DIAGNOSIS — I10 Essential (primary) hypertension: Secondary | ICD-10-CM

## 2013-04-18 DIAGNOSIS — E785 Hyperlipidemia, unspecified: Secondary | ICD-10-CM

## 2013-04-18 MED ORDER — GABAPENTIN 100 MG PO CAPS: 100.0000 mg | ORAL_CAPSULE | Freq: Three times a day (TID) | ORAL | Status: DC

## 2013-04-18 NOTE — Progress Notes (Signed)
Pre visit review using our clinic review tool, if applicable. No additional management support is needed unless otherwise documented below in the visit note. 

## 2013-04-18 NOTE — Telephone Encounter (Signed)
Relevant patient education mailed to patient.  

## 2013-04-18 NOTE — Patient Instructions (Signed)
Call Dr Amil Amen regarding increased back pain and b/l leg pain  Hypertension As your heart beats, it forces blood through your arteries. This force is your blood pressure. If the pressure is too high, it is called hypertension (HTN) or high blood pressure. HTN is dangerous because you may have it and not know it. High blood pressure may mean that your heart has to work harder to pump blood. Your arteries may be narrow or stiff. The extra work puts you at risk for heart disease, stroke, and other problems.  Blood pressure consists of two numbers, a higher number over a lower, 110/72, for example. It is stated as "110 over 72." The ideal is below 120 for the top number (systolic) and under 80 for the bottom (diastolic). Write down your blood pressure today. You should pay close attention to your blood pressure if you have certain conditions such as:  Heart failure.  Prior heart attack.  Diabetes  Chronic kidney disease.  Prior stroke.  Multiple risk factors for heart disease. To see if you have HTN, your blood pressure should be measured while you are seated with your arm held at the level of the heart. It should be measured at least twice. A one-time elevated blood pressure reading (especially in the Emergency Department) does not mean that you need treatment. There may be conditions in which the blood pressure is different between your right and left arms. It is important to see your caregiver soon for a recheck. Most people have essential hypertension which means that there is not a specific cause. This type of high blood pressure may be lowered by changing lifestyle factors such as:  Stress.  Smoking.  Lack of exercise.  Excessive weight.  Drug/tobacco/alcohol use.  Eating less salt. Most people do not have symptoms from high blood pressure until it has caused damage to the body. Effective treatment can often prevent, delay or reduce that damage. TREATMENT  When a cause has been  identified, treatment for high blood pressure is directed at the cause. There are a large number of medications to treat HTN. These fall into several categories, and your caregiver will help you select the medicines that are best for you. Medications may have side effects. You should review side effects with your caregiver. If your blood pressure stays high after you have made lifestyle changes or started on medicines,   Your medication(s) may need to be changed.  Other problems may need to be addressed.  Be certain you understand your prescriptions, and know how and when to take your medicine.  Be sure to follow up with your caregiver within the time frame advised (usually within two weeks) to have your blood pressure rechecked and to review your medications.  If you are taking more than one medicine to lower your blood pressure, make sure you know how and at what times they should be taken. Taking two medicines at the same time can result in blood pressure that is too low. SEEK IMMEDIATE MEDICAL CARE IF:  You develop a severe headache, blurred or changing vision, or confusion.  You have unusual weakness or numbness, or a faint feeling.  You have severe chest or abdominal pain, vomiting, or breathing problems. MAKE SURE YOU:   Understand these instructions.  Will watch your condition.  Will get help right away if you are not doing well or get worse. Document Released: 01/27/2005 Document Revised: 04/21/2011 Document Reviewed: 09/17/2007 [facility] Patient Information 2014 [place].

## 2013-04-24 DIAGNOSIS — G609 Hereditary and idiopathic neuropathy, unspecified: Secondary | ICD-10-CM | POA: Insufficient documentation

## 2013-04-24 NOTE — Assessment & Plan Note (Signed)
Mild, avoid trans fats, minimize simple carbs and saturated fats.

## 2013-04-24 NOTE — Assessment & Plan Note (Signed)
Unfortunately continues to smoke daily, encouraged cessation once again

## 2013-04-24 NOTE — Progress Notes (Signed)
Patient ID: Ian Gonzales, male   DOB: Jun 15, 1940, 73 y.o.   MRN: [id number] Ian Gonzales [id number] 1940-07-05 04/24/2013      Progress Note-Follow Up  Subjective  Chief Complaint  Chief Complaint  Patient presents with  . Follow-up    4 month  . Injections    tdap    HPI  Patient is a 73 yo male in today for f/u.Marland Kitchen Continues to smoke daily but denies any increased sob. No cp/palp/GU or GI c/o. Has increasing low back pain and increased peripheral neuropathy over the past month R>L. No falls or trauma. Was better directly after his surgery but has worsened recently. Is taking meds as prescribed  Past Medical History  Diagnosis Date  . Hypertension   . GERD (gastroesophageal reflux disease)   . Colon polyps   . Back pain, lumbosacral   . Sinusitis acute 03/24/2011  . Memory loss 04/16/2011  . Allergic state 05/30/2011  . Leukocytosis 12/15/2011  . Hyperlipidemia 02/15/2012  . Unspecified sinusitis (chronic) 04/05/2012  . Epicondylitis 10/10/2012  . Shortness of breath     WITH EXERTION AND BACK PAIN  . Wound drainage     L ELBOW  . Spinal stenosis, lumbar   . Nocturia     Past Surgical History  Procedure Laterality Date  . Lumbar laminectomy  1974    twice  . Urethral stricture dilatation    . Colonoscopy    . Penile prosthesis implant    . Cataracts      REMOVED  . Decompressive lumbar laminectomy level 2 N/A 11/26/2012    Procedure: CENTRAL DECOMPRESSION LUMBAR LAMINECTOMY L3-L4,  L4-L5   ;  Surgeon: Tobi Bastos, MD;  Location: [facility];  Service: Orthopedics;  Laterality: N/A;    History reviewed. No pertinent family history.  History   Social History  . Marital Status: Single    Spouse Name: N/A    Number of Children: N/A  . Years of Education: N/A   Occupational History  . Not on file.   Social History Main Topics  . Smoking status: Current Every Day Smoker -- 2.00 packs/day    Types: Cigarettes  . Smokeless tobacco: Never Used  . Alcohol  Use: Yes     Comment: once in a while   . Drug Use: No  . Sexual Activity: No   Other Topics Concern  . Not on file   Social History Narrative  . No narrative on file    Current Outpatient Prescriptions on File Prior to Visit  Medication Sig Dispense Refill  . acetaminophen (TYLENOL) 325 MG tablet Take 650 mg by mouth every morning.      Marland Kitchen amLODipine (NORVASC) 5 MG tablet take 1 tablet by mouth once daily for blood pressure  30 tablet  6  . aspirin 81 MG tablet Take 81 mg by mouth daily.      . naproxen sodium (ALEVE) 220 MG tablet Take 440 mg by mouth every morning.      . ranitidine (ZANTAC) 300 MG tablet take 1 tablet by mouth at bedtime  30 tablet  5   No current facility-administered medications on file prior to visit.    No Known Allergies  Review of Systems  Review of Systems  Constitutional: Negative for fever and malaise/fatigue.  HENT: Negative for congestion.   Eyes: Negative for discharge.  Respiratory: Negative for shortness of breath.   Cardiovascular: Negative for chest pain, palpitations and leg swelling.  Gastrointestinal: Negative for  nausea, abdominal pain and diarrhea.  Genitourinary: Negative for dysuria.  Musculoskeletal: Positive for back pain and myalgias. Negative for falls.  Skin: Negative for rash.  Neurological: Positive for tingling. Negative for loss of consciousness and headaches.       Feet  Endo/Heme/Allergies: Negative for polydipsia.  Psychiatric/Behavioral: Negative for depression and suicidal ideas. The patient is not nervous/anxious and does not have insomnia.     Objective  BP 154/78  Pulse 85  Temp(Src) 98.3 F (36.8 C) (Oral)  Ht 5' 9.5" (1.765 m)  Wt 186 lb 1.3 oz (84.405 kg)  BMI 27.09 kg/m2  SpO2 96%  Physical Exam  Physical Exam  Constitutional: He is oriented to person, place, and time and well-developed, well-nourished, and in no distress. No distress.  HENT:  Head: Normocephalic and atraumatic.  Eyes:  Conjunctivae are normal.  Neck: Neck supple. No thyromegaly present.  Cardiovascular: Normal rate, regular rhythm and normal heart sounds.   Pulmonary/Chest: Effort normal and breath sounds normal. No respiratory distress.  Abdominal: He exhibits no distension and no mass. There is no tenderness.  Musculoskeletal: He exhibits no edema.  Neurological: He is alert and oriented to person, place, and time.  Skin: Skin is warm.  Psychiatric: Memory, affect and judgment normal.    Lab Results  Component Value Date   TSH 1.319 04/11/2013   Lab Results  Component Value Date   WBC 9.2 04/11/2013   HGB 13.7 04/11/2013   HCT 40.6 04/11/2013   MCV 91.6 04/11/2013   PLT 359 04/11/2013   Lab Results  Component Value Date   CREATININE 0.82 04/11/2013   BUN 19 04/11/2013   NA 141 04/11/2013   K 4.7 04/11/2013   CL 106 04/11/2013   CO2 23 04/11/2013   Lab Results  Component Value Date   ALT 14 04/11/2013   AST 17 04/11/2013   ALKPHOS 93 04/11/2013   BILITOT 0.3 04/11/2013   Lab Results  Component Value Date   CHOL 158 04/11/2013   Lab Results  Component Value Date   HDL 43 04/11/2013   Lab Results  Component Value Date   LDLCALC 106* 04/11/2013   Lab Results  Component Value Date   TRIG 47 04/11/2013   Lab Results  Component Value Date   CHOLHDL 3.7 04/11/2013     Assessment & Plan  HYPERTENSION Improved on recheck, did not take meds today. Encouraged DASH diet and continue to monitor. Given Tdap today  TOBACCO USER Unfortunately continues to smoke daily, encouraged cessation once again  Hyperlipidemia Mild, avoid trans fats, minimize simple carbs and saturated fats.  Spinal stenosis, lumbar region, with neurogenic claudication Improved somewhat after surgical decompression but is slowly worsening encouraged to f/u with neurosurg  Unspecified hereditary and idiopathic peripheral neuropathy Worsening patient willing to try meds, start Gabapentin 100 mg tid

## 2013-04-24 NOTE — Assessment & Plan Note (Signed)
Improved somewhat after surgical decompression but is slowly worsening encouraged to f/u with neurosurg

## 2013-04-24 NOTE — Assessment & Plan Note (Signed)
Improved on recheck, did not take meds today. Encouraged DASH diet and continue to monitor. Given Tdap today

## 2013-04-24 NOTE — Assessment & Plan Note (Signed)
Worsening patient willing to try meds, start Gabapentin 100 mg tid

## 2013-05-24 ENCOUNTER — Encounter: Payer: Self-pay | Admitting: Family Medicine

## 2013-05-24 ENCOUNTER — Ambulatory Visit (INDEPENDENT_AMBULATORY_CARE_PROVIDER_SITE_OTHER): Payer: Medicare Other | Admitting: Family Medicine

## 2013-05-24 ENCOUNTER — Ambulatory Visit (HOSPITAL_BASED_OUTPATIENT_CLINIC_OR_DEPARTMENT_OTHER)
Admission: RE | Admit: 2013-05-24 | Discharge: 2013-05-24 | Disposition: A | Discharge status: Home / Self Care | Payer: Medicare Other | Source: Ambulatory Visit | Attending: Family Medicine | Admitting: Family Medicine

## 2013-05-24 VITALS — BP 188/80 | HR 103 | Temp 98.3°F | Ht 69.5 in | Wt 188.1 lb

## 2013-05-24 DIAGNOSIS — M7150 Other bursitis, not elsewhere classified, unspecified site: Secondary | ICD-10-CM

## 2013-05-24 DIAGNOSIS — B0229 Other postherpetic nervous system involvement: Secondary | ICD-10-CM

## 2013-05-24 DIAGNOSIS — M79609 Pain in unspecified limb: Secondary | ICD-10-CM | POA: Insufficient documentation

## 2013-05-24 DIAGNOSIS — M719 Bursopathy, unspecified: Secondary | ICD-10-CM

## 2013-05-24 DIAGNOSIS — I10 Essential (primary) hypertension: Secondary | ICD-10-CM

## 2013-05-24 DIAGNOSIS — F17200 Nicotine dependence, unspecified, uncomplicated: Secondary | ICD-10-CM

## 2013-05-24 DIAGNOSIS — M79662 Pain in left lower leg: Secondary | ICD-10-CM

## 2013-05-24 MED ORDER — METHYLPREDNISOLONE ACETATE 40 MG/ML IJ SUSP
40.0000 mg | Freq: Once | INTRAMUSCULAR | Status: AC
  Administered 2013-05-24: 40 mg via INTRAMUSCULAR

## 2013-05-24 NOTE — Progress Notes (Signed)
Pre visit review using our clinic review tool, if applicable. No additional management support is needed unless otherwise documented below in the visit note. 

## 2013-05-24 NOTE — Patient Instructions (Signed)
Deep Vein Thrombosis  A deep vein thrombosis (DVT) is a blood clot that develops in the deep, larger veins of the leg, arm, or pelvis. These are more dangerous than clots that might form in veins near the surface of the body. A DVT can lead to complications if the clot breaks off and travels in the bloodstream to the lungs.   A DVT can damage the valves in your leg veins, so that instead of flowing upward, the blood pools in the lower leg. This is called post-thrombotic syndrome, and it can result in pain, swelling, discoloration, and sores on the leg.  CAUSES  Usually, several things contribute to blood clots forming. Contributing factors include:   The flow of blood slows down.   The inside of the vein is damaged in some way.   You have a condition that makes blood clot more easily.  RISK FACTORS  Some people are more likely than others to develop blood clots. Risk factors include:    Older age, especially over 75 years of age.   Having a family history of blood clots or if you have already had a blot clot.   Having major or lengthy surgery. This is especially true for surgery on the hip, knee, or belly (abdomen). Hip surgery is particularly high risk.   Breaking a hip or leg.   Sitting or lying still for a long time. This includes long-distance travel, paralysis, or recovery from an illness or surgery.   Having cancer or cancer treatment.   Having a long, thin tube (catheter) placed inside a vein during a medical procedure.   Being overweight (obese).   Pregnancy and childbirth.   Hormone changes make the blood clot more easily during pregnancy.   The fetus puts pressure on the veins of the pelvis.   There is a risk of injury to veins during delivery or a caesarean. The risk is highest just after childbirth.   Medicines with the male hormone estrogen. This includes birth control pills and hormone replacement therapy.   Smoking.   Other circulation or heart problems.    SIGNS AND SYMPTOMS  When  a clot forms, it can either partially or totally block the blood flow in that vein. Symptoms of a DVT can include:   Swelling of the leg or arm, especially if one side is much worse.   Warmth and redness of the leg or arm, especially if one side is much worse.   Pain in an arm or leg. If the clot is in the leg, symptoms may be more noticeable or worse when standing or walking.  The symptoms of a DVT that has traveled to the lungs (pulmonary embolism, PE) usually start suddenly and include:   Shortness of breath.   Coughing.   Coughing up blood or blood-tinged phlegm.   Chest pain. The chest pain is often worse with deep breaths.   Rapid heartbeat.  Anyone with these symptoms should get emergency medical treatment right away. Call your local emergency services (911 in the [place] if you have these symptoms.  DIAGNOSIS  If a DVT is suspected, your health care provider will take a full medical history and perform a physical exam. Tests that also may be required include:   Blood tests, including studies of the clotting properties of the blood.   Ultrasonography to see if you have clots in your legs or lungs.   X-rays to show the flow of blood when dye is injected into the veins (  venography).   Studies of your lungs if you have any chest symptoms.  PREVENTION   Exercise the legs regularly. Take a brisk 30-minute walk every day.   Maintain a weight that is appropriate for your height.   Avoid sitting or lying in bed for long periods of time without moving your legs.   Women, particularly those over the age of 35 years, should consider the risks and benefits of taking estrogen medicines, including birth control pills.   Do not smoke, especially if you take estrogen medicines.   Long-distance travel can increase your risk of DVT. You should exercise your legs by walking or pumping the muscles every hour.   In-hospital prevention:   Many of the risk factors above relate to situations that exist with  hospitalization, either for illness, injury, or elective surgery.   Your health care provider will assess you for the need for venous thromboembolism prophylaxis when you are admitted to the hospital. If you are having surgery, your surgeon will assess you the day of or day after surgery.   Prevention may include medical and nonmedical measures.  TREATMENT  Once identified, a DVT can be treated. It can also be prevented in some circumstances. Once you have had a DVT, you may be at increased risk for a DVT in the future. The most common treatment for DVT is blood thinning (anticoagulant) medicine, which reduces the blood's tendency to clot. Anticoagulants can stop new blood clots from forming and stop old ones from growing. They cannot dissolve existing clots. Your body does this by itself over time. Anticoagulants can be given by mouth, by IV access, or by injection. Your health care provider will determine the best program for you. Other medicines or treatments that may be used are:   Heparin or related medicines (low molecular weight heparin) are usually the first treatment for a blood clot. They act quickly. However, they cannot be taken orally.   Heparin can cause a fall in a component of blood that stops bleeding and forms blood clots (platelets). You will be monitored with blood tests to be sure this does not occur.   Warfarin is an anticoagulant that can be swallowed. It takes a few days to start working, so usually heparin or related medicines are used in combination. Once warfarin is working, heparin is usually stopped.   Less commonly, clot dissolving drugs (thrombolytics) are used to dissolve a DVT. They carry a high risk of bleeding, so they are used mainly in severe cases, where your life or a limb is threatened.   Very rarely, a blood clot in the leg needs to be removed surgically.   If you are unable to take anticoagulants, your health care provider may arrange for you to have a filter placed  in a main vein in your abdomen. This filter prevents clots from traveling to your lungs.  HOME CARE INSTRUCTIONS   Take all medicines prescribed by your health care provider. Only take over-the-counter or prescription medicines for pain, fever, or discomfort as directed by your health care provider.   Warfarin. Most people will continue taking warfarin after hospital discharge. Your health care provider will advise you on the length of treatment (usually 3 6 months, sometimes lifelong).   Too much and too little warfarin are both dangerous. Too much warfarin increases the risk of bleeding. Too little warfarin continues to allow the risk for blood clots. While taking warfarin, you will need to have regular blood tests to measure your   blood clotting time. These blood tests usually include both the prothrombin time (PT) and international normalized ratio (INR) tests. The PT and INR results allow your health care provider to adjust your dose of warfarin. The dose can change for many reasons. It is critically important that you take warfarin exactly as prescribed, and that you have your PT and INR levels drawn exactly as directed.   Many foods, especially foods high in vitamin K, can interfere with warfarin and affect the PT and INR results. Foods high in vitamin K include spinach, kale, broccoli, cabbage, collard and turnip greens, brussel sprouts, peas, cauliflower, seaweed, and parsley as well as beef and pork liver, green tea, and soybean oil. You should eat a consistent amount of foods high in vitamin K. Avoid major changes in your diet, or notify your health care provider before changing your diet. Arrange a visit with a dietitian to answer your questions.   Many medicines can interfere with warfarin and affect the PT and INR results. You must tell your health care provider about any and all medicines you take. This includes all vitamins and supplements. Be especially cautious with aspirin and  anti-inflammatory medicines. Ask your health care provider before taking these. Do not take or discontinue any prescribed or over-the-counter medicine except on the advice of your health care provider or pharmacist.   Warfarin can have side effects, primarily excessive bruising or bleeding. You will need to hold pressure over cuts for longer than usual. Your health care provider or pharmacist will discuss other potential side effects.   Alcohol can change the body's ability to handle warfarin. It is best to avoid alcoholic drinks or consume only very small amounts while taking warfarin. Notify your health care provider if you change your alcohol intake.   Notify your dentist or other health care providers before procedures.   Activity. Ask your health care provider how soon you can go back to normal activities. It is important to stay active to prevent blood clots. If you are on anticoagulant medicine, avoid contact sports.   Exercise. It is very important to exercise. This is especially important while traveling, sitting, or standing for long periods of time. Exercise your legs by walking or by pumping the muscles frequently. Take frequent walks.   Compression stockings. These are tight elastic stockings that apply pressure to the lower legs. This pressure can help keep the blood in the legs from clotting. You may need to wear compression stockings at home to help prevent a DVT.   Do not smoke. If you smoke, quit. Ask your health care provider for help with quitting smoking.   Learn as much as you can about DVT. Knowing more about the condition should help you keep it from coming back.   Wear a medical alert bracelet or carry a medical alert card.  SEEK MEDICAL CARE IF:   You notice a rapid heartbeat.   You feel weaker or more tired than usual.   You feel faint.   You notice increased bruising.   You feel your symptoms are not getting better in the time expected.   You believe you are having side  effects of medicine.  SEEK IMMEDIATE MEDICAL CARE IF:   You have chest pain.   You have trouble breathing.   You have new or increased swelling or pain in one leg.   You cough up blood.   You notice blood in vomit, in a bowel movement, or in urine.  MAKE SURE   YOU:   Understand these instructions.   Will watch your condition.   Will get help right away if you are not doing well or get worse.  Document Released: 01/27/2005 Document Revised: 11/17/2012 Document Reviewed: 10/04/2012  [employer] Patient Information 2014 [employer].

## 2013-05-29 ENCOUNTER — Encounter: Payer: Self-pay | Admitting: Family Medicine

## 2013-05-29 DIAGNOSIS — M79662 Pain in left lower leg: Secondary | ICD-10-CM | POA: Insufficient documentation

## 2013-05-29 NOTE — Assessment & Plan Note (Signed)
Ultrasound negative for DVT. May try topical treatments

## 2013-05-29 NOTE — Assessment & Plan Note (Addendum)
Improved control on recheck no changes to meds. Encouraged heart healthy diet such as the DASH diet and exercise as tolerated.

## 2013-05-29 NOTE — Assessment & Plan Note (Signed)
Has not been taking the Gabapentin as he felt it made his legs hurt more. Declines further meds

## 2013-05-29 NOTE — Progress Notes (Signed)
Patient ID: Ian Gonzales, male   DOB: 1940-04-09, 73 y.o.   MRN: [id number] SAILOR HAUGHN [id number] June 08, 1940 05/29/2013      Progress Note-Follow Up  Subjective  Chief Complaint  Chief Complaint  Patient presents with  . Follow-up    1 month    HPI  Patient is a 73 year old male in today for routine medical care. Stopped the Gabapentin because he flet it made his leg pain worse and he felt light heated. He is noting left calf pain x 8 days, denies any trauma or injury. Is also noting persistent low back and b/l hip pain. Continues to smoke. Denies CP/palp/SOB/HA/congestion/fevers/GI or GU c/o. Taking meds as prescribed  Past Medical History  Diagnosis Date  . Hypertension   . GERD (gastroesophageal reflux disease)   . Colon polyps   . Back pain, lumbosacral   . Sinusitis acute 03/24/2011  . Memory loss 04/16/2011  . Allergic state 05/30/2011  . Leukocytosis 12/15/2011  . Hyperlipidemia 02/15/2012  . Unspecified sinusitis (chronic) 04/05/2012  . Epicondylitis 10/10/2012  . Shortness of breath     WITH EXERTION AND BACK PAIN  . Wound drainage     L ELBOW  . Spinal stenosis, lumbar   . Nocturia     Past Surgical History  Procedure Laterality Date  . Lumbar laminectomy  1974    twice  . Urethral stricture dilatation    . Colonoscopy    . Penile prosthesis implant    . Cataracts      REMOVED  . Decompressive lumbar laminectomy level 2 N/A 11/26/2012    Procedure: CENTRAL DECOMPRESSION LUMBAR LAMINECTOMY L3-L4,  L4-L5   ;  Surgeon: Tobi Bastos, MD;  Location: [facility];  Service: Orthopedics;  Laterality: N/A;    History reviewed. No pertinent family history.  History   Social History  . Marital Status: Single    Spouse Name: N/A    Number of Children: N/A  . Years of Education: N/A   Occupational History  . Not on file.   Social History Main Topics  . Smoking status: Current Every Day Smoker -- 2.00 packs/day    Types: Cigarettes  . Smokeless tobacco:  Never Used  . Alcohol Use: Yes     Comment: once in a while   . Drug Use: No  . Sexual Activity: No   Other Topics Concern  . Not on file   Social History Narrative  . No narrative on file    Current Outpatient Prescriptions on File Prior to Visit  Medication Sig Dispense Refill  . acetaminophen (TYLENOL) 325 MG tablet Take 650 mg by mouth every morning.      Marland Kitchen amLODipine (NORVASC) 5 MG tablet take 1 tablet by mouth once daily for blood pressure  30 tablet  6  . aspirin 81 MG tablet Take 81 mg by mouth daily.      . naproxen sodium (ALEVE) 220 MG tablet Take 440 mg by mouth every morning.      . ranitidine (ZANTAC) 300 MG tablet take 1 tablet by mouth at bedtime  30 tablet  5   No current facility-administered medications on file prior to visit.    Allergies  Allergen Reactions  . Gabapentin     disequilibrium    Review of Systems  Review of Systems  Constitutional: Positive for malaise/fatigue. Negative for fever.  HENT: Negative for congestion.   Eyes: Negative for discharge.  Respiratory: Negative for shortness of breath.  Cardiovascular: Negative for chest pain, palpitations and leg swelling.  Gastrointestinal: Negative for nausea, abdominal pain and diarrhea.  Genitourinary: Negative for dysuria.  Musculoskeletal: Positive for back pain, joint pain and myalgias. Negative for falls.  Skin: Negative for rash.  Neurological: Negative for loss of consciousness and headaches.  Endo/Heme/Allergies: Negative for polydipsia.  Psychiatric/Behavioral: Positive for depression. Negative for suicidal ideas. The patient is not nervous/anxious and does not have insomnia.     Objective  BP 188/80  Pulse 103  Temp(Src) 98.3 F (36.8 C) (Oral)  Ht 5' 9.5" (1.765 m)  Wt 188 lb 1.3 oz (85.313 kg)  BMI 27.39 kg/m2  SpO2 96%  Physical Exam  Physical Exam  Constitutional: He is oriented to person, place, and time and well-developed, well-nourished, and in no distress. No  distress.  HENT:  Head: Normocephalic and atraumatic.  Eyes: Conjunctivae are normal.  Neck: Neck supple. No thyromegaly present.  Cardiovascular: Normal rate, regular rhythm and normal heart sounds.   No murmur heard. Pulmonary/Chest: Effort normal and breath sounds normal. No respiratory distress.  Abdominal: He exhibits no distension and no mass. There is no tenderness.  Musculoskeletal: He exhibits no edema.  Neurological: He is alert and oriented to person, place, and time.  Skin: Skin is warm.  Psychiatric: Memory, affect and judgment normal.    Lab Results  Component Value Date   TSH 1.319 04/11/2013   Lab Results  Component Value Date   WBC 9.2 04/11/2013   HGB 13.7 04/11/2013   HCT 40.6 04/11/2013   MCV 91.6 04/11/2013   PLT 359 04/11/2013   Lab Results  Component Value Date   CREATININE 0.82 04/11/2013   BUN 19 04/11/2013   NA 141 04/11/2013   K 4.7 04/11/2013   CL 106 04/11/2013   CO2 23 04/11/2013   Lab Results  Component Value Date   ALT 14 04/11/2013   AST 17 04/11/2013   ALKPHOS 93 04/11/2013   BILITOT 0.3 04/11/2013   Lab Results  Component Value Date   CHOL 158 04/11/2013   Lab Results  Component Value Date   HDL 43 04/11/2013   Lab Results  Component Value Date   LDLCALC 106* 04/11/2013   Lab Results  Component Value Date   TRIG 47 04/11/2013   Lab Results  Component Value Date   CHOLHDL 3.7 04/11/2013     Assessment & Plan  TOBACCO USER Encouraged complete cessation. Discussed need to quit as relates to risk of numerous cancers, cardiac and pulmonary disease as well as neurologic complications. Counseled for greater than 3 minutes  Postherpetic neuralgia Has not been taking the Gabapentin as he felt it made his legs hurt more. Declines further meds  HYPERTENSION Improved control on recheck no changes to meds. Encouraged heart healthy diet such as the DASH diet and exercise as tolerated.   Pain of left calf Ultrasound negative for DVT. May try topical  treatments

## 2013-05-29 NOTE — Assessment & Plan Note (Signed)
Encouraged complete cessation. Discussed need to quit as relates to risk of numerous cancers, cardiac and pulmonary disease as well as neurologic complications. Counseled for greater than 3 minutes

## 2013-07-08 ENCOUNTER — Other Ambulatory Visit: Payer: Self-pay | Admitting: Family Medicine

## 2013-07-15 ENCOUNTER — Other Ambulatory Visit: Payer: Self-pay | Admitting: Family Medicine

## 2013-09-01 ENCOUNTER — Other Ambulatory Visit: Payer: Self-pay | Admitting: Family Medicine

## 2013-09-01 ENCOUNTER — Other Ambulatory Visit: Payer: Self-pay

## 2013-09-01 DIAGNOSIS — N400 Enlarged prostate without lower urinary tract symptoms: Secondary | ICD-10-CM

## 2013-09-01 DIAGNOSIS — E785 Hyperlipidemia, unspecified: Secondary | ICD-10-CM

## 2013-09-01 DIAGNOSIS — I10 Essential (primary) hypertension: Secondary | ICD-10-CM

## 2013-09-01 LAB — RENAL FUNCTION PANEL
Albumin: 4.1 g/dL (ref 3.5–5.2)
BUN: 14 mg/dL (ref 6–23)
CHLORIDE: 105 meq/L (ref 96–112)
CO2: 20 meq/L (ref 19–32)
CREATININE: 0.89 mg/dL (ref 0.50–1.35)
Calcium: 8.9 mg/dL (ref 8.4–10.5)
GLUCOSE: 140 mg/dL — AB (ref 70–99)
POTASSIUM: 3.9 meq/L (ref 3.5–5.3)
Phosphorus: 2.2 mg/dL — ABNORMAL LOW (ref 2.3–4.6)
Sodium: 136 mEq/L (ref 135–145)

## 2013-09-01 LAB — LIPID PANEL
CHOLESTEROL: 192 mg/dL (ref 0–200)
HDL: 36 mg/dL — ABNORMAL LOW (ref >39)
LDL Cholesterol: 136 mg/dL — ABNORMAL HIGH (ref 0–99)
TRIGLYCERIDES: 99 mg/dL (ref <150)
Total CHOL/HDL Ratio: 5.3 Ratio
VLDL: 20 mg/dL (ref 0–40)

## 2013-09-01 LAB — CBC
HCT: 40.4 % (ref 39.0–52.0)
HEMOGLOBIN: 13.9 g/dL (ref 13.0–17.0)
MCH: 31 pg (ref 26.0–34.0)
MCHC: 34.4 g/dL (ref 30.0–36.0)
MCV: 90 fL (ref 78.0–100.0)
PLATELETS: 311 10*3/uL (ref 150–400)
RBC: 4.49 MIL/uL (ref 4.22–5.81)
RDW: 14.6 % (ref 11.5–15.5)
WBC: 8.1 10*3/uL (ref 4.0–10.5)

## 2013-09-01 LAB — HEPATIC FUNCTION PANEL
ALBUMIN: 4.1 g/dL (ref 3.5–5.2)
ALT: 12 U/L (ref 0–53)
AST: 14 U/L (ref 0–37)
Alkaline Phosphatase: 85 U/L (ref 39–117)
BILIRUBIN INDIRECT: 0.4 mg/dL (ref 0.2–1.2)
Bilirubin, Direct: 0.1 mg/dL (ref 0.0–0.3)
Total Bilirubin: 0.5 mg/dL (ref 0.2–1.2)
Total Protein: 6.9 g/dL (ref 6.0–8.3)

## 2013-09-01 LAB — TSH: TSH: 1.73 u[IU]/mL (ref 0.350–4.500)

## 2013-09-05 ENCOUNTER — Ambulatory Visit (INDEPENDENT_AMBULATORY_CARE_PROVIDER_SITE_OTHER): Payer: Medicare Other | Admitting: Family Medicine

## 2013-09-05 ENCOUNTER — Ambulatory Visit (HOSPITAL_BASED_OUTPATIENT_CLINIC_OR_DEPARTMENT_OTHER)
Admission: RE | Admit: 2013-09-05 | Discharge: 2013-09-05 | Disposition: A | Discharge status: Home / Self Care | Payer: Medicare Other | Source: Ambulatory Visit | Attending: Family Medicine | Admitting: Family Medicine

## 2013-09-05 ENCOUNTER — Encounter: Payer: Self-pay | Admitting: Family Medicine

## 2013-09-05 VITALS — BP 124/68 | HR 85 | Temp 98.3°F | Ht 69.5 in | Wt 194.1 lb

## 2013-09-05 DIAGNOSIS — R7309 Other abnormal glucose: Secondary | ICD-10-CM

## 2013-09-05 DIAGNOSIS — G609 Hereditary and idiopathic neuropathy, unspecified: Secondary | ICD-10-CM

## 2013-09-05 DIAGNOSIS — K219 Gastro-esophageal reflux disease without esophagitis: Secondary | ICD-10-CM

## 2013-09-05 DIAGNOSIS — R32 Unspecified urinary incontinence: Secondary | ICD-10-CM

## 2013-09-05 DIAGNOSIS — R059 Cough, unspecified: Secondary | ICD-10-CM | POA: Diagnosis present

## 2013-09-05 DIAGNOSIS — R5383 Other fatigue: Secondary | ICD-10-CM

## 2013-09-05 DIAGNOSIS — R739 Hyperglycemia, unspecified: Secondary | ICD-10-CM

## 2013-09-05 DIAGNOSIS — E785 Hyperlipidemia, unspecified: Secondary | ICD-10-CM

## 2013-09-05 DIAGNOSIS — R042 Hemoptysis: Secondary | ICD-10-CM | POA: Insufficient documentation

## 2013-09-05 DIAGNOSIS — R5381 Other malaise: Secondary | ICD-10-CM | POA: Insufficient documentation

## 2013-09-05 DIAGNOSIS — M48062 Spinal stenosis, lumbar region with neurogenic claudication: Secondary | ICD-10-CM

## 2013-09-05 DIAGNOSIS — R05 Cough: Secondary | ICD-10-CM

## 2013-09-05 DIAGNOSIS — I10 Essential (primary) hypertension: Secondary | ICD-10-CM

## 2013-09-05 DIAGNOSIS — F17200 Nicotine dependence, unspecified, uncomplicated: Secondary | ICD-10-CM

## 2013-09-05 LAB — CBC
HCT: 37.8 % — ABNORMAL LOW (ref 39.0–52.0)
Hemoglobin: 13.3 g/dL (ref 13.0–17.0)
MCH: 31.1 pg (ref 26.0–34.0)
MCHC: 35.2 g/dL (ref 30.0–36.0)
MCV: 88.5 fL (ref 78.0–100.0)
Platelets: 319 10*3/uL (ref 150–400)
RBC: 4.27 MIL/uL (ref 4.22–5.81)
RDW: 14.8 % (ref 11.5–15.5)
WBC: 8.8 10*3/uL (ref 4.0–10.5)

## 2013-09-05 LAB — RENAL FUNCTION PANEL
Albumin: 4.3 g/dL (ref 3.5–5.2)
BUN: 17 mg/dL (ref 6–23)
CALCIUM: 9.1 mg/dL (ref 8.4–10.5)
CO2: 21 mEq/L (ref 19–32)
CREATININE: 0.81 mg/dL (ref 0.50–1.35)
Chloride: 108 mEq/L (ref 96–112)
GLUCOSE: 124 mg/dL — AB (ref 70–99)
Phosphorus: 2.8 mg/dL (ref 2.3–4.6)
Potassium: 3.7 mEq/L (ref 3.5–5.3)
Sodium: 136 mEq/L (ref 135–145)

## 2013-09-05 LAB — HEMOGLOBIN A1C
Hgb A1c MFr Bld: 5.8 % — ABNORMAL HIGH (ref <5.7)
Mean Plasma Glucose: 120 mg/dL — ABNORMAL HIGH (ref <117)

## 2013-09-05 NOTE — Progress Notes (Signed)
Pre visit review using our clinic review tool, if applicable. No additional management support is needed unless otherwise documented below in the visit note. 

## 2013-09-05 NOTE — Patient Instructions (Signed)
Consider a probiotic daily such as Align, Digestive Advantage or [place] a fiber supplement.  Basic Carbohydrate Counting for Diabetes Mellitus Carbohydrate counting is a method for keeping track of the amount of carbohydrates you eat. Eating carbohydrates naturally increases the level of sugar (glucose) in your blood, so it is important for you to know the amount that is okay for you to have in every meal. Carbohydrate counting helps keep the level of glucose in your blood within normal limits. The amount of carbohydrates allowed is different for every person. A dietitian can help you calculate the amount that is right for you. Once you know the amount of carbohydrates you can have, you can count the carbohydrates in the foods you want to eat. Carbohydrates are found in the following foods:  Grains, such as breads and cereals.  Dried beans and soy products.  Starchy vegetables, such as potatoes, peas, and corn.  Fruit and fruit juices.  Milk and yogurt.  Sweets and snack foods, such as cake, cookies, candy, chips, soft drinks, and fruit drinks. CARBOHYDRATE COUNTING There are two ways to count the carbohydrates in your food. You can use either of the methods or a combination of both. Reading the "Nutrition Facts" on [place] The "Nutrition Facts" is an area that is included on the labels of almost all packaged food and beverages in the [place]. It includes the serving size of that food or beverage and information about the nutrients in each serving of the food, including the grams (g) of carbohydrate per serving.  Decide the number of servings of this food or beverage that you will be able to eat or drink. Multiply that number of servings by the number of grams of carbohydrate that is listed on the label for that serving. The total will be the amount of carbohydrates you will be having when you eat or drink this food or beverage. Learning Standard Serving Sizes of  Food When you eat food that is not packaged or does not include "Nutrition Facts" on the label, you need to measure the servings in order to count the amount of carbohydrates.A serving of most carbohydrate-rich foods contains about 15 g of carbohydrates. The following list includes serving sizes of carbohydrate-rich foods that provide 15 g ofcarbohydrate per serving:   1 slice of bread (1 oz) or 1 six-inch tortilla.    of a hamburger bun or English muffin.  4-6 crackers.   cup unsweetened dry cereal.    cup hot cereal.   cup rice or pasta.    cup mashed potatoes or  of a large baked potato.  1 cup fresh fruit or one small piece of fruit.    cup canned or frozen fruit or fruit juice.  1 cup milk.   cup plain fat-free yogurt or yogurt sweetened with artificial sweeteners.   cup cooked dried beans or starchy vegetable, such as peas, corn, or potatoes.  Decide the number of standard-size servings that you will eat. Multiply that number of servings by 15 (the grams of carbohydrates in that serving). For example, if you eat 2 cups of strawberries, you will have eaten 2 servings and 30 g of carbohydrates (2 servings x 15 g = 30 g). For foods such as soups and casseroles, in which more than one food is mixed in, you will need to count the carbohydrates in each food that is included. EXAMPLE OF CARBOHYDRATE COUNTING Sample Dinner  3 oz chicken breast.   cup  of brown rice.   cup of corn.  1 cup milk.   1 cup strawberries with sugar-free whipped topping.  Carbohydrate Calculation Step 1: Identify the foods that contain carbohydrates:   Rice.   Corn.   Milk.   Strawberries. Step 2:Calculate the number of servings eaten of each:   2 servings of rice.   1 serving of corn.   1 serving of milk.   1 serving of strawberries. Step 3: Multiply each of those number of servings by 15 g:   2 servings of rice x 15 g = 30 g.   1 serving of corn x 15 g  = 15 g.   1 serving of milk x 15 g = 15 g.   1 serving of strawberries x 15 g = 15 g. Step 4: Add together all of the amounts to find the total grams of carbohydrates eaten: 30 g + 15 g + 15 g + 15 g = 75 g. Document Released: 01/27/2005 Document Revised: 06/13/2013 Document Reviewed: 12/24/2012 [facility] Patient Information 2015 [place], [place]. This information is not intended to replace advice given to you by your health care provider. Make sure you discuss any questions you have with your health care provider.  [facility] health or a generic.

## 2013-09-06 ENCOUNTER — Telehealth: Payer: Self-pay

## 2013-09-06 DIAGNOSIS — D649 Anemia, unspecified: Secondary | ICD-10-CM

## 2013-09-06 LAB — URINALYSIS
BILIRUBIN URINE: NEGATIVE
Glucose, UA: NEGATIVE mg/dL
Hgb urine dipstick: NEGATIVE
KETONES UR: NEGATIVE mg/dL
Leukocytes, UA: NEGATIVE
Nitrite: NEGATIVE
Protein, ur: NEGATIVE mg/dL
Specific Gravity, Urine: 1.027 (ref 1.005–1.030)
UROBILINOGEN UA: 0.2 mg/dL (ref 0.0–1.0)
pH: 5 (ref 5.0–8.0)

## 2013-09-06 LAB — URINE CULTURE
Colony Count: NO GROWTH
Organism ID, Bacteria: NO GROWTH

## 2013-09-06 NOTE — Telephone Encounter (Signed)
ifob order placed

## 2013-09-11 ENCOUNTER — Encounter: Payer: Self-pay | Admitting: Family Medicine

## 2013-09-11 DIAGNOSIS — R739 Hyperglycemia, unspecified: Secondary | ICD-10-CM | POA: Insufficient documentation

## 2013-09-11 NOTE — Assessment & Plan Note (Signed)
Well controlled, no changes to meds. Encouraged heart healthy diet such as the DASH diet and exercise as tolerated.  °

## 2013-09-11 NOTE — Assessment & Plan Note (Signed)
Continues to struggle with pain symptoms but does not tolerate pain meds well. No changes.

## 2013-09-11 NOTE — Assessment & Plan Note (Signed)
Avoid offending foods, start probiotics. Do not eat large meals in late evening and consider raising head of bed.  

## 2013-09-11 NOTE — Progress Notes (Signed)
Patient ID: Ian Gonzales, male   DOB: [birth year], 73 y.o.   MRN: [id number] Ian Gonzales [id number] 1940-12-01 09/11/2013      Progress Note-Follow Up  Subjective  Chief Complaint  Chief Complaint  Patient presents with  . Follow-up    3 month  . Fatigue    no energy X 1 month- has been drinking drinks gatorade and water  . feet and legs burning    X 1.5 weeks    HPI  Patient is a 73 year old male in today for routine medical care. C/o persistnet fatigue, weakness and malaise, acknowledges some depression but declines meds. No suicidal ideation. C/o some episdes of feeling light headed over the past  Month. Also some notes some trouble with some congestion, blurry vision with itchy eyes an has an appt with his opthamologist Dr Sherlean Foot. Denies CP/palp/SOB/HA/congestion/fevers/GI or GU c/o. Taking meds as prescribed  Past Medical History  Diagnosis Date  . Hypertension   . GERD (gastroesophageal reflux disease)   . Colon polyps   . Back pain, lumbosacral   . Sinusitis acute 03/24/2011  . Memory loss 04/16/2011  . Allergic state 05/30/2011  . Leukocytosis 12/15/2011  . Hyperlipidemia 02/15/2012  . Unspecified sinusitis (chronic) 04/05/2012  . Epicondylitis 10/10/2012  . Shortness of breath     WITH EXERTION AND BACK PAIN  . Wound drainage     L ELBOW  . Spinal stenosis, lumbar   . Nocturia     Past Surgical History  Procedure Laterality Date  . Lumbar laminectomy  1974    twice  . Urethral stricture dilatation    . Colonoscopy    . Penile prosthesis implant    . Cataracts      REMOVED  . Decompressive lumbar laminectomy level 2 N/A 11/26/2012    Procedure: CENTRAL DECOMPRESSION LUMBAR LAMINECTOMY L3-L4,  L4-L5   ;  Surgeon: Tobi Bastos, MD;  Location: [facility];  Service: Orthopedics;  Laterality: N/A;    History reviewed. No pertinent family history.  History   Social History  . Marital Status: Single    Spouse Name: N/A    Number of Children: N/A  . Years  of Education: N/A   Occupational History  . Not on file.   Social History Main Topics  . Smoking status: Current Every Day Smoker -- 2.00 packs/day    Types: Cigarettes  . Smokeless tobacco: Never Used  . Alcohol Use: Yes     Comment: once in a while   . Drug Use: No  . Sexual Activity: No   Other Topics Concern  . Not on file   Social History Narrative  . No narrative on file    Current Outpatient Prescriptions on File Prior to Visit  Medication Sig Dispense Refill  . acetaminophen (TYLENOL) 325 MG tablet Take 650 mg by mouth every morning.      Marland Kitchen amLODipine (NORVASC) 5 MG tablet take 1 tablet by mouth once daily for high blood pressure  30 tablet  6  . aspirin 81 MG tablet Take 81 mg by mouth daily.      . naproxen sodium (ALEVE) 220 MG tablet Take 440 mg by mouth every morning.      . ranitidine (ZANTAC) 300 MG tablet take 1 tablet by mouth at bedtime  30 tablet  5   No current facility-administered medications on file prior to visit.    Allergies  Allergen Reactions  . Gabapentin     disequilibrium  Review of Systems  Review of Systems  Constitutional: Positive for malaise/fatigue. Negative for fever.  HENT: Negative for congestion.   Eyes: Negative for discharge.  Respiratory: Negative for shortness of breath.   Cardiovascular: Negative for chest pain, palpitations and leg swelling.  Gastrointestinal: Negative for nausea, abdominal pain and diarrhea.  Genitourinary: Negative for urgency, frequency and hematuria.  Musculoskeletal: Positive for back pain and myalgias. Negative for falls.  Skin: Negative for rash.  Neurological: Positive for sensory change and weakness. Negative for loss of consciousness and headaches.  Endo/Heme/Allergies: Negative for polydipsia.  Psychiatric/Behavioral: Negative for depression and suicidal ideas. The patient is not nervous/anxious and does not have insomnia.     Objective  BP 124/68  Pulse 85  Temp(Src) 98.3 F (36.8  C) (Oral)  Ht 5' 9.5" (1.765 m)  Wt 194 lb 1.3 oz (88.034 kg)  BMI 28.26 kg/m2  SpO2 96%  Physical Exam  Physical Exam  Constitutional: He is oriented to person, place, and time and well-developed, well-nourished, and in no distress. No distress.  HENT:  Head: Normocephalic and atraumatic.  Eyes: Conjunctivae are normal.  Neck: Neck supple. No thyromegaly present.  Cardiovascular: Normal rate, regular rhythm and normal heart sounds.   No murmur heard. Pulmonary/Chest: Effort normal and breath sounds normal. No respiratory distress.  Abdominal: He exhibits no distension and no mass. There is no tenderness.  Musculoskeletal: He exhibits no edema.  Neurological: He is alert and oriented to person, place, and time.  Skin: Skin is warm.  Psychiatric: Memory, affect and judgment normal.    Lab Results  Component Value Date   TSH 1.730 09/01/2013   Lab Results  Component Value Date   WBC 8.8 09/05/2013   HGB 13.3 09/05/2013   HCT 37.8* 09/05/2013   MCV 88.5 09/05/2013   PLT 319 09/05/2013   Lab Results  Component Value Date   CREATININE 0.81 09/05/2013   BUN 17 09/05/2013   NA 136 09/05/2013   K 3.7 09/05/2013   CL 108 09/05/2013   CO2 21 09/05/2013   Lab Results  Component Value Date   ALT 12 09/01/2013   AST 14 09/01/2013   ALKPHOS 85 09/01/2013   BILITOT 0.5 09/01/2013   Lab Results  Component Value Date   CHOL 192 09/01/2013   Lab Results  Component Value Date   HDL 36* 09/01/2013   Lab Results  Component Value Date   LDLCALC 136* 09/01/2013   Lab Results  Component Value Date   TRIG 99 09/01/2013   Lab Results  Component Value Date   CHOLHDL 5.3 09/01/2013     Assessment & Plan  HYPERTENSION Well controlled, no changes to meds. Encouraged heart healthy diet such as the DASH diet and exercise as tolerated.   GERD Avoid offending foods, start probiotics. Do not eat large meals in late evening and consider raising head of bed.   TOBACCO USER Encouraged  complete cessation. Discussed need to quit as relates to risk of numerous cancers, cardiac and pulmonary disease as well as neurologic complications. Counseled for greater than 3 minutes  Hyperlipidemia Encouraged heart healthy diet, increase exercise, avoid trans fats, consider a krill oil cap daily  Hyperglycemia hgba1c acceptable, minimize simple carbs. Increase exercise as tolerated  Spinal stenosis, lumbar region, with neurogenic claudication Continues to struggle with pain symptoms but does not tolerate pain meds well. No changes.  Unspecified hereditary and idiopathic peripheral neuropathy Symptoms persist. No changes due to patient feels he can tolerate them and  does not tolerate meds

## 2013-09-11 NOTE — Assessment & Plan Note (Signed)
Encouraged heart healthy diet, increase exercise, avoid trans fats, consider a krill oil cap daily 

## 2013-09-11 NOTE — Assessment & Plan Note (Signed)
hgba1c acceptable, minimize simple carbs. Increase exercise as tolerated.  

## 2013-09-11 NOTE — Assessment & Plan Note (Signed)
Symptoms persist. No changes due to patient feels he can tolerate them and does not tolerate meds

## 2013-09-11 NOTE — Assessment & Plan Note (Signed)
Encouraged complete cessation. Discussed need to quit as relates to risk of numerous cancers, cardiac and pulmonary disease as well as neurologic complications. Counseled for greater than 3 minutes

## 2013-10-21 ENCOUNTER — Ambulatory Visit (INDEPENDENT_AMBULATORY_CARE_PROVIDER_SITE_OTHER): Payer: Medicare Other | Admitting: Family Medicine

## 2013-10-21 ENCOUNTER — Encounter: Payer: Self-pay | Admitting: Family Medicine

## 2013-10-21 VITALS — BP 177/84 | HR 97 | Temp 98.5°F | Ht 69.5 in | Wt 195.0 lb

## 2013-10-21 DIAGNOSIS — D6489 Other specified anemias: Secondary | ICD-10-CM

## 2013-10-21 DIAGNOSIS — N400 Enlarged prostate without lower urinary tract symptoms: Secondary | ICD-10-CM

## 2013-10-21 DIAGNOSIS — K219 Gastro-esophageal reflux disease without esophagitis: Secondary | ICD-10-CM

## 2013-10-21 DIAGNOSIS — M545 Low back pain, unspecified: Secondary | ICD-10-CM

## 2013-10-21 DIAGNOSIS — Z23 Encounter for immunization: Secondary | ICD-10-CM

## 2013-10-21 DIAGNOSIS — E785 Hyperlipidemia, unspecified: Secondary | ICD-10-CM

## 2013-10-21 DIAGNOSIS — I10 Essential (primary) hypertension: Secondary | ICD-10-CM

## 2013-10-21 DIAGNOSIS — M533 Sacrococcygeal disorders, not elsewhere classified: Secondary | ICD-10-CM

## 2013-10-21 DIAGNOSIS — G609 Hereditary and idiopathic neuropathy, unspecified: Secondary | ICD-10-CM

## 2013-10-21 DIAGNOSIS — F17200 Nicotine dependence, unspecified, uncomplicated: Secondary | ICD-10-CM

## 2013-10-21 NOTE — Patient Instructions (Signed)

## 2013-10-21 NOTE — Progress Notes (Signed)
Pre visit review using our clinic review tool, if applicable. No additional management support is needed unless otherwise documented below in the visit note. 

## 2013-10-22 LAB — RENAL FUNCTION PANEL
Albumin: 4.1 g/dL (ref 3.5–5.2)
BUN: 20 mg/dL (ref 6–23)
CHLORIDE: 106 meq/L (ref 96–112)
CO2: 19 mEq/L (ref 19–32)
Calcium: 8.9 mg/dL (ref 8.4–10.5)
Creat: 0.91 mg/dL (ref 0.50–1.35)
Glucose, Bld: 84 mg/dL (ref 70–99)
PHOSPHORUS: 2.4 mg/dL (ref 2.3–4.6)
Potassium: 4.3 mEq/L (ref 3.5–5.3)
Sodium: 136 mEq/L (ref 135–145)

## 2013-10-22 LAB — CBC
HEMATOCRIT: 40.8 % (ref 39.0–52.0)
Hemoglobin: 13.7 g/dL (ref 13.0–17.0)
MCH: 31.1 pg (ref 26.0–34.0)
MCHC: 33.6 g/dL (ref 30.0–36.0)
MCV: 92.7 fL (ref 78.0–100.0)
Platelets: 325 10*3/uL (ref 150–400)
RBC: 4.4 MIL/uL (ref 4.22–5.81)
RDW: 14.1 % (ref 11.5–15.5)
WBC: 9.4 10*3/uL (ref 4.0–10.5)

## 2013-10-22 LAB — LIPID PANEL
Cholesterol: 165 mg/dL (ref 0–200)
HDL: 33 mg/dL — ABNORMAL LOW (ref >39)
LDL CALC: 82 mg/dL (ref 0–99)
Total CHOL/HDL Ratio: 5 Ratio
Triglycerides: 250 mg/dL — ABNORMAL HIGH (ref <150)
VLDL: 50 mg/dL — AB (ref 0–40)

## 2013-10-22 LAB — HEPATIC FUNCTION PANEL
ALK PHOS: 81 U/L (ref 39–117)
ALT: 18 U/L (ref 0–53)
AST: 17 U/L (ref 0–37)
Albumin: 4.1 g/dL (ref 3.5–5.2)
BILIRUBIN DIRECT: 0.1 mg/dL (ref 0.0–0.3)
BILIRUBIN TOTAL: 0.3 mg/dL (ref 0.2–1.2)
Indirect Bilirubin: 0.2 mg/dL (ref 0.2–1.2)
Total Protein: 6.7 g/dL (ref 6.0–8.3)

## 2013-10-22 LAB — TSH
TSH: 0.996 u[IU]/mL (ref 0.350–4.500)
TSH: 1.012 u[IU]/mL (ref 0.350–4.500)

## 2013-10-23 ENCOUNTER — Encounter: Payer: Self-pay | Admitting: Family Medicine

## 2013-10-23 NOTE — Assessment & Plan Note (Signed)
Improved on recheck. Well controlled, no changes to meds. Encouraged heart healthy diet such as the DASH diet and exercise as tolerated.  

## 2013-10-23 NOTE — Assessment & Plan Note (Signed)
Persistent but tolerable, maintain activity as tolerated

## 2013-10-23 NOTE — Assessment & Plan Note (Signed)
Encouraged complete cessation. Discussed need to quit as relates to risk of numerous cancers, cardiac and pulmonary disease as well as neurologic complications. Counseled for greater than 3 minutes

## 2013-10-23 NOTE — Assessment & Plan Note (Signed)
Discussed possibility of medications to manage but thus far he does not think his symptoms warrant it

## 2013-10-23 NOTE — Progress Notes (Signed)
Patient ID: Ian Gonzales, male   DOB: Jul 06, 1940, 73 y.o.   MRN: [id number] Ian Gonzales [id number] 1940-07-22 10/23/2013      Progress Note-Follow Up  Subjective  Chief Complaint  Chief Complaint  Patient presents with  . Follow-up    5 week  . Injections    flu    HPI  Patient is a 73 year old male in today for routine medical care. In today for followup and frustrated with his ongoing pain. His back pain and radicular symptoms are somewhat improved after steroid injections and ortho treatment but he continues to system peripheral neuropathy in her. We and malaise and acknowledges low mood. Denies suicidal ideation struggles with fatigue. Well controlled, no changes to meds. Encouraged heart healthy diet such as the DASH diet and exercise as tolerated.   Past Medical History  Diagnosis Date  . Hypertension   . GERD (gastroesophageal reflux disease)   . Colon polyps   . Back pain, lumbosacral   . Sinusitis acute 03/24/2011  . Memory loss 04/16/2011  . Allergic state 05/30/2011  . Leukocytosis 12/15/2011  . Hyperlipidemia 02/15/2012  . Unspecified sinusitis (chronic) 04/05/2012  . Epicondylitis 10/10/2012  . Shortness of breath     WITH EXERTION AND BACK PAIN  . Wound drainage     L ELBOW  . Spinal stenosis, lumbar   . Nocturia     Past Surgical History  Procedure Laterality Date  . Lumbar laminectomy  1974    twice  . Urethral stricture dilatation    . Colonoscopy    . Penile prosthesis implant    . Cataracts      REMOVED  . Decompressive lumbar laminectomy level 2 N/A 11/26/2012    Procedure: CENTRAL DECOMPRESSION LUMBAR LAMINECTOMY L3-L4,  L4-L5   ;  Surgeon: Tobi Bastos, MD;  Location: [facility];  Service: Orthopedics;  Laterality: N/A;    History reviewed. No pertinent family history.  History   Social History  . Marital Status: Single    Spouse Name: N/A    Number of Children: N/A  . Years of Education: N/A   Occupational History  . Not on file.    Social History Main Topics  . Smoking status: Current Every Day Smoker -- 2.00 packs/day    Types: Cigarettes  . Smokeless tobacco: Never Used  . Alcohol Use: Yes     Comment: once in a while   . Drug Use: No  . Sexual Activity: No   Other Topics Concern  . Not on file   Social History Narrative  . No narrative on file    Current Outpatient Prescriptions on File Prior to Visit  Medication Sig Dispense Refill  . acetaminophen (TYLENOL) 325 MG tablet Take 650 mg by mouth every morning.      Marland Kitchen amLODipine (NORVASC) 5 MG tablet take 1 tablet by mouth once daily for high blood pressure  30 tablet  6  . aspirin 81 MG tablet Take 81 mg by mouth daily.      . naproxen sodium (ALEVE) 220 MG tablet Take 440 mg by mouth every morning.      . ranitidine (ZANTAC) 300 MG tablet take 1 tablet by mouth at bedtime  30 tablet  5   No current facility-administered medications on file prior to visit.    Allergies  Allergen Reactions  . Gabapentin     disequilibrium    Review of Systems  Review of Systems  Constitutional: Negative for fever  and malaise/fatigue.  HENT: Negative for congestion.   Eyes: Negative for discharge.  Respiratory: Negative for shortness of breath.   Cardiovascular: Negative for chest pain, palpitations and leg swelling.  Gastrointestinal: Negative for nausea, abdominal pain and diarrhea.  Genitourinary: Negative for dysuria.  Musculoskeletal: Positive for back pain and joint pain. Negative for falls.  Skin: Negative for rash.  Neurological: Negative for loss of consciousness and headaches.  Endo/Heme/Allergies: Negative for polydipsia.  Psychiatric/Behavioral: Negative for depression and suicidal ideas. The patient is not nervous/anxious and does not have insomnia.     Objective  BP 177/84  Pulse 97  Temp(Src) 98.5 F (36.9 C) (Oral)  Ht 5' 9.5" (1.765 m)  Wt 195 lb (88.451 kg)  BMI 28.39 kg/m2  SpO2 97%  Physical Exam  Physical Exam   Constitutional: He is oriented to person, place, and time and well-developed, well-nourished, and in no distress. No distress.  HENT:  Head: Normocephalic and atraumatic.  Eyes: Conjunctivae are normal.  Neck: Neck supple. No thyromegaly present.  Cardiovascular: Normal rate, regular rhythm and normal heart sounds.   No murmur heard. Pulmonary/Chest: Effort normal and breath sounds normal. No respiratory distress.  Abdominal: He exhibits no distension and no mass. There is no tenderness.  Musculoskeletal: He exhibits no edema.  Neurological: He is alert and oriented to person, place, and time.  Skin: Skin is warm.  Psychiatric: Memory, affect and judgment normal.    Lab Results  Component Value Date   TSH 0.996 10/21/2013   TSH 1.012 10/21/2013   Lab Results  Component Value Date   WBC 9.4 10/21/2013   HGB 13.7 10/21/2013   HCT 40.8 10/21/2013   MCV 92.7 10/21/2013   PLT 325 10/21/2013   Lab Results  Component Value Date   CREATININE 0.91 10/21/2013   BUN 20 10/21/2013   NA 136 10/21/2013   K 4.3 10/21/2013   CL 106 10/21/2013   CO2 19 10/21/2013   Lab Results  Component Value Date   ALT 18 10/21/2013   AST 17 10/21/2013   ALKPHOS 81 10/21/2013   BILITOT 0.3 10/21/2013   Lab Results  Component Value Date   CHOL 165 10/21/2013   Lab Results  Component Value Date   HDL 33* 10/21/2013   Lab Results  Component Value Date   LDLCALC 82 10/21/2013   Lab Results  Component Value Date   TRIG 250* 10/21/2013   Lab Results  Component Value Date   CHOLHDL 5.0 10/21/2013     Assessment & Plan  HYPERTENSION Improved on recheck. Well controlled, no changes to meds. Encouraged heart healthy diet such as the DASH diet and exercise as tolerated.   TOBACCO USER Encouraged complete cessation. Discussed need to quit as relates to risk of numerous cancers, cardiac and pulmonary disease as well as neurologic complications. Counseled for greater than 3 minutes  Unspecified hereditary  and idiopathic peripheral neuropathy Discussed possibility of medications to manage but thus far he does not think his symptoms warrant it  GERD Avoid offending foods, start probiotics. Do not eat large meals in late evening and consider raising head of bed.   Hyperlipidemia Encouraged heart healthy diet, increase exercise, avoid trans fats, consider a krill oil cap daily  Back pain, lumbosacral Persistent but tolerable, maintain activity as tolerated

## 2013-10-23 NOTE — Assessment & Plan Note (Signed)
Encouraged heart healthy diet, increase exercise, avoid trans fats, consider a krill oil cap daily 

## 2013-10-23 NOTE — Assessment & Plan Note (Signed)
Avoid offending foods, start probiotics. Do not eat large meals in late evening and consider raising head of bed.  

## 2013-12-21 ENCOUNTER — Other Ambulatory Visit: Payer: Medicare Other

## 2014-01-03 ENCOUNTER — Ambulatory Visit (INDEPENDENT_AMBULATORY_CARE_PROVIDER_SITE_OTHER): Payer: Medicare Other | Admitting: Family Medicine

## 2014-01-03 ENCOUNTER — Encounter: Payer: Self-pay | Admitting: Family Medicine

## 2014-01-03 VITALS — BP 140/78 | HR 99 | Temp 98.4°F | Ht 69.5 in | Wt 188.4 lb

## 2014-01-03 DIAGNOSIS — J449 Chronic obstructive pulmonary disease, unspecified: Secondary | ICD-10-CM

## 2014-01-03 DIAGNOSIS — F17200 Nicotine dependence, unspecified, uncomplicated: Secondary | ICD-10-CM

## 2014-01-03 DIAGNOSIS — Z720 Tobacco use: Secondary | ICD-10-CM

## 2014-01-03 DIAGNOSIS — I10 Essential (primary) hypertension: Secondary | ICD-10-CM

## 2014-01-03 DIAGNOSIS — K219 Gastro-esophageal reflux disease without esophagitis: Secondary | ICD-10-CM

## 2014-01-03 MED ORDER — AMLODIPINE BESYLATE 5 MG PO TABS: 2.5000 mg | ORAL_TABLET | Freq: Two times a day (BID) | ORAL | Status: DC

## 2014-01-03 NOTE — Patient Instructions (Signed)
Tums or mylanta    Gastroesophageal Reflux Disease, Adult Gastroesophageal reflux disease (GERD) happens when acid from your stomach flows up into the esophagus. When acid comes in contact with the esophagus, the acid causes soreness (inflammation) in the esophagus. Over time, GERD may create small holes (ulcers) in the lining of the esophagus. CAUSES   Increased body weight. This puts pressure on the stomach, making acid rise from the stomach into the esophagus.  Smoking. This increases acid production in the stomach.  Drinking alcohol. This causes decreased pressure in the lower esophageal sphincter (valve or ring of muscle between the esophagus and stomach), allowing acid from the stomach into the esophagus.  Late evening meals and a full stomach. This increases pressure and acid production in the stomach.  A malformed lower esophageal sphincter. Sometimes, no cause is found. SYMPTOMS   Burning pain in the lower part of the mid-chest behind the breastbone and in the mid-stomach area. This may occur twice a week or more often.  Trouble swallowing.  Sore throat.  Dry cough.  Asthma-like symptoms including chest tightness, shortness of breath, or wheezing. DIAGNOSIS  Your caregiver may be able to diagnose GERD based on your symptoms. In some cases, X-rays and other tests may be done to check for complications or to check the condition of your stomach and esophagus. TREATMENT  Your caregiver may recommend over-the-counter or prescription medicines to help decrease acid production. Ask your caregiver before starting or adding any new medicines.  HOME CARE INSTRUCTIONS   Change the factors that you can control. Ask your caregiver for guidance concerning weight loss, quitting smoking, and alcohol consumption.  Avoid foods and drinks that make your symptoms worse, such as:  Caffeine or alcoholic drinks.  Chocolate.  Peppermint or mint flavorings.  Garlic and onions.  Spicy  foods.  Citrus fruits, such as oranges, lemons, or limes.  Tomato-based foods such as sauce, chili, salsa, and pizza.  Fried and fatty foods.  Avoid lying down for the 3 hours prior to your bedtime or prior to taking a nap.  Eat small, frequent meals instead of large meals.  Wear loose-fitting clothing. Do not wear anything tight around your waist that causes pressure on your stomach.  Raise the head of your bed 6 to 8 inches with wood blocks to help you sleep. Extra pillows will not help.  Only take over-the-counter or prescription medicines for pain, discomfort, or fever as directed by your caregiver.  Do not take aspirin, ibuprofen, or other nonsteroidal anti-inflammatory drugs (NSAIDs). SEEK IMMEDIATE MEDICAL CARE IF:   You have pain in your arms, neck, jaw, teeth, or back.  Your pain increases or changes in intensity or duration.  You develop nausea, vomiting, or sweating (diaphoresis).  You develop shortness of breath, or you faint.  Your vomit is green, yellow, black, or looks like coffee grounds or blood.  Your stool is red, bloody, or black. These symptoms could be signs of other problems, such as heart disease, gastric bleeding, or esophageal bleeding. MAKE SURE YOU:   Understand these instructions.  Will watch your condition.  Will get help right away if you are not doing well or get worse. Document Released: 11/06/2004 Document Revised: 04/21/2011 Document Reviewed: 08/16/2010 [facility] Patient Information 2015 [place], [place]. This information is not intended to replace advice given to you by your health care provider. Make sure you discuss any questions you have with your health care provider.

## 2014-01-03 NOTE — Progress Notes (Signed)
Pre visit review using our clinic review tool, if applicable. No additional management support is needed unless otherwise documented below in the visit note. 

## 2014-01-09 ENCOUNTER — Encounter: Payer: Self-pay | Admitting: Family Medicine

## 2014-01-09 NOTE — Assessment & Plan Note (Signed)
Encouraged complete cessation. Discussed need to quit as relates to risk of numerous cancers, cardiac and pulmonary disease as well as neurologic complications. Counseled for greater than 3 minutes

## 2014-01-09 NOTE — Assessment & Plan Note (Signed)
Improved on recheck. Well controlled, no changes to meds. Encouraged heart healthy diet such as the DASH diet and exercise as tolerated.  

## 2014-01-09 NOTE — Assessment & Plan Note (Signed)
Avoid offending foods, start probiotics. Do not eat large meals in late evening and consider raising head of bed.  

## 2014-01-09 NOTE — Progress Notes (Signed)
Ian Gonzales  [id number] [birth year] 01/09/2014      Progress Note-Follow Up  Subjective  Chief Complaint  Chief Complaint  Patient presents with  . Follow-up    HPI  Patient is a 73 y.o. male in today for routine medical care. Patient is in today for follow-up to continue sister with fatigue and back pain but he said no recent illness. He denies any increased difficulty with his breathing. His had no recent falls or injury. He continues to struggle some low-grade depression but declines medications. No suicidal ideation. Heartburn generally well controlled recently. Denies CP/palp/SOB/HA/congestion/fevers/GI or GU c/o. Taking meds as prescribed  Past Medical History  Diagnosis Date  . Hypertension   . GERD (gastroesophageal reflux disease)   . Colon polyps   . Back pain, lumbosacral   . Sinusitis acute 03/24/2011  . Memory loss 04/16/2011  . Allergic state 05/30/2011  . Leukocytosis 12/15/2011  . Hyperlipidemia 02/15/2012  . Unspecified sinusitis (chronic) 04/05/2012  . Epicondylitis 10/10/2012  . Shortness of breath     WITH EXERTION AND BACK PAIN  . Wound drainage     L ELBOW  . Spinal stenosis, lumbar   . Nocturia     Past Surgical History  Procedure Laterality Date  . Lumbar laminectomy  1974    twice  . Urethral stricture dilatation    . Colonoscopy    . Penile prosthesis implant    . Cataracts      REMOVED  . Decompressive lumbar laminectomy level 2 N/A 11/26/2012    Procedure: CENTRAL DECOMPRESSION LUMBAR LAMINECTOMY L3-L4,  L4-L5   ;  Surgeon: Tobi Bastos, MD;  Location: [facility];  Service: Orthopedics;  Laterality: N/A;    History reviewed. No pertinent family history.  History   Social History  . Marital Status: Single    Spouse Name: N/A    Number of Children: N/A  . Years of Education: N/A   Occupational History  . Not on file.   Social History Main Topics  . Smoking status: Current Every Day Smoker -- 2.00 packs/day    Types:  Cigarettes  . Smokeless tobacco: Never Used  . Alcohol Use: Yes     Comment: once in a while   . Drug Use: No  . Sexual Activity: No   Other Topics Concern  . Not on file   Social History Narrative    Current Outpatient Prescriptions on File Prior to Visit  Medication Sig Dispense Refill  . acetaminophen (TYLENOL) 325 MG tablet Take 650 mg by mouth every morning.    Marland Kitchen aspirin 81 MG tablet Take 81 mg by mouth daily.    . naproxen sodium (ALEVE) 220 MG tablet Take 440 mg by mouth every morning.    Marland Kitchen PROAIR HFA 108 (90 BASE) MCG/ACT inhaler     . ranitidine (ZANTAC) 300 MG tablet take 1 tablet by mouth at bedtime 30 tablet 5   No current facility-administered medications on file prior to visit.    Allergies  Allergen Reactions  . Gabapentin     disequilibrium    Review of Systems  Review of Systems  Constitutional: Positive for malaise/fatigue. Negative for fever.  HENT: Negative for congestion.   Eyes: Negative for discharge.  Respiratory: Negative for shortness of breath.   Cardiovascular: Negative for chest pain, palpitations and leg swelling.  Gastrointestinal: Negative for nausea, abdominal pain and diarrhea.  Genitourinary: Negative for dysuria.  Musculoskeletal: Positive for back pain. Negative for falls.  Skin:  Negative for rash.  Neurological: Negative for loss of consciousness and headaches.  Endo/Heme/Allergies: Negative for polydipsia.  Psychiatric/Behavioral: Negative for depression and suicidal ideas. The patient is not nervous/anxious and does not have insomnia.     Objective  BP 140/78 mmHg  Pulse 99  Temp(Src) 98.4 F (36.9 C) (Oral)  Ht 5' 9.5" (1.765 m)  Wt 188 lb 6.4 oz (85.458 kg)  BMI 27.43 kg/m2  SpO2 100%  Physical Exam  Physical Exam  Constitutional: He is oriented to person, place, and time and well-developed, well-nourished, and in no distress. No distress.  HENT:  Head: Normocephalic and atraumatic.  Eyes: Conjunctivae are  normal.  Neck: Neck supple. No thyromegaly present.  Cardiovascular: Normal rate, regular rhythm and normal heart sounds.   No murmur heard. Pulmonary/Chest: Effort normal and breath sounds normal. No respiratory distress.  Abdominal: He exhibits no distension and no mass. There is no tenderness.  Musculoskeletal: He exhibits no edema.  Neurological: He is alert and oriented to person, place, and time.  Skin: Skin is warm.  Psychiatric: Memory, affect and judgment normal.    Lab Results  Component Value Date   TSH 0.996 10/21/2013   TSH 1.012 10/21/2013   Lab Results  Component Value Date   WBC 9.4 10/21/2013   HGB 13.7 10/21/2013   HCT 40.8 10/21/2013   MCV 92.7 10/21/2013   PLT 325 10/21/2013   Lab Results  Component Value Date   CREATININE 0.91 10/21/2013   BUN 20 10/21/2013   NA 136 10/21/2013   K 4.3 10/21/2013   CL 106 10/21/2013   CO2 19 10/21/2013   Lab Results  Component Value Date   ALT 18 10/21/2013   AST 17 10/21/2013   ALKPHOS 81 10/21/2013   BILITOT 0.3 10/21/2013   Lab Results  Component Value Date   CHOL 165 10/21/2013   Lab Results  Component Value Date   HDL 33* 10/21/2013   Lab Results  Component Value Date   LDLCALC 82 10/21/2013   Lab Results  Component Value Date   TRIG 250* 10/21/2013   Lab Results  Component Value Date   CHOLHDL 5.0 10/21/2013     Assessment & Plan  Essential hypertension Improved on recheck. Well controlled, no changes to meds. Encouraged heart healthy diet such as the DASH diet and exercise as tolerated.   COPD, severity to be determined No recent exacerbation  GERD Avoid offending foods, start probiotics. Do not eat large meals in late evening and consider raising head of bed.   TOBACCO USER Encouraged complete cessation. Discussed need to quit as relates to risk of numerous cancers, cardiac and pulmonary disease as well as neurologic complications. Counseled for greater than 3 minutes

## 2014-01-09 NOTE — Assessment & Plan Note (Signed)
No recent exacerbation 

## 2014-01-18 ENCOUNTER — Telehealth: Payer: Self-pay | Admitting: *Deleted

## 2014-01-18 DIAGNOSIS — R739 Hyperglycemia, unspecified: Secondary | ICD-10-CM

## 2014-01-18 DIAGNOSIS — I10 Essential (primary) hypertension: Secondary | ICD-10-CM

## 2014-01-18 DIAGNOSIS — E785 Hyperlipidemia, unspecified: Secondary | ICD-10-CM

## 2014-01-18 NOTE — Telephone Encounter (Signed)
Called and spoke with the Ian Gonzales and informed him that Dr. Charlett Blake would like for him to come in and have his blood work done either mid Dec or mid Jan.  Ian Gonzales stated he can come in Jan.  Ian Gonzales was scheduled for lab appt for (02-21-14).  New lab ordered and sent.//AB/CMA

## 2014-02-21 ENCOUNTER — Other Ambulatory Visit (INDEPENDENT_AMBULATORY_CARE_PROVIDER_SITE_OTHER): Payer: Medicare Other

## 2014-02-21 DIAGNOSIS — E785 Hyperlipidemia, unspecified: Secondary | ICD-10-CM

## 2014-02-21 DIAGNOSIS — I10 Essential (primary) hypertension: Secondary | ICD-10-CM

## 2014-02-21 DIAGNOSIS — R739 Hyperglycemia, unspecified: Secondary | ICD-10-CM

## 2014-02-21 LAB — CBC WITH DIFFERENTIAL/PLATELET
BASOS ABS: 0 10*3/uL (ref 0.0–0.1)
Basophils Relative: 0.3 % (ref 0.0–3.0)
Eosinophils Absolute: 0.1 10*3/uL (ref 0.0–0.7)
Eosinophils Relative: 0.6 % (ref 0.0–5.0)
HEMATOCRIT: 45.2 % (ref 39.0–52.0)
HEMOGLOBIN: 14.9 g/dL (ref 13.0–17.0)
LYMPHS PCT: 22.6 % (ref 12.0–46.0)
Lymphs Abs: 2.8 10*3/uL (ref 0.7–4.0)
MCHC: 33 g/dL (ref 30.0–36.0)
MCV: 94.3 fl (ref 78.0–100.0)
Monocytes Absolute: 1 10*3/uL (ref 0.1–1.0)
Monocytes Relative: 8.6 % (ref 3.0–12.0)
Neutro Abs: 8.3 10*3/uL — ABNORMAL HIGH (ref 1.4–7.7)
Neutrophils Relative %: 67.9 % (ref 43.0–77.0)
Platelets: 385 10*3/uL (ref 150.0–400.0)
RBC: 4.79 Mil/uL (ref 4.22–5.81)
RDW: 14.4 % (ref 11.5–15.5)
WBC: 12.2 10*3/uL — ABNORMAL HIGH (ref 4.0–10.5)

## 2014-02-21 LAB — HEPATIC FUNCTION PANEL
ALT: 18 U/L (ref 0–53)
AST: 23 U/L (ref 0–37)
Albumin: 4.5 g/dL (ref 3.5–5.2)
Alkaline Phosphatase: 91 U/L (ref 39–117)
BILIRUBIN DIRECT: 0 mg/dL (ref 0.0–0.3)
BILIRUBIN TOTAL: 0.6 mg/dL (ref 0.2–1.2)
Total Protein: 7.9 g/dL (ref 6.0–8.3)

## 2014-02-21 LAB — LIPID PANEL
Cholesterol: 203 mg/dL — ABNORMAL HIGH (ref 0–200)
HDL: 43.6 mg/dL (ref >39.00)
LDL CALC: 144 mg/dL — AB (ref 0–99)
NONHDL: 159.4
TRIGLYCERIDES: 77 mg/dL (ref 0.0–149.0)
Total CHOL/HDL Ratio: 5
VLDL: 15.4 mg/dL (ref 0.0–40.0)

## 2014-02-21 LAB — RENAL FUNCTION PANEL
ALBUMIN: 4.5 g/dL (ref 3.5–5.2)
BUN: 20 mg/dL (ref 6–23)
CALCIUM: 9.7 mg/dL (ref 8.4–10.5)
CO2: 23 meq/L (ref 19–32)
CREATININE: 1 mg/dL (ref 0.4–1.5)
Chloride: 103 mEq/L (ref 96–112)
GFR: 75.5 mL/min (ref >60.00)
GLUCOSE: 116 mg/dL — AB (ref 70–99)
Phosphorus: 2.9 mg/dL (ref 2.3–4.6)
Potassium: 5.4 mEq/L — ABNORMAL HIGH (ref 3.5–5.1)
Sodium: 137 mEq/L (ref 135–145)

## 2014-02-21 LAB — HEMOGLOBIN A1C: HEMOGLOBIN A1C: 6.1 % (ref 4.6–6.5)

## 2014-02-21 LAB — TSH: TSH: 2.1 u[IU]/mL (ref 0.35–4.50)

## 2014-02-27 ENCOUNTER — Encounter: Payer: Self-pay | Admitting: General Practice

## 2014-03-14 ENCOUNTER — Other Ambulatory Visit: Payer: Self-pay | Admitting: Family Medicine

## 2014-03-15 NOTE — Telephone Encounter (Signed)
Medication Detail      Disp Refills Start End     ranitidine (ZANTAC) 300 MG tablet 30 tablet 5 07/15/2013     Sig: take 1 tablet by mouth at bedtime    E-Prescribing Status: Receipt confirmed by pharmacy (07/15/2013 1:49 PM EDT)      Rx request to pharmacy/SLS

## 2014-04-10 ENCOUNTER — Telehealth: Payer: Self-pay

## 2014-04-10 ENCOUNTER — Emergency Department (HOSPITAL_BASED_OUTPATIENT_CLINIC_OR_DEPARTMENT_OTHER)
Admission: EM | Admit: 2014-04-10 | Discharge: 2014-04-10 | Disposition: A | Discharge status: Home / Self Care | Payer: Medicare Other | Attending: Emergency Medicine | Admitting: Emergency Medicine

## 2014-04-10 ENCOUNTER — Encounter (HOSPITAL_BASED_OUTPATIENT_CLINIC_OR_DEPARTMENT_OTHER): Payer: Self-pay | Admitting: Emergency Medicine

## 2014-04-10 ENCOUNTER — Emergency Department (HOSPITAL_BASED_OUTPATIENT_CLINIC_OR_DEPARTMENT_OTHER): Payer: Medicare Other

## 2014-04-10 DIAGNOSIS — I10 Essential (primary) hypertension: Secondary | ICD-10-CM | POA: Diagnosis not present

## 2014-04-10 DIAGNOSIS — Z7982 Long term (current) use of aspirin: Secondary | ICD-10-CM | POA: Insufficient documentation

## 2014-04-10 DIAGNOSIS — Z720 Tobacco use: Secondary | ICD-10-CM | POA: Insufficient documentation

## 2014-04-10 DIAGNOSIS — Z8639 Personal history of other endocrine, nutritional and metabolic disease: Secondary | ICD-10-CM | POA: Insufficient documentation

## 2014-04-10 DIAGNOSIS — J90 Pleural effusion, not elsewhere classified: Secondary | ICD-10-CM | POA: Insufficient documentation

## 2014-04-10 DIAGNOSIS — K219 Gastro-esophageal reflux disease without esophagitis: Secondary | ICD-10-CM | POA: Diagnosis not present

## 2014-04-10 DIAGNOSIS — Z862 Personal history of diseases of the blood and blood-forming organs and certain disorders involving the immune mechanism: Secondary | ICD-10-CM | POA: Insufficient documentation

## 2014-04-10 DIAGNOSIS — Z8739 Personal history of other diseases of the musculoskeletal system and connective tissue: Secondary | ICD-10-CM | POA: Insufficient documentation

## 2014-04-10 DIAGNOSIS — Z79899 Other long term (current) drug therapy: Secondary | ICD-10-CM | POA: Diagnosis not present

## 2014-04-10 DIAGNOSIS — Z791 Long term (current) use of non-steroidal anti-inflammatories (NSAID): Secondary | ICD-10-CM | POA: Diagnosis not present

## 2014-04-10 DIAGNOSIS — Z86010 Personal history of colonic polyps: Secondary | ICD-10-CM | POA: Diagnosis not present

## 2014-04-10 DIAGNOSIS — R079 Chest pain, unspecified: Secondary | ICD-10-CM

## 2014-04-10 LAB — BASIC METABOLIC PANEL
ANION GAP: 5 (ref 5–15)
BUN: 22 mg/dL (ref 6–23)
CHLORIDE: 108 mmol/L (ref 96–112)
CO2: 22 mmol/L (ref 19–32)
Calcium: 8.8 mg/dL (ref 8.4–10.5)
Creatinine, Ser: 0.93 mg/dL (ref 0.50–1.35)
GFR calc Af Amer: >90 mL/min (ref >90)
GFR calc non Af Amer: 80 mL/min — ABNORMAL LOW (ref >90)
Glucose, Bld: 118 mg/dL — ABNORMAL HIGH (ref 70–99)
POTASSIUM: 3.6 mmol/L (ref 3.5–5.1)
SODIUM: 135 mmol/L (ref 135–145)

## 2014-04-10 LAB — CBC WITH DIFFERENTIAL/PLATELET
Basophils Absolute: 0 10*3/uL (ref 0.0–0.1)
Basophils Relative: 0 % (ref 0–1)
Eosinophils Absolute: 0.1 10*3/uL (ref 0.0–0.7)
Eosinophils Relative: 1 % (ref 0–5)
HEMATOCRIT: 42.3 % (ref 39.0–52.0)
HEMOGLOBIN: 14.2 g/dL (ref 13.0–17.0)
LYMPHS ABS: 2 10*3/uL (ref 0.7–4.0)
LYMPHS PCT: 19 % (ref 12–46)
MCH: 31.1 pg (ref 26.0–34.0)
MCHC: 33.6 g/dL (ref 30.0–36.0)
MCV: 92.6 fL (ref 78.0–100.0)
MONO ABS: 1.2 10*3/uL — AB (ref 0.1–1.0)
MONOS PCT: 11 % (ref 3–12)
NEUTROS ABS: 7.4 10*3/uL (ref 1.7–7.7)
Neutrophils Relative %: 69 % (ref 43–77)
Platelets: 322 10*3/uL (ref 150–400)
RBC: 4.57 MIL/uL (ref 4.22–5.81)
RDW: 14.2 % (ref 11.5–15.5)
WBC: 10.7 10*3/uL — AB (ref 4.0–10.5)

## 2014-04-10 LAB — TROPONIN I: Troponin I: <0.03 ng/mL (ref <0.031)

## 2014-04-10 MED ORDER — NITROGLYCERIN 0.4 MG SL SUBL
0.4000 mg | SUBLINGUAL_TABLET | SUBLINGUAL | Status: DC | PRN
  Administered 2014-04-10: 0.4 mg via SUBLINGUAL
  Filled 2014-04-10: qty 1

## 2014-04-10 MED ORDER — ASPIRIN 81 MG PO CHEW
324.0000 mg | CHEWABLE_TABLET | Freq: Once | ORAL | Status: AC
  Administered 2014-04-10: 324 mg via ORAL
  Filled 2014-04-10: qty 4

## 2014-04-10 NOTE — Discharge Instructions (Signed)
Ibuprofen 600 mg every 6 hours as needed for pain.  Follow-up with your primary Dr. in the next week, and return to the ER if your symptoms substantially worsen or change.   Chest Pain (Nonspecific) It is often hard to give a specific diagnosis for the cause of chest pain. There is always a chance that your pain could be related to something serious, such as a heart attack or a blood clot in the lungs. You need to follow up with your health care provider for further evaluation. CAUSES   Heartburn.  Pneumonia or bronchitis.  Anxiety or stress.  Inflammation around your heart (pericarditis) or lung (pleuritis or pleurisy).  A blood clot in the lung.  A collapsed lung (pneumothorax). It can develop suddenly on its own (spontaneous pneumothorax) or from trauma to the chest.  Shingles infection (herpes zoster virus). The chest wall is composed of bones, muscles, and cartilage. Any of these can be the source of the pain.  The bones can be bruised by injury.  The muscles or cartilage can be strained by coughing or overwork.  The cartilage can be affected by inflammation and become sore (costochondritis). DIAGNOSIS  Lab tests or other studies may be needed to find the cause of your pain. Your health care provider may have you take a test called an ambulatory electrocardiogram (ECG). An ECG records your heartbeat patterns over a 24-hour period. You may also have other tests, such as:  Transthoracic echocardiogram (TTE). During echocardiography, sound waves are used to evaluate how blood flows through your heart.  Transesophageal echocardiogram (TEE).  Cardiac monitoring. This allows your health care provider to monitor your heart rate and rhythm in real time.  Holter monitor. This is a portable device that records your heartbeat and can help diagnose heart arrhythmias. It allows your health care provider to track your heart activity for several days, if needed.  Stress tests by  exercise or by giving medicine that makes the heart beat faster. TREATMENT   Treatment depends on what may be causing your chest pain. Treatment may include:  Acid blockers for heartburn.  Anti-inflammatory medicine.  Pain medicine for inflammatory conditions.  Antibiotics if an infection is present.  You may be advised to change lifestyle habits. This includes stopping smoking and avoiding alcohol, caffeine, and chocolate.  You may be advised to keep your head raised (elevated) when sleeping. This reduces the chance of acid going backward from your stomach into your esophagus. Most of the time, nonspecific chest pain will improve within 2-3 days with rest and mild pain medicine.  HOME CARE INSTRUCTIONS   If antibiotics were prescribed, take them as directed. Finish them even if you start to feel better.  For the next few days, avoid physical activities that bring on chest pain. Continue physical activities as directed.  Do not use any tobacco products, including cigarettes, chewing tobacco, or electronic cigarettes.  Avoid drinking alcohol.  Only take medicine as directed by your health care provider.  Follow your health care provider's suggestions for further testing if your chest pain does not go away.  Keep any follow-up appointments you made. If you do not go to an appointment, you could develop lasting (chronic) problems with pain. If there is any problem keeping an appointment, call to reschedule. SEEK MEDICAL CARE IF:   Your chest pain does not go away, even after treatment.  You have a rash with blisters on your chest.  You have a fever. SEEK IMMEDIATE MEDICAL CARE IF:  You have increased chest pain or pain that spreads to your arm, neck, jaw, back, or abdomen.  You have shortness of breath.  You have an increasing cough, or you cough up blood.  You have severe back or abdominal pain.  You feel nauseous or vomit.  You have severe weakness.  You  faint.  You have chills. This is an emergency. Do not wait to see if the pain will go away. Get medical help at once. Call your local emergency services (911 in [place] Do not drive yourself to the hospital. MAKE SURE YOU:   Understand these instructions.  Will watch your condition.  Will get help right away if you are not doing well or get worse. Document Released: 11/06/2004 Document Revised: 02/01/2013 Document Reviewed: 09/02/2007 [facility] Patient Information 2015 [place], [place]. This information is not intended to replace advice given to you by your health care provider. Make sure you discuss any questions you have with your health care provider.

## 2014-04-10 NOTE — Telephone Encounter (Addendum)
C/o:  Pt presented to clinic with complaints of intermittent left side chest pain that radiates to left side of neck down left arm.  Rated 5/10.  Started 3 weeks and continues to persists.  Associated symptoms include occasional shortness of breath and increased heart rate, nausea, dizziness and blurred vision.  Pt is pleasant, calm, and does not appear to be in distress.    Hx. HTN, smoker, hyperlipidemia  VS: 98.0, 102, 158/90, 97% RA    Advice:  Pt escorted to [facility] ER for evaluation and treatment.

## 2014-04-10 NOTE — ED Notes (Signed)
Pt having intermittent chest pain radiating to left shoulder/arm and up left side of neck.  Some sob at times.  Some dizziness and nausea.  No diaphoresis.

## 2014-04-10 NOTE — ED Provider Notes (Signed)
CSN: [id number]     Arrival date & time 04/10/14  1030 History   First MD Initiated Contact with Patient 04/10/14 1035     Chief Complaint  Patient presents with  . Chest Pain     (Consider location/radiation/quality/duration/timing/severity/associated sxs/prior Treatment) HPI Comments: Patient is a 74 year old male who presents with complaints of chest discomfort that radiates to the left neck. This is been occurring intermittently for the past 3 weeks. These episodes, random and seemed to last about an hour. They are relieved with rest. He denies any exertional symptoms. He denies any fevers or chills. He denies any diaphoresis. He has no prior cardiac history but is on medication for cholesterol, blood pressure, and admits to smoking one pack per day.  Patient is a 74 y.o. male presenting with chest pain. The history is provided by the patient.  Chest Pain Pain location:  Substernal area Pain quality: sharp   Radiates to: Left side of neck. Pain radiates to the back: no   Pain severity:  Moderate Onset quality:  Gradual Duration:  3 weeks Timing:  Intermittent Progression:  Worsening Chronicity:  New Relieved by:  Nothing Worsened by:  Nothing tried Ineffective treatments:  None tried Associated symptoms: shortness of breath   Associated symptoms: no cough, no diaphoresis and no fever     Past Medical History  Diagnosis Date  . Hypertension   . GERD (gastroesophageal reflux disease)   . Colon polyps   . Back pain, lumbosacral   . Sinusitis acute 03/24/2011  . Memory loss 04/16/2011  . Allergic state 05/30/2011  . Leukocytosis 12/15/2011  . Hyperlipidemia 02/15/2012  . Unspecified sinusitis (chronic) 04/05/2012  . Epicondylitis 10/10/2012  . Shortness of breath     WITH EXERTION AND BACK PAIN  . Wound drainage     L ELBOW  . Spinal stenosis, lumbar   . Nocturia    Past Surgical History  Procedure Laterality Date  . Lumbar laminectomy  1974    twice  . Urethral  stricture dilatation    . Colonoscopy    . Penile prosthesis implant    . Cataracts      REMOVED  . Decompressive lumbar laminectomy level 2 N/A 11/26/2012    Procedure: CENTRAL DECOMPRESSION LUMBAR LAMINECTOMY L3-L4,  L4-L5   ;  Surgeon: Tobi Bastos, MD;  Location: [facility];  Service: Orthopedics;  Laterality: N/A;   No family history on file. History  Substance Use Topics  . Smoking status: Current Every Day Smoker -- 2.00 packs/day    Types: Cigarettes  . Smokeless tobacco: Never Used  . Alcohol Use: Yes     Comment: once in a while     Review of Systems  Constitutional: Negative for fever and diaphoresis.  Respiratory: Positive for shortness of breath. Negative for cough.   Cardiovascular: Positive for chest pain.  All other systems reviewed and are negative.     Allergies  Gabapentin  Home Medications   Prior to Admission medications   Medication Sig Start Date End Date Taking? Authorizing Provider  acetaminophen (TYLENOL) 325 MG tablet Take 650 mg by mouth every morning.    Historical Provider, MD  amLODipine (NORVASC) 5 MG tablet Take 0.5 tablets (2.5 mg total) by mouth 2 (two) times daily. for high blood pressure 01/03/14   Mosie Lukes, MD  aspirin 81 MG tablet Take 81 mg by mouth daily.    Historical Provider, MD  naproxen sodium (ALEVE) 220 MG tablet Take 440 mg by  mouth every morning.    Historical Provider, MD  PROAIR HFA 108 (90 BASE) MCG/ACT inhaler  09/16/13   Historical Provider, MD  ranitidine (ZANTAC) 300 MG tablet take 1 tablet by mouth at bedtime 03/15/14   Mosie Lukes, MD   BP 180/82 mmHg  Pulse 93  Temp(Src) 98.4 F (36.9 C) (Oral)  Resp 18  Ht 5\' 7"  (1.702 m)  Wt 185 lb (83.915 kg)  BMI 28.97 kg/m2  SpO2 97% Physical Exam  Constitutional: He is oriented to person, place, and time. He appears well-developed and well-nourished. No distress.  HENT:  Head: Normocephalic and atraumatic.  Mouth/Throat: Oropharynx is clear and moist.  Neck:  Normal range of motion. Neck supple.  Cardiovascular: Normal rate, regular rhythm and normal heart sounds.   No murmur heard. Pulmonary/Chest: Effort normal and breath sounds normal. No respiratory distress. He has no wheezes.  Abdominal: Soft. Bowel sounds are normal. He exhibits no distension. There is no tenderness.  Musculoskeletal: Normal range of motion. He exhibits no edema.  Lymphadenopathy:    He has no cervical adenopathy.  Neurological: He is alert and oriented to person, place, and time.  Skin: Skin is warm and dry. He is not diaphoretic.  Nursing note and vitals reviewed.   ED Course  Procedures (including critical care time) Labs Review Labs Reviewed - No data to display  Imaging Review No results found.   EKG Interpretation   Date/Time:  Monday April 10 2014 10:37:22 EST Ventricular Rate:  93 PR Interval:  172 QRS Duration: 146 QT Interval:  410 QTC Calculation: 509 R Axis:   68 Text Interpretation:  Normal sinus rhythm Right bundle branch block  Abnormal ECG Confirmed by DELOS  MD, Libertie Hausler ([id number]) on 04/10/2014 10:44:43  AM      MDM   Final diagnoses:  None    Patient is a 74 year old male who presents with a three-week history of intermittent chest discomfort. His symptoms are atypical and there are no EKG changes and troponin is negative. He will be discharged to home, to follow-up with his primary Dr.    Veryl Speak, MD 04/10/14 [phone]

## 2014-05-04 ENCOUNTER — Ambulatory Visit (INDEPENDENT_AMBULATORY_CARE_PROVIDER_SITE_OTHER): Payer: Medicare Other | Admitting: Family Medicine

## 2014-05-04 ENCOUNTER — Ambulatory Visit (HOSPITAL_BASED_OUTPATIENT_CLINIC_OR_DEPARTMENT_OTHER)
Admission: RE | Admit: 2014-05-04 | Discharge: 2014-05-04 | Disposition: A | Discharge status: Home / Self Care | Payer: Medicare Other | Source: Ambulatory Visit | Attending: Family Medicine | Admitting: Family Medicine

## 2014-05-04 ENCOUNTER — Encounter (HOSPITAL_BASED_OUTPATIENT_CLINIC_OR_DEPARTMENT_OTHER): Payer: Self-pay

## 2014-05-04 ENCOUNTER — Encounter: Payer: Self-pay | Admitting: Family Medicine

## 2014-05-04 VITALS — BP 152/92 | HR 86 | Temp 97.9°F | Resp 16 | Wt 186.0 lb

## 2014-05-04 DIAGNOSIS — J9811 Atelectasis: Secondary | ICD-10-CM | POA: Insufficient documentation

## 2014-05-04 DIAGNOSIS — R252 Cramp and spasm: Secondary | ICD-10-CM | POA: Diagnosis not present

## 2014-05-04 DIAGNOSIS — J90 Pleural effusion, not elsewhere classified: Secondary | ICD-10-CM

## 2014-05-04 DIAGNOSIS — R0602 Shortness of breath: Secondary | ICD-10-CM | POA: Diagnosis not present

## 2014-05-04 DIAGNOSIS — I2510 Atherosclerotic heart disease of native coronary artery without angina pectoris: Secondary | ICD-10-CM | POA: Diagnosis not present

## 2014-05-04 DIAGNOSIS — I6782 Cerebral ischemia: Secondary | ICD-10-CM | POA: Insufficient documentation

## 2014-05-04 DIAGNOSIS — F17200 Nicotine dependence, unspecified, uncomplicated: Secondary | ICD-10-CM

## 2014-05-04 DIAGNOSIS — R0789 Other chest pain: Secondary | ICD-10-CM

## 2014-05-04 DIAGNOSIS — R51 Headache: Secondary | ICD-10-CM | POA: Diagnosis present

## 2014-05-04 DIAGNOSIS — I358 Other nonrheumatic aortic valve disorders: Secondary | ICD-10-CM | POA: Insufficient documentation

## 2014-05-04 DIAGNOSIS — I10 Essential (primary) hypertension: Secondary | ICD-10-CM

## 2014-05-04 DIAGNOSIS — M542 Cervicalgia: Secondary | ICD-10-CM

## 2014-05-04 DIAGNOSIS — R938 Abnormal findings on diagnostic imaging of other specified body structures: Secondary | ICD-10-CM | POA: Insufficient documentation

## 2014-05-04 DIAGNOSIS — R519 Headache, unspecified: Secondary | ICD-10-CM

## 2014-05-04 LAB — CBC
HCT: 42.6 % (ref 39.0–52.0)
HEMOGLOBIN: 14.7 g/dL (ref 13.0–17.0)
MCH: 31.5 pg (ref 26.0–34.0)
MCHC: 34.5 g/dL (ref 30.0–36.0)
MCV: 91.4 fL (ref 78.0–100.0)
MPV: 9.2 fL (ref 8.6–12.4)
Platelets: 454 10*3/uL — ABNORMAL HIGH (ref 150–400)
RBC: 4.66 MIL/uL (ref 4.22–5.81)
RDW: 13.9 % (ref 11.5–15.5)
WBC: 14.3 10*3/uL — AB (ref 4.0–10.5)

## 2014-05-04 MED ORDER — IOHEXOL 300 MG/ML  SOLN
80.0000 mL | Freq: Once | INTRAMUSCULAR | Status: AC | PRN
  Administered 2014-05-04: 80 mL via INTRAVENOUS

## 2014-05-04 MED ORDER — AMLODIPINE BESYLATE 5 MG PO TABS: 5.0000 mg | ORAL_TABLET | Freq: Two times a day (BID) | ORAL | Status: DC

## 2014-05-04 NOTE — Progress Notes (Signed)
Pre visit review using our clinic review tool, if applicable. No additional management support is needed unless otherwise documented below in the visit note. 

## 2014-05-04 NOTE — Patient Instructions (Addendum)
Advil=Ibuprofen=Motrin  These are similar to Aleve=Naproxen  So do not take more than one of the above any given day,  Tylenol=Acetaminophen and it is different from the Advil so you can take these together or on the same  Consider Salon Pas patches or gel or aspercreme to neck twice daily   Pleural Effusion The lining covering your lungs and the inside of your chest is called the pleura. Usually, the space between the two pleura contains no air and only a thin layer of fluid. A pleural effusion is an abnormal buildup of fluid in the pleural space. Fluid gathers when there is increased pressure in the lung vessels. This forces fluids out of the lungs and into the pleural space. Vessels may also leak fluids when there are infections, such as pneumonia, or other causes of soreness and redness (inflammation). Fluids leak into the lungs when protein in the blood is low or when certain vessels (lymphatics) are blocked. Finding a pleural effusion is important because it is usually caused by another disease. In order to treat a pleural effusion, your health care provider needs to find its cause. If left untreated, a large amount of fluid can build up and cause collapse of the lung. CAUSES   Heart failure.  Infections (pneumonia, tuberculosis), pulmonary embolism, pulmonary infarction.  Cancer (primary lung and metastatic), asbestosis.  Liver failure (cirrhosis).  Nephrotic syndrome, peritoneal dialysis, kidney problems (uremia).  Collagen vascular disease (systemic lupus erythematosus, rheumatoid arthritis).  Injury (trauma) to the chest or rupture of the digestive tube (esophagus).  Material in the chest or pleural space (hemothorax, chylothorax).  Pancreatitis.  Surgery.  Drug reactions. SYMPTOMS  A pleural effusion can decrease the amount of space available for breathing and make you short of breath. The fluid can become infected, which may cause pain and fever. Often, the pain  is worse when taking a deep breath. The underlying disease (heart failure, pneumonia, blood clot, tuberculosis, cancer) may also cause symptoms. DIAGNOSIS   Your health care provider can usually tell what is wrong by talking to you (taking a history), doing an exam, and taking a routine X-ray. If the X-ray shows fluid in your chest, often fluid is removed from your chest with a needle for testing (diagnostic thoracentesis).  Sometimes, more specialized X-rays may be needed.  Sometimes, a small piece of tissue is removed and examined by a specialist (biopsy). TREATMENT  Treatment varies based on what caused the pleural effusion. Treatments include:  Removing as much fluid as possible using a needle (thoracentesis) to improve the cough and shortness of breath. This is a simple procedure that can be done at bedside. The risks are bleeding, infection, collapse of a lung, or low blood pressure.  Placing a tube in the chest to drain the effusion (tube thoracostomy). This is often used when there is an infection in the fluid. This is a simple procedure that can often be done at bedside or in a clinic. The procedure may be painful. The risks are the same as using a needle to drain the fluid. The chest tube usually remains for a few days and is connected to suction to improve fluid drainage. After placement, the tube usually does not cause much discomfort.  Surgical removal of fibrous debris in and around the pleural space (decortication). This may be done with a flexible telescope (thoracoscope) through a small or large cut (incision). This is helpful for patients who have fibrosis or scar tissue that prevents complete lung expansion. The  risks are infection, blood loss, and side effects from general anesthesia.  Sometimes, a procedure called pleurodesis is done. A chest tube is placed and the fluid is drained. Next, an agent (tetracycline, talc powder) is added to the pleural space. This causes the lung  and chest wall to stick together (adhesion). This leaves no potential space for fluid to build up. The risks include infection, blood loss, and side effects from general anesthesia.  If the effusion is caused by infection, it may be treated with antibiotics and may improve without draining. HOME CARE INSTRUCTIONS   Take any medicines exactly as prescribed.  Follow up with your health care provider as directed.  Monitor your exercise capacity (the amount of walking you can do before you get short of breath).  Do not use any tobacco products including cigarettes, chewing tobacco, or electronic cigarettes. SEEK MEDICAL CARE IF:   Your exercise capacity seems to get worse or does not improve with time.  You do not recover from your illness.  You have drainage, redness, swelling, or pain at any incision or puncture sites. SEEK IMMEDIATE MEDICAL CARE IF:   Shortness of breath or chest pain develops or gets worse.  You have a fever.  You develop a new cough, especially if the mucus (phlegm) is discolored. MAKE SURE YOU:   Understand these instructions.  Will watch your condition.  Will get help right away if you are not doing well or get worse. Document Released: 01/27/2005 Document Revised: 06/13/2013 Document Reviewed: 09/18/2006 [facility] Patient Information 2015 [place], [place]. This information is not intended to replace advice given to you by your health care provider. Make sure you discuss any questions you have with your health care provider.

## 2014-05-05 ENCOUNTER — Encounter: Payer: Self-pay | Admitting: Family Medicine

## 2014-05-05 DIAGNOSIS — J90 Pleural effusion, not elsewhere classified: Secondary | ICD-10-CM | POA: Insufficient documentation

## 2014-05-05 DIAGNOSIS — R519 Headache, unspecified: Secondary | ICD-10-CM

## 2014-05-05 DIAGNOSIS — R51 Headache: Secondary | ICD-10-CM

## 2014-05-05 HISTORY — DX: Headache, unspecified: R51.9

## 2014-05-05 LAB — COMPREHENSIVE METABOLIC PANEL
ALBUMIN: 4.3 g/dL (ref 3.5–5.2)
ALK PHOS: 99 U/L (ref 39–117)
ALT: 13 U/L (ref 0–53)
AST: 13 U/L (ref 0–37)
BUN: 17 mg/dL (ref 6–23)
CALCIUM: 9.3 mg/dL (ref 8.4–10.5)
CO2: 17 meq/L — AB (ref 19–32)
CREATININE: 0.77 mg/dL (ref 0.50–1.35)
Chloride: 103 mEq/L (ref 96–112)
GLUCOSE: 86 mg/dL (ref 70–99)
Potassium: 4.2 mEq/L (ref 3.5–5.3)
Sodium: 138 mEq/L (ref 135–145)
Total Bilirubin: 0.4 mg/dL (ref 0.2–1.2)
Total Protein: 7 g/dL (ref 6.0–8.3)

## 2014-05-05 LAB — MAGNESIUM: Magnesium: 2.1 mg/dL (ref 1.5–2.5)

## 2014-05-05 NOTE — Assessment & Plan Note (Signed)
With left sided neck pain. CT of head is only noted to have agree appropriate atrophy. Encouraged increased hydration, 64 ounces of clear fluids daily. Minimize alcohol and caffeine. Eat small frequent meals with lean proteins and complex carbs. Avoid high and low blood sugars. Get adequate sleep, 7-8 hours a night. Needs exercise daily preferably in the morning.

## 2014-05-05 NOTE — Assessment & Plan Note (Signed)
Encouraged complete cessation. Discussed need to quit as relates to risk of numerous cancers, cardiac and pulmonary disease as well as neurologic complications. Counseled for greater than 3 minutes

## 2014-05-05 NOTE — Assessment & Plan Note (Signed)
On left, worsening, CT scan ordered today confirms persistence and possibility of infection vs malignancy. Patient called on 3/25 and made aware of possibilities. He is feeling well and denies SOB, fevers or worsening cough. He is aware that he will be contacted when office reopens for urgent referral to pulmonology so we can hopefully tap fluids and provide him with an accurate diagnosis. In the meantime if his symptoms worsen or change he will need to present to the emergency department for further evaluation and care.

## 2014-05-05 NOTE — Assessment & Plan Note (Signed)
Still elevated on repeat will increase Amlodipine to 5 mg po bid and reassess

## 2014-05-05 NOTE — Progress Notes (Signed)
Ian Gonzales  [id number] December 04, 1940 05/05/2014      Progress Note-Follow Up  Subjective  Chief Complaint  Chief Complaint  Patient presents with  . Chest Pain  . Neck Pain  . Headache    HPI  Patient is a 74 y.o. male in today for routine medical care. He is in today for reevaluation of a pleural effusion that was recently discovered during an ER visit. He has had persistent Left sided chest discomfort. It is not worsening but has not resolved. He has SOB but it is also stable. He denies fevers, chills, hemoptysis. He does have some cough productive of clear to gray sputum at times. He does not have SOB at rest.  No palpitations, BI or GU complaints. He does note some left sided neck pain and intermittent headache. No photophobia, phonophobia, visual or hearing changes. No other neurologic complaints. His low back pain and right lower extremity symptoms are stable.   Past Medical History  Diagnosis Date  . Hypertension   . GERD (gastroesophageal reflux disease)   . Colon polyps   . Back pain, lumbosacral   . Sinusitis acute 03/24/2011  . Memory loss 04/16/2011  . Allergic state 05/30/2011  . Leukocytosis 12/15/2011  . Hyperlipidemia 02/15/2012  . Unspecified sinusitis (chronic) 04/05/2012  . Epicondylitis 10/10/2012  . Shortness of breath     WITH EXERTION AND BACK PAIN  . Wound drainage     L ELBOW  . Spinal stenosis, lumbar   . Nocturia     Past Surgical History  Procedure Laterality Date  . Lumbar laminectomy  1974    twice  . Urethral stricture dilatation    . Colonoscopy    . Penile prosthesis implant    . Cataracts      REMOVED  . Decompressive lumbar laminectomy level 2 N/A 11/26/2012    Procedure: CENTRAL DECOMPRESSION LUMBAR LAMINECTOMY L3-L4,  L4-L5   ;  Surgeon: Tobi Bastos, MD;  Location: [facility];  Service: Orthopedics;  Laterality: N/A;    History reviewed. No pertinent family history.  History   Social History  . Marital Status: Single   Spouse Name: N/A  . Number of Children: N/A  . Years of Education: N/A   Occupational History  . Not on file.   Social History Main Topics  . Smoking status: Current Every Day Smoker -- 2.00 packs/day    Types: Cigarettes  . Smokeless tobacco: Never Used  . Alcohol Use: Yes     Comment: once in a while   . Drug Use: No  . Sexual Activity: No   Other Topics Concern  . Not on file   Social History Narrative    Current Outpatient Prescriptions on File Prior to Visit  Medication Sig Dispense Refill  . acetaminophen (TYLENOL) 325 MG tablet Take 650 mg by mouth every morning.    Marland Kitchen aspirin 81 MG tablet Take 81 mg by mouth daily.    . naproxen sodium (ALEVE) 220 MG tablet Take 440 mg by mouth every morning.    . ranitidine (ZANTAC) 300 MG tablet take 1 tablet by mouth at bedtime 30 tablet 5  . PROAIR HFA 108 (90 BASE) MCG/ACT inhaler      No current facility-administered medications on file prior to visit.    Allergies  Allergen Reactions  . Gabapentin     disequilibrium  . Valacyclovir Other (See Comments)    constipation    Review of Systems  Review of Systems  Constitutional: Positive for malaise/fatigue. Negative for fever and chills.  HENT: Positive for congestion.   Eyes: Negative for discharge.  Respiratory: Positive for cough, sputum production and shortness of breath. Negative for wheezing.   Cardiovascular: Positive for chest pain. Negative for palpitations and leg swelling.  Gastrointestinal: Negative for nausea, abdominal pain and diarrhea.  Genitourinary: Negative for dysuria.  Musculoskeletal: Negative for falls.  Skin: Negative for rash.  Neurological: Positive for headaches. Negative for loss of consciousness.  Endo/Heme/Allergies: Negative for polydipsia.  Psychiatric/Behavioral: Negative for depression and suicidal ideas. The patient is not nervous/anxious and does not have insomnia.     Objective  BP 152/92 mmHg  Pulse 86  Temp(Src) 97.9 F  (36.6 C) (Oral)  Resp 16  Wt 186 lb (84.369 kg)  SpO2 96%  Physical Exam  Physical Exam  Constitutional: He is oriented to person, place, and time and well-developed, well-nourished, and in no distress. No distress.  HENT:  Head: Normocephalic and atraumatic.  Eyes: Conjunctivae are normal.  Neck: Neck supple. No thyromegaly present.  Cardiovascular: Normal rate, regular rhythm and normal heart sounds.   Pulmonary/Chest: Effort normal and breath sounds normal. No respiratory distress. He has no wheezes. He has no rales. He exhibits no tenderness.  Decreased breath sounds left lower lobe.   Abdominal: He exhibits no distension and no mass. There is no tenderness.  Musculoskeletal: He exhibits no edema.  Neurological: He is alert and oriented to person, place, and time.  Skin: Skin is warm.  Psychiatric: Memory, affect and judgment normal.    Lab Results  Component Value Date   TSH 2.10 02/21/2014   Lab Results  Component Value Date   WBC 14.3* 05/04/2014   HGB 14.7 05/04/2014   HCT 42.6 05/04/2014   MCV 91.4 05/04/2014   PLT 454* 05/04/2014   Lab Results  Component Value Date   CREATININE 0.77 05/04/2014   BUN 17 05/04/2014   NA 138 05/04/2014   K 4.2 05/04/2014   CL 103 05/04/2014   CO2 17* 05/04/2014   Lab Results  Component Value Date   ALT 13 05/04/2014   AST 13 05/04/2014   ALKPHOS 99 05/04/2014   BILITOT 0.4 05/04/2014   Lab Results  Component Value Date   CHOL 203* 02/21/2014   Lab Results  Component Value Date   HDL 43.60 02/21/2014   Lab Results  Component Value Date   LDLCALC 144* 02/21/2014   Lab Results  Component Value Date   TRIG 77.0 02/21/2014   Lab Results  Component Value Date   CHOLHDL 5 02/21/2014     Assessment & Plan  Essential hypertension Still elevated on repeat will increase Amlodipine to 5 mg po bid and reassess   TOBACCO USER Encouraged complete cessation. Discussed need to quit as relates to risk of numerous  cancers, cardiac and pulmonary disease as well as neurologic complications. Counseled for greater than 3 minutes   Pleural effusion On left, worsening, CT scan ordered today confirms persistence and possibility of infection vs malignancy. Patient called on 3/25 and made aware of possibilities. He is feeling well and denies SOB, fevers or worsening cough. He is aware that he will be contacted when office reopens for urgent referral to pulmonology so we can hopefully tap fluids and provide him with an accurate diagnosis. In the meantime if his symptoms worsen or change he will need to present to the emergency department for further evaluation and care.    Headache With left sided neck  pain. CT of head is only noted to have agree appropriate atrophy. Encouraged increased hydration, 64 ounces of clear fluids daily. Minimize alcohol and caffeine. Eat small frequent meals with lean proteins and complex carbs. Avoid high and low blood sugars. Get adequate sleep, 7-8 hours a night. Needs exercise daily preferably in the morning.

## 2014-05-09 ENCOUNTER — Encounter: Payer: Self-pay | Admitting: Pulmonary Disease

## 2014-05-09 ENCOUNTER — Ambulatory Visit (INDEPENDENT_AMBULATORY_CARE_PROVIDER_SITE_OTHER): Payer: Medicare Other | Admitting: Pulmonary Disease

## 2014-05-09 VITALS — BP 166/88 | HR 94 | Temp 97.0°F | Ht 66.0 in | Wt 184.6 lb

## 2014-05-09 DIAGNOSIS — J90 Pleural effusion, not elsewhere classified: Secondary | ICD-10-CM

## 2014-05-09 NOTE — Assessment & Plan Note (Signed)
The patient has a large left pleural effusion with left lower lobe atelectasis and some nodularity of the pleural surface. There is no obvious etiology based on his history, and I am concerned this may represent a malignant process. He has no history of a chest infection, trauma, and nothing to suggest congestive heart failure. At this point, he is going to need an ultrasound-guided thoracentesis with appropriate analysis of the fluid. The patient is agreeable to this approach.

## 2014-05-09 NOTE — Progress Notes (Signed)
   Subjective:    Patient ID: Ian Gonzales, male    DOB: 09-Jan-1941, 74 y.o.   MRN: [id number]  HPI The patient is a very pleasant 73 year old male who I've been asked to see for a new left pleural effusion. The end of last year he started having anterior chest discomfort that could be sharp or dull at times. This gradually progressed in severity, and ultimately went to the emergency room the end of last month which showed a moderate left effusion. He was asked to follow-up with his primary care physician, and ultimately had a CT chest the end of March that showed a very large effusion with some nodular pleural thickening. He had atelectasis of his left lower lobe, and minimal mediastinal adenopathy.  The patient does have a long history of smoking for 63 years, and currently continues to do so. Surprisingly, he has had only mild increase in shortness of breath during this time. He denies any fall, motor vehicle accidents, or other trauma to the chest. He has a chronic cough with scant yellow tinged mucus, and has not felt like he had a chest cold. He has had a decreased appetite, but has not lost weight. He denies any history of TB exposure.   Review of Systems  Constitutional: Negative for fever and unexpected weight change.  HENT: Negative for congestion, dental problem, ear pain, nosebleeds, postnasal drip, rhinorrhea, sinus pressure, sneezing, sore throat and trouble swallowing.   Eyes: Negative for redness and itching.  Respiratory: Positive for cough and shortness of breath. Negative for chest tightness and wheezing.   Cardiovascular: Negative for palpitations and leg swelling.  Gastrointestinal: Negative for nausea and vomiting.  Genitourinary: Negative for dysuria.  Musculoskeletal: Negative for joint swelling.  Skin: Negative for rash.  Neurological: Negative for headaches.  Hematological: Does not bruise/bleed easily.  Psychiatric/Behavioral: Negative for dysphoric mood. The  patient is not nervous/anxious.        Objective:   Physical Exam Constitutional:  Well developed, no acute distress  HENT:  Nares patent without discharge  Oropharynx without exudate, palate and uvula are elongated  Eyes:  Perrla, eomi, no scleral icterus  Neck:  No JVD, no TMG  Cardiovascular:  Normal rate, regular rhythm, no rubs or gallops.  No murmurs        Intact distal pulses  Pulmonary :  Decreased bs on left with dullness 1/2 up to percussion, no stridor or respiratory distress   No rales, rhonchi, or wheezing  Abdominal:  Soft, nondistended, bowel sounds present.  No tenderness noted.   Musculoskeletal:  No lower extremity edema noted.  Lymph Nodes:  No cervical lymphadenopathy noted  Skin:  No cyanosis noted  Neurologic:  Alert, appropriate, moves all 4 extremities without obvious deficit.         Assessment & Plan:

## 2014-05-09 NOTE — Patient Instructions (Signed)
Will schedule to have your fluid taken off your lung and analyzed. Will call once the results are available.

## 2014-05-12 ENCOUNTER — Ambulatory Visit (HOSPITAL_COMMUNITY)
Admission: RE | Admit: 2014-05-12 | Discharge: 2014-05-12 | Disposition: A | Discharge status: Home / Self Care | Payer: Medicare Other | Source: Ambulatory Visit | Attending: Radiology | Admitting: Radiology

## 2014-05-12 ENCOUNTER — Ambulatory Visit (HOSPITAL_COMMUNITY)
Admission: RE | Admit: 2014-05-12 | Discharge: 2014-05-12 | Disposition: A | Discharge status: Home / Self Care | Payer: Medicare Other | Source: Ambulatory Visit | Attending: Pulmonary Disease | Admitting: Pulmonary Disease

## 2014-05-12 DIAGNOSIS — J90 Pleural effusion, not elsewhere classified: Secondary | ICD-10-CM

## 2014-05-12 DIAGNOSIS — Z9889 Other specified postprocedural states: Secondary | ICD-10-CM

## 2014-05-12 DIAGNOSIS — J948 Other specified pleural conditions: Secondary | ICD-10-CM | POA: Insufficient documentation

## 2014-05-12 LAB — BODY FLUID CELL COUNT WITH DIFFERENTIAL
Lymphs, Fluid: 74 %
Monocyte-Macrophage-Serous Fluid: 21 % — ABNORMAL LOW (ref 50–90)
NEUTROPHIL FLUID: 5 % (ref 0–25)
WBC FLUID: 1915 uL — AB (ref 0–1000)

## 2014-05-12 LAB — LACTATE DEHYDROGENASE, PLEURAL OR PERITONEAL FLUID: LD, Fluid: 721 U/L — ABNORMAL HIGH (ref 3–23)

## 2014-05-12 LAB — GLUCOSE, SEROUS FLUID: Glucose, Fluid: 106 mg/dL

## 2014-05-12 NOTE — Procedures (Signed)
US guided diagnostic/therapeutic left thoracentesis performed yielding 1.7 liters turbid, amber fluid. A portion of the fluid was sent to the lab for preordered studies. F/u CXR pending. No immediate complications. Since this was pt's first thoracentesis only the above amount of fluid was removed today.

## 2014-05-15 LAB — BODY FLUID CULTURE: Culture: NO GROWTH

## 2014-05-22 ENCOUNTER — Telehealth: Payer: Self-pay | Admitting: *Deleted

## 2014-05-22 ENCOUNTER — Other Ambulatory Visit: Payer: Self-pay | Admitting: Pulmonary Disease

## 2014-05-22 ENCOUNTER — Other Ambulatory Visit: Payer: Self-pay | Admitting: *Deleted

## 2014-05-22 DIAGNOSIS — J90 Pleural effusion, not elsewhere classified: Secondary | ICD-10-CM

## 2014-05-22 DIAGNOSIS — R918 Other nonspecific abnormal finding of lung field: Secondary | ICD-10-CM | POA: Insufficient documentation

## 2014-05-22 NOTE — Telephone Encounter (Signed)
Called to set up appt, unable to reach.  Will call back

## 2014-05-22 NOTE — Telephone Encounter (Signed)
Called patient with appt to see Dr. Julien Nordmann on 05/26/14 arrive at 11:00.  He verbalized understanding of appt time and place.

## 2014-05-26 ENCOUNTER — Ambulatory Visit (HOSPITAL_BASED_OUTPATIENT_CLINIC_OR_DEPARTMENT_OTHER): Payer: Medicare Other | Admitting: Internal Medicine

## 2014-05-26 ENCOUNTER — Telehealth: Payer: Self-pay | Admitting: Internal Medicine

## 2014-05-26 ENCOUNTER — Encounter: Payer: Self-pay | Admitting: Internal Medicine

## 2014-05-26 ENCOUNTER — Ambulatory Visit (HOSPITAL_BASED_OUTPATIENT_CLINIC_OR_DEPARTMENT_OTHER): Payer: Medicare Other

## 2014-05-26 VITALS — BP 156/73 | HR 96 | Temp 98.0°F | Resp 18 | Ht 66.0 in | Wt 185.1 lb

## 2014-05-26 DIAGNOSIS — J449 Chronic obstructive pulmonary disease, unspecified: Secondary | ICD-10-CM

## 2014-05-26 DIAGNOSIS — R918 Other nonspecific abnormal finding of lung field: Secondary | ICD-10-CM

## 2014-05-26 DIAGNOSIS — J90 Pleural effusion, not elsewhere classified: Secondary | ICD-10-CM | POA: Diagnosis present

## 2014-05-26 DIAGNOSIS — J948 Other specified pleural conditions: Secondary | ICD-10-CM

## 2014-05-26 DIAGNOSIS — I10 Essential (primary) hypertension: Secondary | ICD-10-CM

## 2014-05-26 DIAGNOSIS — Z720 Tobacco use: Secondary | ICD-10-CM | POA: Diagnosis not present

## 2014-05-26 LAB — CBC WITH DIFFERENTIAL/PLATELET
BASO%: 0.5 % (ref 0.0–2.0)
BASOS ABS: 0.1 10*3/uL (ref 0.0–0.1)
EOS ABS: 0.3 10*3/uL (ref 0.0–0.5)
EOS%: 2.1 % (ref 0.0–7.0)
HCT: 38.9 % (ref 38.4–49.9)
HGB: 12.7 g/dL — ABNORMAL LOW (ref 13.0–17.1)
LYMPH%: 12.1 % — ABNORMAL LOW (ref 14.0–49.0)
MCH: 29.8 pg (ref 27.2–33.4)
MCHC: 32.6 g/dL (ref 32.0–36.0)
MCV: 91.4 fL (ref 79.3–98.0)
MONO#: 1.3 10*3/uL — ABNORMAL HIGH (ref 0.1–0.9)
MONO%: 9.1 % (ref 0.0–14.0)
NEUT%: 76.2 % — ABNORMAL HIGH (ref 39.0–75.0)
NEUTROS ABS: 10.9 10*3/uL — AB (ref 1.5–6.5)
PLATELETS: 564 10*3/uL — AB (ref 140–400)
RBC: 4.25 10*6/uL (ref 4.20–5.82)
RDW: 12.9 % (ref 11.0–14.6)
WBC: 14.3 10*3/uL — AB (ref 4.0–10.3)
lymph#: 1.7 10*3/uL (ref 0.9–3.3)

## 2014-05-26 LAB — COMPREHENSIVE METABOLIC PANEL (CC13)
ALK PHOS: 89 U/L (ref 40–150)
ALT: 16 U/L (ref 0–55)
AST: 14 U/L (ref 5–34)
Albumin: 3.2 g/dL — ABNORMAL LOW (ref 3.5–5.0)
Anion Gap: 16 mEq/L — ABNORMAL HIGH (ref 3–11)
BUN: 19.1 mg/dL (ref 7.0–26.0)
CO2: 21 mEq/L — ABNORMAL LOW (ref 22–29)
Calcium: 7.9 mg/dL — ABNORMAL LOW (ref 8.4–10.4)
Chloride: 104 mEq/L (ref 98–109)
Creatinine: 0.8 mg/dL (ref 0.7–1.3)
EGFR: 85 mL/min/{1.73_m2} — ABNORMAL LOW (ref >90)
Glucose: 119 mg/dl (ref 70–140)
POTASSIUM: 4.2 meq/L (ref 3.5–5.1)
SODIUM: 140 meq/L (ref 136–145)
TOTAL PROTEIN: 7.2 g/dL (ref 6.4–8.3)
Total Bilirubin: 0.31 mg/dL (ref 0.20–1.20)

## 2014-05-26 NOTE — Telephone Encounter (Signed)
Appointments made and avs printed for patient °

## 2014-05-26 NOTE — Progress Notes (Signed)
[place] [phone]   [phone]  CONSULT NOTE  REFERRING PHYSICIAN: Dr. Danton Sewer  REASON FOR CONSULTATION:  74 years old white male with questionable lung cancer  HPI Ian Gonzales is a 74 y.o. male with past medical history significant for hypertension, COPD, dyslipidemia, GERD, erectile dysfunction as well as long history of smoking. The patient mentions that for the last 3 months he has been complaining of pain on the left side of his chest. He had that chest x-ray performed on 04/10/2014 that showed COPD changes with new left pleural effusion and basilar atelectasis. CT scan of the chest on 05/01/2014 showed Interval increase in now moderate sized left-sided pleural effusion. The etiology of this effusion is not definitively depicted on this examination though there is abnormal asymmetric nodular pleural thickening along the mediastinum which is worrisome for malignancy. While a discrete pulmonary mass is not identified, evaluation is degraded secondary to partial atelectasis/collapse of the left lower lobe and lingula. Further evaluation with thoracentesis sending the acquired fluid for cytology could be performed as clinically indicated. Nonspecific shotty mediastinal lymph nodes with index sub carinal nodal conglomeration measuring 1.1 cm in diameter with differential considerations including reactive and malignant etiologies. The patient was referred to Dr. Gwenette Greet and unable first 2016 he underwent ultrasound guided diagnostic and therapeutic left thoracentesis with drainage of 1.7 cm liters of pleural fluid. The final cytology (Accession: [id number]) showed atypical gland are cells and a small lymphocytes.  Dr. Gwenette Greet kindly referred the patient to me today for further evaluation and recommendation regarding treatment of his condition. When seen today the patient continues to complain of tightness in his chest as well as shortness of breath  and cough. He denied having any significant hemoptysis. He denied having any significant weight loss or night sweats. He has no headache or visual changes. Family history significant for mother and father died from old age. The patient is a widow and has no children. He used to do maintenance work and currently retired. He has a history of smoking 1.5 pack per day for around 63 years and is trying to quit. He also has a history of alcohol abuse but not recently. He has no history of drug abuse.   HPI  Past Medical History  Diagnosis Date  . Hypertension   . GERD (gastroesophageal reflux disease)   . Colon polyps   . Back pain, lumbosacral   . Sinusitis acute 03/24/2011  . Memory loss 04/16/2011  . Allergic state 05/30/2011  . Leukocytosis 12/15/2011  . Hyperlipidemia 02/15/2012  . Unspecified sinusitis (chronic) 04/05/2012  . Epicondylitis 10/10/2012  . Shortness of breath     WITH EXERTION AND BACK PAIN  . Wound drainage     L ELBOW  . Spinal stenosis, lumbar   . Nocturia   . Headache 05/05/2014    Past Surgical History  Procedure Laterality Date  . Lumbar laminectomy  1974    twice  . Urethral stricture dilatation    . Colonoscopy    . Penile prosthesis implant    . Cataracts      REMOVED  . Decompressive lumbar laminectomy level 2 N/A 11/26/2012    Procedure: CENTRAL DECOMPRESSION LUMBAR LAMINECTOMY L3-L4,  L4-L5   ;  Surgeon: Tobi Bastos, MD;  Location: [facility];  Service: Orthopedics;  Laterality: N/A;    History reviewed. No pertinent family history.  Social History History  Substance Use Topics  . Smoking status: Current Every  Day Smoker -- 0.25 packs/day for 63 years    Types: Cigarettes  . Smokeless tobacco: Former Systems developer    Types: Chew     Comment: 3-5 cigs daily  . Alcohol Use: 0.0 oz/week    0 Standard drinks or equivalent per week     Comment: once in a while     Allergies  Allergen Reactions  . Ibuprofen Other (See Comments)    "It made me feel worse"    . Gabapentin     disequilibrium  . Valacyclovir Other (See Comments)    constipation    Current Outpatient Prescriptions  Medication Sig Dispense Refill  . acetaminophen (TYLENOL) 325 MG tablet Take 650 mg by mouth every morning.    Marland Kitchen amLODipine (NORVASC) 5 MG tablet Take 1 tablet (5 mg total) by mouth 2 (two) times daily. for high blood pressure 60 tablet 6  . aspirin 81 MG tablet Take 81 mg by mouth daily.    . naproxen sodium (ALEVE) 220 MG tablet Take 440 mg by mouth every morning.    . ranitidine (ZANTAC) 300 MG tablet take 1 tablet by mouth at bedtime 30 tablet 5   No current facility-administered medications for this visit.    Review of Systems  Constitutional: positive for fatigue Eyes: negative Ears, nose, mouth, throat, and face: negative Respiratory: positive for cough, dyspnea on exertion and pleurisy/chest pain Cardiovascular: negative Gastrointestinal: negative Genitourinary:negative Integument/breast: negative Hematologic/lymphatic: negative Musculoskeletal:negative Neurological: negative Behavioral/Psych: negative Endocrine: negative Allergic/Immunologic: negative  Physical Exam  GYJ:EHUDJ, healthy, no distress, well nourished, well developed and anxious SKIN: skin color, texture, turgor are normal, no rashes or significant lesions HEAD: Normocephalic, No masses, lesions, tenderness or abnormalities EYES: normal, PERRLA, Conjunctiva are pink and non-injected EARS: External ears normal, Canals clear OROPHARYNX:no exudate, no erythema and lips, buccal mucosa, and tongue normal  NECK: supple, no adenopathy, no JVD LYMPH:  no palpable lymphadenopathy, no hepatosplenomegaly LUNGS: expiratory wheezes bilaterally, decreased breath sounds and dullness to percussion in the lower half of the left lung. HEART: regular rate & rhythm, no murmurs and no gallops ABDOMEN:abdomen soft, non-tender, normal bowel sounds and no masses or organomegaly BACK: Back symmetric,  no curvature., No CVA tenderness EXTREMITIES:no joint deformities, effusion, or inflammation, no edema, no skin discoloration  NEURO: alert & oriented x 3 with fluent speech, no focal motor/sensory deficits  PERFORMANCE STATUS: ECOG 1  LABORATORY DATA: Lab Results  Component Value Date   WBC 14.3* 05/26/2014   HGB 12.7* 05/26/2014   HCT 38.9 05/26/2014   MCV 91.4 05/26/2014   PLT 564* 05/26/2014      Chemistry      Component Value Date/Time   NA 138 05/04/2014 1458   K 4.2 05/04/2014 1458   CL 103 05/04/2014 1458   CO2 17* 05/04/2014 1458   BUN 17 05/04/2014 1458   CREATININE 0.77 05/04/2014 1458   CREATININE 0.93 04/10/2014 1055      Component Value Date/Time   CALCIUM 9.3 05/04/2014 1458   ALKPHOS 99 05/04/2014 1458   AST 13 05/04/2014 1458   ALT 13 05/04/2014 1458   BILITOT 0.4 05/04/2014 1458       RADIOGRAPHIC STUDIES: Dg Chest 1 View  05/12/2014   CLINICAL DATA:  Post left thoracentesis  EXAM: CHEST  1 VIEW  COMPARISON:  04/10/2014  FINDINGS: Cardiomediastinal silhouette is stable. There is small residual left pleural effusion with left basilar atelectasis or infiltrate. No pulmonary edema. No pneumothorax. Right lung is clear.  IMPRESSION: Small  residual left pleural effusion left basilar atelectasis or infiltrate. No pneumothorax.   Electronically Signed   By: Lahoma Crocker M.D.   On: 05/12/2014 11:28   Ct Head Wo Contrast  05/04/2014   CLINICAL DATA:  Headache. Visual changes, dizziness, posterior neck pain.  EXAM: CT HEAD WITHOUT CONTRAST  TECHNIQUE: Contiguous axial images were obtained from the base of the skull through the vertex without intravenous contrast.  COMPARISON:  None.  FINDINGS: Mild, likely age-appropriate atrophy with mild diffuse sulcal prominence. Scattered periventricular hypodensities compatible with microvascular ischemic disease. Bilateral basal ganglial calcifications. Gray-white differentiation is otherwise well maintained without CT evidence of  acute large territory infarct. No intraparenchymal or extra-axial mass or hemorrhage. Unchanged size and configuration of the ventricles and basilar cisterns. No midline shift. Intracranial atherosclerosis. Limited visualization the paranasal sinuses and mastoid air cells is normal. No air-fluid levels. Note is made of a punctate (approximately 0.9 x 0.7 cm) nodule within the subcutaneous tissues about the high right parietal occipital calvarium (image 28, series 2), too small to accurately characterize of favored to represent a sebaceous cyst. Post bilateral cataract surgery. No displaced calvarial fracture.  IMPRESSION: Mild, likely age-appropriate, atrophy and microvascular ischemic disease without acute intracranial process.   Electronically Signed   By: Sandi Mariscal M.D.   On: 05/04/2014 17:18   Ct Chest W Contrast  05/04/2014   CLINICAL DATA:  History of left-sided pleural effusion. Shortness of breath. Chest pain. History of smoking.  EXAM: CT CHEST WITH CONTRAST  TECHNIQUE: Multidetector CT imaging of the chest was performed during intravenous contrast administration.  CONTRAST:  56mL OMNIPAQUE IOHEXOL 300 MG/ML  SOLN  COMPARISON:  Chest radiograph - 03/21/2014; 09/05/2013  FINDINGS: Interval increase in size of now moderate left-sided pleural effusion. This finding is associated with abnormal nodular thickening of the left-sided mediastinal pleura with index area of nodular pleural thickening adjacent to the left main pulmonary artery measuring approximately 2.1 x 1.0 cm (image 25, series 2). There are scattered subpleural nodules about the nondependent portion of the left upper lobe with index nodule measuring approximately 4 mm in diameter (image 28, series 3). There are no definitive pleural calcifications.  There is partial atelectasis/collapse of the left lower lobe (image 41, series 3) and caudal segment of the lingula (image 40, series 3). The central pulmonary airways remain patent.  Nonspecific  shotty mediastinal lymph nodes with index sub carinal nodal conglomeration measuring approximately 1.1 cm in greatest short axis diameter (image 29, series 2). Additional scattered shotty mediastinal and right hilar lymph nodes are individually not enlarged by size criteria.  Normal heart size. Coronary artery calcifications. No pericardial effusion. Calcifications within the aortic valve leaflets. There is mild fusiform ectasia of the ascending thoracic aorta measuring 38 mm in diameter. Conventional configuration of the aortic arch. The branch vessels of the aortic arch widely patent throughout their imaged course.  Although this examination was not tailored for the evaluation of the pulmonary arteries, there are no discrete filling defects within the central pulmonary arterial tree to suggest central pulmonary embolism.  Limited evaluation of the upper abdomen is unremarkable.  No acute or aggressive osseous abnormalities.  Regional soft tissues appear normal. Normal appearance of the thyroid gland.  IMPRESSION:: IMPRESSION: 1. Interval increase in now moderate sized left-sided pleural effusion. The etiology of this effusion is not definitively depicted on this examination though there is abnormal asymmetric nodular pleural thickening along the mediastinum which is worrisome for malignancy. While a discrete pulmonary mass  is not identified, evaluation is degraded secondary to partial atelectasis/collapse of the left lower lobe and lingula. Further evaluation with thoracentesis sending the acquired fluid for cytology could be performed as clinically indicated. 2. Nonspecific shotty mediastinal lymph nodes with index sub carinal nodal conglomeration measuring 1.1 cm in diameter with differential considerations including reactive and malignant etiologies. 3. Coronary artery calcifications. 4. Calcifications within the aortic valve leaflets with associated mild ectasia of the ascending thoracic aorta measuring 38 mm  in diameter. Further evaluation cardiac echo could be performed as clinically indicated. These results will be called to the ordering clinician or representative by the Radiologist Assistant, and communication documented in the PACS or zVision Dashboard.   Electronically Signed   By: Sandi Mariscal M.D.   On: 05/04/2014 17:31   US Thoracentesis Asp Pleural Space W/img Guide  05/12/2014   INDICATION: Smoker with dyspnea, new left pleural effusion; request is made for diagnostic and therapeutic left thoracentesis  EXAM: ULTRASOUND GUIDED DIAGNOSTIC AND THERAPEUTIC LEFT THORACENTESIS  COMPARISON:  None.  MEDICATIONS: None  COMPLICATIONS: None immediate  TECHNIQUE: Informed written consent was obtained from the patient after a discussion of the risks, benefits and alternatives to treatment. A timeout was performed prior to the initiation of the procedure.  Initial ultrasound scanning demonstrates a large left pleural effusion. The lower chest was prepped and draped in the usual sterile fashion. 1% lidocaine was used for local anesthesia.  An ultrasound image was saved for documentation purposes. An 6 Fr Safe-T-Centesis catheter was introduced. The thoracentesis was performed. The catheter was removed and a dressing was applied. The patient tolerated the procedure well without immediate post procedural complication. The patient was escorted to have an upright chest radiograph.  FINDINGS: A total of approximately 1.7 liters of turbid, amber fluid was removed. Requested samples were sent to the laboratory. Since this was the patient's first thoracentesis only the above amount of fluid was removed at this time.  IMPRESSION: Successful ultrasound-guided diagnostic and therapeutic left sided thoracentesis yielding 1.7 liters of pleural fluid.  Read by: Rowe Robert, PA-C   Electronically Signed   By: Jerilynn Mages.  Shick M.D.   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   PLAN: I had a lengthy discussion with the patient today about his condition. He is a scheduled for a PET scan on 05/31/2014. I will complete the staging workup by ordering a MRI of the brain to rule out brain metastasis. I will also refer the patient to a cardiothoracic surgery for evaluation and consideration of left VATS and Pleurx catheter drainage of the left recurrent pleural effusion as well as tissue diagnosis. I will see the patient back for follow-up visit in one week for reevaluation and more detailed discussion of his treatment options. For smoke cessation, I strongly encouraged the patient to quit smoking and he had smoke cessation counseling by the thoracic navigator. He was advised to call immediately if he has any concerning symptoms in the interval.  The patient voices understanding of current disease status and treatment options and is in agreement with the current care plan.  All questions were answered. The patient knows to call the clinic with any problems, questions or concerns. We can certainly see the patient much sooner if necessary.  Thank you so much for allowing me to participate in the  care of Ian Gonzales. I will continue to follow up the patient with you and assist in his care.  I spent 40 minutes counseling the patient face to face. The total time spent in the appointment was 60 minutes.  Disclaimer: This note was dictated with voice recognition software. Similar sounding words can inadvertently be transcribed and may not be corrected upon review.   Maxton Noreen K. May 26, 2014, 12:01 PM

## 2014-05-26 NOTE — Patient Instructions (Signed)
Smoking Cessation, Tips for Success  If you are ready to quit smoking, congratulations! You have chosen to help yourself be healthier. Cigarettes bring nicotine, tar, carbon monoxide, and other irritants into your body. Your lungs, heart, and blood vessels will be able to work better without these poisons. There are many different ways to quit smoking. Nicotine gum, nicotine patches, a nicotine inhaler, or nicotine nasal spray can help with physical craving. Hypnosis, support groups, and medicines help break the habit of smoking.  WHAT THINGS CAN I DO TO MAKE QUITTING EASIER?   Here are some tips to help you quit for good:  · Pick a date when you will quit smoking completely. Tell all of your friends and family about your plan to quit on that date.  · Do not try to slowly cut down on the number of cigarettes you are smoking. Pick a quit date and quit smoking completely starting on that day.  · Throw away all cigarettes.    · Clean and remove all ashtrays from your home, work, and car.  · On a card, write down your reasons for quitting. Carry the card with you and read it when you get the urge to smoke.  · Cleanse your body of nicotine. Drink enough water and fluids to keep your urine clear or pale yellow. Do this after quitting to flush the nicotine from your body.  · Learn to predict your moods. Do not let a bad situation be your excuse to have a cigarette. Some situations in your life might tempt you into wanting a cigarette.  · Never have "just one" cigarette. It leads to wanting another and another. Remind yourself of your decision to quit.  · Change habits associated with smoking. If you smoked while driving or when feeling stressed, try other activities to replace smoking. Stand up when drinking your coffee. Brush your teeth after eating. Sit in a different chair when you read the paper. Avoid alcohol while trying to quit, and try to drink fewer caffeinated beverages. Alcohol and caffeine may urge you to  smoke.  · Avoid foods and drinks that can trigger a desire to smoke, such as sugary or spicy foods and alcohol.  · Ask people who smoke not to smoke around you.  · Have something planned to do right after eating or having a cup of coffee. For example, plan to take a walk or exercise.  · Try a relaxation exercise to calm you down and decrease your stress. Remember, you may be tense and nervous for the first 2 weeks after you quit, but this will pass.  · Find new activities to keep your hands busy. Play with a pen, coin, or rubber band. Doodle or draw things on paper.  · Brush your teeth right after eating. This will help cut down on the craving for the taste of tobacco after meals. You can also try mouthwash.    · Use oral substitutes in place of cigarettes. Try using lemon drops, carrots, cinnamon sticks, or chewing gum. Keep them handy so they are available when you have the urge to smoke.  · When you have the urge to smoke, try deep breathing.  · Designate your home as a nonsmoking area.  · If you are a heavy smoker, ask your health care provider about a prescription for nicotine chewing gum. It can ease your withdrawal from nicotine.  · Reward yourself. Set aside the cigarette money you save and buy yourself something nice.  · Look for   support from others. Join a support group or smoking cessation program. Ask someone at home or at work to help you with your plan to quit smoking.  · Always ask yourself, "Do I need this cigarette or is this just a reflex?" Tell yourself, "Today, I choose not to smoke," or "I do not want to smoke." You are reminding yourself of your decision to quit.  · Do not replace cigarette smoking with electronic cigarettes (commonly called e-cigarettes). The safety of e-cigarettes is unknown, and some may contain harmful chemicals.  · If you relapse, do not give up! Plan ahead and think about what you will do the next time you get the urge to smoke.  HOW WILL I FEEL WHEN I QUIT SMOKING?  You  may have symptoms of withdrawal because your body is used to nicotine (the addictive substance in cigarettes). You may crave cigarettes, be irritable, feel very hungry, cough often, get headaches, or have difficulty concentrating. The withdrawal symptoms are only temporary. They are strongest when you first quit but will go away within 10-14 days. When withdrawal symptoms occur, stay in control. Think about your reasons for quitting. Remind yourself that these are signs that your body is healing and getting used to being without cigarettes. Remember that withdrawal symptoms are easier to treat than the major diseases that smoking can cause.   Even after the withdrawal is over, expect periodic urges to smoke. However, these cravings are generally short lived and will go away whether you smoke or not. Do not smoke!  WHAT RESOURCES ARE AVAILABLE TO HELP ME QUIT SMOKING?  Your health care provider can direct you to community resources or hospitals for support, which may include:  · Group support.  · Education.  · Hypnosis.  · Therapy.  Document Released: 10/26/2003 Document Revised: 06/13/2013 Document Reviewed: 07/15/2012  [employer]® Patient Information ©2015 [employer]. This information is not intended to replace advice given to you by your health care provider. Make sure you discuss any questions you have with your health care provider.

## 2014-05-30 ENCOUNTER — Emergency Department (HOSPITAL_COMMUNITY): Payer: Medicare Other

## 2014-05-30 ENCOUNTER — Inpatient Hospital Stay (HOSPITAL_COMMUNITY)
Admission: EM | Admit: 2014-05-30 | Discharge: 2014-05-31 | DRG: 181 | Disposition: A | Discharge status: Home Health | Payer: Medicare Other | Attending: Internal Medicine | Admitting: Internal Medicine

## 2014-05-30 ENCOUNTER — Telehealth: Payer: Self-pay | Admitting: *Deleted

## 2014-05-30 DIAGNOSIS — J90 Pleural effusion, not elsewhere classified: Secondary | ICD-10-CM

## 2014-05-30 DIAGNOSIS — J910 Malignant pleural effusion: Secondary | ICD-10-CM | POA: Insufficient documentation

## 2014-05-30 DIAGNOSIS — R739 Hyperglycemia, unspecified: Secondary | ICD-10-CM | POA: Diagnosis present

## 2014-05-30 DIAGNOSIS — I10 Essential (primary) hypertension: Secondary | ICD-10-CM | POA: Diagnosis present

## 2014-05-30 DIAGNOSIS — K219 Gastro-esophageal reflux disease without esophagitis: Secondary | ICD-10-CM | POA: Diagnosis present

## 2014-05-30 DIAGNOSIS — C3490 Malignant neoplasm of unspecified part of unspecified bronchus or lung: Principal | ICD-10-CM | POA: Diagnosis present

## 2014-05-30 DIAGNOSIS — E785 Hyperlipidemia, unspecified: Secondary | ICD-10-CM | POA: Diagnosis present

## 2014-05-30 DIAGNOSIS — Z7982 Long term (current) use of aspirin: Secondary | ICD-10-CM | POA: Diagnosis not present

## 2014-05-30 DIAGNOSIS — F17210 Nicotine dependence, cigarettes, uncomplicated: Secondary | ICD-10-CM | POA: Diagnosis present

## 2014-05-30 DIAGNOSIS — J449 Chronic obstructive pulmonary disease, unspecified: Secondary | ICD-10-CM | POA: Diagnosis present

## 2014-05-30 DIAGNOSIS — Z79899 Other long term (current) drug therapy: Secondary | ICD-10-CM | POA: Diagnosis not present

## 2014-05-30 DIAGNOSIS — R918 Other nonspecific abnormal finding of lung field: Secondary | ICD-10-CM | POA: Diagnosis present

## 2014-05-30 DIAGNOSIS — Z419 Encounter for procedure for purposes other than remedying health state, unspecified: Secondary | ICD-10-CM

## 2014-05-30 DIAGNOSIS — R0602 Shortness of breath: Secondary | ICD-10-CM | POA: Diagnosis present

## 2014-05-30 LAB — CBC
HCT: 34.4 % — ABNORMAL LOW (ref 39.0–52.0)
HEMOGLOBIN: 11.5 g/dL — AB (ref 13.0–17.0)
MCH: 30.7 pg (ref 26.0–34.0)
MCHC: 33.4 g/dL (ref 30.0–36.0)
MCV: 91.7 fL (ref 78.0–100.0)
PLATELETS: 514 10*3/uL — AB (ref 150–400)
RBC: 3.75 MIL/uL — ABNORMAL LOW (ref 4.22–5.81)
RDW: 13.2 % (ref 11.5–15.5)
WBC: 13.4 10*3/uL — ABNORMAL HIGH (ref 4.0–10.5)

## 2014-05-30 LAB — BASIC METABOLIC PANEL
ANION GAP: 9 (ref 5–15)
BUN: 26 mg/dL — ABNORMAL HIGH (ref 6–23)
CALCIUM: 8.7 mg/dL (ref 8.4–10.5)
CO2: 20 mmol/L (ref 19–32)
CREATININE: 1 mg/dL (ref 0.50–1.35)
Chloride: 105 mmol/L (ref 96–112)
GFR calc non Af Amer: 71 mL/min — ABNORMAL LOW (ref >90)
GFR, EST AFRICAN AMERICAN: 82 mL/min — AB (ref >90)
Glucose, Bld: 127 mg/dL — ABNORMAL HIGH (ref 70–99)
Potassium: 3.8 mmol/L (ref 3.5–5.1)
Sodium: 134 mmol/L — ABNORMAL LOW (ref 135–145)

## 2014-05-30 LAB — I-STAT TROPONIN, ED: TROPONIN I, POC: 0.01 ng/mL (ref 0.00–0.08)

## 2014-05-30 LAB — BRAIN NATRIURETIC PEPTIDE: B Natriuretic Peptide: 25.2 pg/mL (ref 0.0–100.0)

## 2014-05-30 NOTE — ED Notes (Signed)
[facility] updated [facility] called and made aware of need to transport to [facility]

## 2014-05-30 NOTE — ED Provider Notes (Signed)
CSN: [id number]     Arrival date & time 05/30/14  1544 History   First MD Initiated Contact with Patient 05/30/14 1618     Chief Complaint  Patient presents with  . Shortness of Breath  . Chest Pain     (Consider location/radiation/quality/duration/timing/severity/associated sxs/prior Treatment) HPI Comments: The patient is a 74 year old male, history of cigarette abuse for many years, has had 3 or 4 months of left-sided chest pain and shortness of breath which has been progressive. This finally culminated in a visit to his doctor, chest x-ray revealed pleural effusion, this was drained with thoracentesis and found to have positive cytology for likely lung cancer. He was referred to oncology and pulmonary, his last visit was on April 15 at which time the oncologist, Dr. Earlie Server, stated that he wanted to refer the patient to cardiothoracic surgeon for Pleurx catheter drainage of the left recurrent pleural effusion. The patient states that over the last several days he has had a rapid progression in his shortness of breath left-sided chest pain and left-sided shoulder pain. He denies nausea or vomiting, has occasional productive cough in the morning, denies fevers diaphoresis swelling of the legs. The symptoms are progressive, are now becoming severe, he states he always feels hungry for air.  Patient is a 74 y.o. male presenting with shortness of breath and chest pain. The history is provided by the patient, the spouse and medical records.  Shortness of Breath Associated symptoms: chest pain   Chest Pain Associated symptoms: shortness of breath     Past Medical History  Diagnosis Date  . Hypertension   . GERD (gastroesophageal reflux disease)   . Colon polyps   . Back pain, lumbosacral   . Sinusitis acute 03/24/2011  . Memory loss 04/16/2011  . Allergic state 05/30/2011  . Leukocytosis 12/15/2011  . Hyperlipidemia 02/15/2012  . Unspecified sinusitis (chronic) 04/05/2012  . Epicondylitis  10/10/2012  . Shortness of breath     WITH EXERTION AND BACK PAIN  . Wound drainage     L ELBOW  . Spinal stenosis, lumbar   . Nocturia   . Headache 05/05/2014   Past Surgical History  Procedure Laterality Date  . Lumbar laminectomy  1974    twice  . Urethral stricture dilatation    . Colonoscopy    . Penile prosthesis implant    . Cataracts      REMOVED  . Decompressive lumbar laminectomy level 2 N/A 11/26/2012    Procedure: CENTRAL DECOMPRESSION LUMBAR LAMINECTOMY L3-L4,  L4-L5   ;  Surgeon: Tobi Bastos, MD;  Location: [facility];  Service: Orthopedics;  Laterality: N/A;   No family history on file. History  Substance Use Topics  . Smoking status: Current Every Day Smoker -- 0.25 packs/day for 63 years    Types: Cigarettes  . Smokeless tobacco: Former Systems developer    Types: Chew     Comment: 3-5 cigs daily  . Alcohol Use: 0.0 oz/week    0 Standard drinks or equivalent per week     Comment: once in a while     Review of Systems  Respiratory: Positive for shortness of breath.   Cardiovascular: Positive for chest pain.  All other systems reviewed and are negative.     Allergies  Ibuprofen; Gabapentin; and Valacyclovir  Home Medications   Prior to Admission medications   Medication Sig Start Date End Date Taking? Authorizing Provider  acetaminophen (TYLENOL) 325 MG tablet Take 650 mg by mouth every morning.  Yes Historical Provider, MD  amLODipine (NORVASC) 5 MG tablet Take 1 tablet (5 mg total) by mouth 2 (two) times daily. for high blood pressure 05/04/14  Yes Mosie Lukes, MD  aspirin 81 MG tablet Take 81 mg by mouth daily.   Yes Historical Provider, MD  naproxen sodium (ALEVE) 220 MG tablet Take 440 mg by mouth every morning.   Yes Historical Provider, MD  ranitidine (ZANTAC) 300 MG tablet take 1 tablet by mouth at bedtime 03/15/14  Yes Mosie Lukes, MD   There were no vitals taken for this visit. Physical Exam  Constitutional: He appears well-developed and  well-nourished. No distress.  HENT:  Head: Normocephalic and atraumatic.  Mouth/Throat: Oropharynx is clear and moist. No oropharyngeal exudate.  Eyes: Conjunctivae and EOM are normal. Pupils are equal, round, and reactive to light. Right eye exhibits no discharge. Left eye exhibits no discharge. No scleral icterus.  Neck: Normal range of motion. Neck supple. No JVD present. No thyromegaly present.  Cardiovascular: Normal rate, regular rhythm, normal heart sounds and intact distal pulses.  Exam reveals no gallop and no friction rub.   No murmur heard. Pulmonary/Chest: Effort normal and breath sounds normal. No respiratory distress. He has no wheezes. He has no rales.  Chest is dull to percussion on the left, decreased breath sounds at the left base and midlung, normal breath sounds on the right, no respiratory distress  Abdominal: Soft. Bowel sounds are normal. He exhibits no distension and no mass. There is no tenderness.  Musculoskeletal: Normal range of motion. He exhibits no edema or tenderness.  Lymphadenopathy:    He has no cervical adenopathy.  Neurological: He is alert. Coordination normal.  Skin: Skin is warm and dry. No rash noted. No erythema.  Psychiatric: He has a normal mood and affect. His behavior is normal.  Nursing note and vitals reviewed.   ED Course  Procedures (including critical care time) Labs Review Labs Reviewed  CBC - Abnormal; Notable for the following:    WBC 13.4 (*)    RBC 3.75 (*)    Hemoglobin 11.5 (*)    HCT 34.4 (*)    Platelets 514 (*)    All other components within normal limits  BASIC METABOLIC PANEL - Abnormal; Notable for the following:    Sodium 134 (*)    Glucose, Bld 127 (*)    BUN 26 (*)    GFR calc non Af Amer 71 (*)    GFR calc Af Amer 82 (*)    All other components within normal limits  BRAIN NATRIURETIC PEPTIDE  I-STAT TROPOININ, ED    Imaging Review Dg Chest 2 View  05/30/2014   CLINICAL DATA:  Cough, congestion for 3 days.   EXAM: CHEST  2 VIEW  COMPARISON:  05/12/2014  FINDINGS: Moderate-large left pleural effusion with associated atelectasis. Clear right lung. No pneumothorax. Stable cardiomediastinal silhouette. No acute osseous abnormality.  IMPRESSION: Interval enlargement of a moderate-large left pleural effusion.   Electronically Signed   By: Kathreen Devoid   On: 05/30/2014 16:52     EKG Interpretation   Date/Time:  Tuesday May 30 2014 15:52:20 EDT Ventricular Rate:  102 PR Interval:  164 QRS Duration: 140 QT Interval:  373 QTC Calculation: 486 R Axis:   65 Text Interpretation:  Sinus tachycardia Right bundle branch block Baseline  wander in lead(s) V3 since last tracing no significant change Confirmed by  Kaidyn Javid  MD, Vivaan Helseth ([id number]) on 05/30/2014 5:14:33 PM  MDM   Final diagnoses:  Malignant pleural effusion    The patient does not appear in distress though he does have a moderate to large sided left pleural effusion which has reaccumulated rather acutely, will discussed with cardiovascular surgery as well as with internal medicine services to arrange for further treatment and inpatient evaluation. I do not think it wise to send the patient home at this time with his rapidly progressive pleural effusion and increased dyspnea. Doubt other sources such as pulmonary embolism, pneumonia, pneumothorax given abnormal chest x-ray findings and history of malignant pleural effusion.  Discussed with cardiothoracic surgery, Dr. Servando Snare, recommends: pleurX catheter  D/w Dr. Humphrey Rolls who will arrange transfer  Noemi Chapel, MD 05/30/14 [phone]

## 2014-05-30 NOTE — ED Notes (Signed)
[facility] staff present to take patient to [facility]

## 2014-05-30 NOTE — Telephone Encounter (Signed)
Pt called states, " I'm having some trouble breathing and I'm suppose to have a chest xray tomorrow then see the MD the next day. Are they going to draw water off me tomorrow. I dont know if I can wait that long?" Discussed with pt he is scheduled for a PET scan 4/20, they will not be drawing any fluid. Encouraged pt to go to ED or urgent care due to difficulty breathing. Pt denied needing 911 states " it's not that bad" Pt advised he might drive himself or call someone, encouraged pt to call 911 unless he is able to get to ED.  Pt declined calling 911, agreed he would either drive or have someone take him to ED.

## 2014-05-30 NOTE — ED Notes (Signed)
Ian Gonzales contact person [phone]. Please call with room assignment.

## 2014-05-30 NOTE — ED Notes (Addendum)
Pt seen by Dr Earlie Server on 4/15 with note that states "This is a very pleasant 74 years old with a long history of smoking presenting with recurrent left pleural effusion highly suspicious for malignant pleural effusion of lung primary".     Pt reports increased SOB. Pt in triage conversation does not appear to fully understand treatment or diagnosis. Pt reports left sided chest pain 7/10. Mohammed's note from today states " Pt needs to have the PET scan tomorrow. If patient gets admitted to the hopsital, this may take another 2 weeks to be scheduled. Please ask him to wait until the PET scan is done unless he feels very bad."

## 2014-05-30 NOTE — Progress Notes (Signed)
Patient ID: Ian Gonzales, male   DOB: 06/03/1940, 74 y.o.   MRN: [id number]      [address]       [place] [address]             [phone]        Ian Gonzales [place] Medical Record #[id number] Date of Birth: [birth year]  Referring: Emergency Room [facility] Primary Care: Penni Homans, MD  Chief Complaint:    Chief Complaint  Patient presents with  . Shortness of Breath  . Chest Pain    History of Present Illness:     The patient is a 74 year old male, has had 3 or 4 months of left-sided chest pain and shortness of breath which has been progressive. , Chest x-ray revealed pleural effusion, this was drained with thoracentesis and found to have positive cytology for likely lung cancer. He was referred to oncology and pulmonary, his last visit was on April 15 at which time the oncologist, he was to have pleurix tomorrow. The patient states that over the last several days he has had a rapid progression in his shortness of breath left-sided chest pain and left-sided shoulder pain.  has occasional productive cough in the morning, denies fevers diaphoresis swelling of the legs. The symptoms are progressive, are now becoming severe, he states he always feels hungry for air.   history of cigarette abuse for many years  Current Activity/ Functional Status: Patient is independent with mobility/ambulation, transfers, ADL's, IADL's. Lives alone   Zubrod Score: At the time of surgery this patient's most appropriate activity status/level should be described as: '[]'$     0    Normal activity, no symptoms '[x]'$     1    Restricted in physical strenuous activity but ambulatory, able to do out light work '[]'$     2    Ambulatory and capable of self care, unable to do work activities, up and about                 more than 50%  Of the time                            '[]'$     3    Only limited self care, in bed greater than 50% of waking hours '[]'$     4    Completely disabled, no self care,  confined to bed or chair '[]'$     5    Moribund  Past Medical History  Diagnosis Date  . Hypertension   . GERD (gastroesophageal reflux disease)   . Colon polyps   . Back pain, lumbosacral   . Sinusitis acute 03/24/2011  . Memory loss 04/16/2011  . Allergic state 05/30/2011  . Leukocytosis 12/15/2011  . Hyperlipidemia 02/15/2012  . Unspecified sinusitis (chronic) 04/05/2012  . Epicondylitis 10/10/2012  . Shortness of breath     WITH EXERTION AND BACK PAIN  . Wound drainage     L ELBOW  . Spinal stenosis, lumbar   . Nocturia   . Headache 05/05/2014    Past Surgical History  Procedure Laterality Date  . Lumbar laminectomy  1974    twice  . Urethral stricture dilatation    . Colonoscopy    . Penile prosthesis implant    . Cataracts      REMOVED  . Decompressive lumbar laminectomy level 2 N/A 11/26/2012    Procedure: CENTRAL DECOMPRESSION LUMBAR LAMINECTOMY L3-L4,  L4-L5   ;  Surgeon: Tobi Bastos, MD;  Location: [facility];  Service: Orthopedics;  Laterality: N/A;    History  Smoking status  . Current Every Day Smoker -- 0.25 packs/day for 63 years  . Types: Cigarettes  Smokeless tobacco  . Former Systems developer  . Types: Chew    Comment: 3-5 cigs daily   History  Alcohol Use  . 0.0 oz/week  . 0 Standard drinks or equivalent per week    Comment: once in a while     History   Social History  . Marital Status: Single    Spouse Name: N/A  . Number of Children: 0  . Years of Education: N/A   Occupational History  . retired    Social History Main Topics  . Smoking status: Current Every Day Smoker -- 0.25 packs/day for 63 years    Types: Cigarettes  . Smokeless tobacco: Former Systems developer    Types: Chew     Comment: 3-5 cigs daily  . Alcohol Use: 0.0 oz/week    0 Standard drinks or equivalent per week     Comment: once in a while   . Drug Use: No  . Sexual Activity: No   Other Topics Concern  . Not on file   Social History Narrative    Allergies  Allergen Reactions  .  Ibuprofen Other (See Comments)    "It made me feel worse"  . Gabapentin     disequilibrium  . Valacyclovir Other (See Comments)    constipation    No current facility-administered medications for this encounter.   Current Outpatient Prescriptions  Medication Sig Dispense Refill  . acetaminophen (TYLENOL) 325 MG tablet Take 650 mg by mouth every morning.    Marland Kitchen amLODipine (NORVASC) 5 MG tablet Take 1 tablet (5 mg total) by mouth 2 (two) times daily. for high blood pressure 60 tablet 6  . aspirin 81 MG tablet Take 81 mg by mouth daily.    . naproxen sodium (ALEVE) 220 MG tablet Take 440 mg by mouth every morning.    . ranitidine (ZANTAC) 300 MG tablet take 1 tablet by mouth at bedtime 30 tablet 5     (Not in a hospital admission)  No family history on file.   Review of Systems:      Cardiac Review of Systems: Y or N  Chest Pain [  y  ]  Resting SOB [ y  ] Exertional SOB  Blue.Reese  ]  Orthopnea [  y]   Pedal Edema [ n  ]    Palpitations [ n ] Syncope  [ n]   Presyncope [n   ]  General Review of Systems: [Y] = yes [  ]=no Constitional: recent weight change [  ]; anorexia [ y ]; fatigue [ y ]; nausea [  ]; night sweats [ n ]; fever [  ]; or chills [  n]                                                               Dental: poor dentition[  ]; Last Dentist visit:   Eye : blurred vision [  ]; diplopia [   ]; vision changes [  ];  Amaurosis fugax[  ]; Resp: cough [  ];  wheezing[  ];  hemoptysis[  ]; shortness of breath[  ]; paroxysmal nocturnal dyspnea[  ]; dyspnea on exertion[  ]; or orthopnea[  ];  GI:  gallstones[  ], vomiting[  ];  dysphagia[  ]; melena[  ];  hematochezia [  ]; heartburn[  ];   Hx of  Colonoscopy[  ]; GU: kidney stones [  ]; hematuria[  ];   dysuria [  ];  nocturia[  ];  history of     obstruction [  ]; urinary frequency [  ]             Skin: rash, swelling[  ];, hair loss[  ];  peripheral edema[  ];  or itching[  ]; Musculosketetal: myalgias[  ];  joint swelling[  ];   joint erythema[  ];  joint pain[  ];  back pain[  ];  Heme/Lymph: bruising[  ];  bleeding[  ];  anemia[  ];  Neuro: TIA[  ];  headaches[  ];  stroke[  ];  vertigo[  ];  seizures[  ];   paresthesias[  ];  difficulty walking[  ];  Psych:depression[  ]; anxiety[  ];  Endocrine: diabetes[  ];  thyroid dysfunction[  ];  Immunizations: Flu [  ]; Pneumococcal[  ];  Other:  Physical Exam: BP 150/75 mmHg  Pulse 89  Temp(Src) 98.2 F (36.8 C) (Oral)  Resp 22  SpO2 95%   Patient is with moderate resp distress General appearance: alert and cooperative Head: Normocephalic, without obvious abnormality, atraumatic Neck: no adenopathy, no carotid bruit, no JVD, supple, symmetrical, trachea midline and thyroid not enlarged, symmetric, no tenderness/mass/nodules Lymph nodes: Cervical, supraclavicular, and axillary nodes normal. Resp: diminished breath sounds LLL and LUL Back: negative, symmetric, no curvature. ROM normal. No CVA tenderness. Cardio: regular rate and rhythm, S1, S2 normal, no murmur, click, rub or gallop GI: soft, non-tender; bowel sounds normal; no masses,  no organomegaly Extremities: extremities normal, atraumatic, no cyanosis or edema and Homans sign is negative, no sign of DVT Neurologic: Grossly normal  Diagnostic Studies & Laboratory data:     Recent Radiology Findings:   Dg Chest 2 View  05/30/2014   CLINICAL DATA:  Cough, congestion for 3 days.  EXAM: CHEST  2 VIEW  COMPARISON:  05/12/2014  FINDINGS: Moderate-large left pleural effusion with associated atelectasis. Clear right lung. No pneumothorax. Stable cardiomediastinal silhouette. No acute osseous abnormality.  IMPRESSION: Interval enlargement of a moderate-large left pleural effusion.   Electronically Signed   By: Kathreen Devoid   On: 05/30/2014 16:52    Dg Chest 1 View  05/12/2014   CLINICAL DATA:  Post left thoracentesis  EXAM: CHEST  1 VIEW  COMPARISON:  04/10/2014  FINDINGS: Cardiomediastinal silhouette is stable.  There is small residual left pleural effusion with left basilar atelectasis or infiltrate. No pulmonary edema. No pneumothorax. Right lung is clear.  IMPRESSION: Small residual left pleural effusion left basilar atelectasis or infiltrate. No pneumothorax.   Electronically Signed   By: Lahoma Crocker M.D.   On: 05/12/2014 11:28   Dg Chest 2 View  05/30/2014   CLINICAL DATA:  Cough, congestion for 3 days.  EXAM: CHEST  2 VIEW  COMPARISON:  05/12/2014  FINDINGS: Moderate-large left pleural effusion with associated atelectasis. Clear right lung. No pneumothorax. Stable cardiomediastinal silhouette. No acute osseous abnormality.  IMPRESSION: Interval enlargement of a moderate-large left pleural effusion.   Electronically Signed   By: Kathreen Devoid   On: 05/30/2014 16:52   Ct Head Wo Contrast  05/04/2014   CLINICAL DATA:  Headache. Visual changes, dizziness, posterior neck pain.  EXAM: CT HEAD WITHOUT CONTRAST  TECHNIQUE: Contiguous axial images were obtained from the base of the skull through the vertex without intravenous contrast.  COMPARISON:  None.  FINDINGS: Mild, likely age-appropriate atrophy with mild diffuse sulcal prominence. Scattered periventricular hypodensities compatible with microvascular ischemic disease. Bilateral basal ganglial calcifications. Gray-white differentiation is otherwise well maintained without CT evidence of acute large territory infarct. No intraparenchymal or extra-axial mass or hemorrhage. Unchanged size and configuration of the ventricles and basilar cisterns. No midline shift. Intracranial atherosclerosis. Limited visualization the paranasal sinuses and mastoid air cells is normal. No air-fluid levels. Note is made of a punctate (approximately 0.9 x 0.7 cm) nodule within the subcutaneous tissues about the high right parietal occipital calvarium (image 28, series 2), too small to accurately characterize of favored to represent a sebaceous cyst. Post bilateral cataract surgery. No  displaced calvarial fracture.  IMPRESSION: Mild, likely age-appropriate, atrophy and microvascular ischemic disease without acute intracranial process.   Electronically Signed   By: Sandi Mariscal M.D.   On: 05/04/2014 17:18   Ct Chest W Contrast  05/04/2014   CLINICAL DATA:  History of left-sided pleural effusion. Shortness of breath. Chest pain. History of smoking.  EXAM: CT CHEST WITH CONTRAST  TECHNIQUE: Multidetector CT imaging of the chest was performed during intravenous contrast administration.  CONTRAST:  28m OMNIPAQUE IOHEXOL 300 MG/ML  SOLN  COMPARISON:  Chest radiograph - 03/21/2014; 09/05/2013  FINDINGS: Interval increase in size of now moderate left-sided pleural effusion. This finding is associated with abnormal nodular thickening of the left-sided mediastinal pleura with index area of nodular pleural thickening adjacent to the left main pulmonary artery measuring approximately 2.1 x 1.0 cm (image 25, series 2). There are scattered subpleural nodules about the nondependent portion of the left upper lobe with index nodule measuring approximately 4 mm in diameter (image 28, series 3). There are no definitive pleural calcifications.  There is partial atelectasis/collapse of the left lower lobe (image 41, series 3) and caudal segment of the lingula (image 40, series 3). The central pulmonary airways remain patent.  Nonspecific shotty mediastinal lymph nodes with index sub carinal nodal conglomeration measuring approximately 1.1 cm in greatest short axis diameter (image 29, series 2). Additional scattered shotty mediastinal and right hilar lymph nodes are individually not enlarged by size criteria.  Normal heart size. Coronary artery calcifications. No pericardial effusion. Calcifications within the aortic valve leaflets. There is mild fusiform ectasia of the ascending thoracic aorta measuring 38 mm in diameter. Conventional configuration of the aortic arch. The branch vessels of the aortic arch widely  patent throughout their imaged course.  Although this examination was not tailored for the evaluation of the pulmonary arteries, there are no discrete filling defects within the central pulmonary arterial tree to suggest central pulmonary embolism.  Limited evaluation of the upper abdomen is unremarkable.  No acute or aggressive osseous abnormalities.  Regional soft tissues appear normal. Normal appearance of the thyroid gland.  IMPRESSION:: IMPRESSION: 1. Interval increase in now moderate sized left-sided pleural effusion. The etiology of this effusion is not definitively depicted on this examination though there is abnormal asymmetric nodular pleural thickening along the mediastinum which is worrisome for malignancy. While a discrete pulmonary mass is not identified, evaluation is degraded secondary to partial atelectasis/collapse of the left lower lobe and lingula. Further evaluation with thoracentesis sending the acquired fluid for cytology could be performed as clinically indicated. 2.  Nonspecific shotty mediastinal lymph nodes with index sub carinal nodal conglomeration measuring 1.1 cm in diameter with differential considerations including reactive and malignant etiologies. 3. Coronary artery calcifications. 4. Calcifications within the aortic valve leaflets with associated mild ectasia of the ascending thoracic aorta measuring 38 mm in diameter. Further evaluation cardiac echo could be performed as clinically indicated. These results will be called to the ordering clinician or representative by the Radiologist Assistant, and communication documented in the PACS or zVision Dashboard.   Electronically Signed   By: Sandi Mariscal M.D.   On: 05/04/2014 17:31   US Thoracentesis Asp Pleural Space W/img Guide  05/12/2014   INDICATION: Smoker with dyspnea, new left pleural effusion; request is made for diagnostic and therapeutic left thoracentesis  EXAM: ULTRASOUND GUIDED DIAGNOSTIC AND THERAPEUTIC LEFT THORACENTESIS   COMPARISON:  None.  MEDICATIONS: None  COMPLICATIONS: None immediate  TECHNIQUE: Informed written consent was obtained from the patient after a discussion of the risks, benefits and alternatives to treatment. A timeout was performed prior to the initiation of the procedure.  Initial ultrasound scanning demonstrates a large left pleural effusion. The lower chest was prepped and draped in the usual sterile fashion. 1% lidocaine was used for local anesthesia.  An ultrasound image was saved for documentation purposes. An 6 Fr Safe-T-Centesis catheter was introduced. The thoracentesis was performed. The catheter was removed and a dressing was applied. The patient tolerated the procedure well without immediate post procedural complication. The patient was escorted to have an upright chest radiograph.  FINDINGS: A total of approximately 1.7 liters of turbid, amber fluid was removed. Requested samples were sent to the laboratory. Since this was the patient's first thoracentesis only the above amount of fluid was removed at this time.  IMPRESSION: Successful ultrasound-guided diagnostic and therapeutic left sided thoracentesis yielding 1.7 liters of pleural fluid.  Read by: Rowe Robert, PA-C   Electronically Signed   By: Jerilynn Mages.  Shick M.D.   On: 05/12/2014 13:26   I have independently reviewed the above radiologic studies.  Recent Lab Findings: Lab Results  Component Value Date   WBC 13.4* 05/30/2014   HGB 11.5* 05/30/2014   HCT 34.4* 05/30/2014   PLT 514* 05/30/2014   GLUCOSE 127* 05/30/2014   CHOL 203* 02/21/2014   TRIG 77.0 02/21/2014   HDL 43.60 02/21/2014   LDLDIRECT 148.2 04/06/2008   LDLCALC 144* 02/21/2014   ALT 16 05/26/2014   AST 14 05/26/2014   NA 134* 05/30/2014   K 3.8 05/30/2014   CL 105 05/30/2014   CREATININE 1.00 05/30/2014   BUN 26* 05/30/2014   CO2 20 05/30/2014   TSH 2.10 02/21/2014   INR 1.04 11/23/2012   HGBA1C 6.1 02/21/2014      Assessment / Plan:     Radidly recurring  left malignant pleural effusion without definite lung mass on ct, now presenting to ER with respiratory difficulty. Now with respiratory problems I have recommended that we get drainage and control of left plural effusion.  Will plan for early am.   The goals risks and alternatives of the planned surgical procedure left pleurix   have been discussed with the patient in detail. The risks of the procedure including death, infection, stroke, myocardial infarction, bleeding, blood transfusion have all been discussed specifically.  I have quoted Dyanne Carrel a 1 % of perioperative mortality and a complication rate as high as 10 %. The patient's questions have been answered.KINLEY FERRENTINO is willing  to proceed with the planned procedure.  I  spent 40 minutes counseling the patient face to face and 50% or more the  time was spent in counseling and coordination of care. The total time spent in the appointment was 55 minutes.    Grace Isaac MD      [address] [place] [address] Office [phone]   Beeper [phone]  05/30/2014 8:10 PM

## 2014-05-30 NOTE — Telephone Encounter (Signed)
He needs to have the PET scan tomorrow. If he gets admitted to the hospital, this may take another 2 weeks to be scheduled. Please ask him to wait until the PET scan is done unless he feels very bad.

## 2014-05-30 NOTE — H&P (Signed)
[facility] History and Physical  Ian Gonzales WUX:[id number] DOB: 1941-02-01 DOA: 05/30/2014  Referring physician: Noemi Chapel, MD PCP: Penni Homans, MD   Chief Complaint: Shortness of Breath  HPI: Ian Gonzales is a 74 y.o. male presents with shortness of breath. Patient recently was diagnosed with presumed lung cancer after he had a thoracentesis done which showed positive malignant cells. Patient was seen by Oncology and was scheduled for a PET scan in the morning but came in today because he was having increased breathlessness and also was having some pain in his chest. He had recently had the thoracentesis in March and now on the CXR in the ED there appears to be increased recurrence of the left sided pleural effusion. Currently he is comfortable he has good saturation and also has normal respiratory effort. CT surgery was called and they requested that he be transferred to [facility] for possible pleurex catheter placement and drainage of the effusion.   Review of Systems:  Constitutional:  No weight loss, night sweats, Fevers, chills, fatigue.  HEENT:  No headaches, No sneezing, itching, ear ache, nasal congestion, post nasal drip,  Cardio-vascular:  +chest pain, no swelling in lower extremities, anasarca, dizziness, palpitations  GI:  No heartburn, indigestion, abdominal pain, nausea, vomiting, diarrhea  Resp:  ++shortness of breath. no productive cough, No coughing up of blood.No change in color of mucus  Skin:  no rash or lesions.  GU:  no dysuria, change in color of urine, no urgency or frequency Musculoskeletal:  No joint pain or swelling. No decreased range of motion  Psych:  No change in mood or affect. No depression or anxiety.   Past Medical History  Diagnosis Date  . Hypertension   . GERD (gastroesophageal reflux disease)   . Colon polyps   . Back pain, lumbosacral   . Sinusitis acute 03/24/2011  . Memory loss 04/16/2011  . Allergic state 05/30/2011    . Leukocytosis 12/15/2011  . Hyperlipidemia 02/15/2012  . Unspecified sinusitis (chronic) 04/05/2012  . Epicondylitis 10/10/2012  . Shortness of breath     WITH EXERTION AND BACK PAIN  . Wound drainage     L ELBOW  . Spinal stenosis, lumbar   . Nocturia   . Headache 05/05/2014   Past Surgical History  Procedure Laterality Date  . Lumbar laminectomy  1974    twice  . Urethral stricture dilatation    . Colonoscopy    . Penile prosthesis implant    . Cataracts      REMOVED  . Decompressive lumbar laminectomy level 2 N/A 11/26/2012    Procedure: CENTRAL DECOMPRESSION LUMBAR LAMINECTOMY L3-L4,  L4-L5   ;  Surgeon: Tobi Bastos, MD;  Location: [facility];  Service: Orthopedics;  Laterality: N/A;   Social History:  reports that he has been smoking Cigarettes.  He has a 15.75 pack-year smoking history. He has quit using smokeless tobacco. His smokeless tobacco use included Chew. He reports that he drinks alcohol. He reports that he does not use illicit drugs.  Allergies  Allergen Reactions  . Ibuprofen Other (See Comments)    "It made me feel worse"  . Gabapentin     disequilibrium  . Valacyclovir Other (See Comments)    constipation    No family history on file.   Prior to Admission medications   Medication Sig Start Date End Date Taking? Authorizing Provider  acetaminophen (TYLENOL) 325 MG tablet Take 650 mg by mouth every morning.   Yes Historical Provider,  MD  amLODipine (NORVASC) 5 MG tablet Take 1 tablet (5 mg total) by mouth 2 (two) times daily. for high blood pressure 05/04/14  Yes Mosie Lukes, MD  aspirin 81 MG tablet Take 81 mg by mouth daily.   Yes Historical Provider, MD  naproxen sodium (ALEVE) 220 MG tablet Take 440 mg by mouth every morning.   Yes Historical Provider, MD  ranitidine (ZANTAC) 300 MG tablet take 1 tablet by mouth at bedtime 03/15/14  Yes Mosie Lukes, MD   Physical Exam: There were no vitals filed for this visit.  Wt Readings from Last 3  Encounters:  05/26/14 83.961 kg (185 lb 1.6 oz)  05/09/14 83.734 kg (184 lb 9.6 oz)  05/04/14 84.369 kg (186 lb)    General:  Appears calm and comfortable Eyes: PERRL, normal lids, irises & conjunctiva ENT: grossly normal hearing, lips & tongue Neck: no LAD, masses or thyromegaly Cardiovascular: RRR, no m/r/g. No LE edema. Respiratory:Normal respiratory effort. Diminished on the left chest Abdomen: soft, ntnd Skin: no rash or induration seen on limited exam Musculoskeletal: grossly normal tone BUE/BLE Psychiatric: grossly normal mood and affect, speech fluent and appropriate Neurologic: grossly non-focal.          Labs on Admission:  Basic Metabolic Panel:  Recent Labs Lab 05/26/14 1014 05/30/14 1629  NA 140 134*  K 4.2 3.8  CL  --  105  CO2 21* 20  GLUCOSE 119 127*  BUN 19.1 26*  CREATININE 0.8 1.00  CALCIUM 7.9* 8.7   Liver Function Tests:  Recent Labs Lab 05/26/14 1014  AST 14  ALT 16  ALKPHOS 89  BILITOT 0.31  PROT 7.2  ALBUMIN 3.2*   No results for input(s): LIPASE, AMYLASE in the last 168 hours. No results for input(s): AMMONIA in the last 168 hours. CBC:  Recent Labs Lab 05/26/14 1014 05/30/14 1629  WBC 14.3* 13.4*  NEUTROABS 10.9*  --   HGB 12.7* 11.5*  HCT 38.9 34.4*  MCV 91.4 91.7  PLT 564* 514*   Cardiac Enzymes: No results for input(s): CKTOTAL, CKMB, CKMBINDEX, TROPONINI in the last 168 hours.  BNP (last 3 results)  Recent Labs  05/30/14 1630  BNP 25.2    ProBNP (last 3 results) No results for input(s): PROBNP in the last 8760 hours.  CBG: No results for input(s): GLUCAP in the last 168 hours.  Radiological Exams on Admission: Dg Chest 2 View  05/30/2014   CLINICAL DATA:  Cough, congestion for 3 days.  EXAM: CHEST  2 VIEW  COMPARISON:  05/12/2014  FINDINGS: Moderate-large left pleural effusion with associated atelectasis. Clear right lung. No pneumothorax. Stable cardiomediastinal silhouette. No acute osseous abnormality.   IMPRESSION: Interval enlargement of a moderate-large left pleural effusion.   Electronically Signed   By: Kathreen Devoid   On: 05/30/2014 16:52      Assessment/Plan Active Problems:   Essential hypertension   Hyperlipidemia   Hyperglycemia   Lung mass   Recurrent left pleural effusion   1. Recurrent Left Pleural Effusion -will need CT surgery to evaluate -will likely need Pleurex drainage possible pleurodesis -currently he is compensated from a pulmonary perpective -will transfer to [facility] for CTS to evaluate  2. Hyperlipidemia -will check lipid profile -may need statins  3. Hyperglycemia -will monitor FSBS -will provide insulin coverage as needed  4. Lung Cancer -oncology to follow up  5. Hypertension -monitor pressures   Code Status: Full Code (must indicate code status--if unknown or must be presumed,  indicate so) DVT Prophylaxis:SCD Family Communication: None (indicate person spoken with, if applicable, with phone number if by telephone) Disposition Plan: Home (indicate anticipated LOS)  Time spent: 44mn  KHAN,SAADAT A [facility] Pager [phone]

## 2014-05-30 NOTE — ED Notes (Signed)
Continue to await [facility] to transport patient to [facility]

## 2014-05-30 NOTE — ED Notes (Signed)
Per Dr. Sabra Heck, cardio sx to see patient before [facility] to be called

## 2014-05-30 NOTE — ED Notes (Signed)
Patient transported to X-ray 

## 2014-05-31 ENCOUNTER — Encounter: Payer: Self-pay | Admitting: *Deleted

## 2014-05-31 ENCOUNTER — Encounter (HOSPITAL_COMMUNITY)
Admission: EM | Disposition: A | Discharge status: Home Health | Payer: Self-pay | Source: Home / Self Care | Attending: Internal Medicine

## 2014-05-31 ENCOUNTER — Inpatient Hospital Stay (HOSPITAL_COMMUNITY): Payer: Medicare Other | Admitting: Anesthesiology

## 2014-05-31 ENCOUNTER — Inpatient Hospital Stay (HOSPITAL_COMMUNITY): Payer: Medicare Other

## 2014-05-31 ENCOUNTER — Ambulatory Visit (HOSPITAL_COMMUNITY)
Admission: RE | Admit: 2014-05-31 | Discharge: 2014-05-31 | Disposition: A | Discharge status: Home / Self Care | Payer: Medicare Other | Source: Ambulatory Visit | Attending: Pulmonary Disease | Admitting: Pulmonary Disease

## 2014-05-31 ENCOUNTER — Telehealth: Payer: Self-pay | Admitting: *Deleted

## 2014-05-31 ENCOUNTER — Telehealth: Payer: Self-pay | Admitting: Internal Medicine

## 2014-05-31 ENCOUNTER — Encounter (HOSPITAL_COMMUNITY): Payer: Self-pay | Admitting: Anesthesiology

## 2014-05-31 DIAGNOSIS — J90 Pleural effusion, not elsewhere classified: Secondary | ICD-10-CM

## 2014-05-31 DIAGNOSIS — J910 Malignant pleural effusion: Secondary | ICD-10-CM | POA: Insufficient documentation

## 2014-05-31 HISTORY — PX: CHEST TUBE INSERTION: SHX231

## 2014-05-31 HISTORY — PX: PLEURAL EFFUSION DRAINAGE: SHX5099

## 2014-05-31 LAB — COMPREHENSIVE METABOLIC PANEL
ALK PHOS: 82 U/L (ref 39–117)
ALT: 18 U/L (ref 0–53)
ANION GAP: 10 (ref 5–15)
AST: 15 U/L (ref 0–37)
Albumin: 3 g/dL — ABNORMAL LOW (ref 3.5–5.2)
BILIRUBIN TOTAL: 0.6 mg/dL (ref 0.3–1.2)
BUN: 17 mg/dL (ref 6–23)
CO2: 23 mmol/L (ref 19–32)
Calcium: 9 mg/dL (ref 8.4–10.5)
Chloride: 103 mmol/L (ref 96–112)
Creatinine, Ser: 0.94 mg/dL (ref 0.50–1.35)
GFR calc Af Amer: >90 mL/min (ref >90)
GFR calc non Af Amer: 79 mL/min — ABNORMAL LOW (ref >90)
GLUCOSE: 104 mg/dL — AB (ref 70–99)
POTASSIUM: 4 mmol/L (ref 3.5–5.1)
Sodium: 136 mmol/L (ref 135–145)
Total Protein: 6.6 g/dL (ref 6.0–8.3)

## 2014-05-31 LAB — PROTIME-INR
INR: 1.13 (ref 0.00–1.49)
Prothrombin Time: 14.7 seconds (ref 11.6–15.2)

## 2014-05-31 LAB — TSH: TSH: 2.036 u[IU]/mL (ref 0.350–4.500)

## 2014-05-31 LAB — CBC
HCT: 34.3 % — ABNORMAL LOW (ref 39.0–52.0)
Hemoglobin: 11.4 g/dL — ABNORMAL LOW (ref 13.0–17.0)
MCH: 30.2 pg (ref 26.0–34.0)
MCHC: 33.2 g/dL (ref 30.0–36.0)
MCV: 91 fL (ref 78.0–100.0)
Platelets: 503 10*3/uL — ABNORMAL HIGH (ref 150–400)
RBC: 3.77 MIL/uL — ABNORMAL LOW (ref 4.22–5.81)
RDW: 13.3 % (ref 11.5–15.5)
WBC: 12.4 10*3/uL — AB (ref 4.0–10.5)

## 2014-05-31 LAB — APTT: APTT: 40 s — AB (ref 24–37)

## 2014-05-31 LAB — SURGICAL PCR SCREEN
MRSA, PCR: NEGATIVE
Staphylococcus aureus: NEGATIVE

## 2014-05-31 LAB — LIPID PANEL
CHOL/HDL RATIO: 4.1 ratio
CHOLESTEROL: 141 mg/dL (ref 0–200)
HDL: 34 mg/dL — ABNORMAL LOW (ref >39)
LDL Cholesterol: 87 mg/dL (ref 0–99)
Triglycerides: 98 mg/dL (ref <150)
VLDL: 20 mg/dL (ref 0–40)

## 2014-05-31 SURGERY — INSERTION, PLEURAL DRAINAGE CATHETER
Anesthesia: Monitor Anesthesia Care | Site: Chest | Laterality: Left

## 2014-05-31 MED ORDER — SODIUM CHLORIDE 0.9 % IV SOLN: 250.0000 mL | INTRAVENOUS | Status: DC | PRN

## 2014-05-31 MED ORDER — LIDOCAINE HCL 1 % IJ SOLN
INTRAMUSCULAR | Status: DC | PRN
  Administered 2014-05-31: 8 mL

## 2014-05-31 MED ORDER — ONDANSETRON HCL 4 MG/2ML IJ SOLN
INTRAMUSCULAR | Status: DC | PRN
  Administered 2014-05-31: 4 mg via INTRAVENOUS

## 2014-05-31 MED ORDER — PROPOFOL 10 MG/ML IV BOLUS
INTRAVENOUS | Status: AC
  Filled 2014-05-31: qty 20

## 2014-05-31 MED ORDER — AMLODIPINE BESYLATE 5 MG PO TABS
5.0000 mg | ORAL_TABLET | Freq: Two times a day (BID) | ORAL | Status: DC
  Administered 2014-05-31: 5 mg via ORAL
  Filled 2014-05-31 (×3): qty 1

## 2014-05-31 MED ORDER — SUCCINYLCHOLINE CHLORIDE 20 MG/ML IJ SOLN
INTRAMUSCULAR | Status: AC
  Filled 2014-05-31: qty 1

## 2014-05-31 MED ORDER — FENTANYL CITRATE (PF) 100 MCG/2ML IJ SOLN
INTRAMUSCULAR | Status: AC
  Filled 2014-05-31: qty 2

## 2014-05-31 MED ORDER — ACETAMINOPHEN 650 MG RE SUPP: 650.0000 mg | Freq: Four times a day (QID) | RECTAL | Status: DC | PRN

## 2014-05-31 MED ORDER — MIDAZOLAM HCL 2 MG/2ML IJ SOLN
INTRAMUSCULAR | Status: AC
  Filled 2014-05-31: qty 2

## 2014-05-31 MED ORDER — SODIUM CHLORIDE 0.9 % IJ SOLN
3.0000 mL | Freq: Two times a day (BID) | INTRAMUSCULAR | Status: DC
  Administered 2014-05-31: 3 mL via INTRAVENOUS

## 2014-05-31 MED ORDER — FAMOTIDINE 20 MG PO TABS
20.0000 mg | ORAL_TABLET | Freq: Every day | ORAL | Status: DC
  Filled 2014-05-31: qty 1

## 2014-05-31 MED ORDER — ASPIRIN 81 MG PO TABS: 81.0000 mg | ORAL_TABLET | Freq: Every day | ORAL | Status: AC

## 2014-05-31 MED ORDER — ONDANSETRON HCL 4 MG/2ML IJ SOLN
INTRAMUSCULAR | Status: AC
  Filled 2014-05-31: qty 2

## 2014-05-31 MED ORDER — GLYCOPYRROLATE 0.2 MG/ML IJ SOLN
INTRAMUSCULAR | Status: AC
  Filled 2014-05-31: qty 1

## 2014-05-31 MED ORDER — MIDAZOLAM HCL 5 MG/5ML IJ SOLN
INTRAMUSCULAR | Status: DC | PRN
  Administered 2014-05-31: 0.5 mg via INTRAVENOUS

## 2014-05-31 MED ORDER — FENTANYL CITRATE (PF) 100 MCG/2ML IJ SOLN
INTRAMUSCULAR | Status: DC | PRN
Start: 2014-05-31 — End: 2014-05-31
  Administered 2014-05-31: 25 ug via INTRAVENOUS

## 2014-05-31 MED ORDER — ONDANSETRON HCL 4 MG/2ML IJ SOLN: 4.0000 mg | Freq: Four times a day (QID) | INTRAMUSCULAR | Status: DC | PRN

## 2014-05-31 MED ORDER — FOLIC ACID 1 MG PO TABS
1.0000 mg | ORAL_TABLET | Freq: Every day | ORAL | Status: DC
  Filled 2014-05-31: qty 1

## 2014-05-31 MED ORDER — LIDOCAINE HCL (CARDIAC) 20 MG/ML IV SOLN
INTRAVENOUS | Status: AC
  Filled 2014-05-31: qty 5

## 2014-05-31 MED ORDER — PHENYLEPHRINE HCL 10 MG/ML IJ SOLN
INTRAMUSCULAR | Status: DC | PRN
  Administered 2014-05-31: 40 ug via INTRAVENOUS

## 2014-05-31 MED ORDER — DEXTROSE 5 % IV SOLN
1.5000 g | INTRAVENOUS | Status: AC
  Administered 2014-05-31: 1.5 g via INTRAVENOUS
  Filled 2014-05-31 (×2): qty 1.5

## 2014-05-31 MED ORDER — OXYCODONE HCL 5 MG PO TABS: 5.0000 mg | ORAL_TABLET | Freq: Once | ORAL | Status: DC | PRN

## 2014-05-31 MED ORDER — SODIUM CHLORIDE 0.9 % IJ SOLN
INTRAMUSCULAR | Status: AC
  Filled 2014-05-31: qty 10

## 2014-05-31 MED ORDER — OXYCODONE HCL 5 MG/5ML PO SOLN: 5.0000 mg | Freq: Once | ORAL | Status: DC | PRN

## 2014-05-31 MED ORDER — FENTANYL CITRATE (PF) 100 MCG/2ML IJ SOLN
25.0000 ug | INTRAMUSCULAR | Status: DC | PRN
  Administered 2014-05-31 (×3): 50 ug via INTRAVENOUS

## 2014-05-31 MED ORDER — NICOTINE 14 MG/24HR TD PT24: 14.0000 mg | MEDICATED_PATCH | Freq: Every day | TRANSDERMAL | Status: DC

## 2014-05-31 MED ORDER — PROPOFOL INFUSION 10 MG/ML OPTIME
INTRAVENOUS | Status: DC | PRN
  Administered 2014-05-31: 75 ug/kg/min via INTRAVENOUS

## 2014-05-31 MED ORDER — ADULT MULTIVITAMIN W/MINERALS CH
1.0000 | ORAL_TABLET | Freq: Every day | ORAL | Status: DC
  Filled 2014-05-31: qty 1

## 2014-05-31 MED ORDER — NAPROXEN SODIUM 220 MG PO TABS: 440.0000 mg | ORAL_TABLET | Freq: Every morning | ORAL | Status: DC

## 2014-05-31 MED ORDER — EPHEDRINE SULFATE 50 MG/ML IJ SOLN
INTRAMUSCULAR | Status: AC
  Filled 2014-05-31: qty 1

## 2014-05-31 MED ORDER — ACETAMINOPHEN 325 MG PO TABS: 650.0000 mg | ORAL_TABLET | Freq: Four times a day (QID) | ORAL | Status: DC | PRN

## 2014-05-31 MED ORDER — VITAMIN B-1 100 MG PO TABS
100.0000 mg | ORAL_TABLET | Freq: Every day | ORAL | Status: DC
  Filled 2014-05-31: qty 1

## 2014-05-31 MED ORDER — FENTANYL CITRATE (PF) 250 MCG/5ML IJ SOLN
INTRAMUSCULAR | Status: AC
  Filled 2014-05-31: qty 5

## 2014-05-31 MED ORDER — ROCURONIUM BROMIDE 50 MG/5ML IV SOLN
INTRAVENOUS | Status: AC
  Filled 2014-05-31: qty 1

## 2014-05-31 MED ORDER — SODIUM CHLORIDE 0.9 % IJ SOLN: 3.0000 mL | INTRAMUSCULAR | Status: DC | PRN

## 2014-05-31 MED ORDER — LIDOCAINE HCL (CARDIAC) 20 MG/ML IV SOLN
INTRAVENOUS | Status: DC | PRN
  Administered 2014-05-31: 50 mg via INTRAVENOUS

## 2014-05-31 MED ORDER — TRAMADOL HCL 50 MG PO TABS: 50.0000 mg | ORAL_TABLET | Freq: Three times a day (TID) | ORAL | Status: DC | PRN

## 2014-05-31 MED ORDER — AMLODIPINE BESYLATE 5 MG PO TABS: 5.0000 mg | ORAL_TABLET | Freq: Every day | ORAL | Status: DC

## 2014-05-31 MED ORDER — LACTATED RINGERS IV SOLN
INTRAVENOUS | Status: DC | PRN
  Administered 2014-05-31: 08:00:00 via INTRAVENOUS

## 2014-05-31 MED ORDER — CETYLPYRIDINIUM CHLORIDE 0.05 % MT LIQD
7.0000 mL | Freq: Two times a day (BID) | OROMUCOSAL | Status: DC
  Administered 2014-05-31: 7 mL via OROMUCOSAL

## 2014-05-31 MED ORDER — PHENYLEPHRINE 40 MCG/ML (10ML) SYRINGE FOR IV PUSH (FOR BLOOD PRESSURE SUPPORT)
PREFILLED_SYRINGE | INTRAVENOUS | Status: AC
  Filled 2014-05-31: qty 10

## 2014-05-31 MED ORDER — SODIUM CHLORIDE 0.9 % IJ SOLN: 3.0000 mL | Freq: Two times a day (BID) | INTRAMUSCULAR | Status: DC

## 2014-05-31 MED ORDER — ONDANSETRON HCL 4 MG PO TABS: 4.0000 mg | ORAL_TABLET | Freq: Four times a day (QID) | ORAL | Status: DC | PRN

## 2014-05-31 MED ORDER — OXYCODONE HCL 5 MG PO TABS
5.0000 mg | ORAL_TABLET | ORAL | Status: DC | PRN
  Administered 2014-05-31: 5 mg via ORAL
  Filled 2014-05-31: qty 1

## 2014-05-31 MED ORDER — ONDANSETRON HCL 4 MG/2ML IJ SOLN: 4.0000 mg | Freq: Once | INTRAMUSCULAR | Status: DC | PRN

## 2014-05-31 SURGICAL SUPPLY — 28 items
BRUSH SCRUB EZ PLAIN DRY (MISCELLANEOUS) ×4 IMPLANT
CANISTER SUCTION 2500CC (MISCELLANEOUS) ×2 IMPLANT
COVER SURGICAL LIGHT HANDLE (MISCELLANEOUS) ×2 IMPLANT
COVER TRANSDUCER ULTRASND GEL (DRAPE) ×2 IMPLANT
DERMABOND ADVANCED (GAUZE/BANDAGES/DRESSINGS) ×1
DERMABOND ADVANCED .7 DNX12 (GAUZE/BANDAGES/DRESSINGS) ×1 IMPLANT
DRAPE C-ARM 42X72 X-RAY (DRAPES) ×2 IMPLANT
DRAPE LAPAROSCOPIC ABDOMINAL (DRAPES) ×2 IMPLANT
DRSG TEGADERM 4X4.75 (GAUZE/BANDAGES/DRESSINGS) ×6 IMPLANT
GLOVE BIO SURGEON STRL SZ 6.5 (GLOVE) ×4 IMPLANT
GOWN STRL REUS W/ TWL LRG LVL3 (GOWN DISPOSABLE) ×2 IMPLANT
GOWN STRL REUS W/TWL LRG LVL3 (GOWN DISPOSABLE) ×2
KIT BASIN OR (CUSTOM PROCEDURE TRAY) ×2 IMPLANT
KIT PLEURX DRAIN CATH 1000ML (MISCELLANEOUS) ×6 IMPLANT
KIT PLEURX DRAIN CATH 15.5FR (DRAIN) ×2 IMPLANT
KIT ROOM TURNOVER OR (KITS) ×2 IMPLANT
NS IRRIG 1000ML POUR BTL (IV SOLUTION) ×2 IMPLANT
PACK GENERAL/GYN (CUSTOM PROCEDURE TRAY) ×2 IMPLANT
PAD ARMBOARD 7.5X6 YLW CONV (MISCELLANEOUS) ×4 IMPLANT
SET DRAINAGE LINE (MISCELLANEOUS) IMPLANT
SPONGE GAUZE 4X4 12PLY STER LF (GAUZE/BANDAGES/DRESSINGS) ×2 IMPLANT
SUT ETHILON 3 0 FSL (SUTURE) ×2 IMPLANT
SUT VIC AB 3-0 X1 27 (SUTURE) ×2 IMPLANT
SYR CONTROL 10ML LL (SYRINGE) ×2 IMPLANT
TOWEL OR 17X24 6PK STRL BLUE (TOWEL DISPOSABLE) ×2 IMPLANT
TOWEL OR 17X26 10 PK STRL BLUE (TOWEL DISPOSABLE) ×2 IMPLANT
VALVE REPLACEMENT CAP (MISCELLANEOUS) IMPLANT
WATER STERILE IRR 1000ML POUR (IV SOLUTION) ×2 IMPLANT

## 2014-05-31 NOTE — Anesthesia Preprocedure Evaluation (Addendum)
Anesthesia Evaluation  Patient identified by MRN, date of birth, ID band Patient awake    Reviewed: Allergy & Precautions, NPO status , Patient's Chart, lab work & pertinent test results  History of Anesthesia Complications Negative for: history of anesthetic complications  Airway Mallampati: II  TM Distance: >3 FB Neck ROM: Full    Dental  (+) Edentulous Upper, Edentulous Lower, Dental Advisory Given   Pulmonary shortness of breath, with exertion and at rest, COPDCurrent Smoker,    + decreased breath sounds      Cardiovascular hypertension, Pt. on medications Rhythm:Regular Rate:Normal     Neuro/Psych    GI/Hepatic Neg liver ROS, GERD-  Controlled,  Endo/Other  negative endocrine ROS  Renal/GU negative Renal ROS     Musculoskeletal   Abdominal   Peds  Hematology   Anesthesia Other Findings   Reproductive/Obstetrics                            Anesthesia Physical Anesthesia Plan  ASA: III  Anesthesia Plan: MAC   Post-op Pain Management:    Induction: Intravenous  Airway Management Planned: Simple Face Mask and Natural Airway  Additional Equipment:   Intra-op Plan:   Post-operative Plan:   Informed Consent: I have reviewed the patients History and Physical, chart, labs and discussed the procedure including the risks, benefits and alternatives for the proposed anesthesia with the patient or authorized representative who has indicated his/her understanding and acceptance.   Dental advisory given  Plan Discussed with: Anesthesiologist and CRNA  Anesthesia Plan Comments: (Lung Ca with L. Malignant pleural effusion Smoker Hypertension Low Back pain with spinal stenosis GERD )        Anesthesia Quick Evaluation

## 2014-05-31 NOTE — [facility] Oncology Navigator Note (Unsigned)
Received a phone call from [place], Museum/gallery curator.  Per Dr. Servando Snare, she request Mr. Whitehouse get transportation for PET scan at 12:00 noon.  I called [facility] and spoke with dispatch.  They are unable to transport out patients.  I called [facility] to ask for assistance.  They stated they will pick patient up at 12:00 in the PACU at [facility] and transport him to his PET scan.  I called PACU to update.  I also called [facility] Case Management to update.

## 2014-05-31 NOTE — Discharge Summary (Signed)
Physician Discharge Summary  Ian Gonzales ALP:[id number] DOB: 08/02/1940 DOA: 05/30/2014  PCP: Penni Homans, MD  Admit date: 05/30/2014 Discharge date: 05/31/2014  Time spent: < 30 minutes  Recommendations for Outpatient Follow-up:  Repeat CBC to follow Hgb and ensure no post procedure bleeding  Check BMET to follow electrolytes and renal function reasess BP and adjust medication as needed  Discharge Diagnoses:  Recurrent pleural effusion SOB Essential hypertension Lung mass GERD Tobacco abuse   Discharge Condition: stable and improved. Patient discharge to [facility] for PET Scan and subsequently home. Will follow with [facility] as previously instructed after scan. Will also arrange visit with PCP in 10 days.  Diet recommendation: heart healthy/low sodium  There were no vitals filed for this visit.  History of present illness:  74 y.o. male presents with shortness of breath. Patient recently was diagnosed with presumed lung cancer after he had a thoracentesis done which showed positive malignant cells. Patient was seen by Oncology and was scheduled for a PET scan on 4/20. Patient presented with symptoms of feeling breathlessness and also was having some pain in his chest. He had recently had the thoracentesis in March and now on the CXR in the ED there appears to be increased recurrence of the left sided pleural effusion. Currently he is comfortable he has good saturation and also has normal respiratory effort. CT surgery was called and they requested that he be transferred to [facility] for possible pleurodex catheter placement and drainage of the effusion.  Hospital Course:  1-SOB: due to recurrent pleural effusion -patient admitted for placement of pleurodex catheter; which was accomplished w/o complications -patient had 3L pleural fluid removed and catheter is now in place -[facility] arranged for assistance and care of tube -patient advise to quit smoking and will follow with Dr. Julien Nordmann for  options of treatment for his lung cancer -received prescription for tramadol to help with pain.  2-Lung cancer: will have PET Scan done after discharge today 4/20 and then will follow as instructed with oncology service  3-tobacco abuse: cessation counseling provided -patient received prescription for nicoderm patch  4-HTN: -discharge on amlodipine -patient advise to follow low sodium diet and to quit smoking  5-GERD: continue Zantac   *Rest of medical problems remains stable and plan is to continue current medication regimen and will follow with PCP for medication adjustments as needed  Procedures:  Placement of pleurodex catheter   Consultations:  CVTS (Dr. Servando Snare)  Discharge Exam: Filed Vitals:   05/31/14 1030  BP: 124/69  Pulse: 88  Temp:   Resp: 17    General: sore in his back after placement of pleuredex catheter; afebrile and breathing much better at discharge. No signs of overt bleeding and patient with no pneumothorax seen on post-procedure CXR Cardiovascular: S1 and S2, no rubs or gallops Respiratory: decreased BS at bases, fair air movement, no wheezing, scattered rhonchi  Discharge Instructions   Discharge Instructions    Diet - low sodium heart healthy    Complete by:  As directed      Discharge instructions    Complete by:  As directed   Take medications as prescribed Follow low sodium diet Stop smoking Follow up with Dr. Julien Nordmann as previously instructed Follow with PCP in 10 days          Current Discharge Medication List    START taking these medications   Details  nicotine (NICODERM CQ) 14 mg/24hr patch Place 1 patch (14 mg total) onto the skin daily. Qty:  28 patch, Refills: 0    traMADol (ULTRAM) 50 MG tablet Take 1 tablet (50 mg total) by mouth every 8 (eight) hours as needed for severe pain. Qty: 30 tablet, Refills: 0      CONTINUE these medications which have CHANGED   Details  amLODipine (NORVASC) 5 MG tablet Take 1 tablet (5  mg total) by mouth daily. for high blood pressure Qty: 60 tablet, Refills: 6   Associated Diagnoses: Essential hypertension    aspirin 81 MG tablet Take 1 tablet (81 mg total) by mouth daily. Hold until 06/01/14; due to pleurex catheter placement; then resume therapy as previously indictaed Qty: 30 tablet    naproxen sodium (ALEVE) 220 MG tablet Take 2 tablets (440 mg total) by mouth every morning. Hold until 06/01/14; due to pleurex catheter placement; then resume therapy as previously indictaed      CONTINUE these medications which have NOT CHANGED   Details  acetaminophen (TYLENOL) 325 MG tablet Take 650 mg by mouth every morning.    ranitidine (ZANTAC) 300 MG tablet take 1 tablet by mouth at bedtime Qty: 30 tablet, Refills: 5       Allergies  Allergen Reactions  . Ibuprofen Other (See Comments)    "It made me feel worse"  . Gabapentin     disequilibrium  . Valacyclovir Other (See Comments)    constipation   Follow-up Information    Follow up with Grace Isaac, MD. Schedule an appointment as soon as possible for a visit in 3 weeks.   Specialty:  Cardiothoracic Surgery   Contact information:   [address] [place] [address] [phone]       Follow up with Penni Homans, MD. Schedule an appointment as soon as possible for a visit in 10 days.   Specialty:  Family Medicine   Contact information:   [place] [address] [phone]       The results of significant diagnostics from this hospitalization (including imaging, microbiology, ancillary and laboratory) are listed below for reference.    Significant Diagnostic Studies: Dg Chest 1 View  05/12/2014   CLINICAL DATA:  Post left thoracentesis  EXAM: CHEST  1 VIEW  COMPARISON:  04/10/2014  FINDINGS: Cardiomediastinal silhouette is stable. There is small residual left pleural effusion with left basilar atelectasis or infiltrate. No pulmonary edema. No pneumothorax.  Right lung is clear.  IMPRESSION: Small residual left pleural effusion left basilar atelectasis or infiltrate. No pneumothorax.   Electronically Signed   By: Lahoma Crocker M.D.   On: 05/12/2014 11:28   Dg Chest 2 View  05/30/2014   CLINICAL DATA:  Cough, congestion for 3 days.  EXAM: CHEST  2 VIEW  COMPARISON:  05/12/2014  FINDINGS: Moderate-large left pleural effusion with associated atelectasis. Clear right lung. No pneumothorax. Stable cardiomediastinal silhouette. No acute osseous abnormality.  IMPRESSION: Interval enlargement of a moderate-large left pleural effusion.   Electronically Signed   By: Kathreen Devoid   On: 05/30/2014 16:52   Ct Head Wo Contrast  05/04/2014   CLINICAL DATA:  Headache. Visual changes, dizziness, posterior neck pain.  EXAM: CT HEAD WITHOUT CONTRAST  TECHNIQUE: Contiguous axial images were obtained from the base of the skull through the vertex without intravenous contrast.  COMPARISON:  None.  FINDINGS: Mild, likely age-appropriate atrophy with mild diffuse sulcal prominence. Scattered periventricular hypodensities compatible with microvascular ischemic disease. Bilateral basal ganglial calcifications. Gray-white differentiation is otherwise well maintained without CT  evidence of acute large territory infarct. No intraparenchymal or extra-axial mass or hemorrhage. Unchanged size and configuration of the ventricles and basilar cisterns. No midline shift. Intracranial atherosclerosis. Limited visualization the paranasal sinuses and mastoid air cells is normal. No air-fluid levels. Note is made of a punctate (approximately 0.9 x 0.7 cm) nodule within the subcutaneous tissues about the high right parietal occipital calvarium (image 28, series 2), too small to accurately characterize of favored to represent a sebaceous cyst. Post bilateral cataract surgery. No displaced calvarial fracture.  IMPRESSION: Mild, likely age-appropriate, atrophy and microvascular ischemic disease without acute  intracranial process.   Electronically Signed   By: Sandi Mariscal M.D.   On: 05/04/2014 17:18   Ct Chest W Contrast  05/04/2014   CLINICAL DATA:  History of left-sided pleural effusion. Shortness of breath. Chest pain. History of smoking.  EXAM: CT CHEST WITH CONTRAST  TECHNIQUE: Multidetector CT imaging of the chest was performed during intravenous contrast administration.  CONTRAST:  21m OMNIPAQUE IOHEXOL 300 MG/ML  SOLN  COMPARISON:  Chest radiograph - 03/21/2014; 09/05/2013  FINDINGS: Interval increase in size of now moderate left-sided pleural effusion. This finding is associated with abnormal nodular thickening of the left-sided mediastinal pleura with index area of nodular pleural thickening adjacent to the left main pulmonary artery measuring approximately 2.1 x 1.0 cm (image 25, series 2). There are scattered subpleural nodules about the nondependent portion of the left upper lobe with index nodule measuring approximately 4 mm in diameter (image 28, series 3). There are no definitive pleural calcifications.  There is partial atelectasis/collapse of the left lower lobe (image 41, series 3) and caudal segment of the lingula (image 40, series 3). The central pulmonary airways remain patent.  Nonspecific shotty mediastinal lymph nodes with index sub carinal nodal conglomeration measuring approximately 1.1 cm in greatest short axis diameter (image 29, series 2). Additional scattered shotty mediastinal and right hilar lymph nodes are individually not enlarged by size criteria.  Normal heart size. Coronary artery calcifications. No pericardial effusion. Calcifications within the aortic valve leaflets. There is mild fusiform ectasia of the ascending thoracic aorta measuring 38 mm in diameter. Conventional configuration of the aortic arch. The branch vessels of the aortic arch widely patent throughout their imaged course.  Although this examination was not tailored for the evaluation of the pulmonary arteries,  there are no discrete filling defects within the central pulmonary arterial tree to suggest central pulmonary embolism.  Limited evaluation of the upper abdomen is unremarkable.  No acute or aggressive osseous abnormalities.  Regional soft tissues appear normal. Normal appearance of the thyroid gland.  IMPRESSION:: IMPRESSION: 1. Interval increase in now moderate sized left-sided pleural effusion. The etiology of this effusion is not definitively depicted on this examination though there is abnormal asymmetric nodular pleural thickening along the mediastinum which is worrisome for malignancy. While a discrete pulmonary mass is not identified, evaluation is degraded secondary to partial atelectasis/collapse of the left lower lobe and lingula. Further evaluation with thoracentesis sending the acquired fluid for cytology could be performed as clinically indicated. 2. Nonspecific shotty mediastinal lymph nodes with index sub carinal nodal conglomeration measuring 1.1 cm in diameter with differential considerations including reactive and malignant etiologies. 3. Coronary artery calcifications. 4. Calcifications within the aortic valve leaflets with associated mild ectasia of the ascending thoracic aorta measuring 38 mm in diameter. Further evaluation cardiac echo could be performed as clinically indicated. These results will be called to the ordering clinician or representative by the Radiologist  Assistant, and communication documented in the PACS or zVision Dashboard.   Electronically Signed   By: Sandi Mariscal M.D.   On: 05/04/2014 17:31   Dg C-arm 1-60 Min-no Report  05/31/2014   CLINICAL DATA: pluerx catheter insertion   C-ARM 1-60 MINUTES  Fluoroscopy was utilized by the requesting physician.  No radiographic  interpretation.    US Thoracentesis Asp Pleural Space W/img Guide  05/12/2014   INDICATION: Smoker with dyspnea, new left pleural effusion; request is made for diagnostic and therapeutic left thoracentesis   EXAM: ULTRASOUND GUIDED DIAGNOSTIC AND THERAPEUTIC LEFT THORACENTESIS  COMPARISON:  None.  MEDICATIONS: None  COMPLICATIONS: None immediate  TECHNIQUE: Informed written consent was obtained from the patient after a discussion of the risks, benefits and alternatives to treatment. A timeout was performed prior to the initiation of the procedure.  Initial ultrasound scanning demonstrates a large left pleural effusion. The lower chest was prepped and draped in the usual sterile fashion. 1% lidocaine was used for local anesthesia.  An ultrasound image was saved for documentation purposes. An 6 Fr Safe-T-Centesis catheter was introduced. The thoracentesis was performed. The catheter was removed and a dressing was applied. The patient tolerated the procedure well without immediate post procedural complication. The patient was escorted to have an upright chest radiograph.  FINDINGS: A total of approximately 1.7 liters of turbid, amber fluid was removed. Requested samples were sent to the laboratory. Since this was the patient's first thoracentesis only the above amount of fluid was removed at this time.  IMPRESSION: Successful ultrasound-guided diagnostic and therapeutic left sided thoracentesis yielding 1.7 liters of pleural fluid.  Read by: Rowe Robert, PA-C   Electronically Signed   By: Jerilynn Mages.  Shick M.D.   On: 05/12/2014 13:26    Microbiology: Recent Results (from the past 240 hour(s))  Surgical pcr screen     Status: None   Collection Time: 05/31/14  1:49 AM  Result Value Ref Range Status   MRSA, PCR NEGATIVE NEGATIVE Final   Staphylococcus aureus NEGATIVE NEGATIVE Final    Comment:        The Xpert SA Assay (FDA approved for NASAL specimens in patients over 48 years of age), is one component of a comprehensive surveillance program.  Test performance has been validated by [facility] for patients greater than or equal to 20 year old. It is not intended to diagnose infection nor to guide or monitor  treatment.      Labs: Basic Metabolic Panel:  Recent Labs Lab 05/26/14 1014 05/30/14 1629 05/31/14 0452  NA 140 134* 136  K 4.2 3.8 4.0  CL  --  105 103  CO2 21* 20 23  GLUCOSE 119 127* 104*  BUN 19.1 26* 17  CREATININE 0.8 1.00 0.94  CALCIUM 7.9* 8.7 9.0   Liver Function Tests:  Recent Labs Lab 05/26/14 1014 05/31/14 0452  AST 14 15  ALT 16 18  ALKPHOS 89 82  BILITOT 0.31 0.6  PROT 7.2 6.6  ALBUMIN 3.2* 3.0*   CBC:  Recent Labs Lab 05/26/14 1014 05/30/14 1629 05/31/14 0452  WBC 14.3* 13.4* 12.4*  NEUTROABS 10.9*  --   --   HGB 12.7* 11.5* 11.4*  HCT 38.9 34.4* 34.3*  MCV 91.4 91.7 91.0  PLT 564* 514* 503*   BNP (last 3 results)  Recent Labs  05/30/14 1630  BNP 25.2     Signed:  Barton Dubois  [facility] 05/31/2014, 10:45 AM

## 2014-05-31 NOTE — Transfer of Care (Signed)
Immediate Anesthesia Transfer of Care Note  Patient: Ian Gonzales  Procedure(s) Performed: Procedure(s): INSERTION OF LEFT PLEURAL DRAINAGE CATHETER (Left) DRAINAGE OF LEFT PLEURAL EFFUSION (Left)  Patient Location: PACU  Anesthesia Type:MAC  Level of Consciousness: awake, oriented and patient cooperative  Airway & Oxygen Therapy: Patient Spontanous Breathing and Patient connected to nasal cannula oxygen  Post-op Assessment: Report given to RN and Post -op Vital signs reviewed and stable  Post vital signs: Reviewed  Last Vitals:  Filed Vitals:   05/31/14 0503  BP: 121/75  Pulse: 98  Temp: 36.8 C  Resp: 20    Complications: No apparent anesthesia complications

## 2014-05-31 NOTE — Telephone Encounter (Signed)
I noted patient was seen in the hospital yesterday.  Dr. Servando Snare placed pleur x cath.  I updated Dr. Julien Nordmann.  I called to check on patient with out an answer.

## 2014-05-31 NOTE — Telephone Encounter (Signed)
Called and confirmed 06/01/14 clinic appt w/ pt.

## 2014-05-31 NOTE — Progress Notes (Signed)
New Admission Note:   Arrival Method: via stretcher from [facility] Mental Orientation: Alert and Oriented x4 Telemetry: Placed on box 6E25 Per MD Order, NSR Assessment: Completed Skin: Intact, scratch/scabs to Right hand IV: Right Anterior Forearm Peripheral IV Pain:  C/o 6/10 Aching Left upper mid chest pain Tubes: N/A Safety Measures: Educated on fall prevention safety plan, patient acknowledged and understood. Admission: Completed [facility] [place] Orientation: Patient has been orientated to the room, unit and staff.  Family: N/A  Orders have been reviewed and implemented. Will continue to monitor the patient. Call light has been placed within reach and bed alarm has been activated.    Dorothea Glassman, RN  Phone number: [phone]

## 2014-05-31 NOTE — Progress Notes (Signed)
Report given to robin roberts rn as caregiver 

## 2014-05-31 NOTE — Brief Op Note (Signed)
      [address]       [place] [address]             [phone]      05/30/2014 - 05/31/2014  9:39 AM  PATIENT:  Ian Gonzales  74 y.o. male  PRE-OPERATIVE DIAGNOSIS:  MALIGNANT LEFT PLEURAL EFFUSION  POST-OPERATIVE DIAGNOSIS:  MALIGNANT LEFT PLEURAL EFFUSION  PROCEDURE:  Procedure(s): INSERTION OF LEFT PLEURAL DRAINAGE CATHETER (Left) DRAINAGE OF LEFT PLEURAL EFFUSION (Left)  SURGEON:  Surgeon(s) and Role:    * Grace Isaac, MD - Primary   ASSISTANTS: none   ANESTHESIA:   MAC  EBL:  Total I/O In: 200 [I.V.:200] Out: 3000 [Other:3000]  BLOOD ADMINISTERED:none  DRAINS: left pleurix cathater   LOCAL MEDICATIONS USED:  LIDOCAINE   SPECIMEN:  Source of Specimen:  left pleural fluid  3000 l of bloody fluid removed DISPOSITION OF SPECIMEN:  PATHOLOGY  COUNTS:  YES   DICTATION: .Dragon Dictation  PLAN OF CARE: Discharge to home after PACU  PATIENT DISPOSITION:  PACU - hemodynamically stable.   Delay start of Pharmacological VTE agent (>24hrs) due to surgical blood loss or risk of bleeding: yes

## 2014-05-31 NOTE — Telephone Encounter (Signed)
An inbnx message has been sent to dr Julien Nordmann to check on an available date and time

## 2014-05-31 NOTE — Anesthesia Procedure Notes (Signed)
Procedure Name: MAC Date/Time: 05/31/2014 8:35 AM Performed by: Jenne Campus Pre-anesthesia Checklist: Patient identified, Emergency Drugs available, Suction available, Patient being monitored and Timeout performed Patient Re-evaluated:Patient Re-evaluated prior to inductionOxygen Delivery Method: Simple face mask

## 2014-05-31 NOTE — Telephone Encounter (Signed)
Call received that the PET scan cannot be performed today and will be rescheduled.  Does MD F/U tomorrow need to be rescheduled?  Patient can be reached at [phone] or [phone].  Pet rescheduled for 06-08-2014.  Will notify Dr. Julien Nordmann.

## 2014-05-31 NOTE — Telephone Encounter (Signed)
Dr. Julien Nordmann aware of PET rescheduled.  Verbal order received and read back from Dr. Julien Nordmann for F/U visit with Dr. Julien Nordmann after PET.  P.O.F. Generated.

## 2014-05-31 NOTE — Progress Notes (Signed)
CM spoke with patient concerning discharge planning. Pt offered choice for [facility] for [facility] services upon discharge. Per pt choice [facility] to provide [facility] services.  [facility] rep Lelan Pons) contacted concerning new referral. Pt to discharge to [facility] for PET scan via [facility]. Pt request [facility] for DME management discharge. DME needs include pleurX supplies which were at pt bedside.

## 2014-05-31 NOTE — Progress Notes (Signed)
Per PACU RN, pt is being discharged from PACU, and case manager is setting up home health. PACU staff came up to gather pt. Belongings from room.  11:08 AM  05/31/2014  Penni Bombard, RN

## 2014-05-31 NOTE — Anesthesia Postprocedure Evaluation (Signed)
  Anesthesia Post-op Note  Patient: Ian Gonzales  Procedure(s) Performed: Procedure(s): INSERTION OF LEFT PLEURAL DRAINAGE CATHETER (Left) DRAINAGE OF LEFT PLEURAL EFFUSION (Left)  Patient Location: PACU  Anesthesia Type:MAC  Level of Consciousness: awake, alert  and oriented  Airway and Oxygen Therapy: Patient Spontanous Breathing and Patient connected to nasal cannula oxygen  Post-op Pain: mild  Post-op Assessment: Post-op Vital signs reviewed, Patient's Cardiovascular Status Stable, Respiratory Function Stable, Patent Airway and Pain level controlled  Post-op Vital Signs: stable  Last Vitals:  Filed Vitals:   05/31/14 1100  BP: 138/68  Pulse: 87  Temp: 36.8 C  Resp: 17    Complications: No apparent anesthesia complications

## 2014-05-31 NOTE — Telephone Encounter (Signed)
Called patient to follow up.  I informed him that he does not need to come to cancer center tomorrow.  He verbalized understanding.

## 2014-06-01 ENCOUNTER — Telehealth: Payer: Self-pay | Admitting: *Deleted

## 2014-06-01 ENCOUNTER — Ambulatory Visit: Payer: Medicare Other | Admitting: Physical Therapy

## 2014-06-01 ENCOUNTER — Telehealth: Payer: Self-pay | Admitting: Internal Medicine

## 2014-06-01 LAB — HEMOGLOBIN A1C
HEMOGLOBIN A1C: 6.4 % — AB (ref 4.8–5.6)
Mean Plasma Glucose: 137 mg/dL

## 2014-06-01 NOTE — Telephone Encounter (Signed)
Called and spoke with patient and he is aware of his 4/29 schedule

## 2014-06-01 NOTE — Telephone Encounter (Signed)
FYI called patient to follow-up on recent discharge from hospital.  Patient stated he had an appt on 5/24 and his appointments had been changed so he is getting confused and does not want to schedule one sooner.  He stated he has an appointment with Dr. Curt Bears 4/29 and a test 4/28.  He has a home health nurse coming to assess his chest tube.  Notified him to call with any concerns.

## 2014-06-01 NOTE — Telephone Encounter (Signed)
Transition Care Management Follow-up Telephone Call  How have you been since you were released from the hospital? "nothing to brag about, but feeling better"    Do you understand why you were in the hospital? YES- "I had fluid on my lungs and they had to take it off"    Do you understand the discharge instrcutions? YES   Items Reviewed:  Medications reviewed: YES   Allergies reviewed: YES   Dietary changes reviewed: YES - no changes   Referrals reviewed: YES    Functional Questionnaire:   Activities of Daily Living (ADLs):   He states they are independent in the following: "only thing I can't manage is my chest tube" - Home Health Nurse is to come to house and  States they require assistance with the following: none    Any transportation issues/concerns?: NO    Any patient concerns? NO    Confirmed importance and date/time of follow-up visits scheduled: YES- patient states he already has follow-up appointment with another doctor,  Would not like to schedule an appt at this time.     Confirmed with patient if condition begins to worsen call PCP or go to the ER.  Patient was given the Call-a-Nurse line [phone]: YES

## 2014-06-02 ENCOUNTER — Encounter (HOSPITAL_COMMUNITY): Payer: Self-pay | Admitting: Cardiothoracic Surgery

## 2014-06-02 DIAGNOSIS — C3490 Malignant neoplasm of unspecified part of unspecified bronchus or lung: Secondary | ICD-10-CM | POA: Diagnosis not present

## 2014-06-05 ENCOUNTER — Other Ambulatory Visit (HOSPITAL_COMMUNITY): Payer: Medicare Other

## 2014-06-05 ENCOUNTER — Telehealth: Payer: Self-pay | Admitting: *Deleted

## 2014-06-05 DIAGNOSIS — I10 Essential (primary) hypertension: Secondary | ICD-10-CM

## 2014-06-05 NOTE — Op Note (Signed)
NAME:  Ian Gonzales, GORRELL NO.:  [id number]  MEDICAL RECORD NO.:  [id number]  LOCATION:  XRAY                         FACILITY:  [facility]  PHYSICIAN:  Lanelle Bal, MD    DATE OF BIRTH:  09-02-1940  DATE OF PROCEDURE:  06/05/2014 DATE OF DISCHARGE:                              OPERATIVE REPORT   PREOPERATIVE DIAGNOSIS:  Recurrent large left pleural effusion, suspicious for malignancy.  POSTOPERATIVE DIAGNOSIS:  Recurrent large left pleural effusion, suspicious for malignancy.  SURGICAL PROCEDURE:  Drainage of large left pleural effusion with ultrasound and fluoro guidance and placement of PleurX catheter.  SURGEON:  Lanelle Bal, MD.  BRIEF HISTORY:  The patient is a 74 year old male, who presented in early April to the Pulmonary Service with a large left pleural effusion. 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  The patient was referred to the [facility] but presented to the emergency room with rapidly increasing left pleural effusion and respiratory insufficiency.  Urgent consultation of Thoracic Surgery was done.  The patient was seen and recommended emergent drainage.  The patient has a PET scan scheduled and further biopsies to be done following the PET scan and re-evaluation of the left pleural fluid.  DESCRIPTION OF PROCEDURE:  Under MAC anesthesia, the patient's left chest which had preoperatively been marked, was prepped with Betadine and draped in sterile manner.  Appropriate time-out was performed. Using a SonoSite ultrasound guidance,  a pleural fluid was easily located.  Lidocaine 1% was infiltrated, a total of 8 mL along the left lateral chest wall.  A 16-gauge needle was introduced into the left chest with return of bloody pleural fluid.  The guidewire was placed into the chest under fluoroscopic guidance.  Counter incision was made slightly anterior and a PleurX catheter tunneled between the 2 incisions.   Serial dilators were placed over the guidewire and a peel- away sheath was introduced into the left chest.  The PleurX catheter was then placed into the left chest and 3 L of bloody pericardial effusion were removed and submitted to Pathology for cytologic block.  The patient had good relief of his dyspnea symptoms and was discharged from the recovery room as an outpatient to go to [facility] for his previously scheduled PET scan.  Sponge and needle count was reported as correct at the completion of procedure.  The patient tolerated the procedure without complication.  Follow up chest x-ray was performed and showed good clearance of the fluid.     Lanelle Bal, MD    EG/MEDQ  D:  06/05/2014  T:  06/05/2014  Job:  [id number]

## 2014-06-05 NOTE — Telephone Encounter (Signed)
TCM call previously made (please see previous phone note), but patient declined office visit.  Called patient to see if he would be agreeable to labs, and patient stated he would come for lab appointment.  Appointment scheduled tomorrow 06/06/14 and labs ordered.

## 2014-06-06 ENCOUNTER — Other Ambulatory Visit (INDEPENDENT_AMBULATORY_CARE_PROVIDER_SITE_OTHER): Payer: Medicare Other

## 2014-06-06 DIAGNOSIS — I10 Essential (primary) hypertension: Secondary | ICD-10-CM | POA: Diagnosis not present

## 2014-06-06 DIAGNOSIS — E875 Hyperkalemia: Secondary | ICD-10-CM

## 2014-06-07 ENCOUNTER — Telehealth: Payer: Self-pay | Admitting: *Deleted

## 2014-06-07 NOTE — Telephone Encounter (Signed)
Called patient to follow up on how he was doing.  I was unable to leave a vm message.

## 2014-06-08 ENCOUNTER — Ambulatory Visit (HOSPITAL_COMMUNITY)
Admission: RE | Admit: 2014-06-08 | Discharge: 2014-06-08 | Disposition: A | Discharge status: Home / Self Care | Payer: Medicare Other | Source: Ambulatory Visit | Attending: Pulmonary Disease | Admitting: Pulmonary Disease

## 2014-06-08 ENCOUNTER — Encounter (HOSPITAL_COMMUNITY): Payer: Medicare Other

## 2014-06-08 DIAGNOSIS — C782 Secondary malignant neoplasm of pleura: Secondary | ICD-10-CM

## 2014-06-08 DIAGNOSIS — I639 Cerebral infarction, unspecified: Secondary | ICD-10-CM | POA: Diagnosis not present

## 2014-06-08 DIAGNOSIS — J910 Malignant pleural effusion: Secondary | ICD-10-CM

## 2014-06-08 DIAGNOSIS — R4182 Altered mental status, unspecified: Secondary | ICD-10-CM | POA: Diagnosis not present

## 2014-06-08 LAB — GLUCOSE, CAPILLARY: Glucose-Capillary: 109 mg/dL — ABNORMAL HIGH (ref 70–99)

## 2014-06-08 LAB — CBC WITH DIFFERENTIAL/PLATELET
BASOS PCT: 0.1 % (ref 0.0–3.0)
Basophils Absolute: 0 10*3/uL (ref 0.0–0.1)
EOS PCT: 0.7 % (ref 0.0–5.0)
Eosinophils Absolute: 0.1 10*3/uL (ref 0.0–0.7)
HCT: 35.3 % — ABNORMAL LOW (ref 39.0–52.0)
Hemoglobin: 11.7 g/dL — ABNORMAL LOW (ref 13.0–17.0)
Lymphocytes Relative: 13.2 % (ref 12.0–46.0)
Lymphs Abs: 2 10*3/uL (ref 0.7–4.0)
MCHC: 33.2 g/dL (ref 30.0–36.0)
MCV: 92.3 fl (ref 78.0–100.0)
MONO ABS: 0.2 10*3/uL (ref 0.1–1.0)
Monocytes Relative: 1.6 % — ABNORMAL LOW (ref 3.0–12.0)
Neutro Abs: 13 10*3/uL — ABNORMAL HIGH (ref 1.4–7.7)
Neutrophils Relative %: 84.4 % — ABNORMAL HIGH (ref 43.0–77.0)
Platelets: 558 10*3/uL — ABNORMAL HIGH (ref 150.0–400.0)
RBC: 3.82 Mil/uL — AB (ref 4.22–5.81)
RDW: 13.8 % (ref 11.5–15.5)
WBC: 15.4 10*3/uL — AB (ref 4.0–10.5)

## 2014-06-08 LAB — BASIC METABOLIC PANEL
BUN: 17 mg/dL (ref 6–23)
CO2: 21 mEq/L (ref 19–32)
Calcium: 9.2 mg/dL (ref 8.4–10.5)
Chloride: 101 mEq/L (ref 96–112)
Creatinine, Ser: 0.81 mg/dL (ref 0.40–1.50)
GFR: 98.43 mL/min (ref >60.00)
Glucose, Bld: 60 mg/dL — ABNORMAL LOW (ref 70–99)
POTASSIUM: 6.1 meq/L — AB (ref 3.5–5.1)
SODIUM: 133 meq/L — AB (ref 135–145)

## 2014-06-08 MED ORDER — FLUDEOXYGLUCOSE F - 18 (FDG) INJECTION
9.7000 | Freq: Once | INTRAVENOUS | Status: AC | PRN
  Administered 2014-06-08: 9.7 via INTRAVENOUS

## 2014-06-08 MED ORDER — SODIUM POLYSTYRENE SULFONATE 15 GM/60ML PO SUSP: 15.0000 g | Freq: Once | ORAL | Status: DC

## 2014-06-08 NOTE — Addendum Note (Signed)
Addended by: Leticia Penna A on: 06/08/2014 03:07 PM   Modules accepted: Orders

## 2014-06-09 ENCOUNTER — Encounter: Payer: Self-pay | Admitting: *Deleted

## 2014-06-09 ENCOUNTER — Other Ambulatory Visit (INDEPENDENT_AMBULATORY_CARE_PROVIDER_SITE_OTHER): Payer: Medicare Other

## 2014-06-09 ENCOUNTER — Ambulatory Visit (HOSPITAL_BASED_OUTPATIENT_CLINIC_OR_DEPARTMENT_OTHER): Payer: Medicare Other | Admitting: Internal Medicine

## 2014-06-09 ENCOUNTER — Encounter: Payer: Self-pay | Admitting: Internal Medicine

## 2014-06-09 ENCOUNTER — Telehealth: Payer: Self-pay | Admitting: *Deleted

## 2014-06-09 ENCOUNTER — Telehealth: Payer: Self-pay | Admitting: Internal Medicine

## 2014-06-09 VITALS — BP 154/62 | HR 98 | Temp 98.2°F | Resp 18 | Ht 66.0 in | Wt 179.5 lb

## 2014-06-09 DIAGNOSIS — E875 Hyperkalemia: Secondary | ICD-10-CM

## 2014-06-09 DIAGNOSIS — J90 Pleural effusion, not elsewhere classified: Secondary | ICD-10-CM | POA: Diagnosis not present

## 2014-06-09 DIAGNOSIS — R918 Other nonspecific abnormal finding of lung field: Secondary | ICD-10-CM | POA: Diagnosis present

## 2014-06-09 LAB — BASIC METABOLIC PANEL
BUN: 21 mg/dL (ref 6–23)
CHLORIDE: 101 meq/L (ref 96–112)
CO2: 24 mEq/L (ref 19–32)
Calcium: 9.5 mg/dL (ref 8.4–10.5)
Creatinine, Ser: 1.22 mg/dL (ref 0.40–1.50)
GFR: 61.36 mL/min (ref >60.00)
GLUCOSE: 103 mg/dL — AB (ref 70–99)
POTASSIUM: 4.4 meq/L (ref 3.5–5.1)
Sodium: 134 mEq/L — ABNORMAL LOW (ref 135–145)

## 2014-06-09 NOTE — Telephone Encounter (Signed)
Discussed with Diane, RN with Dr. Julien Nordmann. Will need to refer [facility] to Dr. Everrett Coombe office as he was the surgeon involved with placing pleuryx drain on 05/31/14.  VM left for Nino Parsley @ [facility] to contact Dr. Servando Snare @ [phone]

## 2014-06-09 NOTE — Progress Notes (Signed)
[place] [phone]   [phone]  OFFICE PROGRESS NOTE  Penni Homans, MD [place] [address]  DIAGNOSIS: Highly suspicious metastatic lung cancer pending tissue diagnosis  PRIOR THERAPY: Status post Pleurx catheter placement by Dr. Servando Snare on 06/05/2014.  CURRENT THERAPY: None.  INTERVAL HISTORY: Ian Gonzales 74 y.o. male returns to the clinic today for follow-up visit. The patient is feeling fine today with no specific complaints except for mild shortness of breath and cough. He recently underwent drainage of large left pleural effusion with ultrasound and placement of Pleurx catheter under the care of Dr. Servando Snare. They didn't see total of bloody pleural effusion. The final cytology is still pending. The cytology from the previous pleural effusion was unable to confirm the diagnosis of malignancy. The patient is here today for reevaluation and discussion of his treatment options. He denied having any significant fever or chills, he has no nausea or vomiting. The patient denied having any significant weight loss or night sweats.   MEDICAL HISTORY: Past Medical History  Diagnosis Date  . Hypertension   . GERD (gastroesophageal reflux disease)   . Colon polyps   . Back pain, lumbosacral   . Sinusitis acute 03/24/2011  . Memory loss 04/16/2011  . Allergic state 05/30/2011  . Leukocytosis 12/15/2011  . Hyperlipidemia 02/15/2012  . Unspecified sinusitis (chronic) 04/05/2012  . Epicondylitis 10/10/2012  . Shortness of breath     WITH EXERTION AND BACK PAIN  . Wound drainage     L ELBOW  . Spinal stenosis, lumbar   . Nocturia   . Headache 05/05/2014    ALLERGIES:  is allergic to ibuprofen; gabapentin; and valacyclovir.  MEDICATIONS:  Current Outpatient Prescriptions  Medication Sig Dispense Refill  . acetaminophen (TYLENOL) 325 MG tablet Take 650 mg by mouth every morning.    Marland Kitchen amLODipine (NORVASC) 5 MG  tablet Take 1 tablet (5 mg total) by mouth daily. for high blood pressure 60 tablet 6  . aspirin 81 MG tablet Take 1 tablet (81 mg total) by mouth daily. Hold until 06/01/14; due to pleurex catheter placement; then resume therapy as previously indictaed 30 tablet   . naproxen sodium (ALEVE) 220 MG tablet Take 2 tablets (440 mg total) by mouth every morning. Hold until 06/01/14; due to pleurex catheter placement; then resume therapy as previously indictaed    . ranitidine (ZANTAC) 300 MG tablet take 1 tablet by mouth at bedtime 30 tablet 5  . traMADol (ULTRAM) 50 MG tablet Take 1 tablet (50 mg total) by mouth every 8 (eight) hours as needed for severe pain. 30 tablet 0  . nicotine (NICODERM CQ) 14 mg/24hr patch Place 1 patch (14 mg total) onto the skin daily. (Patient not taking: Reported on 06/01/2014) 28 patch 0  . sodium polystyrene (KAYEXALATE) 15 GM/60ML suspension Take 60 mLs (15 g total) by mouth once. (Patient not taking: Reported on 06/09/2014) 60 mL 0   No current facility-administered medications for this visit.    SURGICAL HISTORY:  Past Surgical History  Procedure Laterality Date  . Lumbar laminectomy  1974    twice  . Urethral stricture dilatation    . Colonoscopy    . Penile prosthesis implant    . Cataracts      REMOVED  . Decompressive lumbar laminectomy level 2 N/A 11/26/2012    Procedure: CENTRAL DECOMPRESSION LUMBAR LAMINECTOMY L3-L4,  L4-L5   ;  Surgeon: Tobi Bastos,  MD;  Location: [facility];  Service: Orthopedics;  Laterality: N/A;  . Chest tube insertion Left 05/31/2014    Procedure: INSERTION OF LEFT PLEURAL DRAINAGE CATHETER;  Surgeon: Grace Isaac, MD;  Location: [place];  Service: Thoracic;  Laterality: Left;  . Pleural effusion drainage Left 05/31/2014    Procedure: DRAINAGE OF LEFT PLEURAL EFFUSION;  Surgeon: Grace Isaac, MD;  Location: [place];  Service: Thoracic;  Laterality: Left;    REVIEW OF SYSTEMS:  Constitutional: positive for fatigue Eyes:  negative Ears, nose, mouth, throat, and face: negative Respiratory: positive for dyspnea on exertion Cardiovascular: negative Gastrointestinal: negative Genitourinary:negative Integument/breast: negative Hematologic/lymphatic: negative Musculoskeletal:negative Neurological: negative Behavioral/Psych: negative Endocrine: negative Allergic/Immunologic: negative   PHYSICAL EXAMINATION: General appearance: alert, cooperative, fatigued and no distress Head: Normocephalic, without obvious abnormality, atraumatic Neck: no adenopathy, no JVD, supple, symmetrical, trachea midline and thyroid not enlarged, symmetric, no tenderness/mass/nodules Lymph nodes: Cervical, supraclavicular, and axillary nodes normal. Resp: clear to auscultation bilaterally Back: symmetric, no curvature. ROM normal. No CVA tenderness. Cardio: regular rate and rhythm, S1, S2 normal, no murmur, click, rub or gallop GI: soft, non-tender; bowel sounds normal; no masses,  no organomegaly Extremities: extremities normal, atraumatic, no cyanosis or edema Neurologic: Alert and oriented X 3, normal strength and tone. Normal symmetric reflexes. Normal coordination and gait  ECOG PERFORMANCE STATUS: 1 - Symptomatic but completely ambulatory  Blood pressure 154/62, pulse 98, temperature 98.2 F (36.8 C), temperature source Oral, resp. rate 18, height '5\' 6"'$  (1.676 m), weight 179 lb 8 oz (81.421 kg), SpO2 97 %.  LABORATORY DATA: Lab Results  Component Value Date   WBC 15.4* 06/07/2014   HGB 11.7* 06/07/2014   HCT 35.3* 06/07/2014   MCV 92.3 06/07/2014   PLT 558.0* 06/07/2014      Chemistry      Component Value Date/Time   NA 133* 06/07/2014 1613   NA 140 05/26/2014 1014   K 6.1* 06/07/2014 1613   K 4.2 05/26/2014 1014   CL 101 06/07/2014 1613   CO2 21 06/07/2014 1613   CO2 21* 05/26/2014 1014   BUN 17 06/07/2014 1613   BUN 19.1 05/26/2014 1014   CREATININE 0.81 06/07/2014 1613   CREATININE 0.8 05/26/2014 1014    CREATININE 0.77 05/04/2014 1458      Component Value Date/Time   CALCIUM 9.2 06/07/2014 1613   CALCIUM 7.9* 05/26/2014 1014   ALKPHOS 82 05/31/2014 0452   ALKPHOS 89 05/26/2014 1014   AST 15 05/31/2014 0452   AST 14 05/26/2014 1014   ALT 18 05/31/2014 0452   ALT 16 05/26/2014 1014   BILITOT 0.6 05/31/2014 0452   BILITOT 0.31 05/26/2014 1014       RADIOGRAPHIC STUDIES: Dg Chest 1 View  05/12/2014   CLINICAL DATA:  Post left thoracentesis  EXAM: CHEST  1 VIEW  COMPARISON:  04/10/2014  FINDINGS: Cardiomediastinal silhouette is stable. There is small residual left pleural effusion with left basilar atelectasis or infiltrate. No pulmonary edema. No pneumothorax. Right lung is clear.  IMPRESSION: Small residual left pleural effusion left basilar atelectasis or infiltrate. No pneumothorax.   Electronically Signed   By: Lahoma Crocker M.D.   On: 05/12/2014 11:28   Dg Chest 2 View  05/30/2014   CLINICAL DATA:  Cough, congestion for 3 days.  EXAM: CHEST  2 VIEW  COMPARISON:  05/12/2014  FINDINGS: Moderate-large left pleural effusion with associated atelectasis. Clear right lung. No pneumothorax. Stable cardiomediastinal silhouette. No acute osseous abnormality.  IMPRESSION: Interval  enlargement of a moderate-large left pleural effusion.   Electronically Signed   By: Kathreen Devoid   On: 05/30/2014 16:52   Nm Pet Image Initial (pi) Skull Base To Thigh  06/08/2014   CLINICAL DATA:  Initial treatment strategy for adenocarcinoma in pleural fluid. Recent left chest tube placement. Shortness of breath.  EXAM: NUCLEAR MEDICINE PET SKULL BASE TO THIGH  TECHNIQUE: 9.7 mCi F-18 FDG was injected intravenously. Full-ring PET imaging was performed from the skull base to thigh after the radiotracer. CT data was obtained and used for attenuation correction and anatomic localization.  FASTING BLOOD GLUCOSE:  Value: 109 mg/dl  COMPARISON:  CT chest dated 05/04/2014  FINDINGS: NECK  1.8 cm partially calcified right level II  cervical node (series 4/ image 27), max SUV 16.2.  Mild associated hypermetabolism in the right posterior hypopharynx, max SUV 7.5, although without definite mass on CT.  CHEST  Moderate left pleural effusion, partially loculated, with associated indwelling pleural catheter.  Multifocal pleural-based hypermetabolism, max SUV 7.5 along the medial left heart border and 10.9 along the medial left lower lobe/infrahilar region. Associated lingular and left lower lobe atelectasis/collapse.  Additional pleural-based nodularity along the left fissure (series 6/ image 48), measuring up to 11 mm, max SUV 5.1.  Biapical pleural parenchymal scarring. Underlying mild to moderate paraseptal emphysematous changes.  Right lungs otherwise clear.  No pneumothorax.  ABDOMEN/PELVIS  No abnormal hypermetabolic activity within the liver, pancreas, or spleen.  Low-density nodular thickening of the left adrenal gland, measuring -8 HUs on unenhanced CT, compatible with a benign adrenal adenoma. Associated mild hypermetabolism, max SUV 4.1.  No hypermetabolic lymph nodes in the abdomen or pelvis.  SKELETON  Multifocal osseous metastases, including:  --Left T5 vertebral body, max SUV 6.7  --Left T7 vertebral body, max SUV 8.1  Mild focal hypermetabolism in the left posterior paraspinous musculature, max SUV 5.0 (PET image 57).  IMPRESSION: Moderate loculated left pleural effusion with indwelling pleural catheter.  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  Focal hypermetabolism in the medial left lower lobe/infrahilar region could reflect the primary lesion, although this is poorly evaluated.  Osseous metastases, as above. Intramuscular metastasis in the left posterior back.  Hypermetabolic, partially calcified right level II cervical node. Metastasis is not excluded, although this appearance/distribution is unusual. Consider biopsy and/or surgical  excision.  Associated asymmetric hypermetabolism along the posterior right hypopharynx, without definite mass on CT. Consider visual inspection to exclude primary head/neck cancer.   Electronically Signed   By: Julian Hy M.D.   On: 06/08/2014 16:20   Dg Chest Port 1 View  05/31/2014   CLINICAL DATA:  Pleural catheter insertion.  EXAM: PORTABLE CHEST - 1 VIEW  COMPARISON:  05/30/2014  FINDINGS: Small catheter seen at the left lung base. There has been almost complete evacuation of the large left pleural effusion. No pneumothorax. Heart size and vascularity are normal. Tortuosity and calcification of the thoracic aorta. Slight scarring at the right apex. Minimal atelectasis at the right lung base.  IMPRESSION: No pneumothorax after a left pleural catheter insertion. Minimal residual left effusion. Slight atelectasis at the right lung base.   Electronically Signed   By: Lorriane Shire M.D.   On: 05/31/2014 10:51   Dg C-arm 1-60 Min-no Report  05/31/2014   CLINICAL DATA: pluerx catheter insertion   C-ARM 1-60 MINUTES  Fluoroscopy was utilized by the requesting physician.  No radiographic  interpretation.    US Thoracentesis  Asp Pleural Space W/img Guide  05/12/2014   INDICATION: Smoker with dyspnea, new left pleural effusion; request is made for diagnostic and therapeutic left thoracentesis  EXAM: ULTRASOUND GUIDED DIAGNOSTIC AND THERAPEUTIC LEFT THORACENTESIS  COMPARISON:  None.  MEDICATIONS: None  COMPLICATIONS: None immediate  TECHNIQUE: Informed written consent was obtained from the patient after a discussion of the risks, benefits and alternatives to treatment. A timeout was performed prior to the initiation of the procedure.  Initial ultrasound scanning demonstrates a large left pleural effusion. The lower chest was prepped and draped in the usual sterile fashion. 1% lidocaine was used for local anesthesia.  An ultrasound image was saved for documentation purposes. An 6 Fr Safe-T-Centesis catheter  was introduced. The thoracentesis was performed. The catheter was removed and a dressing was applied. The patient tolerated the procedure well without immediate post procedural complication. The patient was escorted to have an upright chest radiograph.  FINDINGS: A total of approximately 1.7 liters of turbid, amber fluid was removed. Requested samples were sent to the laboratory. Since this was the patient's first thoracentesis only the above amount of fluid was removed at this time.  IMPRESSION: Successful ultrasound-guided diagnostic and therapeutic left sided thoracentesis yielding 1.7 liters of pleural fluid.  Read by: Rowe Robert, PA-C   Electronically Signed   By: Jerilynn Mages.  Shick M.D.   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 Unfortunately the previous cytology from the pleural fluids did not confirm his final diagnosis. I had a lengthy discussion with the patient today about his condition. I recommended for him referral to interventional radiology for consideration of CT-guided core biopsy of the left anterior pleural lesion. I will also arrange for the patient to reschedule his MRI of the brain to rule out brain metastasis. I will see him back for follow-up visit in 2 weeks for reevaluation and discussion of his treatment options based on the final pathology. The patient was advised to call immediately if he has any concerning symptoms in the interval. The patient voices understanding of current disease status and treatment options and is in agreement with the current care plan.  All questions were answered. The patient knows to call the clinic with any problems, questions or concerns. We can certainly see the patient much sooner if necessary.  I spent 15 minutes counseling the patient face to face. The total time spent in the appointment was 25  minutes.  Disclaimer: This note was dictated with voice recognition software. Similar sounding words can inadvertently be transcribed and may not be corrected upon review.

## 2014-06-09 NOTE — Telephone Encounter (Signed)
TC from Nino Parsley [facility] regarding pt's pleuryx drain. She is inquiring about whether the schedule for draining his pleuryx has changed. She is aware that pt saw Dr. Julien Nordmann today. She states that his current schedule is every other day but is getting only approx. 60 mls each time. Please return call to Manuela Schwartz @ [phone] ext [phone]. If no answer left voice mail.

## 2014-06-09 NOTE — Telephone Encounter (Signed)
Gave avs & calendar for May MRI. Sent RN message to verify MD appointment.

## 2014-06-09 NOTE — [facility] Oncology Navigator Note (Unsigned)
Spoke with patient today during appt.  He was confused about appt.  He needs MRI Brain and biopsy.  I explained his upcoming test.  I walked him to scheduling and ask them to schedule his scan and biopsy.  I will follow up on scheduling.

## 2014-06-11 ENCOUNTER — Inpatient Hospital Stay (HOSPITAL_COMMUNITY)
Admission: EM | Admit: 2014-06-11 | Discharge: 2014-06-17 | DRG: 065 | Disposition: A | Discharge status: Skilled Nursing Facility | Payer: Medicare Other | Attending: Internal Medicine | Admitting: Internal Medicine

## 2014-06-11 ENCOUNTER — Emergency Department (HOSPITAL_COMMUNITY): Payer: Medicare Other

## 2014-06-11 ENCOUNTER — Other Ambulatory Visit (HOSPITAL_COMMUNITY): Payer: Self-pay

## 2014-06-11 ENCOUNTER — Other Ambulatory Visit: Payer: Self-pay

## 2014-06-11 ENCOUNTER — Encounter (HOSPITAL_COMMUNITY): Payer: Self-pay | Admitting: *Deleted

## 2014-06-11 DIAGNOSIS — R471 Dysarthria and anarthria: Secondary | ICD-10-CM | POA: Diagnosis present

## 2014-06-11 DIAGNOSIS — B3789 Other sites of candidiasis: Secondary | ICD-10-CM | POA: Diagnosis present

## 2014-06-11 DIAGNOSIS — J90 Pleural effusion, not elsewhere classified: Secondary | ICD-10-CM | POA: Diagnosis present

## 2014-06-11 DIAGNOSIS — F05 Delirium due to known physiological condition: Secondary | ICD-10-CM

## 2014-06-11 DIAGNOSIS — D490 Neoplasm of unspecified behavior of digestive system: Secondary | ICD-10-CM | POA: Diagnosis present

## 2014-06-11 DIAGNOSIS — Z9849 Cataract extraction status, unspecified eye: Secondary | ICD-10-CM

## 2014-06-11 DIAGNOSIS — C7951 Secondary malignant neoplasm of bone: Secondary | ICD-10-CM | POA: Diagnosis present

## 2014-06-11 DIAGNOSIS — E785 Hyperlipidemia, unspecified: Secondary | ICD-10-CM | POA: Diagnosis present

## 2014-06-11 DIAGNOSIS — I639 Cerebral infarction, unspecified: Principal | ICD-10-CM | POA: Diagnosis present

## 2014-06-11 DIAGNOSIS — R531 Weakness: Secondary | ICD-10-CM

## 2014-06-11 DIAGNOSIS — C801 Malignant (primary) neoplasm, unspecified: Secondary | ICD-10-CM

## 2014-06-11 DIAGNOSIS — C3490 Malignant neoplasm of unspecified part of unspecified bronchus or lung: Secondary | ICD-10-CM | POA: Diagnosis present

## 2014-06-11 DIAGNOSIS — I10 Essential (primary) hypertension: Secondary | ICD-10-CM | POA: Diagnosis present

## 2014-06-11 DIAGNOSIS — G9340 Encephalopathy, unspecified: Secondary | ICD-10-CM | POA: Diagnosis present

## 2014-06-11 DIAGNOSIS — F17200 Nicotine dependence, unspecified, uncomplicated: Secondary | ICD-10-CM | POA: Diagnosis present

## 2014-06-11 DIAGNOSIS — J910 Malignant pleural effusion: Secondary | ICD-10-CM | POA: Diagnosis present

## 2014-06-11 DIAGNOSIS — Z7982 Long term (current) use of aspirin: Secondary | ICD-10-CM

## 2014-06-11 DIAGNOSIS — I739 Peripheral vascular disease, unspecified: Secondary | ICD-10-CM | POA: Diagnosis present

## 2014-06-11 DIAGNOSIS — K219 Gastro-esophageal reflux disease without esophagitis: Secondary | ICD-10-CM | POA: Diagnosis present

## 2014-06-11 DIAGNOSIS — C782 Secondary malignant neoplasm of pleura: Secondary | ICD-10-CM | POA: Diagnosis present

## 2014-06-11 DIAGNOSIS — Z720 Tobacco use: Secondary | ICD-10-CM | POA: Diagnosis present

## 2014-06-11 DIAGNOSIS — R222 Localized swelling, mass and lump, trunk: Secondary | ICD-10-CM | POA: Diagnosis present

## 2014-06-11 DIAGNOSIS — Z791 Long term (current) use of non-steroidal anti-inflammatories (NSAID): Secondary | ICD-10-CM

## 2014-06-11 DIAGNOSIS — I6523 Occlusion and stenosis of bilateral carotid arteries: Secondary | ICD-10-CM

## 2014-06-11 DIAGNOSIS — Z79899 Other long term (current) drug therapy: Secondary | ICD-10-CM

## 2014-06-11 DIAGNOSIS — C139 Malignant neoplasm of hypopharynx, unspecified: Secondary | ICD-10-CM | POA: Diagnosis present

## 2014-06-11 DIAGNOSIS — R410 Disorientation, unspecified: Secondary | ICD-10-CM | POA: Diagnosis present

## 2014-06-11 DIAGNOSIS — Z886 Allergy status to analgesic agent status: Secondary | ICD-10-CM

## 2014-06-11 DIAGNOSIS — F17210 Nicotine dependence, cigarettes, uncomplicated: Secondary | ICD-10-CM | POA: Diagnosis present

## 2014-06-11 DIAGNOSIS — I6529 Occlusion and stenosis of unspecified carotid artery: Secondary | ICD-10-CM | POA: Diagnosis present

## 2014-06-11 DIAGNOSIS — R4182 Altered mental status, unspecified: Secondary | ICD-10-CM

## 2014-06-11 DIAGNOSIS — B370 Candidal stomatitis: Secondary | ICD-10-CM | POA: Diagnosis present

## 2014-06-11 HISTORY — DX: Malignant neoplasm of unspecified part of unspecified bronchus or lung: C34.90

## 2014-06-11 LAB — CBC
HCT: 33.6 % — ABNORMAL LOW (ref 39.0–52.0)
Hemoglobin: 11.3 g/dL — ABNORMAL LOW (ref 13.0–17.0)
MCH: 30.2 pg (ref 26.0–34.0)
MCHC: 33.6 g/dL (ref 30.0–36.0)
MCV: 89.8 fL (ref 78.0–100.0)
Platelets: 585 10*3/uL — ABNORMAL HIGH (ref 150–400)
RBC: 3.74 MIL/uL — AB (ref 4.22–5.81)
RDW: 13.1 % (ref 11.5–15.5)
WBC: 17.6 10*3/uL — ABNORMAL HIGH (ref 4.0–10.5)

## 2014-06-11 LAB — PROTIME-INR
INR: 1.14 (ref 0.00–1.49)
PROTHROMBIN TIME: 14.7 s (ref 11.6–15.2)

## 2014-06-11 LAB — I-STAT CHEM 8, ED
BUN: 28 mg/dL — ABNORMAL HIGH (ref 6–20)
Calcium, Ion: 1.1 mmol/L — ABNORMAL LOW (ref 1.13–1.30)
Chloride: 106 mmol/L (ref 101–111)
Creatinine, Ser: 0.8 mg/dL (ref 0.61–1.24)
Glucose, Bld: 130 mg/dL — ABNORMAL HIGH (ref 70–99)
HEMATOCRIT: 36 % — AB (ref 39.0–52.0)
Hemoglobin: 12.2 g/dL — ABNORMAL LOW (ref 13.0–17.0)
Potassium: 3.7 mmol/L (ref 3.5–5.1)
SODIUM: 138 mmol/L (ref 135–145)
TCO2: 18 mmol/L (ref 0–100)

## 2014-06-11 LAB — CBG MONITORING, ED: Glucose-Capillary: 130 mg/dL — ABNORMAL HIGH (ref 70–99)

## 2014-06-11 LAB — APTT: APTT: 46 s — AB (ref 24–37)

## 2014-06-11 NOTE — ED Notes (Signed)
Pt. Was eating with a friend and friend noticed the slurred speech and facial drooping. Called EMS pt. Is hypertensive and tachycardic.

## 2014-06-11 NOTE — ED Provider Notes (Signed)
CSN: [id number]     Arrival date & time 06/11/14  2330 History  This chart was scribed for Julianne Rice, MD by Chester Holstein, ED Scribe. This patient was seen in room A12C/A12C and the patient's care was started at 11:43 PM.    Chief Complaint  Patient presents with  . Code Stroke   LEVEL 5 CAVEAT  The history is provided by the patient, medical records and the EMS personnel. The history is limited by the condition of the patient. No language interpreter was used.   HPI Comments: MYKEAL CARRICK is a 74 y.o. male brought in by ambulance, with PMHx of memory loss, spinal stenosis, leukocytosis, HTN, HLD,  who presents to the Emergency Department with AMS, slurred speech and facial droop. Per EMS, history is vague story, with situations changing. Pt states he began to feel unwell around 7-7:30. He states he sat and watched tv, waiting to feel better. However per pt's neighbor symptoms began around 7 PM.  Per neighbor, the pair were eating when neighbor noticed pt's speech changed. Pt was hypertensive and tachycardic upon EMS arrival. Pt is alert and oriented to himself. Pt was recently diagnosed with lung cancer. He was last seen in ED on 05/30/14 for SOB. Pt presented at that time with left pleural effusion with acute accumulation. Pt had pleurx drain placed on 05/31/14, scheduled to drain every other day. Pt denies LOC.  Past Medical History  Diagnosis Date  . Hypertension   . GERD (gastroesophageal reflux disease)   . Colon polyps   . Back pain, lumbosacral   . Sinusitis acute 03/24/2011  . Memory loss 04/16/2011  . Allergic state 05/30/2011  . Leukocytosis 12/15/2011  . Hyperlipidemia 02/15/2012  . Unspecified sinusitis (chronic) 04/05/2012  . Epicondylitis 10/10/2012  . Shortness of breath     WITH EXERTION AND BACK PAIN  . Wound drainage     L ELBOW  . Spinal stenosis, lumbar   . Nocturia   . Headache 05/05/2014   Past Surgical History  Procedure Laterality Date  . Lumbar  laminectomy  1974    twice  . Urethral stricture dilatation    . Colonoscopy    . Penile prosthesis implant    . Cataracts      REMOVED  . Decompressive lumbar laminectomy level 2 N/A 11/26/2012    Procedure: CENTRAL DECOMPRESSION LUMBAR LAMINECTOMY L3-L4,  L4-L5   ;  Surgeon: Tobi Bastos, MD;  Location: [facility];  Service: Orthopedics;  Laterality: N/A;  . Chest tube insertion Left 05/31/2014    Procedure: INSERTION OF LEFT PLEURAL DRAINAGE CATHETER;  Surgeon: Grace Isaac, MD;  Location: [facility];  Service: Thoracic;  Laterality: Left;  . Pleural effusion drainage Left 05/31/2014    Procedure: DRAINAGE OF LEFT PLEURAL EFFUSION;  Surgeon: Grace Isaac, MD;  Location: [facility];  Service: Thoracic;  Laterality: Left;   History reviewed. No pertinent family history. History  Substance Use Topics  . Smoking status: Current Every Day Smoker -- 0.25 packs/day for 63 years    Types: Cigarettes  . Smokeless tobacco: Former Systems developer    Types: Chew     Comment: 3-5 cigs daily  . Alcohol Use: 0.0 oz/week    0 Standard drinks or equivalent per week     Comment: once in a while     Review of Systems  Unable to perform ROS: Mental status change  Gastrointestinal: Positive for abdominal pain.  Neurological: Positive for speech difficulty. Negative for syncope.  Psychiatric/Behavioral: Positive for confusion.      Allergies  Ibuprofen; Gabapentin; and Valacyclovir  Home Medications   Prior to Admission medications   Medication Sig Start Date End Date Taking? Authorizing Provider  acetaminophen (TYLENOL) 325 MG tablet Take 650 mg by mouth every morning.   Yes Historical Provider, MD  amLODipine (NORVASC) 5 MG tablet Take 1 tablet (5 mg total) by mouth daily. for high blood pressure 05/31/14  Yes Barton Dubois, MD  aspirin 81 MG tablet Take 1 tablet (81 mg total) by mouth daily. Hold until 06/01/14; due to pleurex catheter placement; then resume therapy as previously indictaed 05/31/14  Yes  Barton Dubois, MD  naproxen sodium (ALEVE) 220 MG tablet Take 2 tablets (440 mg total) by mouth every morning. Hold until 06/01/14; due to pleurex catheter placement; then resume therapy as previously indictaed 05/31/14  Yes Barton Dubois, MD  ranitidine (ZANTAC) 300 MG tablet take 1 tablet by mouth at bedtime 03/15/14  Yes Mosie Lukes, MD  nicotine (NICODERM CQ) 14 mg/24hr patch Place 1 patch (14 mg total) onto the skin daily. Patient not taking: Reported on 06/01/2014 05/31/14   Barton Dubois, MD  sodium polystyrene (KAYEXALATE) 15 GM/60ML suspension Take 60 mLs (15 g total) by mouth once. Patient not taking: Reported on 06/09/2014 06/08/14   Mosie Lukes, MD  traMADol (ULTRAM) 50 MG tablet Take 1 tablet (50 mg total) by mouth every 8 (eight) hours as needed for severe pain. Patient not taking: Reported on 06/12/2014 05/31/14   Barton Dubois, MD   BP 113/50 mmHg  Pulse 98  Temp(Src) 97.8 F (36.6 C) (Oral)  Resp 17  Ht '5\' 9"'$  (1.753 m)  Wt 178 lb 12.7 oz (81.1 kg)  BMI 26.39 kg/m2  SpO2 97% Physical Exam  Constitutional: He appears well-developed and well-nourished. No distress.  HENT:  Head: Normocephalic and atraumatic.  Mouth/Throat: Oropharynx is clear and moist.  Eyes: EOM are normal. Pupils are equal, round, and reactive to light.  Neck: Normal range of motion. Neck supple.  Cardiovascular: Normal rate and regular rhythm.   Pulmonary/Chest: Effort normal and breath sounds normal. No respiratory distress. He has no wheezes. He has no rales.  Decreased breath sounds left lung field.  Abdominal: Soft. Bowel sounds are normal. He exhibits no distension. There is no tenderness. There is no rebound and no guarding.  Central pulsatile abdominal mass  Musculoskeletal: Normal range of motion. He exhibits no edema or tenderness.  No calf swelling or tenderness.  Neurological: He is alert.  Oriented to person. Mild slurred speech and dysarthria. Decrease sensation to painful stimuli on the  left. Mild right lower facial droop. 5/5 motor in all extremities.  Skin: Skin is warm and dry. No rash noted. No erythema.  Psychiatric: He has a normal mood and affect. His behavior is normal.  Nursing note and vitals reviewed.   ED Course  Procedures (including critical care time) DIAGNOSTIC STUDIES: Oxygen Saturation is 99% on room air, normal by my interpretation.    COORDINATION OF CARE: 11:49 PM Discussed treatment plan with patient at beside, the patient agrees with the plan and has no further questions at this time.   Labs Review Labs Reviewed  APTT - Abnormal; Notable for the following:    aPTT 46 (*)    All other components within normal limits  CBC - Abnormal; Notable for the following:    WBC 17.6 (*)    RBC 3.74 (*)    Hemoglobin 11.3 (*)  HCT 33.6 (*)    Platelets 585 (*)    All other components within normal limits  DIFFERENTIAL - Abnormal; Notable for the following:    Neutro Abs 13.5 (*)    Monocytes Absolute 1.8 (*)    All other components within normal limits  COMPREHENSIVE METABOLIC PANEL - Abnormal; Notable for the following:    CO2 19 (*)    Glucose, Bld 128 (*)    BUN 24 (*)    Calcium 8.7 (*)    Albumin 2.9 (*)    All other components within normal limits  LIPID PANEL - Abnormal; Notable for the following:    HDL 36 (*)    LDL Cholesterol 109 (*)    All other components within normal limits  CBC WITH DIFFERENTIAL/PLATELET - Abnormal; Notable for the following:    WBC 14.0 (*)    RBC 3.47 (*)    Hemoglobin 10.3 (*)    HCT 30.6 (*)    Platelets 564 (*)    Neutro Abs 10.5 (*)    Monocytes Absolute 1.7 (*)    All other components within normal limits  COMPREHENSIVE METABOLIC PANEL - Abnormal; Notable for the following:    CO2 21 (*)    Glucose, Bld 122 (*)    Calcium 8.7 (*)    Albumin 2.7 (*)    All other components within normal limits  I-STAT CHEM 8, ED - Abnormal; Notable for the following:    BUN 28 (*)    Glucose, Bld 130 (*)     Calcium, Ion 1.10 (*)    Hemoglobin 12.2 (*)    HCT 36.0 (*)    All other components within normal limits  CBG MONITORING, ED - Abnormal; Notable for the following:    Glucose-Capillary 130 (*)    All other components within normal limits  MRSA PCR SCREENING  PROTIME-INR  URINE RAPID DRUG SCREEN (HOSP PERFORMED)  ETHANOL  PROTIME-INR  LACTIC ACID, PLASMA  HEMOGLOBIN A1C  I-STAT TROPOININ, ED    Imaging Review Ct Head (brain) Wo Contrast  06/11/2014   CLINICAL DATA:  Right-sided weakness, acute onset  EXAM: CT HEAD WITHOUT CONTRAST  TECHNIQUE: Contiguous axial images were obtained from the base of the skull through the vertex without intravenous contrast.  COMPARISON:  PET-CT scan 428 1,016, head CT 05/04/2014  FINDINGS: No acute intracranial hemorrhage. No focal mass lesion. No CT evidence of acute infarction. No midline shift or mass effect. No hydrocephalus. Basilar cisterns are patent. Mild subcortical white matter hypodensities. Paranasal sinuses and mastoid air cells are clear.  IMPRESSION: 1. No CT evidence of acute infarction. 2. No intracranial hemorrhage. 3. Scattered subcortical white matter hypodensities consistent with microvascular disease. Findings conveyed toKirkpatrick, MDon 06/11/2014  at23:54.   Electronically Signed   By: Suzy Bouchard M.D.   On: 06/11/2014 23:55   Dg Chest Port 1 View  06/12/2014   CLINICAL DATA:  Altered mental status  EXAM: PORTABLE CHEST - 1 VIEW  COMPARISON:  06/08/2014, 05/31/2014  FINDINGS: The left pleural effusion and loculated pleural collections persist, probably unchanged from the PET examination of 06/08/2014. The right lung is clear. Pulmonary vasculature is normal. There is mild unchanged aortic tortuosity. Heart size is upper normal, unchanged.  IMPRESSION: Left effusion and loculated pleural collections, as observed on the recent PET examination. Right lung is clear.   Electronically Signed   By: Andreas Newport M.D.   On: 06/12/2014 00:17      EKG Interpretation None  11:47 PM Dr. Leonel Ramsay at bedside.    1:02 AM per nurse, pt is increasingly lethargic, drooling on himself. Increased slurred speech noted.  12:40 AM Plan to admit discussed with hospitalist.  1:16 AM Consult with neurology. Patient case explained and discussed. Call ended at 1:17 AM 1:17 AM Dr. Posey Pronto in room with pt.   MDM   Final diagnoses:  Altered mental status  CVA (cerebral vascular accident)    I personally performed the services described in this documentation, which was scribed in my presence. The recorded information has been reviewed and is accurate.  Per Dr. Leonel Ramsay, patient's not a TPA candidate. We'll admit to medicine for further workup. Initially tachycardic and hypertensive which is improved without therapeutic intervention.    Julianne Rice, MD 06/12/14 [phone]

## 2014-06-12 ENCOUNTER — Inpatient Hospital Stay (HOSPITAL_COMMUNITY): Payer: Medicare Other

## 2014-06-12 ENCOUNTER — Encounter (HOSPITAL_COMMUNITY): Payer: Self-pay | Admitting: Radiology

## 2014-06-12 ENCOUNTER — Emergency Department (HOSPITAL_COMMUNITY): Payer: Medicare Other

## 2014-06-12 DIAGNOSIS — I639 Cerebral infarction, unspecified: Secondary | ICD-10-CM | POA: Diagnosis present

## 2014-06-12 DIAGNOSIS — Z9849 Cataract extraction status, unspecified eye: Secondary | ICD-10-CM | POA: Diagnosis not present

## 2014-06-12 DIAGNOSIS — K219 Gastro-esophageal reflux disease without esophagitis: Secondary | ICD-10-CM | POA: Diagnosis not present

## 2014-06-12 DIAGNOSIS — Z791 Long term (current) use of non-steroidal anti-inflammatories (NSAID): Secondary | ICD-10-CM | POA: Diagnosis not present

## 2014-06-12 DIAGNOSIS — C3490 Malignant neoplasm of unspecified part of unspecified bronchus or lung: Secondary | ICD-10-CM | POA: Diagnosis present

## 2014-06-12 DIAGNOSIS — I10 Essential (primary) hypertension: Secondary | ICD-10-CM | POA: Diagnosis not present

## 2014-06-12 DIAGNOSIS — C7951 Secondary malignant neoplasm of bone: Secondary | ICD-10-CM | POA: Diagnosis present

## 2014-06-12 DIAGNOSIS — R4182 Altered mental status, unspecified: Secondary | ICD-10-CM | POA: Diagnosis present

## 2014-06-12 DIAGNOSIS — E785 Hyperlipidemia, unspecified: Secondary | ICD-10-CM | POA: Diagnosis not present

## 2014-06-12 DIAGNOSIS — D379 Neoplasm of uncertain behavior of digestive organ, unspecified: Secondary | ICD-10-CM | POA: Diagnosis not present

## 2014-06-12 DIAGNOSIS — G4730 Sleep apnea, unspecified: Secondary | ICD-10-CM

## 2014-06-12 DIAGNOSIS — I739 Peripheral vascular disease, unspecified: Secondary | ICD-10-CM | POA: Diagnosis present

## 2014-06-12 DIAGNOSIS — J910 Malignant pleural effusion: Secondary | ICD-10-CM | POA: Diagnosis present

## 2014-06-12 DIAGNOSIS — C782 Secondary malignant neoplasm of pleura: Secondary | ICD-10-CM | POA: Diagnosis present

## 2014-06-12 DIAGNOSIS — R410 Disorientation, unspecified: Secondary | ICD-10-CM | POA: Diagnosis not present

## 2014-06-12 DIAGNOSIS — D490 Neoplasm of unspecified behavior of digestive system: Secondary | ICD-10-CM | POA: Diagnosis present

## 2014-06-12 DIAGNOSIS — I6521 Occlusion and stenosis of right carotid artery: Secondary | ICD-10-CM | POA: Diagnosis not present

## 2014-06-12 DIAGNOSIS — I6523 Occlusion and stenosis of bilateral carotid arteries: Secondary | ICD-10-CM | POA: Diagnosis not present

## 2014-06-12 DIAGNOSIS — B3789 Other sites of candidiasis: Secondary | ICD-10-CM | POA: Diagnosis present

## 2014-06-12 DIAGNOSIS — F05 Delirium due to known physiological condition: Secondary | ICD-10-CM | POA: Diagnosis not present

## 2014-06-12 DIAGNOSIS — I6522 Occlusion and stenosis of left carotid artery: Secondary | ICD-10-CM | POA: Diagnosis not present

## 2014-06-12 DIAGNOSIS — F17210 Nicotine dependence, cigarettes, uncomplicated: Secondary | ICD-10-CM | POA: Diagnosis present

## 2014-06-12 DIAGNOSIS — Z79899 Other long term (current) drug therapy: Secondary | ICD-10-CM | POA: Diagnosis not present

## 2014-06-12 DIAGNOSIS — I6529 Occlusion and stenosis of unspecified carotid artery: Secondary | ICD-10-CM | POA: Diagnosis not present

## 2014-06-12 DIAGNOSIS — R471 Dysarthria and anarthria: Secondary | ICD-10-CM | POA: Diagnosis present

## 2014-06-12 DIAGNOSIS — Z7982 Long term (current) use of aspirin: Secondary | ICD-10-CM | POA: Diagnosis not present

## 2014-06-12 DIAGNOSIS — C139 Malignant neoplasm of hypopharynx, unspecified: Secondary | ICD-10-CM | POA: Diagnosis not present

## 2014-06-12 DIAGNOSIS — Z886 Allergy status to analgesic agent status: Secondary | ICD-10-CM | POA: Diagnosis not present

## 2014-06-12 LAB — LIPID PANEL
CHOL/HDL RATIO: 4.4 ratio
Cholesterol: 158 mg/dL (ref 0–200)
HDL: 36 mg/dL — AB (ref >40)
LDL Cholesterol: 109 mg/dL — ABNORMAL HIGH (ref 0–99)
Triglycerides: 67 mg/dL (ref <150)
VLDL: 13 mg/dL (ref 0–40)

## 2014-06-12 LAB — COMPREHENSIVE METABOLIC PANEL
ALBUMIN: 2.7 g/dL — AB (ref 3.5–5.0)
ALT: 22 U/L (ref 17–63)
ALT: 25 U/L (ref 17–63)
AST: 17 U/L (ref 15–41)
AST: 21 U/L (ref 15–41)
Albumin: 2.9 g/dL — ABNORMAL LOW (ref 3.5–5.0)
Alkaline Phosphatase: 73 U/L (ref 38–126)
Alkaline Phosphatase: 77 U/L (ref 38–126)
Anion gap: 11 (ref 5–15)
Anion gap: 14 (ref 5–15)
BILIRUBIN TOTAL: 0.4 mg/dL (ref 0.3–1.2)
BILIRUBIN TOTAL: 0.5 mg/dL (ref 0.3–1.2)
BUN: 19 mg/dL (ref 6–20)
BUN: 24 mg/dL — ABNORMAL HIGH (ref 6–20)
CALCIUM: 8.7 mg/dL — AB (ref 8.9–10.3)
CO2: 19 mmol/L — ABNORMAL LOW (ref 22–32)
CO2: 21 mmol/L — AB (ref 22–32)
CREATININE: 1.09 mg/dL (ref 0.61–1.24)
Calcium: 8.7 mg/dL — ABNORMAL LOW (ref 8.9–10.3)
Chloride: 103 mmol/L (ref 101–111)
Chloride: 105 mmol/L (ref 101–111)
Creatinine, Ser: 0.77 mg/dL (ref 0.61–1.24)
GFR calc Af Amer: >60 mL/min (ref >60)
GLUCOSE: 128 mg/dL — AB (ref 70–99)
Glucose, Bld: 122 mg/dL — ABNORMAL HIGH (ref 70–99)
POTASSIUM: 3.5 mmol/L (ref 3.5–5.1)
Potassium: 3.5 mmol/L (ref 3.5–5.1)
SODIUM: 136 mmol/L (ref 135–145)
Sodium: 137 mmol/L (ref 135–145)
TOTAL PROTEIN: 6.9 g/dL (ref 6.5–8.1)
Total Protein: 6.8 g/dL (ref 6.5–8.1)

## 2014-06-12 LAB — CBC WITH DIFFERENTIAL/PLATELET
BASOS ABS: 0 10*3/uL (ref 0.0–0.1)
Basophils Relative: 0 % (ref 0–1)
EOS PCT: 1 % (ref 0–5)
Eosinophils Absolute: 0.1 10*3/uL (ref 0.0–0.7)
HEMATOCRIT: 30.6 % — AB (ref 39.0–52.0)
Hemoglobin: 10.3 g/dL — ABNORMAL LOW (ref 13.0–17.0)
Lymphocytes Relative: 12 % (ref 12–46)
Lymphs Abs: 1.6 10*3/uL (ref 0.7–4.0)
MCH: 29.7 pg (ref 26.0–34.0)
MCHC: 33.7 g/dL (ref 30.0–36.0)
MCV: 88.2 fL (ref 78.0–100.0)
Monocytes Absolute: 1.7 10*3/uL — ABNORMAL HIGH (ref 0.1–1.0)
Monocytes Relative: 12 % (ref 3–12)
Neutro Abs: 10.5 10*3/uL — ABNORMAL HIGH (ref 1.7–7.7)
Neutrophils Relative %: 75 % (ref 43–77)
Platelets: 564 10*3/uL — ABNORMAL HIGH (ref 150–400)
RBC: 3.47 MIL/uL — ABNORMAL LOW (ref 4.22–5.81)
RDW: 13 % (ref 11.5–15.5)
WBC: 14 10*3/uL — AB (ref 4.0–10.5)

## 2014-06-12 LAB — MRSA PCR SCREENING: MRSA BY PCR: NEGATIVE

## 2014-06-12 LAB — DIFFERENTIAL
BASOS PCT: 0 % (ref 0–1)
Basophils Absolute: 0 10*3/uL (ref 0.0–0.1)
Eosinophils Absolute: 0.2 10*3/uL (ref 0.0–0.7)
Eosinophils Relative: 1 % (ref 0–5)
LYMPHS PCT: 12 % (ref 12–46)
Lymphs Abs: 2.1 10*3/uL (ref 0.7–4.0)
Monocytes Absolute: 1.8 10*3/uL — ABNORMAL HIGH (ref 0.1–1.0)
Monocytes Relative: 10 % (ref 3–12)
NEUTROS ABS: 13.5 10*3/uL — AB (ref 1.7–7.7)
Neutrophils Relative %: 77 % (ref 43–77)

## 2014-06-12 LAB — GLUCOSE, CAPILLARY: GLUCOSE-CAPILLARY: 106 mg/dL — AB (ref 70–99)

## 2014-06-12 LAB — I-STAT TROPONIN, ED: TROPONIN I, POC: 0.04 ng/mL (ref 0.00–0.08)

## 2014-06-12 LAB — RAPID URINE DRUG SCREEN, HOSP PERFORMED
AMPHETAMINES: NOT DETECTED
BENZODIAZEPINES: NOT DETECTED
Barbiturates: NOT DETECTED
COCAINE: NOT DETECTED
Opiates: NOT DETECTED
TETRAHYDROCANNABINOL: NOT DETECTED

## 2014-06-12 LAB — PROTIME-INR
INR: 1.16 (ref 0.00–1.49)
PROTHROMBIN TIME: 15 s (ref 11.6–15.2)

## 2014-06-12 LAB — ETHANOL

## 2014-06-12 LAB — PATHOLOGIST SMEAR REVIEW

## 2014-06-12 LAB — LACTIC ACID, PLASMA: LACTIC ACID, VENOUS: 0.9 mmol/L (ref 0.5–2.0)

## 2014-06-12 MED ORDER — ASPIRIN 325 MG PO TABS
325.0000 mg | ORAL_TABLET | Freq: Every day | ORAL | Status: DC
  Administered 2014-06-13: 325 mg via ORAL
  Filled 2014-06-12 (×2): qty 1

## 2014-06-12 MED ORDER — FAMOTIDINE 20 MG PO TABS
20.0000 mg | ORAL_TABLET | Freq: Every day | ORAL | Status: DC
  Administered 2014-06-13 – 2014-06-17 (×5): 20 mg via ORAL
  Filled 2014-06-12 (×5): qty 1

## 2014-06-12 MED ORDER — ONDANSETRON HCL 4 MG/2ML IJ SOLN: 4.0000 mg | Freq: Four times a day (QID) | INTRAMUSCULAR | Status: DC | PRN

## 2014-06-12 MED ORDER — IOHEXOL 350 MG/ML SOLN
100.0000 mL | Freq: Once | INTRAVENOUS | Status: AC | PRN
  Administered 2014-06-12: 100 mL via INTRAVENOUS

## 2014-06-12 MED ORDER — FAMOTIDINE IN NACL 20-0.9 MG/50ML-% IV SOLN
20.0000 mg | Freq: Two times a day (BID) | INTRAVENOUS | Status: DC
  Administered 2014-06-12 (×2): 20 mg via INTRAVENOUS
  Filled 2014-06-12 (×3): qty 50

## 2014-06-12 MED ORDER — ASPIRIN 300 MG RE SUPP
300.0000 mg | Freq: Every day | RECTAL | Status: DC
  Administered 2014-06-12: 300 mg via RECTAL
  Filled 2014-06-12: qty 1

## 2014-06-12 MED ORDER — ACETAMINOPHEN 500 MG PO TABS
500.0000 mg | ORAL_TABLET | Freq: Four times a day (QID) | ORAL | Status: DC | PRN
  Administered 2014-06-16: 500 mg via ORAL
  Filled 2014-06-12: qty 1

## 2014-06-12 MED ORDER — OXYCODONE HCL 5 MG PO TABS
5.0000 mg | ORAL_TABLET | ORAL | Status: DC | PRN
  Administered 2014-06-16 (×3): 5 mg via ORAL
  Filled 2014-06-12 (×3): qty 1

## 2014-06-12 MED ORDER — NICOTINE 14 MG/24HR TD PT24
14.0000 mg | MEDICATED_PATCH | Freq: Every day | TRANSDERMAL | Status: DC
  Administered 2014-06-12 – 2014-06-17 (×6): 14 mg via TRANSDERMAL
  Filled 2014-06-12 (×6): qty 1

## 2014-06-12 MED ORDER — STROKE: EARLY STAGES OF RECOVERY BOOK
Freq: Once | Status: AC
  Administered 2014-06-12: 03:00:00
  Filled 2014-06-12: qty 1

## 2014-06-12 NOTE — Progress Notes (Signed)
STROKE TEAM PROGRESS NOTE   HISTORY Ian Gonzales is a 74 y.o. male with a history of recently diagnosed suspected lung cancer awaiting formal tissue diagnosis who presents with speech difficulty since about 7pm tonight 06/11/2014. He was at home and felt something was wrong about 7 pm. He had family come by later and they noticed that he was having difficulty with his speech and called 911. He presented to the ED at 2330. Patient was not administered TPA secondary to delay in arrival. He was admitted for further evaluation and treatment.   SUBJECTIVE (INTERVAL HISTORY) His vascular tech is at the bedside, no friends/family.  Overall he feels his condition is unchanged.    OBJECTIVE Temp:  [97.4 F (36.3 C)-97.8 F (36.6 C)] 97.4 F (36.3 C) (05/02 0800) Pulse Rate:  [70-113] 70 (05/02 0800) Cardiac Rhythm:  [-] Sinus tachycardia;Normal sinus rhythm (05/02 0430) Resp:  [15-23] 15 (05/02 0800) BP: (106-153)/(41-77) 120/41 mmHg (05/02 0800) SpO2:  [94 %-99 %] 97 % (05/02 0800) FiO2 (%):  [32 %] 32 % (05/02 0140) Weight:  [81.1 kg (178 lb 12.7 oz)-81.194 kg (179 lb)] 81.1 kg (178 lb 12.7 oz) (05/02 0230)   Recent Labs Lab 06/08/14 1236 06/11/14 2351  GLUCAP 109* 130*    Recent Labs Lab 06/07/14 1613 06/09/14 1347 06/11/14 2333 06/11/14 2338 06/12/14 0530  NA 133* 134* 136 138 137  K 6.1* 4.4 3.5 3.7 3.5  CL 101 101 103 106 105  CO2 21 24 19*  --  21*  GLUCOSE 60* 103* 128* 130* 122*  BUN 17 21 24* 28* 19  CREATININE 0.81 1.22 1.09 0.80 0.77  CALCIUM 9.2 9.5 8.7*  --  8.7*    Recent Labs Lab 06/11/14 2333 06/12/14 0530  AST 21 17  ALT 25 22  ALKPHOS 77 73  BILITOT 0.4 0.5  PROT 6.9 6.8  ALBUMIN 2.9* 2.7*    Recent Labs Lab 06/07/14 1613 06/11/14 2333 06/11/14 2338 06/12/14 0530  WBC 15.4* 17.6*  --  14.0*  NEUTROABS 13.0* 13.5*  --  10.5*  HGB 11.7* 11.3* 12.2* 10.3*  HCT 35.3* 33.6* 36.0* 30.6*  MCV 92.3 89.8  --  88.2  PLT 558.0* 585*  --  564*    No results for input(s): CKTOTAL, CKMB, CKMBINDEX, TROPONINI in the last 168 hours.  Recent Labs  06/11/14 2333 06/12/14 0530  LABPROT 14.7 15.0  INR 1.14 1.16   No results for input(s): COLORURINE, LABSPEC, PHURINE, GLUCOSEU, HGBUR, BILIRUBINUR, KETONESUR, PROTEINUR, UROBILINOGEN, NITRITE, LEUKOCYTESUR in the last 72 hours.  Invalid input(s): APPERANCEUR     Component Value Date/Time   CHOL 158 06/12/2014 0331   TRIG 67 06/12/2014 0331   TRIG 70 02/18/2006 0944   HDL 36* 06/12/2014 0331   CHOLHDL 4.4 06/12/2014 0331   CHOLHDL 5.3 CALC 02/18/2006 0944   VLDL 13 06/12/2014 0331   LDLCALC 109* 06/12/2014 0331   Lab Results  Component Value Date   HGBA1C 6.4* 05/31/2014      Component Value Date/Time   LABOPIA NONE DETECTED 06/12/2014 0208   COCAINSCRNUR NONE DETECTED 06/12/2014 0208   LABBENZ NONE DETECTED 06/12/2014 0208   AMPHETMU NONE DETECTED 06/12/2014 0208   THCU NONE DETECTED 06/12/2014 0208   LABBARB NONE DETECTED 06/12/2014 0208     Recent Labs Lab 06/11/14 2334  ETH <5    Ct Head (brain) Wo Contrast  06/11/2014   CLINICAL DATA:  Right-sided weakness, acute onset  EXAM: CT HEAD WITHOUT CONTRAST  TECHNIQUE: Contiguous  axial images were obtained from the base of the skull through the vertex without intravenous contrast.  COMPARISON:  PET-CT scan 428 1,016, head CT 05/04/2014  FINDINGS: No acute intracranial hemorrhage. No focal mass lesion. No CT evidence of acute infarction. No midline shift or mass effect. No hydrocephalus. Basilar cisterns are patent. Mild subcortical white matter hypodensities. Paranasal sinuses and mastoid air cells are clear.  IMPRESSION: 1. No CT evidence of acute infarction. 2. No intracranial hemorrhage. 3. Scattered subcortical white matter hypodensities consistent with microvascular disease. Findings conveyed toKirkpatrick, MDon 06/11/2014  at23:54.   Electronically Signed   By: Suzy Bouchard M.D.   On: 06/11/2014 23:55   Dg Chest  Port 1 View  06/12/2014   CLINICAL DATA:  Altered mental status  EXAM: PORTABLE CHEST - 1 VIEW  COMPARISON:  06/08/2014, 05/31/2014  FINDINGS: The left pleural effusion and loculated pleural collections persist, probably unchanged from the PET examination of 06/08/2014. The right lung is clear. Pulmonary vasculature is normal. There is mild unchanged aortic tortuosity. Heart size is upper normal, unchanged.  IMPRESSION: Left effusion and loculated pleural collections, as observed on the recent PET examination. Right lung is clear.   Electronically Signed   By: Andreas Newport M.D.   On: 06/12/2014 00:17     PHYSICAL EXAM Elderly Caucasian male currently not in distress. . Afebrile. Head is nontraumatic. Neck is supple with soft bilateral carotid  bruits.    Cardiac exam no murmur or gallop. Lungs are clear to auscultation. Distal pulses are well felt. Neurological Exam : Awake alert mild expressive language difficulties with dysarthria. Good comprehension and understanding. Follows commands well. Extraocular movements are full range but decreased blink to threat on the right compared to the left. Pupils equal reactive. Fundi were not visualized. Vision acuity seems adequate. Mild right lower facial weakness. Tongue midline. Motor system exam mild right upper and lower extremity drift. Weakness of the right grip and intrinsic hand muscles as well as right hip flexors and ankle dorsiflexors 4/5. Diminished right hemibody sensation. Coordination slow but accurate. Plantars are upgoing. Deep tendon reflexes are 2+ symmetric. Gait was not tested. ASSESSMENT/PLAN [name] is a 74 y.o. 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 He did not receive IV t-PA due to delay in arrival.   Stroke:  Dominant left brain infarct, with bilateral  extracranial carotid stenosis. Workup underway  Resultant  Dysarthria, right hemiparesis, right hemisensory loss  MRI  With and without contrast pending   Carotid Doppler  R ICA > 80%, L ICA w/ "thumped" waveform c/w distal obstruction  2D Echo  pending   HgbA1c 6.4  SCDs for VTE prophylaxis Diet NPO time specified  aspirin 81 mg orally every day prior to admission, now on aspirin 325 mg orally every day  Ongoing aggressive stroke risk factor management  Therapy recommendations:  pending   Disposition:  pending   ICA stenosis  Has bilateral carotid bruits  Carotid Doppler  R ICA > 80%, L ICA w/ "thumped" waveform c/w distal obstruction  CTA head and neck  Hypertension  Home meds:   norvasc  Stable  Hyperlipidemia  Home meds:  No statin   LDL 109, goal < 70  Add statin  Continue statin at discharge  Other Stroke Risk Factors  Advanced age  Cigarette smoker, advised to stop smoking  ETOH use  Other Active Problems  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 I have made any additions or clarifications directly to the above note. Agree with note above. He he presented with right-sided weakness and speech difficulties MRI is pending but exams suggest left MCA branch infarct area did carotid ultrasounds suggest bilateral significant carotid stenosis. He is at risk for recurrent strokes, TIAs, neurological worsening and needs ongoing stroke evaluation and aggressive risk factor modification. He is likely to need elective carotid revascularization.  Antony Contras, MD Medical Director [facility] Pager: [phone] 06/12/2014 1:50 PM    To contact Stroke Continuity  provider, please refer to [email or web address] After hours, contact General Neurology

## 2014-06-12 NOTE — Progress Notes (Signed)
*  PRELIMINARY RESULTS* Vascular Ultrasound Carotid Duplex (Doppler) has been completed.   Findings suggest >80% right internal carotid artery stenosis. The left internal carotid artery exhibits an atypical thumped waveform, suggestive of possible distal obstruction. The left external carotid artery exhibits elevated velocities suggestive of stenosis. Vertebral arteries are patent with antegrade flow bilaterally.   06/12/2014 10:29 AM Maudry Mayhew, RVT, RDCS, RDMS

## 2014-06-12 NOTE — Code Documentation (Signed)
Code stroke called at 2326 hrs on May 1,2016 for this 74 y/o white male pt LSN at 1900 hrs while eating and visiting with a friend.  At 2300 hrs his family arrived and noted his speech to be garbled and slurred, with a right facial  droop present.  EMS was called by his family.  CBG 141  SBP 180 with tachycardia 126.  Pt arrived at [facility] ED at 2330, was cleared for CT at 2332 by Dr Lita Mains and arrived at [place] at 2335.  CT head negative for acute pathology.  Results conveyed to Dr Leonel Ramsay at [phone].  Pt was returned to [facility]  where his initial NIHSS was scored as a 5 with points given for  wrong month, facial palsy, partial sensory loss, aphasia and dysarthria.   Pt was outside the window for TPA..  Will be admitted for stroke workup.

## 2014-06-12 NOTE — Evaluation (Signed)
Clinical/Bedside Swallow Evaluation Patient Details  Name: Ian Gonzales MRN: [id number] Date of Birth: [birth year]  Today's Date: 06/12/2014 Time: SLP Start Time (ACUTE ONLY): 1340 SLP Stop Time (ACUTE ONLY): 1400 SLP Time Calculation (min) (ACUTE ONLY): 20 min  Past Medical History:  Past Medical History  Diagnosis Date  . Hypertension   . GERD (gastroesophageal reflux disease)   . Colon polyps   . Back pain, lumbosacral   . Sinusitis acute 03/24/2011  . Memory loss 04/16/2011  . Allergic state 05/30/2011  . Leukocytosis 12/15/2011  . Hyperlipidemia 02/15/2012  . Unspecified sinusitis (chronic) 04/05/2012  . Epicondylitis 10/10/2012  . Shortness of breath     WITH EXERTION AND BACK PAIN  . Wound drainage     L ELBOW  . Spinal stenosis, lumbar   . Nocturia   . Headache 05/05/2014   Past Surgical History:  Past Surgical History  Procedure Laterality Date  . Lumbar laminectomy  1974    twice  . Urethral stricture dilatation    . Colonoscopy    . Penile prosthesis implant    . Cataracts      REMOVED  . Decompressive lumbar laminectomy level 2 N/A 11/26/2012    Procedure: CENTRAL DECOMPRESSION LUMBAR LAMINECTOMY L3-L4,  L4-L5   ;  Surgeon: Tobi Bastos, MD;  Location: [facility];  Service: Orthopedics;  Laterality: N/A;  . Chest tube insertion Left 05/31/2014    Procedure: INSERTION OF LEFT PLEURAL DRAINAGE CATHETER;  Surgeon: Grace Isaac, MD;  Location: [place];  Service: Thoracic;  Laterality: Left;  . Pleural effusion drainage Left 05/31/2014    Procedure: DRAINAGE OF LEFT PLEURAL EFFUSION;  Surgeon: Grace Isaac, MD;  Location: [place];  Service: Thoracic;  Laterality: Left;   HPI:  [name] is a 74 y.o. 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 He did not receive IV t-PA due to delay in arrival. Pt  with left CVA, MRI pending.    Assessment / Plan / Recommendation Clinical Impression  Pt demonstrates clinical indication of oral dysphagia due to right sided neuromuscular weakness following CVA. The pt immediately senses probable aspiration with moderate to large sips with hard cough. Pt independently corrected his intake by taking very small sips with 100% accuracy over 4 oz of water with min verbal cues. Pt also able to masticate solids, though he takes small bites and is likely to have right oral residuals with meal. Recommend pt initiate a dys 2 (finely chopped ) diet with thin liquids, pills whole in puree. Pt is able to follow recommendations with intermittent supervision, but may need thickened liquids if frequent coughing observed. SLP will f/u tomorrow for check of diet tolerance and evaluations of speech and language.     Aspiration Risk  Moderate    Diet Recommendation Dysphagia 2 (Fine chop);Thin   Medication Administration: Whole meds with puree Compensations: Small sips/bites;Slow rate;Check for anterior loss;Check for pocketing    Other  Recommendations     Follow Up Recommendations       Frequency and Duration    2 weeks   Pertinent Vitals/Pain NA    SLP Swallow Goals     Swallow Study Prior Functional Status       General Other Pertinent Information: [name] is a 74 y.o. male with history of hypertension, hyperlipidemia, GERD, recent diagnosis of malignant pleuroal effusion  with possible lung malignancy, Pleurx catheter, cigarette smoke presenting with slurred speech. He did not receive IV t-PA due to delay in arrival. Pt with left CVA, MRI pending.  Type of Study: Bedside swallow evaluation Previous Swallow Assessment: none Diet Prior to this Study: NPO Temperature Spikes Noted: No Respiratory Status: Room air History of Recent Intubation: No Behavior/Cognition: Alert;Cooperative;Pleasant mood Oral Cavity - Dentition: Other (Comment)  (dentures) Self-Feeding Abilities: Able to feed self Patient Positioning: Upright in bed Baseline Vocal Quality: Normal Volitional Cough: Strong Volitional Swallow: Able to elicit    Oral/Motor/Sensory Function Overall Oral Motor/Sensory Function: Impaired Labial ROM: Reduced right Labial Symmetry: Abnormal symmetry right Labial Strength: Reduced Labial Sensation: Reduced Lingual ROM: Reduced right Lingual Symmetry: Abnormal symmetry right Lingual Strength: Reduced Lingual Sensation: Reduced Facial ROM: Reduced right Facial Symmetry: Right drooping eyelid;Right droop Facial Strength: Reduced Facial Sensation: Reduced Velum: Within Functional Limits Mandible: Within Functional Limits   Ice Chips Ice chips: Not tested   Thin Liquid Thin Liquid: Impaired Presentation: Cup;Straw;Self Fed Oral Phase Impairments: Reduced labial seal Oral Phase Functional Implications: Right anterior spillage Pharyngeal  Phase Impairments: Suspected delayed Swallow;Cough - Immediate    Nectar Thick Nectar Thick Liquid: Not tested   Honey Thick Honey Thick Liquid: Not tested   Puree Puree: Within functional limits Presentation: Self Fed;Spoon   Solid   GO    Solid: Impaired Presentation: Self Fed Oral Phase Impairments: Impaired anterior to posterior transit;Reduced lingual movement/coordination;Reduced labial seal Oral Phase Functional Implications: Right anterior spillage      Herbie Baltimore, MA CCC-SLP [phone]  Lynann Beaver 06/12/2014,2:14 PM

## 2014-06-12 NOTE — Progress Notes (Signed)
Pt arrived from ED at 0220 VS's stable and no complaints of pain. Pt is very aphasic and somewhat confused. RN will continue to monitor.

## 2014-06-12 NOTE — H&P (Signed)
[facility] History and Physical  Patient: Ian Gonzales  MRN: [id number]  DOB: 1940/09/22  DOS: the patient was seen and examined on 06/12/2014 PCP: Penni Homans, MD  Referring physician: Dr. Lita Mains Chief Complaint: Slurred speech  HPI: Ian Gonzales is a 74 y.o. 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 The patient is presenting with complaints of slurred speech. This all started around 7 PM tonight. He mentions he has recently seen his oncologist and then seen his PCP. He went to refill his prescriptions and was taking all his medications regularly. Today he was at home and felt something wrong, he waited for 30 minutes and watch TV but did not felt better. Later he and his neighbor were eating together and his neighbor 5 that the patient was speaking clearly was also having difficulty eating. With a concern for stroke they called EMS and the EMS arrived and brought the patient to the hospital. Patient mentions after seeing his PCP he was given a new medication name of which she does not know but he has taken it from [place]. He denies any other changes in his medications. Does not have any complaints of chest pain and abdominal pain. Does not have any dizziness lightheadedness. Does not have any diarrhea or burning urination. He was tachycardic and hypertensive with the EMS but her blood pressure and tachycardia improved on his arrival. He denies any alcohol abuse denies any drug abuse.  The patient is coming from home. And at his baseline independent for most of his ADL.  Review of Systems: as mentioned in the history of present illness.  A comprehensive review of the other systems is negative.  Past Medical History  Diagnosis Date  . Hypertension   . GERD (gastroesophageal reflux disease)   . Colon polyps   . Back pain, lumbosacral   .  Sinusitis acute 03/24/2011  . Memory loss 04/16/2011  . Allergic state 05/30/2011  . Leukocytosis 12/15/2011  . Hyperlipidemia 02/15/2012  . Unspecified sinusitis (chronic) 04/05/2012  . Epicondylitis 10/10/2012  . Shortness of breath     WITH EXERTION AND BACK PAIN  . Wound drainage     L ELBOW  . Spinal stenosis, lumbar   . Nocturia   . Headache 05/05/2014   Past Surgical History  Procedure Laterality Date  . Lumbar laminectomy  1974    twice  . Urethral stricture dilatation    . Colonoscopy    . Penile prosthesis implant    . Cataracts      REMOVED  . Decompressive lumbar laminectomy level 2 N/A 11/26/2012    Procedure: CENTRAL DECOMPRESSION LUMBAR LAMINECTOMY L3-L4,  L4-L5   ;  Surgeon: Tobi Bastos, MD;  Location: [facility];  Service: Orthopedics;  Laterality: N/A;  . Chest tube insertion Left 05/31/2014    Procedure: INSERTION OF LEFT PLEURAL DRAINAGE CATHETER;  Surgeon: Grace Isaac, MD;  Location: [place];  Service: Thoracic;  Laterality: Left;  . Pleural effusion drainage Left 05/31/2014    Procedure: DRAINAGE OF LEFT PLEURAL EFFUSION;  Surgeon: Grace Isaac, MD;  Location: [place];  Service: Thoracic;  Laterality: Left;   Social History:  reports that he has been smoking Cigarettes.  He has a 15.75 pack-year smoking history. He has quit using smokeless tobacco. His smokeless tobacco use included Chew. He reports that he drinks alcohol. He reports that he does not use illicit  drugs.  Allergies  Allergen Reactions  . Ibuprofen Other (See Comments)    "It made me feel worse"  . Gabapentin     disequilibrium  . Valacyclovir Other (See Comments)    constipation    History reviewed. No pertinent family history.  Prior to Admission medications   Medication Sig Start Date End Date Taking? Authorizing Provider  acetaminophen (TYLENOL) 325 MG tablet Take 650 mg by mouth every morning.   Yes Historical Provider, MD  amLODipine (NORVASC) 5 MG tablet Take 1 tablet (5 mg total)  by mouth daily. for high blood pressure 05/31/14  Yes Barton Dubois, MD  aspirin 81 MG tablet Take 1 tablet (81 mg total) by mouth daily. Hold until 06/01/14; due to pleurex catheter placement; then resume therapy as previously indictaed 05/31/14  Yes Barton Dubois, MD  naproxen sodium (ALEVE) 220 MG tablet Take 2 tablets (440 mg total) by mouth every morning. Hold until 06/01/14; due to pleurex catheter placement; then resume therapy as previously indictaed 05/31/14  Yes Barton Dubois, MD  ranitidine (ZANTAC) 300 MG tablet take 1 tablet by mouth at bedtime 03/15/14  Yes Mosie Lukes, MD  nicotine (NICODERM CQ) 14 mg/24hr patch Place 1 patch (14 mg total) onto the skin daily. Patient not taking: Reported on 06/01/2014 05/31/14   Barton Dubois, MD  sodium polystyrene (KAYEXALATE) 15 GM/60ML suspension Take 60 mLs (15 g total) by mouth once. Patient not taking: Reported on 06/09/2014 06/08/14   Mosie Lukes, MD  traMADol (ULTRAM) 50 MG tablet Take 1 tablet (50 mg total) by mouth every 8 (eight) hours as needed for severe pain. Patient not taking: Reported on 06/12/2014 05/31/14   Barton Dubois, MD    Physical Exam: Filed Vitals:   06/12/14 0106 06/12/14 0115 06/12/14 0200 06/12/14 0230  BP: 147/77 153/77 129/68   Pulse:  101 106   Temp:      TempSrc:      Resp:  17 23   Height:    '5\' 9"'$  (1.753 m)  Weight:    81.1 kg (178 lb 12.7 oz)  SpO2:  97% 96%     General: Alert, Awake and Oriented to Time, Place and Person. Appear in mild distress Eyes: PERRL ENT: Oral Mucosa clear moist. Neck: no JVD Cardiovascular: S1 and S2 Present, no Murmur, Peripheral Pulses Present Respiratory: Bilateral Air entry equal and Decreased,  Clear to Auscultation, on Crackles, no wheezes Abdomen: Bowel Sound present, Soft and non tender Skin: no Rash Extremities: no Pedal edema, no calf tenderness Neurologic: Mental status alert awake and oriented, expressive aphasia with difficulty speaking the words  dysarthria. Cranial Nervepupils are reactive and extraocular muscle movement intact. Motor strength bilaterally adequate strength against gravity but left more than right. Sensation absent on right side light touch, reflexes present, babinski equivocal, pronator drift on the right positive, Cerebellar test difficulty with finger-nose-finger bilaterally.  Labs on Admission:  CBC:  Recent Labs Lab 06/07/14 1613 06/11/14 2333 06/11/14 2338  WBC 15.4* 17.6*  --   NEUTROABS 13.0* 13.5*  --   HGB 11.7* 11.3* 12.2*  HCT 35.3* 33.6* 36.0*  MCV 92.3 89.8  --   PLT 558.0* 585*  --     CMP     Component Value Date/Time   NA 138 06/11/2014 2338   NA 140 05/26/2014 1014   K 3.7 06/11/2014 2338   K 4.2 05/26/2014 1014   CL 106 06/11/2014 2338   CO2 19* 06/11/2014 2333   CO2 21*  05/26/2014 1014   GLUCOSE 130* 06/11/2014 2338   GLUCOSE 119 05/26/2014 1014   GLUCOSE 101* 02/18/2006 0944   BUN 28* 06/11/2014 2338   BUN 19.1 05/26/2014 1014   CREATININE 0.80 06/11/2014 2338   CREATININE 0.8 05/26/2014 1014   CREATININE 0.77 05/04/2014 1458   CALCIUM 8.7* 06/11/2014 2333   CALCIUM 7.9* 05/26/2014 1014   PROT 6.9 06/11/2014 2333   PROT 7.2 05/26/2014 1014   ALBUMIN 2.9* 06/11/2014 2333   ALBUMIN 3.2* 05/26/2014 1014   AST 21 06/11/2014 2333   AST 14 05/26/2014 1014   ALT 25 06/11/2014 2333   ALT 16 05/26/2014 1014   ALKPHOS 77 06/11/2014 2333   ALKPHOS 89 05/26/2014 1014   BILITOT 0.4 06/11/2014 2333   BILITOT 0.31 05/26/2014 1014   GFRNONAA >60 06/11/2014 2333   GFRAA >60 06/11/2014 2333    No results for input(s): LIPASE, AMYLASE in the last 168 hours.  No results for input(s): CKTOTAL, CKMB, CKMBINDEX, TROPONINI in the last 168 hours. BNP (last 3 results)  Recent Labs  05/30/14 1630  BNP 25.2    ProBNP (last 3 results) No results for input(s): PROBNP in the last 8760 hours.   Radiological Exams on Admission: Ct Head (brain) Wo Contrast  06/11/2014   CLINICAL  DATA:  Right-sided weakness, acute onset  EXAM: CT HEAD WITHOUT CONTRAST  TECHNIQUE: Contiguous axial images were obtained from the base of the skull through the vertex without intravenous contrast.  COMPARISON:  PET-CT scan 428 1,016, head CT 05/04/2014  FINDINGS: No acute intracranial hemorrhage. No focal mass lesion. No CT evidence of acute infarction. No midline shift or mass effect. No hydrocephalus. Basilar cisterns are patent. Mild subcortical white matter hypodensities. Paranasal sinuses and mastoid air cells are clear.  IMPRESSION: 1. No CT evidence of acute infarction. 2. No intracranial hemorrhage. 3. Scattered subcortical white matter hypodensities consistent with microvascular disease. Findings conveyed toKirkpatrick, Ian Gonzales 06/11/2014  at23:54.   Electronically Signed   By: Suzy Bouchard M.D.   On: 06/11/2014 23:55   Dg Chest Port 1 View  06/12/2014   CLINICAL DATA:  Altered mental status  EXAM: PORTABLE CHEST - 1 VIEW  COMPARISON:  06/08/2014, 05/31/2014  FINDINGS: The left pleural effusion and loculated pleural collections persist, probably unchanged from the PET examination of 06/08/2014. The right lung is clear. Pulmonary vasculature is normal. There is mild unchanged aortic tortuosity. Heart size is upper normal, unchanged.  IMPRESSION: Left effusion and loculated pleural collections, as observed on the recent PET examination. Right lung is clear.   Electronically Signed   By: Andreas Newport M.D.   On: 06/12/2014 00:17   EKG: Independently reviewed. sinus tachycardia.  Assessment/Plan Principal Problem:   CVA (cerebral vascular accident) Active Problems:   TOBACCO USER   Essential hypertension   GERD   Hyperlipidemia   Malignant pleural effusion   1. CVA (cerebral vascular accident) The patient is presenting with complaints of slurred speech and difficulty eating with facial droop. The patient is oriented to time place and person. Initial CT scan does not show any acute  evidence of ischemia. Lab work also are unremarkable. Examination is concerning for CVA affecting the right side and speech. We will get MRI of the brain in the morning. Suppository aspirin. Patient remains nothing by mouth with PTOT and speech evaluation in the morning. Permissive hypertension with blood pressure correction only if it is affordable 20 systolic or 258 diastolic.  2. 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  The patient is following up with oncology as an outpatient. Patient is due for an MRI with contrast for staging. I will get MRI with and without contrast here in the hospital.  3. essential hypertension. Holding blood pressure medication and permissive hypertension at present.  4. GERD. Pepcid twice a day.  5. active smoker. Nicotine patch   Advance goals of care discussion: Full code as per my discussion with patient RN was present at the bedside Patient wants his friend Elicia Lamp as his power of attorney at present as he does not get along with his brother or sister.   Consults:  ED physician discussed withDr. Leonel Ramsay from neurology.  DVT Prophylaxis: mechanical compression device  Nutrition:  nothing by mouth  Disposition: Admitted as inpatient, step-down unit.  Author: Berle Mull, MD [facility] Pager: [phone] 06/12/2014  If 7PM-7AM, please contact night-coverage [email or web address] Password [email or web address]

## 2014-06-12 NOTE — Consult Note (Signed)
Neurology Consultation Reason for Consult: Stroke Referring Physician: Lita Mains, D  CC: Aphasia  History is obtained from:patient  HPI: Ian Gonzales is a 74 y.o. male with a history of recently diagnosed suspected lung cancer awaiting formal tissue diagnosis who presents with speech difficulty since about 7pm tonight. He was at home and fel tsomethign was wrong about 7 pm. He had family come by later and they noticed that he was having difficulty with his speech and called 911.    LKW: 7pm tpa given?: no, out of window    ROS: Unable to obtain due to altered mental status.   Past Medical History  Diagnosis Date  . Hypertension   . GERD (gastroesophageal reflux disease)   . Colon polyps   . Back pain, lumbosacral   . Sinusitis acute 03/24/2011  . Memory loss 04/16/2011  . Allergic state 05/30/2011  . Leukocytosis 12/15/2011  . Hyperlipidemia 02/15/2012  . Unspecified sinusitis (chronic) 04/05/2012  . Epicondylitis 10/10/2012  . Shortness of breath     WITH EXERTION AND BACK PAIN  . Wound drainage     L ELBOW  . Spinal stenosis, lumbar   . Nocturia   . Headache 05/05/2014    Family History: Unable to assess secondary to patient's altered mental status.    Social History: Tob: Unable to assess secondary to patient's altered mental status.    Exam: Current vital signs: BP 145/71 mmHg  Pulse 113  Temp(Src) 97.8 F (36.6 C) (Oral)  Resp 22  Ht 6' (1.829 m)  Wt 81.194 kg (179 lb)  BMI 24.27 kg/m2  SpO2 99% Vital signs in last 24 hours: Temp:  [97.8 F (36.6 C)] 97.8 F (36.6 C) (05/02 0001) Pulse Rate:  [113] 113 (05/01 2349) Resp:  [22] 22 (05/01 2349) BP: (145)/(71) 145/71 mmHg (05/01 2357) SpO2:  [99 %] 99 % (05/01 2349) Weight:  [81.194 kg (179 lb)] 81.194 kg (179 lb) (05/01 2349)  Physical Exam  Constitutional: Appears well-developed and well-nourished.  Psych: Mildly agitated and very frustrated.  Eyes: No scleral injection HENT: No OP  obstrucion Head: Normocephalic.  Cardiovascular: Normal rate and regular rhythm.  Respiratory: Effort normal and breath sounds normal to anterior ascultation GI: Soft.  No distension. There is no tenderness.  Skin: WDI  Neuro: Mental Status: Patient is awake, alert, oriented to person, month, but gives wrong age He has a mixed aphasia mild to moderate in severity Cranial Nerves: II: Visual Fields are full. Pupils are equal, round, and reactive to light.   III,IV, VI: EOMI without ptosis or diploplia.  V: Facial sensation is decreased on right to pin VII: Facial movement is notable for right facial weakness VIII: hearing is intact to voice X: Uvula elevates symmetrically XI: Shoulder shrug is symmetric. XII: tongue deviates to the right Motor: Tone is normal. Bulk is normal. 5/5 strength was present in all four extremities.  Sensory: Sensation is decreased on the right Cerebellar: Patient refuses, but no clear ataxia seen         I have reviewed labs in epic and the results pertinent to this consultation are: Chem 8 - unremarkable  I have reviewed the images obtained:CT head - neg acute  Impression: 74 yo M with sudden onset aphasia and right facial weakness. I suspect that he has had a stroke, but this will need to be confirmed with MRI. Given the malignancy, would perform contrasted MRI  Recommendations: 1. HgbA1c, fasting lipid panel 2. MRI brain w/ contrast, MRA  of the brain without contrast 3. Frequent neuro checks 4. Echocardiogram 5. Carotid dopplers 6. Prophylactic therapy-Antiplatelet med: Aspirin - dose '325mg'$  PO or '300mg'$  PR 7. Risk factor modification 8. Telemetry monitoring 9. PT consult, OT consult, Speech consult    Roland Rack, MD [facility] [phone]  If 7pm- 7am, please page neurology on call as listed in [place].

## 2014-06-12 NOTE — Progress Notes (Signed)
  Echocardiogram 2D Echocardiogram has been performed.  Ian Gonzales 06/12/2014, 3:19 PM

## 2014-06-12 NOTE — Progress Notes (Signed)
[place] TEAM 1 - Stepdown/ICU TEAM Progress Note  Ian Gonzales MVE:[id number] DOB: 15-Feb-1940 DOA: 06/11/2014 PCP: Penni Homans, MD  Admit HPI / Brief Narrative: 74 y.o. male with history of hypertension, GERD, recent diagnosis of malignant pleural effusion with possible lung malignancy s/p Pleurx catheter, and active tobacco abuse who presented with the acute onset of slurred speech.  EMS brought the patient to the hospital.  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 rate 18, height '5\' 9"'$  (1.753 m), weight 81.1 kg (178 lb 12.7 oz), SpO2 98 %.  Intake/Output Summary (Last 24 hours) at 06/12/14 1453 Last data filed at 06/12/14 1300  Gross per 24 hour  Intake     50 ml  Output    500 ml  Net   -450 ml   Exam: Follow-up visit completed  Data Reviewed: Basic Metabolic Panel:  Recent Labs Lab 06/07/14 1613 06/09/14 1347 06/11/14 2333 06/11/14 2338 06/12/14 0530  NA 133* 134* 136 138 137  K 6.1* 4.4 3.5 3.7 3.5  CL 101 101 103 106 105  CO2 21 24 19*  --  21*  GLUCOSE 60* 103* 128* 130* 122*  BUN 17 21 24* 28* 19  CREATININE 0.81 1.22 1.09 0.80 0.77  CALCIUM 9.2 9.5 8.7*  --  8.7*    CBC:  Recent Labs Lab 06/07/14 1613  06/11/14 2333 06/11/14 2338 06/12/14 0530  WBC 15.4* 17.6*  --  14.0*  NEUTROABS 13.0* 13.5*  --  10.5*  HGB 11.7* 11.3* 12.2* 10.3*  HCT 35.3* 33.6* 36.0* 30.6*  MCV 92.3 89.8  --  88.2  PLT 558.0* 585*  --  564*    Liver Function Tests:  Recent Labs Lab 06/11/14 2333 06/12/14 0530  AST 21 17  ALT 25 22  ALKPHOS 77 73  BILITOT 0.4 0.5  PROT 6.9 6.8  ALBUMIN 2.9* 2.7*    Coags:  Recent Labs Lab 06/11/14 2333 06/12/14 0530  INR 1.14 1.16    Recent Labs Lab 06/11/14 2333  APTT 46*   CBG:  Recent Labs Lab 06/08/14 1236 06/11/14 2351 06/12/14 1208  GLUCAP 109* 130* 106*    Recent Results (from the past 240 hour(s))  MRSA PCR Screening     Status: None   Collection Time: 06/12/14  2:37 AM  Result Value Ref Range Status   MRSA by PCR NEGATIVE NEGATIVE Final    Comment:        The GeneXpert MRSA Assay (FDA approved for NASAL specimens only), is one component of a comprehensive MRSA colonization surveillance program. It is not intended to diagnose MRSA infection nor to guide or monitor treatment for MRSA infections.      Studies:   Recent x-ray studies have been reviewed in detail by the Attending Physician  Scheduled Meds:  Scheduled Meds: . aspirin  300 mg Rectal Daily  Or  . aspirin  325 mg Oral Daily  . famotidine (PEPCID) IV  20 mg Intravenous Q12H  . nicotine  14 mg Transdermal Daily    Time spent on care of this patient: No charge   Cherene Altes , MD   [facility] Office  [phone] Pager - Text Page per Amion as per below:  On-Call/Text Page:      [email or web address]      password [email or web address]  If 7PM-7AM, please contact night-coverage [email or web address] Password [email or web address] 06/12/2014, 2:53 PM   LOS: 0 days

## 2014-06-13 ENCOUNTER — Encounter (HOSPITAL_COMMUNITY): Payer: Self-pay | Admitting: Radiology

## 2014-06-13 ENCOUNTER — Inpatient Hospital Stay (HOSPITAL_COMMUNITY): Payer: Medicare Other

## 2014-06-13 DIAGNOSIS — E785 Hyperlipidemia, unspecified: Secondary | ICD-10-CM

## 2014-06-13 DIAGNOSIS — I6523 Occlusion and stenosis of bilateral carotid arteries: Secondary | ICD-10-CM

## 2014-06-13 DIAGNOSIS — Z720 Tobacco use: Secondary | ICD-10-CM

## 2014-06-13 DIAGNOSIS — F05 Delirium due to known physiological condition: Secondary | ICD-10-CM

## 2014-06-13 DIAGNOSIS — R410 Disorientation, unspecified: Secondary | ICD-10-CM | POA: Diagnosis present

## 2014-06-13 DIAGNOSIS — R4182 Altered mental status, unspecified: Secondary | ICD-10-CM

## 2014-06-13 DIAGNOSIS — J910 Malignant pleural effusion: Secondary | ICD-10-CM

## 2014-06-13 DIAGNOSIS — I10 Essential (primary) hypertension: Secondary | ICD-10-CM

## 2014-06-13 DIAGNOSIS — I6522 Occlusion and stenosis of left carotid artery: Secondary | ICD-10-CM

## 2014-06-13 DIAGNOSIS — C3482 Malignant neoplasm of overlapping sites of left bronchus and lung: Secondary | ICD-10-CM

## 2014-06-13 DIAGNOSIS — I639 Cerebral infarction, unspecified: Principal | ICD-10-CM

## 2014-06-13 LAB — LIPID PANEL
CHOL/HDL RATIO: 4.4 ratio
CHOLESTEROL: 153 mg/dL (ref 0–200)
HDL: 35 mg/dL — AB (ref >40)
LDL CALC: 101 mg/dL — AB (ref 0–99)
TRIGLYCERIDES: 83 mg/dL (ref <150)
VLDL: 17 mg/dL (ref 0–40)

## 2014-06-13 LAB — GLUCOSE, CAPILLARY
GLUCOSE-CAPILLARY: 100 mg/dL — AB (ref 70–99)
Glucose-Capillary: 124 mg/dL — ABNORMAL HIGH (ref 70–99)

## 2014-06-13 LAB — HEMOGLOBIN A1C
Hgb A1c MFr Bld: 6.3 % — ABNORMAL HIGH (ref 4.8–5.6)
Mean Plasma Glucose: 134 mg/dL

## 2014-06-13 MED ORDER — GADOBENATE DIMEGLUMINE 529 MG/ML IV SOLN: 17.0000 mL | Freq: Once | INTRAVENOUS | Status: AC | PRN

## 2014-06-13 MED ORDER — ASPIRIN 81 MG PO CHEW
81.0000 mg | CHEWABLE_TABLET | Freq: Every day | ORAL | Status: DC
  Administered 2014-06-14 – 2014-06-17 (×4): 81 mg via ORAL
  Filled 2014-06-13 (×4): qty 1

## 2014-06-13 MED ORDER — CLOPIDOGREL BISULFATE 75 MG PO TABS
75.0000 mg | ORAL_TABLET | Freq: Every day | ORAL | Status: DC
  Administered 2014-06-13 – 2014-06-17 (×5): 75 mg via ORAL
  Filled 2014-06-13 (×6): qty 1

## 2014-06-13 MED ORDER — ATORVASTATIN CALCIUM 20 MG PO TABS
20.0000 mg | ORAL_TABLET | Freq: Every day | ORAL | Status: DC
  Administered 2014-06-13 – 2014-06-17 (×5): 20 mg via ORAL
  Filled 2014-06-13 (×5): qty 1

## 2014-06-13 MED ORDER — QUETIAPINE FUMARATE 25 MG PO TABS
25.0000 mg | ORAL_TABLET | Freq: Three times a day (TID) | ORAL | Status: DC | PRN
  Filled 2014-06-13: qty 1

## 2014-06-13 NOTE — Progress Notes (Signed)
[facility] - Stepdown/ICU TEAM Progress Note  Ian Gonzales YWV:[id number] DOB: 1941-01-03 DOA: 5/Gonzales/2016 PCP: Penni Homans, MD  Admit HPI / Brief Narrative: 74 y.o. WM PMHx memory loss, hypertension, HLD, spinal stenosis lumbar, GERD, recent diagnosis of lung mass, (cytology pending), malignant pleural effusion s/p Pleurx catheter,active tobacco abuse Presented with the acute onset of slurred speech. EMS brought the patient to the hospital.   HPI/Subjective: 5/3 A/O Gonzales (does not know where, when, why), continues to state that he wishes to die. Patient unable to provide information on his penile implant. States is not married does not have children does not have healthcare power of attorney.   Assessment/Plan: Acute left brain CVA -Initial CT scan unrevealing  -Neurology following  - MRI pending; patient unable to tell us what type of renal implant or where/when he received it. No family available. Radiology has agreed to MRI  Right Carotid artery stenosis -Right internal carotid 70% stenosis; consult vascular surgery  Hypopharynx tumor/Parotid tumor -Consult ENT consider biopsy  Left Malignant pleural effusion - suspected primary lung cancer -4/20 pleural fluid nondiagnostic - Spoke off line with PCCM and they agree that considering patient's acute illness bronchoscopy would not be appropriate.  Hypertension -Would allow permissive HTN secondary to CVA  Hyperlipidemia -Lipid panel pending -Start Lipitor 20 mg daily  GERD  Tobacco abuse -Continue Nicoderm Patch  Social issues -Currently patient is not competent to make his own medical decisions and does not appear that patient has family/friends to make his decisions. -Discuss making patient wore of the state until he becomes competent. -Psychiatry consult pending    Code Status: FULL Family Communication: no family present at time of exam Disposition Plan:    Consultants: Dr.McNeill Adrian Prows  (neurology)    Procedure/Significant Events: 4/20 left pleural fluid; unable to make definitive diagnosis regarding malignancy 5/2 CT angiogram head;. Occlusion Lt internal carotid artery with reconstituted flow at the skullbase. - 70% stenosis Rt internal carotid artery  -. Asymmetric attenuation of MCA branch vessels on the left corresponding with the area of acute/subacute nonhemorrhagic infarction within the left parietal and probably posterior left frontal lobe. - 4.2 cm soft tissue mass anterior and superior to the right sternocleidomastoid muscle. Parotid  Tumor? vs  Metastatic disease -Nodular thickening  pleura in the left chest compatible with known metastatic disease. -Loculated fluid versus rounded atelectasis or tumor in the left lung is stable. - Asymmetric soft tissue in the posterior right hypopharynx Tumor? This area should be amenable to evaluation.  Culture   Antibiotics:   DVT prophylaxis: SCD   Devices    LINES / TUBES:      Continuous Infusions:   Objective: VITAL SIGNS: Temp: 99.Gonzales F (37.3 C) (05/05 0400) Temp Source: Oral (05/05 0400) BP: 146/80 mmHg (05/05 0600) Pulse Rate: 96 (05/05 0600) SPO2; FIO2:   Intake/Output Summary (Last 24 hours) at 06/15/14 0755 Last data filed at 06/15/14 0600  Gross per 24 hour  Intake    120 ml  Output    700 ml  Net   -580 ml     Exam: General: A/O Gonzales (does not know where, when, why), No acute respiratory distress Lungs: right lung fields clear to auscultation, LUL some air movement but decreased, lingula/LLL poor to no air movement appreciated on auscultation, negative wheezes/crackles  Cardiovascular: Tachycardic, Regular rhythm without murmur gallop or rub normal S1 and S2 Abdomen: Nontender, nondistended, soft, bowel sounds positive, no rebound, no ascites, no appreciable mass Extremities:  No significant cyanosis, clubbing, or edema bilateral lower extremities Neurologic; cranial nerves II  through XII intact, tongue/uvula midline, pupils equal round reactive to light and accommodation, positive left facial droop, positive dysarthria, right upper extremity strength Gonzales/5, right lower extremity strength 3/5, left upper extremity/left lower extremity strength 5/5, did not ambulate patient.  Data Reviewed: Basic Metabolic Panel:  Recent Labs Lab 06/09/14 1347 06/11/14 2333 06/11/14 2338 06/12/14 0530 06/14/14 0301  NA 134* 136 138 137 136  K 4.4 3.5 3.7 3.5 3.4*  CL 101 103 106 105 102  CO2 24 19*  --  21* 20*  GLUCOSE 103* 128* 130* 122* 98  BUN 21 24* 28* 19 17  CREATININE Gonzales.22 Gonzales.09 0.80 0.77 0.79  CALCIUM 9.5 8.7*  --  8.7* 8.9   Liver Function Tests:  Recent Labs Lab 06/11/14 2333 06/12/14 0530 06/14/14 0301  AST '21 17 19  '$ ALT '25 22 17  '$ ALKPHOS 77 73 76  BILITOT 0.4 0.5 0.8  PROT 6.9 6.8 6.6  ALBUMIN 2.9* 2.7* 2.8*   No results for input(s): LIPASE, AMYLASE in the last 168 hours. No results for input(s): AMMONIA in the last 168 hours. CBC:  Recent Labs Lab 06/11/14 2333 06/11/14 2338 06/12/14 0530 06/14/14 0301  WBC 17.6*  --  14.0* 15.8*  NEUTROABS 13.5*  --  10.5* 11.8*  HGB 11.3* 12.2* 10.3* 10.6*  HCT 33.6* 36.0* 30.6* 31.3*  MCV 89.8  --  88.2 88.4  PLT 585*  --  564* 568*   Cardiac Enzymes: No results for input(s): CKTOTAL, CKMB, CKMBINDEX, TROPONINI in the last 168 hours. BNP (last 3 results)  Recent Labs  05/30/14 1630  BNP 25.2    ProBNP (last 3 results) No results for input(s): PROBNP in the last 8760 hours.  CBG:  Recent Labs Lab 06/11/14 2351 06/12/14 1208 06/13/14 0815 06/13/14 1257 06/14/14 1220  GLUCAP 130* 106* 124* 100* 111*    Recent Results (from the past 240 hour(s))  MRSA PCR Screening     Status: None   Collection Time: 06/12/14  2:37 AM  Result Value Ref Range Status   MRSA by PCR NEGATIVE NEGATIVE Final    Comment:        The GeneXpert MRSA Assay (FDA approved for NASAL specimens only), is one  component of a comprehensive MRSA colonization surveillance program. It is not intended to diagnose MRSA infection nor to guide or monitor treatment for MRSA infections.      Studies:  Recent x-ray studies have been reviewed in detail by the Attending Physician  Scheduled Meds:  Scheduled Meds: . aspirin  81 mg Oral Daily  . atorvastatin  20 mg Oral q1800  . clopidogrel  75 mg Oral Daily  . famotidine  20 mg Oral Daily  . nicotine  14 mg Transdermal Daily    Time spent on care of this patient: 40 mins   Hamlin Devine, Geraldo Docker , MD  [facility] Office  [phone] Pager [phone]  On-Call/Text Page:      [email or web address]      password [email or web address]  If 7PM-7AM, please contact night-coverage [email or web address] Password [email or web address] 06/15/2014, 7:55 AM   LOS: 3 days   Care during the described time interval was provided by me .  I have reviewed this patient's available data, including medical history, events of note, physical examination, radiology studies and test results as part of my evaluation  Dia Crawford, MD [phone] Pager

## 2014-06-13 NOTE — Consult Note (Signed)
[facility] Consult   Reason for Consult:  Capacity evaluation Referring Physician:  Dr. Sherral Hammers Patient Identification: TALHA ISER MRN:  [id number] Principal Diagnosis: CVA (cerebral vascular accident) altered mental status Diagnosis:   Patient Active Problem List   Diagnosis Date Noted  . CVA (cerebral vascular accident) [I63.9] 06/12/2014  . CVA (cerebral infarction) [I63.9] 06/12/2014  . Carotid stenosis [I65.29]   . 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 Recurrent left pleural effusion [J90] 05/30/2014  . Pleural effusion on left [J94.8] 05/26/2014  . Lung mass [R91.8] 05/22/2014  . Pleural effusion [J90] 05/05/2014  . Headache [R51] 05/05/2014  . Hyperglycemia [R73.9] 09/11/2013  . Pain of left calf [M79.662] 05/29/2013  . Unspecified hereditary and idiopathic peripheral neuropathy [G60.9] 04/24/2013  . 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 Epicondylitis [M77.9] 10/10/2012  . Personal history of colonic polyps [Z86.010] 09/23/2012  . Postherpetic neuralgia [B02.29] 09/01/2012  . Hyperlipidemia [E78.5] 02/15/2012  . Allergic state [T78.40XA] 05/30/2011  . Abdominal pain [R10.9] 05/22/2011  . Memory loss [R41.3] 04/16/2011  . Preventative health care [Z00.00] 03/21/2011  . Back pain, lumbosacral [M54.5, M54.89]   . SCIATICA [M54.30] 02/21/2010  . GERD [K21.9] 11/05/2009  . TOBACCO USER [Z72.0] 10/22/2009  . COPD, severity to be determined [J44.9] 05/03/2009  . Essential hypertension [I10] May 12, 2008  . 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 ERECTILE DYSFUNCTION [F52.8] 10/28/2006  . 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 male patient admitted with slurred speech.  HPI: Ian Gonzales is a 74 y.o. male admitted to [facility] with recent onset of slurred speech, not able to eat and  not feeling well. Patient is a poor historian and continued to have slurred speech and weakness on one side of the body. Patient has limited insight and judgment into his current medical conditions and mental status. Patient has a hard time to understand his complicated and complex medical problems and comprehending recommended acute treatment. Patient knows his first name, last name, name of his best friend, his age, but unable to name the hospital and does not know where he is and why he is here in the hospital. Patient reported he has no family members and no children. Reportedly his wife passed away here May 13, 1998. He has been living independently and go out to eat with a friend from time to time. Patient was found with the back pain restless irritable and trying to get out of the bed without any purpose. Patient is not cooperative with the staff members. Patient does not have any complaints of chest pain and abdominal pain. Does not have any dizziness lightheadedness. Does not have any diarrhea or burning urination. He denies any alcohol abuse denies any drug abuse. Patient has no known history of mental illness.   Past Medical History:  Past Medical History  Diagnosis Date  . Hypertension   . GERD (gastroesophageal reflux disease)   . Colon polyps   . Back pain, lumbosacral   . Sinusitis acute 03/24/2011  . Memory loss 04/16/2011  . Allergic state 05/30/2011  . Leukocytosis 12/15/2011  . Hyperlipidemia 02/15/2012  . Unspecified sinusitis (chronic) 04/05/2012  . Epicondylitis 10/10/2012  . Shortness of breath     WITH EXERTION AND BACK PAIN  . Wound drainage     L ELBOW  . Spinal stenosis, lumbar   . Nocturia   .  Headache 05/05/2014    Past Surgical History  Procedure Laterality Date  . Lumbar laminectomy  1974    twice  . Urethral stricture dilatation    . Colonoscopy    . Penile prosthesis implant    . Cataracts      REMOVED  . Decompressive lumbar laminectomy level 2 N/A 11/26/2012     Procedure: CENTRAL DECOMPRESSION LUMBAR LAMINECTOMY L3-L4,  L4-L5   ;  Surgeon: Tobi Bastos, MD;  Location: [facility];  Service: Orthopedics;  Laterality: N/A;  . Chest tube insertion Left 05/31/2014    Procedure: INSERTION OF LEFT PLEURAL DRAINAGE CATHETER;  Surgeon: Grace Isaac, MD;  Location: [place];  Service: Thoracic;  Laterality: Left;  . Pleural effusion drainage Left 05/31/2014    Procedure: DRAINAGE OF LEFT PLEURAL EFFUSION;  Surgeon: Grace Isaac, MD;  Location: [place];  Service: Thoracic;  Laterality: Left;   Family History: History reviewed. No pertinent family history. Social History:  History  Alcohol Use  . 0.0 oz/week  . 0 Standard drinks or equivalent per week    Comment: once in a while      History  Drug Use No    History   Social History  . Marital Status: Single    Spouse Name: N/A  . Number of Children: 0  . Years of Education: N/A   Occupational History  . retired    Social History Main Topics  . Smoking status: Current Every Day Smoker -- 0.25 packs/day for 63 years    Types: Cigarettes  . Smokeless tobacco: Former Systems developer    Types: Chew     Comment: 3-5 cigs daily  . Alcohol Use: 0.0 oz/week    0 Standard drinks or equivalent per week     Comment: once in a while   . Drug Use: No  . Sexual Activity: No   Other Topics Concern  . None   Social History Narrative   Additional Social History:                          Allergies:   Allergies  Allergen Reactions  . Ibuprofen Other (See Comments)    "It made me feel worse"  . Gabapentin     disequilibrium  . Valacyclovir Other (See Comments)    constipation    Labs:  Results for orders placed or performed during the hospital encounter of 06/11/14 (from the past 48 hour(s))  Protime-INR     Status: None   Collection Time: 06/11/14 11:33 PM  Result Value Ref Range   Prothrombin Time 14.7 11.6 - 15.2 seconds   INR 1.14 0.00 - 1.49  APTT     Status: Abnormal   Collection  Time: 06/11/14 11:33 PM  Result Value Ref Range   aPTT 46 (H) 24 - 37 seconds    Comment:        IF BASELINE aPTT IS ELEVATED, SUGGEST PATIENT RISK ASSESSMENT BE USED TO DETERMINE APPROPRIATE ANTICOAGULANT THERAPY.   CBC     Status: Abnormal   Collection Time: 06/11/14 11:33 PM  Result Value Ref Range   WBC 17.6 (H) 4.0 - 10.5 K/uL   RBC 3.74 (L) 4.22 - 5.81 MIL/uL   Hemoglobin 11.3 (L) 13.0 - 17.0 g/dL   HCT 33.6 (L) 39.0 - 52.0 %   MCV 89.8 78.0 - 100.0 fL   MCH 30.2 26.0 - 34.0 pg   MCHC 33.6 30.0 - 36.0 g/dL  RDW 13.1 11.5 - 15.5 %   Platelets 585 (H) 150 - 400 K/uL  Differential     Status: Abnormal   Collection Time: 06/11/14 11:33 PM  Result Value Ref Range   Neutrophils Relative % 77 43 - 77 %   Lymphocytes Relative 12 12 - 46 %   Monocytes Relative 10 3 - 12 %   Eosinophils Relative 1 0 - 5 %   Basophils Relative 0 0 - 1 %   Neutro Abs 13.5 (H) 1.7 - 7.7 K/uL   Lymphs Abs 2.1 0.7 - 4.0 K/uL   Monocytes Absolute 1.8 (H) 0.1 - 1.0 K/uL   Eosinophils Absolute 0.2 0.0 - 0.7 K/uL   Basophils Absolute 0.0 0.0 - 0.1 K/uL   RBC Morphology TARGET CELLS   Comprehensive metabolic panel     Status: Abnormal   Collection Time: 06/11/14 11:33 PM  Result Value Ref Range   Sodium 136 135 - 145 mmol/L   Potassium 3.5 3.5 - 5.1 mmol/L   Chloride 103 101 - 111 mmol/L   CO2 19 (L) 22 - 32 mmol/L   Glucose, Bld 128 (H) 70 - 99 mg/dL   BUN 24 (H) 6 - 20 mg/dL   Creatinine, Ser 1.09 0.61 - 1.24 mg/dL   Calcium 8.7 (L) 8.9 - 10.3 mg/dL   Total Protein 6.9 6.5 - 8.1 g/dL   Albumin 2.9 (L) 3.5 - 5.0 g/dL   AST 21 15 - 41 U/L   ALT 25 17 - 63 U/L   Alkaline Phosphatase 77 38 - 126 U/L   Total Bilirubin 0.4 0.3 - 1.2 mg/dL   GFR calc non Af Amer >60 >60 mL/min   GFR calc Af Amer >60 >60 mL/min    Comment: (NOTE) The eGFR has been calculated using the CKD EPI equation. This calculation has not been validated in all clinical situations. eGFR's persistently <90 mL/min signify  possible Chronic Kidney Disease.    Anion gap 14 5 - 15  Pathologist smear review     Status: None   Collection Time: 06/11/14 11:33 PM  Result Value Ref Range   Path Review NORMOCYTIC ANEMIA, LEUKOCYTOSIS, THROMBOCYTOSIS     Comment: Reviewed By Violet Baldy, M.D. 06/12/14   Ethanol     Status: None   Collection Time: 06/11/14 11:34 PM  Result Value Ref Range   Alcohol, Ethyl (B) <5 <5 mg/dL    Comment:        LOWEST DETECTABLE LIMIT FOR SERUM ALCOHOL IS 11 mg/dL FOR MEDICAL PURPOSES ONLY   I-stat troponin, ED (not at [facility], [facility])     Status: None   Collection Time: 06/11/14 11:37 PM  Result Value Ref Range   Troponin i, poc 0.04 0.00 - 0.08 ng/mL   Comment 3            Comment: Due to the release kinetics of cTnI, a negative result within the first hours of the onset of symptoms does not rule out myocardial infarction with certainty. If myocardial infarction is still suspected, repeat the test at appropriate intervals.   I-Stat Chem 8, ED  (not at [facility], [facility])     Status: Abnormal   Collection Time: 06/11/14 11:38 PM  Result Value Ref Range   Sodium 138 135 - 145 mmol/L   Potassium 3.7 3.5 - 5.1 mmol/L   Chloride 106 101 - 111 mmol/L   BUN 28 (H) 6 - 20 mg/dL   Creatinine, Ser 0.80 0.61 - 1.24 mg/dL  Glucose, Bld 130 (H) 70 - 99 mg/dL   Calcium, Ion 6.75 (L) 1.13 - 1.30 mmol/L   TCO2 18 0 - 100 mmol/L   Hemoglobin 12.2 (L) 13.0 - 17.0 g/dL   HCT 25.6 (L) 59.9 - 67.9 %  CBG monitoring, ED     Status: Abnormal   Collection Time: 06/11/14 11:51 PM  Result Value Ref Range   Glucose-Capillary 130 (H) 70 - 99 mg/dL  Urine rapid drug screen (hosp performed)     Status: None   Collection Time: 06/12/14  2:08 AM  Result Value Ref Range   Opiates NONE DETECTED NONE DETECTED   Cocaine NONE DETECTED NONE DETECTED   Benzodiazepines NONE DETECTED NONE DETECTED   Amphetamines NONE DETECTED NONE DETECTED   Tetrahydrocannabinol NONE DETECTED NONE DETECTED   Barbiturates NONE  DETECTED NONE DETECTED    Comment:        DRUG SCREEN FOR MEDICAL PURPOSES ONLY.  IF CONFIRMATION IS NEEDED FOR ANY PURPOSE, NOTIFY LAB WITHIN 5 DAYS.        LOWEST DETECTABLE LIMITS FOR URINE DRUG SCREEN Drug Class       Cutoff (ng/mL) Amphetamine      1000 Barbiturate      200 Benzodiazepine   200 Tricyclics       300 Opiates          300 Cocaine          300 THC              50   MRSA PCR Screening     Status: None   Collection Time: 06/12/14  2:37 AM  Result Value Ref Range   MRSA by PCR NEGATIVE NEGATIVE    Comment:        The GeneXpert MRSA Assay (FDA approved for NASAL specimens only), is one component of a comprehensive MRSA colonization surveillance program. It is not intended to diagnose MRSA infection nor to guide or monitor treatment for MRSA infections.   Hemoglobin A1c     Status: Abnormal   Collection Time: 06/12/14  3:31 AM  Result Value Ref Range   Hgb A1c MFr Bld 6.3 (H) 4.8 - 5.6 %    Comment: (NOTE)         Pre-diabetes: 5.7 - 6.4         Diabetes: >6.4         Glycemic control for adults with diabetes: <7.0    Mean Plasma Glucose 134 mg/dL    Comment: (NOTE) Performed At: [facility] [address] [place], [place] [id number] Mila Homer MD KX:[id number]   Lipid panel     Status: Abnormal   Collection Time: 06/12/14  3:31 AM  Result Value Ref Range   Cholesterol 158 0 - 200 mg/dL   Triglycerides 67 <149 mg/dL   HDL 36 (L) >08 mg/dL   Total CHOL/HDL Ratio 4.4 RATIO   VLDL 13 0 - 40 mg/dL   LDL Cholesterol 769 (H) 0 - 99 mg/dL    Comment:        Total Cholesterol/HDL:CHD Risk Coronary Heart Disease Risk Table                     Men   Women  1/2 Average Risk   3.4   3.3  Average Risk       5.0   4.4  2 X Average Risk   9.6   7.1  3 X Average Risk  23.4  11.0        Use the calculated Patient Ratio above and the CHD Risk Table to determine the patient's CHD Risk.        ATP III CLASSIFICATION (LDL):  <100      mg/dL   Optimal  100-129  mg/dL   Near or Above                    Optimal  130-159  mg/dL   Borderline  160-189  mg/dL   High  >190     mg/dL   Very High   CBC with Differential/Platelet     Status: Abnormal   Collection Time: 06/12/14  5:30 AM  Result Value Ref Range   WBC 14.0 (H) 4.0 - 10.5 K/uL   RBC 3.47 (L) 4.22 - 5.81 MIL/uL   Hemoglobin 10.3 (L) 13.0 - 17.0 g/dL   HCT 30.6 (L) 39.0 - 52.0 %   MCV 88.2 78.0 - 100.0 fL   MCH 29.7 26.0 - 34.0 pg   MCHC 33.7 30.0 - 36.0 g/dL   RDW 13.0 11.5 - 15.5 %   Platelets 564 (H) 150 - 400 K/uL   Neutrophils Relative % 75 43 - 77 %   Neutro Abs 10.5 (H) 1.7 - 7.7 K/uL   Lymphocytes Relative 12 12 - 46 %   Lymphs Abs 1.6 0.7 - 4.0 K/uL   Monocytes Relative 12 3 - 12 %   Monocytes Absolute 1.7 (H) 0.1 - 1.0 K/uL   Eosinophils Relative 1 0 - 5 %   Eosinophils Absolute 0.1 0.0 - 0.7 K/uL   Basophils Relative 0 0 - 1 %   Basophils Absolute 0.0 0.0 - 0.1 K/uL  Comprehensive metabolic panel     Status: Abnormal   Collection Time: 06/12/14  5:30 AM  Result Value Ref Range   Sodium 137 135 - 145 mmol/L   Potassium 3.5 3.5 - 5.1 mmol/L   Chloride 105 101 - 111 mmol/L   CO2 21 (L) 22 - 32 mmol/L   Glucose, Bld 122 (H) 70 - 99 mg/dL   BUN 19 6 - 20 mg/dL   Creatinine, Ser 0.77 0.61 - 1.24 mg/dL   Calcium 8.7 (L) 8.9 - 10.3 mg/dL   Total Protein 6.8 6.5 - 8.1 g/dL   Albumin 2.7 (L) 3.5 - 5.0 g/dL   AST 17 15 - 41 U/L   ALT 22 17 - 63 U/L   Alkaline Phosphatase 73 38 - 126 U/L   Total Bilirubin 0.5 0.3 - 1.2 mg/dL   GFR calc non Af Amer >60 >60 mL/min   GFR calc Af Amer >60 >60 mL/min    Comment: (NOTE) The eGFR has been calculated using the CKD EPI equation. This calculation has not been validated in all clinical situations. eGFR's persistently <90 mL/min signify possible Chronic Kidney Disease.    Anion gap 11 5 - 15  Protime-INR     Status: None   Collection Time: 06/12/14  5:30 AM  Result Value Ref Range   Prothrombin Time  15.0 11.6 - 15.2 seconds   INR 1.16 0.00 - 1.49  Lactic acid, plasma     Status: None   Collection Time: 06/12/14  5:30 AM  Result Value Ref Range   Lactic Acid, Venous 0.9 0.5 - 2.0 mmol/L  Glucose, capillary     Status: Abnormal   Collection Time: 06/12/14 12:08 PM  Result Value Ref Range   Glucose-Capillary 106 (H) 70 -  99 mg/dL   Comment 1 Notify RN   Glucose, capillary     Status: Abnormal   Collection Time: 06/13/14  8:15 AM  Result Value Ref Range   Glucose-Capillary 124 (H) 70 - 99 mg/dL    Vitals: Blood pressure 125/74, pulse 104, temperature 98 F (36.7 C), temperature source Oral, resp. rate 16, height $RemoveBe'5\' 9"'lwvKrpUse$  (1.753 m), weight 80.6 kg (177 lb 11.1 oz), SpO2 96 %.  Risk to Self: Is patient at risk for suicide?: No Risk to Others:   Prior Inpatient Therapy:   Prior Outpatient Therapy:    Current Facility-Administered Medications  Medication Dose Route Frequency Provider Last Rate Last Dose  . acetaminophen (TYLENOL) tablet 500 mg  500 mg Oral Q6H PRN Cherene Altes, MD      . aspirin tablet 325 mg  325 mg Oral Daily Lavina Hamman, MD   325 mg at 06/13/14 1025  . atorvastatin (LIPITOR) tablet 20 mg  20 mg Oral q1800 Allie Bossier, MD   20 mg at 06/13/14 1025  . famotidine (PEPCID) tablet 20 mg  20 mg Oral Daily Cherene Altes, MD   20 mg at 06/13/14 1025  . nicotine (NICODERM CQ - dosed in mg/24 hours) patch 14 mg  14 mg Transdermal Daily Lavina Hamman, MD   14 mg at 06/13/14 1025  . ondansetron (ZOFRAN) injection 4 mg  4 mg Intravenous Q6H PRN Lavina Hamman, MD      . oxyCODONE (Oxy IR/ROXICODONE) immediate release tablet 5 mg  5 mg Oral Q4H PRN Cherene Altes, MD        Musculoskeletal: Strength & Muscle Tone: decreased Gait & Station: unable to stand Patient leans: N/A  Psychiatric Specialty Exam: Physical Exam as per history and physical   ROS as mentioned in the history of present illness. A comprehensive review of the other systems is negative.   Blood pressure 125/74, pulse 104, temperature 98 F (36.7 C), temperature source Oral, resp. rate 16, height $RemoveBe'5\' 9"'[id number]$  (1.753 m), weight 80.6 kg (177 lb 11.1 oz), SpO2 96 %.Body mass index is 26.23 kg/(m^2).  General Appearance: Disheveled and Guarded  Eye Sport and exercise psychologist::  Fair  Speech:  Blocked, Slow and Slurred  Volume:  Decreased  Mood:  Anxious and Irritable  Affect:  Appropriate and Congruent  Thought Process:  Irrelevant, Loose and Tangential  Orientation:  Full (Time, Place, and Person)  Thought Content:  WDL  Suicidal Thoughts:  No  Homicidal Thoughts:  No  Memory:  Immediate;   Fair Recent;   Poor  Judgement:  Impaired  Insight:  Lacking  Psychomotor Activity:  Restlessness  Concentration:  Fair  Recall:  [employer] of Knowledge:Fair  Language: Good  Akathisia:  Negative  Handed:  Right  AIMS (if indicated):     Assets:  Communication Skills Housing Leisure Time Resilience  ADL's:  Impaired  Cognition: Impaired,  Moderate  Sleep:      Medical Decision Making: New problem, with additional work up planned, Review of Psycho-Social Stressors (1), Review or order clinical lab tests (1), Review of Last Therapy Session (1), Review or order medicine tests (1), Review of Medication Regimen & Side Effects (2) and Review of New Medication or Change in Dosage (2)  Treatment Plan Summary: Daily contact with patient to assess and evaluate symptoms and progress in treatment and Medication management  Plan:  Case discussed with Dr. Sherral Hammers and notified that patient does not meet criteria for capacity to  make his own medical decisions due to recent cardiovascular event and altered mental status probably subacute delirium Patient does not meet criteria for capacity to make his own medical decisions and living arrangements as per my evaluation today Patient needed medical care of power of attorney and guardianship Recommend Seroquel 25 mg 3 times a day for agitationSecondary to delirium   Patient does not meet criteria for psychiatric inpatient admission. Supportive therapy provided about ongoing stressors. Appreciate psychiatric consultation Please contact [phone] or [phone] if needs further assistance   Disposition: Referred to the social service regarding finding appropriate guardianship and medical care power of attorney  Zyair Russi,JANARDHAHA R. 06/13/2014 11:08 AM

## 2014-06-13 NOTE — Consult Note (Signed)
Hospital Consult  Reason for Consult:  ICA stenosis Referring Physician:  Dr. Thereasa Solo  History of Present Illness: This is a 74 y.o. male who presented to the hospital a couple of days ago with slurred speech, difficulty eating and facial droop and right sided weakness.  His girlfriend Maisie states that a couple of days before this incident, he did have what she described as a shade over his left eye that did resolve.  On Saturday night, the pt called his girlfriend Maisie and she ws having great difficulty understanding him and she advised him to call 911.    EMS was called and the pt was brought to the hospital with possible stroke.   He has been evaluated by speech therapy and is on a dysphagia 2 (fine chop); thin diet.  He has been placed on a statin since admission.  He is being followed by neurology.    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  CTA of the neck revealed occlusion of the LICA with reconstituted flow at the skull base.  There is a 70% stenosis of the RICA just beyond the bifurcation.    He is a current smoker.   On June 05, 2014, he underwent drainage of a large pleural effusion with u/s and fluoro guidance and placement of a PleurX catheter by Dr. Servando Snare.   On his CTA, he is found to have a 4.2 cm soft tissue mass anterior and superior to the right sternocleidomastoid muscle.  This could be related to the tail of the parotid, representing a parotid tumor or could also be related to metastatic disease.  There is also an asymmetric soft tissue in the posterior right hypopharynx is concerning for tumor. This area should be amenable to evaluation.  He does have suspected primary lung cancer.   Past Medical History  Diagnosis Date  . Hypertension   . GERD (gastroesophageal reflux disease)   . Colon polyps   . Back pain, lumbosacral   . Sinusitis acute 03/24/2011  . Memory loss 04/16/2011  . Allergic  state 05/30/2011  . Leukocytosis 12/15/2011  . Hyperlipidemia 02/15/2012  . Unspecified sinusitis (chronic) 04/05/2012  . Epicondylitis 10/10/2012  . Shortness of breath     WITH EXERTION AND BACK PAIN  . Wound drainage     L ELBOW  . Spinal stenosis, lumbar   . Nocturia   . Headache 05/05/2014    Past Surgical History  Procedure Laterality Date  . Lumbar laminectomy  1974    twice  . Urethral stricture dilatation    . Colonoscopy    . Penile prosthesis implant    . Cataracts      REMOVED  . Decompressive lumbar laminectomy level 2 N/A 11/26/2012    Procedure: CENTRAL DECOMPRESSION LUMBAR LAMINECTOMY L3-L4,  L4-L5   ;  Surgeon: Tobi Bastos, MD;  Location: [facility];  Service: Orthopedics;  Laterality: N/A;  . Chest tube insertion Left 05/31/2014    Procedure: INSERTION OF LEFT PLEURAL DRAINAGE CATHETER;  Surgeon: Grace Isaac, MD;  Location: [place];  Service: Thoracic;  Laterality: Left;  . Pleural effusion drainage Left 05/31/2014    Procedure: DRAINAGE OF LEFT PLEURAL EFFUSION;  Surgeon: Grace Isaac, MD;  Location: [place];  Service: Thoracic;  Laterality: Left;    Allergies  Allergen Reactions  . Ibuprofen Other (See Comments)    "It made me feel worse"  . Gabapentin  disequilibrium  . Valacyclovir Other (See Comments)    constipation    Prior to Admission medications   Medication Sig Start Date End Date Taking? Authorizing Provider  acetaminophen (TYLENOL) 325 MG tablet Take 650 mg by mouth every morning.   Yes Historical Provider, MD  amLODipine (NORVASC) 5 MG tablet Take 1 tablet (5 mg total) by mouth daily. for high blood pressure 05/31/14  Yes Barton Dubois, MD  aspirin 81 MG tablet Take 1 tablet (81 mg total) by mouth daily. Hold until 06/01/14; due to pleurex catheter placement; then resume therapy as previously indictaed 05/31/14  Yes Barton Dubois, MD  naproxen sodium (ALEVE) 220 MG tablet Take 2 tablets (440 mg total) by mouth every morning. Hold until  06/01/14; due to pleurex catheter placement; then resume therapy as previously indictaed 05/31/14  Yes Barton Dubois, MD  ranitidine (ZANTAC) 300 MG tablet take 1 tablet by mouth at bedtime 03/15/14  Yes Mosie Lukes, MD  nicotine (NICODERM CQ) 14 mg/24hr patch Place 1 patch (14 mg total) onto the skin daily. Patient not taking: Reported on 06/01/2014 05/31/14   Barton Dubois, MD  sodium polystyrene (KAYEXALATE) 15 GM/60ML suspension Take 60 mLs (15 g total) by mouth once. Patient not taking: Reported on 06/09/2014 06/08/14   Mosie Lukes, MD  traMADol (ULTRAM) 50 MG tablet Take 1 tablet (50 mg total) by mouth every 8 (eight) hours as needed for severe pain. Patient not taking: Reported on 06/12/2014 05/31/14   Barton Dubois, MD    History   Social History  . Marital Status: Single    Spouse Name: N/A  . Number of Children: 0  . Years of Education: N/A   Occupational History  . retired    Social History Main Topics  . Smoking status: Current Every Day Smoker -- 0.25 packs/day for 63 years    Types: Cigarettes  . Smokeless tobacco: Former Systems developer    Types: Chew     Comment: 3-5 cigs daily  . Alcohol Use: 0.0 oz/week    0 Standard drinks or equivalent per week     Comment: once in a while   . Drug Use: No  . Sexual Activity: No   Other Topics Concern  . Not on file   Social History Narrative   History reviewed. No pertinent family history.-His parents are both deceased.  He states his mother died in her 80's and father in his 81's and cause is unknown for both parents.  ROS: '[x]'$  Positive   '[ ]'$  Negative   '[ ]'$  All sytems reviewed and are negative  Cardiovascular: '[]'$  chest pain/pressure '[]'$  palpitations '[]'$  SOB lying flat '[]'$  DOE '[]'$  pain in legs while walking '[]'$  pain in legs at rest '[]'$  pain in legs at night '[]'$  non-healing ulcers '[]'$  hx of DVT '[]'$  swelling in legs  Pulmonary: '[]'$  productive cough '[]'$  asthma/wheezing '[]'$  home O2  Neurologic: '[x]'$  weakness in right arm/leg '[]'$   numbness in '[]'$  arms '[]'$  legs '[x]'$  hx of CVA '[]'$  mini stroke '[x]'$ difficulty speaking or slurred speech '[x]'$  temporary loss of vision in left eye '[]'$  dizziness  Hematologic: '[x]'$  hx of cancer-possible primary lung malignancy '[]'$  bleeding problems '[]'$  problems with blood clotting easily  Endocrine:   '[]'$  diabetes '[]'$  thyroid disease  GI '[]'$  vomiting blood '[]'$  blood in stool '[x]'$  GERD '[x]'$  hx colon polyps  GU: '[]'$  CKD/renal failure '[]'$  HD--'[]'$  M/W/F or '[]'$  T/T/S '[]'$  burning with urination '[]'$  blood in urine  Psychiatric: '[]'$  anxiety '[]'$  depression  Musculoskeletal: '[]'$  arthritis '[]'$  joint pain '[x]'$  lower back pain   Integumentary: '[]'$  rashes '[]'$  ulcers  Constitutional: '[]'$  fever '[]'$  chills   Physical Examination  Filed Vitals:   06/13/14 1100  BP: 108/67  Pulse: 88  Temp:   Resp: 21   Body mass index is 26.23 kg/(m^2).  General:  WDWN in NAD Gait: Not observed HENT: WNL, normocephalic Pulmonary: normal non-labored breathing, without Rales, rhonchi,  wheezing Cardiac: regular, without  Murmurs, rubs or gallops; with carotid bruit on the right Abdomen: soft, NT/ND, no masses Skin: without rashes, without ulcers  Vascular Exam/Pulses:  Right Left  Radial 2+ (normal) 2+ (normal)  Ulnar Unable to palpate  Unable to palpate   Femoral 2+ (normal) 2+ (normal)  Popliteal trace trace  DP 2+ (normal) 2+ (normal)  PT Unable to palpate  Unable to palpate    Extremities: without ischemic changes, without Gangrene , without cellulitis; without open wounds;  Musculoskeletal: no muscle wasting or atrophy  Neurologic: A&O X 3; speech is difficult to understand.  3/5 RUE/RLE & 5/5 LUE/LLE; +facial droop with asymmetric smile; tongue deviation to the right. Lymph:  No inguinal lymphadenopathy   CBC    Component Value Date/Time   WBC 14.0* 06/12/2014 0530   WBC 14.3* 05/26/2014 1014   RBC 3.47* 06/12/2014 0530   RBC 4.25 05/26/2014 1014   HGB 10.3* 06/12/2014 0530   HGB 12.7*  05/26/2014 1014   HCT 30.6* 06/12/2014 0530   HCT 38.9 05/26/2014 1014   PLT 564* 06/12/2014 0530   PLT 564* 05/26/2014 1014   MCV 88.2 06/12/2014 0530   MCV 91.4 05/26/2014 1014   MCH 29.7 06/12/2014 0530   MCH 29.8 05/26/2014 1014   MCHC 33.7 06/12/2014 0530   MCHC 32.6 05/26/2014 1014   RDW 13.0 06/12/2014 0530   RDW 12.9 05/26/2014 1014   LYMPHSABS 1.6 06/12/2014 0530   LYMPHSABS 1.7 05/26/2014 1014   MONOABS 1.7* 06/12/2014 0530   MONOABS 1.3* 05/26/2014 1014   EOSABS 0.1 06/12/2014 0530   EOSABS 0.3 05/26/2014 1014   BASOSABS 0.0 06/12/2014 0530   BASOSABS 0.1 05/26/2014 1014    BMET    Component Value Date/Time   NA 137 06/12/2014 0530   NA 140 05/26/2014 1014   K 3.5 06/12/2014 0530   K 4.2 05/26/2014 1014   CL 105 06/12/2014 0530   CO2 21* 06/12/2014 0530   CO2 21* 05/26/2014 1014   GLUCOSE 122* 06/12/2014 0530   GLUCOSE 119 05/26/2014 1014   GLUCOSE 101* 02/18/2006 0944   BUN 19 06/12/2014 0530   BUN 19.1 05/26/2014 1014   CREATININE 0.77 06/12/2014 0530   CREATININE 0.8 05/26/2014 1014   CREATININE 0.77 05/04/2014 1458   CALCIUM 8.7* 06/12/2014 0530   CALCIUM 7.9* 05/26/2014 1014   GFRNONAA >60 06/12/2014 0530   GFRAA >60 06/12/2014 0530    COAGS: Lab Results  Component Value Date   INR 1.16 06/12/2014   INR 1.14 06/11/2014   INR 1.13 05/31/2014     Non-Invasive Vascular Imaging:   1.  Carotid duplex 06/12/14: Findings suggest >80% right internal carotid artery stenosis. The left internal carotid artery exhibits an atypical thumped waveform, suggestive of possible distal obstruction. The left external carotid artery exhibits elevated velocities suggestive of stenosis. Vertebral arteries are patent with antegrade flow bilaterally.   2.  CTA Head/Neck 06/12/14 IMPRESSION: 1. Occlusion of the left internal carotid artery with reconstituted flow at the skullbase. 2. 70% stenosis of the right internal carotid  artery just beyond the bifurcation. 3.  Atherosclerotic changes at the arch without significant proximal stenosis. 4. Atherosclerotic changes in the cavernous right internal carotid artery with tapering of the vessel but no significant stenosis relative to the more distal vessels. 5. Asymmetric attenuation of MCA branch vessels on the left corresponding with the area of acute/subacute nonhemorrhagic infarction within the left parietal and probably posterior left frontal lobe. 6. Moderate diffuse medium and small vessel disease. 7. 4.2 cm soft tissue mass anterior and superior to the right sternocleidomastoid muscle. This could be related to the tail of the parotid, representing a parotid tumor. Metastatic disease is also considered. 8. Nodular thickening of the pleura in the left chest compatible with known metastatic disease. 9. Loculated fluid versus rounded atelectasis or tumor in the left lung is stable. 10. Asymmetric soft tissue in the posterior right hypopharynx is concerning for tumor. This area should be amenable to evaluation.  3.  PET Scan 06/08/14: IMPRESSION: Moderate loculated left pleural effusion with indwelling pleural catheter.  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  Focal hypermetabolism in the medial left lower lobe/infrahilar region could reflect the primary lesion, although this is poorly evaluated.  Osseous metastases, as above. Intramuscular metastasis in the left posterior back.  Hypermetabolic, partially calcified right level II cervical node. Metastasis is not excluded, although this appearance/distribution is unusual. Consider biopsy and/or surgical excision.  Associated asymmetric hypermetabolism along the posterior right hypopharynx, without definite mass on CT. Consider visual inspection to exclude primary head/neck cancer.  2D echo results are pending  Statin:  Yes.   Beta  Blocker:  No. Aspirin:  Yes.   ACEI:  No. ARB:  No. Other antiplatelets/anticoagulants:  No.    ASSESSMENT/PLAN: This is a 74 y.o. male with left brain stroke, right hemiparesis, dysarthria, and amaurosis fugax left eye  -pt with left brain stroke.  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  PET scan also revealed osseous metastases and intramuscular metastasis in the left posterior back -MRI is pending -pt has been started on a statin/aspirin -pt on puree diet -Dr. Scot Dock to see pt later today for further recommendations -girlfriend Maisie would like to be called [place], PA-C [facility] [phone]  Agree with above. The patient has had a left brain stroke and has a left ICA occlusion. Thus no surgical intervention is indicated on the left. Plexus suggesting greater than 80% right carotid stenosis however this is asymptomatic. Given his history of cancer with possible metastatic disease I would agree with Dr. Leonie Man, that for now this should be treated medically and I can follow him as an outpatient. I will see him back with a carotid duplex scan in 6 months. If he is doing well overall from a medical standpoint at that time, consideration could be given to right carotid endarterectomy. However currently I do not think that he is a good candidate for surgery.  Deitra Mayo, MD, [place] [phone] Office: [phone]

## 2014-06-13 NOTE — Evaluation (Signed)
Speech Language Pathology Evaluation Patient Details Name: Ian Gonzales MRN: [id number] DOB: January 09, 1941 Today's Date: 06/13/2014 Time: 0910-0927 SLP Time Calculation (min) (ACUTE ONLY): 17 min  Problem List:  Patient Active Problem List   Diagnosis Date Noted  . CVA (cerebral vascular accident) 06/12/2014  . CVA (cerebral infarction) 06/12/2014  . Carotid stenosis   . Malignant pleural effusion   . Recurrent left pleural effusion 05/30/2014  . Pleural effusion on left 05/26/2014  . Lung mass 05/22/2014  . Pleural effusion 05/05/2014  . Headache 05/05/2014  . Hyperglycemia 09/11/2013  . Pain of left calf 05/29/2013  . Unspecified hereditary and idiopathic peripheral neuropathy 04/24/2013  . Spinal stenosis, lumbar region, with neurogenic claudication 11/26/2012  . Epicondylitis 10/10/2012  . Personal history of colonic polyps 09/23/2012  . Postherpetic neuralgia 09/01/2012  . Hyperlipidemia 02/15/2012  . Allergic state 05/30/2011  . Abdominal pain 05/22/2011  . Memory loss 04/16/2011  . Preventative health care 03/21/2011  . Back pain, lumbosacral   . SCIATICA 02/21/2010  . GERD 11/05/2009  . TOBACCO USER 10/22/2009  . COPD, severity to be determined 05/03/2009  . Essential hypertension 04/18/2008  . ROSACEA 04/06/2008  . ERECTILE DYSFUNCTION 10/28/2006  . 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 Hypertension   . GERD (gastroesophageal reflux disease)   . Colon polyps   . Back pain, lumbosacral   . Sinusitis acute 03/24/2011  . Memory loss 04/16/2011  . Allergic state 05/30/2011  . Leukocytosis 12/15/2011  . Hyperlipidemia 02/15/2012  . Unspecified sinusitis (chronic) 04/05/2012  . Epicondylitis 10/10/2012  . Shortness of breath     WITH EXERTION AND BACK PAIN  . Wound drainage     L ELBOW  . Spinal stenosis, lumbar   . Nocturia   . Headache 05/05/2014   Past Surgical History:  Past  Surgical History  Procedure Laterality Date  . Lumbar laminectomy  1974    twice  . Urethral stricture dilatation    . Colonoscopy    . Penile prosthesis implant    . Cataracts      REMOVED  . Decompressive lumbar laminectomy level 2 N/A 11/26/2012    Procedure: CENTRAL DECOMPRESSION LUMBAR LAMINECTOMY L3-L4,  L4-L5   ;  Surgeon: Tobi Bastos, MD;  Location: [facility];  Service: Orthopedics;  Laterality: N/A;  . Chest tube insertion Left 05/31/2014    Procedure: INSERTION OF LEFT PLEURAL DRAINAGE CATHETER;  Surgeon: Grace Isaac, MD;  Location: [place];  Service: Thoracic;  Laterality: Left;  . Pleural effusion drainage Left 05/31/2014    Procedure: DRAINAGE OF LEFT PLEURAL EFFUSION;  Surgeon: Grace Isaac, MD;  Location: [place];  Service: Thoracic;  Laterality: Left;   HPI:  [name] is a 74 y.o. 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 He did not receive IV t-PA due to delay in arrival. Pt with left CVA, MRI pending.    Assessment / Plan / Recommendation Clinical Impression  Brief cognitive linguistic eval complete; pts function worsened today by lethargy and unwillingness to participate. Pt was found to have moderate dysarthria due to right lingual and labial weakness, but is 50% intelligible. Pt also with mild to moderate expressive and receptive deficits with difficulty following 2 step commands, difficulty naming familiar objects. Awareness and memory also impaired as pt  unable to recall new information and precuations. Again suspect function would be better if he were well rested and fully alert. Will /fu for further attempts and diagnositc treatment. Pt would benefit from CIR at d/c.     SLP Assessment  Patient needs continued Speech Lanaguage Pathology Services    Follow Up Recommendations  Inpatient  Rehab    Frequency and Duration min 2x/week  2 weeks   Pertinent Vitals/Pain Pain Assessment: No/denies pain   SLP Goals  Progression toward goals: Progressing toward goals Potential to Achieve Goals (ACUTE ONLY): Good  SLP Evaluation Prior Functioning  Cognitive/Linguistic Baseline: Information not available Type of Home: House Available Help at Discharge: Friend(s);Available PRN/intermittently   Cognition  Overall Cognitive Status: Impaired/Different from baseline Arousal/Alertness: Lethargic Orientation Level: Oriented to person;Disoriented to situation Attention: Focused;Sustained Focused Attention: Appears intact Sustained Attention: Appears intact Memory: Impaired Memory Impairment: Decreased recall of new information;Decreased short term memory Awareness: Impaired Awareness Impairment: Intellectual impairment;Emergent impairment Problem Solving: Appears intact Safety/Judgment: Impaired    Comprehension  Auditory Comprehension Overall Auditory Comprehension: Impaired Yes/No Questions: Not tested Commands: Impaired One Step Basic Commands: 75-100% accurate Two Step Basic Commands: 25-49% accurate Conversation: Simple Reading Comprehension Reading Status: Not tested    Expression Expression Primary Mode of Expression: Verbal Verbal Expression Overall Verbal Expression: Impaired Initiation: No impairment Automatic Speech: Name;Social Response Level of Generative/Spontaneous Verbalization: Conversation Repetition: No impairment Naming: Impairment Confrontation: Impaired Pragmatics: No impairment Interfering Components: Speech intelligibility   Oral / Motor Oral Motor/Sensory Function Overall Oral Motor/Sensory Function: Impaired Labial ROM: Reduced right Labial Symmetry: Abnormal symmetry right Labial Strength: Reduced Labial Sensation: Reduced Lingual ROM: Reduced right Lingual Symmetry: Abnormal symmetry right Lingual Strength: Reduced Lingual  Sensation: Reduced Facial ROM: Reduced right Facial Symmetry: Right drooping eyelid;Right droop Facial Strength: Reduced Facial Sensation: Reduced Velum: Within Functional Limits Mandible: Within Functional Limits Motor Speech Overall Motor Speech: Impaired Respiration: Within functional limits Phonation: Normal Resonance: Within functional limits Articulation: Impaired Level of Impairment: Word Intelligibility: Intelligibility reduced Word: 50-74% accurate Phrase: 25-49% accurate Sentence: 25-49% accurate Conversation: 25-49% accurate Motor Planning: Witnin functional limits Motor Speech Errors: Aware;Consistent   GO    Herbie [place], MA CCC-SLP [phone]  Lynann Beaver 06/13/2014, 10:25 AM

## 2014-06-13 NOTE — Progress Notes (Signed)
STROKE TEAM PROGRESS NOTE   HISTORY Ian Gonzales is a 74 y.o. male with a history of recently diagnosed suspected lung cancer awaiting formal tissue diagnosis who presents with speech difficulty since about 7pm tonight 06/11/2014. He was at home and felt something was wrong about 7 pm. He had family come by later and they noticed that he was having difficulty with his speech and called 911. He presented to the ED at 2330. Patient was not administered TPA secondary to delay in arrival. He was admitted for further evaluation and treatment.   SUBJECTIVE (INTERVAL HISTORY) His RN is at the bedside, no friends/family.  Overall he feels his condition is unchanged. 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 Incidental right posterior hypopharynx soft tissue mass-likely tumor   OBJECTIVE Temp:  [98 F (36.7 C)-99.9 F (37.7 C)] 98 F (36.7 C) (05/03 0754) Pulse Rate:  [25-119] 25 (05/03 1300) Cardiac Rhythm:  [-] Sinus tachycardia (05/03 0750) Resp:  [16-25] 21 (05/03 1300) BP: (108-157)/(67-91) 127/76 mmHg (05/03 1300) SpO2:  [95 %-100 %] 96 % (05/03 1300) Weight:  [177 lb 11.1 oz (80.6 kg)] 177 lb 11.1 oz (80.6 kg) (05/03 0442)   Recent Labs Lab 06/08/14 1236 06/11/14 2351 06/12/14 1208 06/13/14 0815 06/13/14 1257  GLUCAP 109* 130* 106* 124* 100*    Recent Labs Lab 06/07/14 1613 06/09/14 1347 06/11/14 2333 06/11/14 2338 06/12/14 0530  NA 133* 134* 136 138 137  K 6.1* 4.4 3.5 3.7 3.5  CL 101 101 103 106 105  CO2 21 24 19*  --  21*  GLUCOSE 60* 103* 128* 130* 122*  BUN 17 21 24* 28* 19  CREATININE 0.81 1.22 1.09 0.80 0.77  CALCIUM 9.2 9.5 8.7*  --  8.7*    Recent Labs Lab 06/11/14 2333 06/12/14 0530  AST 21 17  ALT 25 22  ALKPHOS 77 73  BILITOT 0.4 0.5  PROT 6.9 6.8  ALBUMIN 2.9* 2.7*    Recent Labs Lab 06/07/14 1613 06/11/14 2333 06/11/14 2338 06/12/14 0530  WBC 15.4* 17.6*  --  14.0*  NEUTROABS 13.0* 13.5*  --  10.5*  HGB 11.7* 11.3*  12.2* 10.3*  HCT 35.3* 33.6* 36.0* 30.6*  MCV 92.3 89.8  --  88.2  PLT 558.0* 585*  --  564*   No results for input(s): CKTOTAL, CKMB, CKMBINDEX, TROPONINI in the last 168 hours.  Recent Labs  06/11/14 2333 06/12/14 0530  LABPROT 14.7 15.0  INR 1.14 1.16   No results for input(s): COLORURINE, LABSPEC, PHURINE, GLUCOSEU, HGBUR, BILIRUBINUR, KETONESUR, PROTEINUR, UROBILINOGEN, NITRITE, LEUKOCYTESUR in the last 72 hours.  Invalid input(s): APPERANCEUR     Component Value Date/Time   CHOL 158 06/12/2014 0331   TRIG 67 06/12/2014 0331   TRIG 70 02/18/2006 0944   HDL 36* 06/12/2014 0331   CHOLHDL 4.4 06/12/2014 0331   CHOLHDL 5.3 CALC 02/18/2006 0944   VLDL 13 06/12/2014 0331   LDLCALC 109* 06/12/2014 0331   Lab Results  Component Value Date   HGBA1C 6.3* 06/12/2014      Component Value Date/Time   LABOPIA NONE DETECTED 06/12/2014 0208   COCAINSCRNUR NONE DETECTED 06/12/2014 0208   LABBENZ NONE DETECTED 06/12/2014 0208   AMPHETMU NONE DETECTED 06/12/2014 0208   THCU NONE DETECTED 06/12/2014 0208   LABBARB NONE DETECTED 06/12/2014 0208     Recent Labs Lab 06/11/14 2334  ETH <5    Ct Angio Head W/cm &/or Wo Cm  06/12/2014   CLINICAL DATA:  Bilateral carotid artery stenosis. Dominant left brain infarct with right hemi paresis and hemi sensory loss. He is  EXAM: CT ANGIOGRAPHY HEAD AND NECK  TECHNIQUE: Multidetector CT imaging of the head and neck was performed using the standard protocol during bolus administration of intravenous contrast. Multiplanar CT image reconstructions and MIPs were obtained to evaluate the vascular anatomy. Carotid stenosis measurements (when applicable) are obtained utilizing NASCET criteria, using the distal internal carotid diameter as the denominator.  CONTRAST:  140m OMNIPAQUE IOHEXOL 350 MG/ML SOLN  COMPARISON:  CT head without contrast 06/11/2014.  FINDINGS: CT HEAD  Brain: A a left parietal and possibly posterior left frontal lobe infarct is  better visualized with loss of gray-white differentiation and sulcal effacement on today's study. There is no significant mass effect to generate ventricular effacement or midline shift. Scattered subcortical white matter hypoattenuation is present throughout.  Calvarium and skull base: Negative  Paranasal sinuses: Clear  Orbits: The globes and orbits are intact.  CTA NECK  Aortic arch: Atherosclerotic irregularity calcifications are present within the aortic arch. A 3 vessel configuration is present. Atherosclerotic calcifications are present at the origins. There is mild narrowing of the proximal left common carotid artery without a significant stenosis relative to the more distal vessel.  Right carotid system: The right common carotid artery demonstrates mild atherosclerotic change without a significant stenosis. There is significant calcified and noncalcified plaque at the bifurcation. The lumen of the right internal carotid artery is narrowed to 1.4 mm. This compares with a more normal distal vessel measurement of 4.5 mm. There no tandem stenoses in the neck.  Left carotid system: More prominent atherosclerotic changes are present in the left common carotid artery without a significant stenosis. The left internal carotid artery is occluded 5 mm from the bifurcation. There is minimal contrast in the left ICA at the skullbase. There is no reconstitution in the neck.  Vertebral arteries:Atherosclerotic changes are present at the origins of the vertebral arteries bilaterally. The vessels originate from the subclavian arteries. The left vertebral artery is the dominant vessel. It is tortuous with some calcifications but no significant stenosis. The right vertebral artery is hypoplastic but without focal stenosis.  Skeleton: Heart degenerative changes are present within cervical spine, most evident at C5-6 with uncovertebral spurring and endplate changes. Endplate changes are also noted at C6-7. There is slight  degenerative anterolisthesis at C3-4 and C4-5. No focal lytic or blastic lesions are evident. The patient is edentulous.  Other neck: A soft tissue mass lesion is noted just lateral to the sternocleidomastoid and posterior to the vertical ramus of mandible measuring 4.2 x 1.8 x 2.2 cm. There is a central calcification. Asymmetric soft tissue is present in the right posterolateral hypopharynx.  There is marked irregularity of the pleura compatible with known metastatic disease on the left. A posterior rounded soft tissue density may represent rounded atelectasis versus metastatic disease or loculated fluid. This was not hypermetabolic on the recent PET scan. Paraseptal emphysematous changes are evident.  CTA HEAD  Anterior circulation: Atherosclerotic changes are present within the cavernous right internal carotid artery there is no scratch the the lumen is narrowed but without a significant stenosis relative to the more distal vessel. The terminal right ICA is intact. The left ICA demonstrates some contrast beginning at the skullbase extending through the cavernous segment. The terminal left ICA is small. The A1 and M1 segments are visualized bilaterally. There is some attenuation of the vessels without a focal stenosis. a.m. MCA branch  vessels are moderately attenuated bilaterally, more prominently on the left. The posterior left M2 branches are occluded. Moderate medium and distal ACA branch vessel stenoses are noted.  Posterior circulation: The a left vertebral artery is the dominant vessel. The vertebrobasilar junction is intact. Both posterior cerebral arteries originate from the basilar tip. A PCA branch vessels demonstrate moderate attenuation.  Venous sinuses: The dural sinuses are patent. Straight sinus is patent. The deep veins are patent.  IMPRESSION: 1. Occlusion of the left internal carotid artery with reconstituted flow at the skullbase. 2. 70% stenosis of the right internal carotid artery just beyond  the bifurcation. 3. Atherosclerotic changes at the arch without significant proximal stenosis. 4. Atherosclerotic changes in the cavernous right internal carotid artery with tapering of the vessel but no significant stenosis relative to the more distal vessels. 5. Asymmetric attenuation of MCA branch vessels on the left corresponding with the area of acute/subacute nonhemorrhagic infarction within the left parietal and probably posterior left frontal lobe. 6. Moderate diffuse medium and small vessel disease. 7. 4.2 cm soft tissue mass anterior and superior to the right sternocleidomastoid muscle. This could be related to the tail of the parotid, representing a parotid tumor. Metastatic disease is also considered. 8. Nodular thickening of the pleura in the left chest compatible with known metastatic disease. 9. Loculated fluid versus rounded atelectasis or tumor in the left lung is stable. 10. Asymmetric soft tissue in the posterior right hypopharynx is concerning for tumor. This area should be amenable to evaluation.   Electronically Signed   By: San Morelle M.D.   On: 06/12/2014 16:46   Ct Head (brain) Wo Contrast  06/11/2014   CLINICAL DATA:  Right-sided weakness, acute onset  EXAM: CT HEAD WITHOUT CONTRAST  TECHNIQUE: Contiguous axial images were obtained from the base of the skull through the vertex without intravenous contrast.  COMPARISON:  PET-CT scan 428 1,016, head CT 05/04/2014  FINDINGS: No acute intracranial hemorrhage. No focal mass lesion. No CT evidence of acute infarction. No midline shift or mass effect. No hydrocephalus. Basilar cisterns are patent. Mild subcortical white matter hypodensities. Paranasal sinuses and mastoid air cells are clear.  IMPRESSION: 1. No CT evidence of acute infarction. 2. No intracranial hemorrhage. 3. Scattered subcortical white matter hypodensities consistent with microvascular disease. Findings conveyed toKirkpatrick, MDon 06/11/2014  at23:54.   Electronically  Signed   By: Suzy Bouchard M.D.   On: 06/11/2014 23:55   Ct Angio Neck W/cm &/or Wo/cm  06/12/2014   CLINICAL DATA:  Bilateral carotid artery stenosis. Dominant left brain infarct with right hemi paresis and hemi sensory loss. He is  EXAM: CT ANGIOGRAPHY HEAD AND NECK  TECHNIQUE: Multidetector CT imaging of the head and neck was performed using the standard protocol during bolus administration of intravenous contrast. Multiplanar CT image reconstructions and MIPs were obtained to evaluate the vascular anatomy. Carotid stenosis measurements (when applicable) are obtained utilizing NASCET criteria, using the distal internal carotid diameter as the denominator.  CONTRAST:  133m OMNIPAQUE IOHEXOL 350 MG/ML SOLN  COMPARISON:  CT head without contrast 06/11/2014.  FINDINGS: CT HEAD  Brain: A a left parietal and possibly posterior left frontal lobe infarct is better visualized with loss of gray-white differentiation and sulcal effacement on today's study. There is no significant mass effect to generate ventricular effacement or midline shift. Scattered subcortical white matter hypoattenuation is present throughout.  Calvarium and skull base: Negative  Paranasal sinuses: Clear  Orbits: The globes and orbits are intact.  CTA  NECK  Aortic arch: Atherosclerotic irregularity calcifications are present within the aortic arch. A 3 vessel configuration is present. Atherosclerotic calcifications are present at the origins. There is mild narrowing of the proximal left common carotid artery without a significant stenosis relative to the more distal vessel.  Right carotid system: The right common carotid artery demonstrates mild atherosclerotic change without a significant stenosis. There is significant calcified and noncalcified plaque at the bifurcation. The lumen of the right internal carotid artery is narrowed to 1.4 mm. This compares with a more normal distal vessel measurement of 4.5 mm. There no tandem stenoses in the  neck.  Left carotid system: More prominent atherosclerotic changes are present in the left common carotid artery without a significant stenosis. The left internal carotid artery is occluded 5 mm from the bifurcation. There is minimal contrast in the left ICA at the skullbase. There is no reconstitution in the neck.  Vertebral arteries:Atherosclerotic changes are present at the origins of the vertebral arteries bilaterally. The vessels originate from the subclavian arteries. The left vertebral artery is the dominant vessel. It is tortuous with some calcifications but no significant stenosis. The right vertebral artery is hypoplastic but without focal stenosis.  Skeleton: Heart degenerative changes are present within cervical spine, most evident at C5-6 with uncovertebral spurring and endplate changes. Endplate changes are also noted at C6-7. There is slight degenerative anterolisthesis at C3-4 and C4-5. No focal lytic or blastic lesions are evident. The patient is edentulous.  Other neck: A soft tissue mass lesion is noted just lateral to the sternocleidomastoid and posterior to the vertical ramus of mandible measuring 4.2 x 1.8 x 2.2 cm. There is a central calcification. Asymmetric soft tissue is present in the right posterolateral hypopharynx.  There is marked irregularity of the pleura compatible with known metastatic disease on the left. A posterior rounded soft tissue density may represent rounded atelectasis versus metastatic disease or loculated fluid. This was not hypermetabolic on the recent PET scan. Paraseptal emphysematous changes are evident.  CTA HEAD  Anterior circulation: Atherosclerotic changes are present within the cavernous right internal carotid artery there is no scratch the the lumen is narrowed but without a significant stenosis relative to the more distal vessel. The terminal right ICA is intact. The left ICA demonstrates some contrast beginning at the skullbase extending through the  cavernous segment. The terminal left ICA is small. The A1 and M1 segments are visualized bilaterally. There is some attenuation of the vessels without a focal stenosis. a.m. MCA branch vessels are moderately attenuated bilaterally, more prominently on the left. The posterior left M2 branches are occluded. Moderate medium and distal ACA branch vessel stenoses are noted.  Posterior circulation: The a left vertebral artery is the dominant vessel. The vertebrobasilar junction is intact. Both posterior cerebral arteries originate from the basilar tip. A PCA branch vessels demonstrate moderate attenuation.  Venous sinuses: The dural sinuses are patent. Straight sinus is patent. The deep veins are patent.  IMPRESSION: 1. Occlusion of the left internal carotid artery with reconstituted flow at the skullbase. 2. 70% stenosis of the right internal carotid artery just beyond the bifurcation. 3. Atherosclerotic changes at the arch without significant proximal stenosis. 4. Atherosclerotic changes in the cavernous right internal carotid artery with tapering of the vessel but no significant stenosis relative to the more distal vessels. 5. Asymmetric attenuation of MCA branch vessels on the left corresponding with the area of acute/subacute nonhemorrhagic infarction within the left parietal and probably posterior left frontal  lobe. 6. Moderate diffuse medium and small vessel disease. 7. 4.2 cm soft tissue mass anterior and superior to the right sternocleidomastoid muscle. This could be related to the tail of the parotid, representing a parotid tumor. Metastatic disease is also considered. 8. Nodular thickening of the pleura in the left chest compatible with known metastatic disease. 9. Loculated fluid versus rounded atelectasis or tumor in the left lung is stable. 10. Asymmetric soft tissue in the posterior right hypopharynx is concerning for tumor. This area should be amenable to evaluation.   Electronically Signed   By:  San Morelle M.D.   On: 06/12/2014 16:46   Dg Chest Port 1 View  06/12/2014   CLINICAL DATA:  Altered mental status  EXAM: PORTABLE CHEST - 1 VIEW  COMPARISON:  06/08/2014, 05/31/2014  FINDINGS: The left pleural effusion and loculated pleural collections persist, probably unchanged from the PET examination of 06/08/2014. The right lung is clear. Pulmonary vasculature is normal. There is mild unchanged aortic tortuosity. Heart size is upper normal, unchanged.  IMPRESSION: Left effusion and loculated pleural collections, as observed on the recent PET examination. Right lung is clear.   Electronically Signed   By: Andreas Newport M.D.   On: 06/12/2014 00:17     PHYSICAL EXAM Elderly Caucasian male currently not in distress. . Afebrile. Head is nontraumatic. Neck is supple with soft bilateral carotid  bruits.    Cardiac exam no murmur or gallop. Lungs are clear to auscultation. Distal pulses are well felt. Neurological Exam : Awake alert mild expressive language difficulties with dysarthria. Good comprehension and understanding. Follows commands well. Extraocular movements are full range but decreased blink to threat on the right compared to the left. Pupils equal reactive. Fundi were not visualized. Vision acuity seems adequate. Mild right lower facial weakness. Tongue midline. Motor system exam mild right upper and lower extremity drift. Weakness of the right grip and intrinsic hand muscles as well as right hip flexors and ankle dorsiflexors 4/5. Diminished right hemibody sensation. Coordination slow but accurate. Plantars are upgoing. Deep tendon reflexes are 2+ symmetric. Gait was not tested. ASSESSMENT/PLAN [name] is a 74 y.o. 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 He did not receive IV t-PA due to delay in arrival.    Stroke:  Dominant left brain infarct, with bilateral extracranial carotid stenosis.    Resultant  Dysarthria, right hemiparesis, right hemisensory loss  MRI  With and without contrast pending   Carotid Doppler  R ICA > 80%, L ICA w/ "thumped" waveform c/w distal obstruction  2D Echo  Left ventricle: The cavity size was normal. Wall thickness was increased in a pattern of mild LVH. Systolic function was normal. The estimated ejection fraction was in the range of 55% to 60%. Doppler parameters are consistent with abnormal left ventricular relaxation (grade 1 diastolic dysfunction).  HgbA1c 6.4  SCDs for VTE prophylaxis DIET - DYS 1 Room service appropriate?: Yes; Fluid consistency:: Thin  aspirin 81 mg orally every day prior to admission, now on aspirin 325 mg orally every day  Ongoing aggressive stroke risk factor management  Therapy recommendations:  pending   Disposition:  pending   ICA stenosis  Has bilateral carotid bruits  Carotid Doppler  R ICA > 80%, L ICA w/ "thumped" waveform c/w distal obstruction  CTA head and neck LICA occlusion in neck and 70% RICA stenosis in neck  Hypertension  Home meds:   norvasc  Stable  Hyperlipidemia  Home meds:  No statin   LDL 109, goal < 70  Add statin  Continue statin at discharge  Other Stroke Risk Factors  Advanced age  Cigarette smoker, advised to stop smoking  ETOH use  Other Active Problems  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 I have made any additions or clarifications directly to the above note. Agree with note above. He he presented with right-sided weakness and speech difficulties CT  suggest left MCA  branch infarct area  And Ct angio shows Lt ICA occlusion and 70 % RICA stenosis.He is likely to need elective Rt carotid revascularization but given h/o cancer and limited life expectancy this may not be practical. Recommend aspirin 81 mg plus Plavix 75 mg daily x 3 months followed by plavix alone.  Antony Contras, MD Medical Director [facility] Pager: [phone] 06/13/2014 3:10 PM    To contact Stroke Continuity provider, please refer to [email or web address] After hours, contact General Neurology

## 2014-06-13 NOTE — Evaluation (Signed)
Occupational Therapy Evaluation Patient Details Name: Ian Gonzales MRN: [id number] DOB: 12-02-1940 Today's Date: 06/13/2014    History of Present Illness Pt admitted with slurred speech and R hemiparesis, awaiting MRI confirmation of CVA.  PMH: Recent dx of malignant pleural effusion, ?lung malignancy, HTN, GERD, smoker.   Clinical Impression   Pt reports functioning independently prior to admission. He presents with impaired cognition, slurred speech, impulsivity and restlessness, R side inattention, R side weakness, and poor balance.  He fatigues easily.  Will follow acutely.  Pt will need post acute rehab.     Follow Up Recommendations  SNF;Supervision/Assistance - 24 hour    Equipment Recommendations       Recommendations for Other Services       Precautions / Restrictions Precautions Precautions: Fall Precaution Comments: pt impulsive, watch BP and HR Restrictions Weight Bearing Restrictions: No      Mobility Bed Mobility Overal bed mobility: Needs Assistance Bed Mobility: Supine to Sit     Supine to sit: Max assist;HOB elevated     General bed mobility comments: toward R side  Transfers Overall transfer level: Needs assistance Equipment used: Rolling walker (2 wheeled) Transfers: Sit to/from Stand Sit to Stand: +2 physical assistance;Mod assist         General transfer comment: attempted steps to [facility], pt very unsafe    Balance Overall balance assessment: Needs assistance Sitting-balance support: Feet supported Sitting balance-Leahy Scale: Poor Sitting balance - Comments: requiring max to mod assist, slumps over without notice     Standing balance-Leahy Scale: Poor                              ADL Overall ADL's : Needs assistance/impaired     Grooming: Moderate assistance;Bed level   Upper Body Bathing: Maximal assistance;Bed level   Lower Body Bathing: Total assistance;Bed level   Upper Body Dressing : Maximal  assistance;Bed level   Lower Body Dressing: Total assistance;Bed level                       Vision Additional Comments: Unable to participate in vision testing.   Perception Perception Comments:  R side inattention   Praxis      Pertinent Vitals/Pain Pain Assessment: No/denies pain     Hand Dominance  (pt unable to state)   Extremity/Trunk Assessment Upper Extremity Assessment Upper Extremity Assessment: RUE deficits/detail RUE Deficits / Details: R inattention, uses spontaneously with bed positioning and able to lift at shoulder for placement of pillow, not able to grasp/release RUE Sensation: decreased light touch;decreased proprioception (unable to identify cold/warm) RUE Coordination: decreased fine motor;decreased gross motor   Lower Extremity Assessment Lower Extremity Assessment: Defer to PT evaluation       Communication Communication Communication: HOH   Cognition Arousal/Alertness: Awake/alert Behavior During Therapy: Restless;Impulsive Overall Cognitive Status: Impaired/Different from baseline Area of Impairment: Orientation;Attention;Following commands;Safety/judgement Orientation Level: Disoriented to;Time;Situation;Place (knew he was in the hospital when given choices) Current Attention Level: Focused   Following Commands: Follows one step commands inconsistently;Follows one step commands with increased time (with multimodal cues) Safety/Judgement: Decreased awareness of safety;Decreased awareness of deficits         General Comments       Exercises       Shoulder Instructions      Home Living Family/patient expects to be discharged to:: Private residence Living Arrangements: Alone Available Help at Discharge: Friend(s);Available PRN/intermittently Type of Home:  House Home Access: Level entry     Home Layout: One level     Bathroom Shower/Tub: Teacher, early years/pre: Standard     Home Equipment: Shower seat           Prior Functioning/Environment Level of Independence: Independent        Comments: unclear of pt's accuracy    OT Diagnosis: Generalized weakness;Cognitive deficits;Hemiplegia dominant side   OT Problem List: Decreased strength;Decreased activity tolerance;Impaired balance (sitting and/or standing);Impaired vision/perception;Decreased coordination;Decreased cognition;Decreased safety awareness;Decreased knowledge of use of DME or AE;Cardiopulmonary status limiting activity;Impaired sensation;Impaired UE functional use   OT Treatment/Interventions: Self-care/ADL training;DME and/or AE instruction;Therapeutic activities;Cognitive remediation/compensation;Patient/family education;Balance training;Neuromuscular education;Visual/perceptual remediation/compensation    OT Goals(Current goals can be found in the care plan section) Acute Rehab OT Goals OT Goal Formulation: Patient unable to participate in goal setting Time For Goal Achievement: 06/27/14 Potential to Achieve Goals: Fair ADL Goals Pt Will Perform Eating: with set-up;sitting;bed level Pt Will Perform Grooming: with mod assist;sitting;bed level Pt Will Perform Upper Body Bathing: with mod assist;sitting Additional ADL Goal #1: Pt will perform bed mobility with min assist in preparation for ADL at EOB. Additional ADL Goal #2: Pt will sit EOB with min guard assist x 10 minutes as precursor to ADL.  OT Frequency: Min 3X/week   Barriers to D/C: Decreased caregiver support          Co-evaluation PT/OT/SLP Co-Evaluation/Treatment: Yes Reason for Co-Treatment: For patient/therapist safety;Necessary to address cognition/behavior during functional activity   OT goals addressed during session: ADL's and self-care      End of Session Nurse Communication: Mobility status  Activity Tolerance: Patient limited by fatigue Patient left: in bed;with call bell/phone within reach;with bed alarm set   Time: 0349-1791 OT Time  Calculation (min): 21 min Charges:  OT General Charges $OT Visit: 1 Procedure OT Evaluation $Initial OT Evaluation Tier I: 1 Procedure G-Codes:    Malka So 06/13/2014, 11:06 AM  [phone]

## 2014-06-13 NOTE — Evaluation (Addendum)
Physical Therapy Evaluation Patient Details Name: Ian Gonzales MRN: [id number] DOB: February 03, 1941 Today's Date: 06/13/2014   History of Present Illness  Pt admitted with slurred speech and R hemiparesis, awaiting MRI confirmation of CVA.  PMH: Recent dx of malignant pleural effusion, ?lung malignancy, HTN, GERD, smoker.  Clinical Impression  Pt admitted with above diagnosis. Pt currently with functional limitations due to the deficits listed below (see PT Problem List). Pt with significant issues with right hemibody poor coordination affecting postural stability.  Will need NHP.  Will follow acutely.  Pt will benefit from skilled PT to increase their independence and safety with mobility to allow discharge to the venue listed below.     Follow Up Recommendations SNF;Supervision/Assistance - 24 hour    Equipment Recommendations  Other (comment) (TBA)    Recommendations for Other Services       Precautions / Restrictions Precautions Precautions: Fall Precaution Comments: pt impulsive, watch BP and HR Restrictions Weight Bearing Restrictions: No      Mobility  Bed Mobility Overal bed mobility: Needs Assistance Bed Mobility: Supine to Sit     Supine to sit: Max assist;HOB elevated     General bed mobility comments: toward R side  Transfers Overall transfer level: Needs assistance Equipment used: Rolling walker (2 wheeled) Transfers: Sit to/from Stand Sit to Stand: +2 physical assistance;Mod assist         General transfer comment: attempted steps to [facility], pt very unsafe  Ambulation/Gait                Stairs            Wheelchair Mobility    Modified Rankin (Stroke Patients Only)       Balance Overall balance assessment: Needs assistance Sitting-balance support: Bilateral upper extremity supported;Feet supported Sitting balance-Leahy Scale: Poor Sitting balance - Comments: requiring max to mod assist, slumps over to right without notice    Standing balance support: Bilateral upper extremity supported;During functional activity Standing balance-Leahy Scale: Poor Standing balance comment: Pt unable to achieve full stand even with assist.  Stands with flexed posture and needs assist to hold right UE on RW.  Poor coordination of right hemibody. Also with inattention to right side as well.                                Pertinent Vitals/Pain Pain Assessment: No/denies pain  VSS    Home Living Family/patient expects to be discharged to:: Private residence Living Arrangements: Alone Available Help at Discharge: Friend(s);Available PRN/intermittently Type of Home: House Home Access: Level entry     Home Layout: One level Home Equipment: Shower seat      Prior Function Level of Independence: Independent         Comments: unclear of pt's accuracy     Hand Dominance   Dominant Hand:  (pt unable to state)    Extremity/Trunk Assessment   Upper Extremity Assessment: Defer to OT evaluation RUE Deficits / Details: R inattention, uses spontaneously with bed positioning and able to lift at shoulder for placement of pillow, not able to grasp/release   RUE Sensation: decreased light touch;decreased proprioception (unable to identify cold/warm)     Lower Extremity Assessment: RLE deficits/detail RLE Deficits / Details: grossly appears 3+/5       Communication   Communication: HOH  Cognition Arousal/Alertness: Awake/alert Behavior During Therapy: Restless;Impulsive Overall Cognitive Status: Impaired/Different from baseline Area of Impairment: Orientation;Attention;Following commands;Safety/judgement  Orientation Level: Disoriented to;Time;Situation;Place (knew he was in the hospital when given choices) Current Attention Level: Focused   Following Commands: Follows one step commands inconsistently;Follows one step commands with increased time (with multimodal cues) Safety/Judgement: Decreased awareness  of safety;Decreased awareness of deficits          General Comments General comments (skin integrity, edema, etc.): Pt with very poor postural stability overall.  Needs cues and assist for safety.     Exercises        Assessment/Plan    PT Assessment Patient needs continued PT services  PT Diagnosis Generalized weakness   PT Problem List Decreased activity tolerance;Decreased balance;Decreased mobility;Decreased coordination;Decreased knowledge of use of DME;Decreased safety awareness;Decreased cognition;Decreased knowledge of precautions  PT Treatment Interventions DME instruction;Gait training;Functional mobility training;Therapeutic activities;Therapeutic exercise;Balance training;Patient/family education;Cognitive remediation   PT Goals (Current goals can be found in the Care Plan section) Acute Rehab PT Goals Patient Stated Goal: to get better PT Goal Formulation: With patient Time For Goal Achievement: 06/27/14 Potential to Achieve Goals: Good    Frequency Min 3X/week   Barriers to discharge Decreased caregiver support      Co-evaluation PT/OT/SLP Co-Evaluation/Treatment: Yes Reason for Co-Treatment: For patient/therapist safety PT goals addressed during session: Mobility/safety with mobility OT goals addressed during session: ADL's and self-care       End of Session Equipment Utilized During Treatment: Gait belt Activity Tolerance: Patient limited by fatigue Patient left: in bed;with call bell/phone within reach;with bed alarm set Nurse Communication: Mobility status;Need for lift equipment         Time: 9373-4287 PT Time Calculation (min) (ACUTE ONLY): 20 min   Charges:   PT Evaluation $Initial PT Evaluation Tier I: 1 Procedure     PT G CodesDenice Paradise 23-Jun-2014, 11:48 AM [facility] [phone] (pager)
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
## 2014-06-13 NOTE — Progress Notes (Signed)
Speech Language Pathology Treatment: Dysphagia  Patient Details Name: Ian Gonzales MRN: [id number] DOB: 09-08-1940 Today's Date: 06/13/2014 Time: 8185-9093 SLP Time Calculation (min) (ACUTE ONLY): 17 min  Assessment / Plan / Recommendation Clinical Impression  Pt seen just after completing am meal with RN. RN reports pt did not cough with liquids, but had significant anterior spillage and right sided pocketing with solids. He was unable to masticate Dys 2 texture. SLP provided minimal trials of water due to pt complaint of lethargy and need to sleep. Despite this, pt able to accurately position cup and take small sips with min question cue. Recommend pt downgrade to a dys 1 (puree) diet with thin liquids. SLP will f/u for further interventions, hopeful for upgrade when pt maximally alert and able to follow compensatory strategies.    HPI Other Pertinent Information: [name] is a 74 y.o. 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 He did not receive IV t-PA due to delay in arrival. Pt with left CVA, MRI pending.    Pertinent Vitals    SLP Plan  Continue with current plan of care    Recommendations Diet recommendations: Dysphagia 1 (puree);Thin liquid Liquids provided via: Cup Medication Administration: Whole meds with puree Supervision: Intermittent supervision to cue for compensatory strategies Compensations: Small sips/bites;Slow rate;Check for anterior loss;Check for pocketing Postural Changes and/or Swallow Maneuvers: Seated upright 90 degrees              General recommendations: Rehab consult Follow up Recommendations: Inpatient Rehab Plan: Continue with current plan of care    GO    [facility], MA CCC-SLP [phone]  Lynann Beaver 06/13/2014, 9:32 AM

## 2014-06-14 LAB — CBC WITH DIFFERENTIAL/PLATELET
Basophils Absolute: 0 10*3/uL (ref 0.0–0.1)
Basophils Relative: 0 % (ref 0–1)
Eosinophils Absolute: 0.1 10*3/uL (ref 0.0–0.7)
Eosinophils Relative: 1 % (ref 0–5)
HCT: 31.3 % — ABNORMAL LOW (ref 39.0–52.0)
HEMOGLOBIN: 10.6 g/dL — AB (ref 13.0–17.0)
LYMPHS PCT: 11 % — AB (ref 12–46)
Lymphs Abs: 1.8 10*3/uL (ref 0.7–4.0)
MCH: 29.9 pg (ref 26.0–34.0)
MCHC: 33.9 g/dL (ref 30.0–36.0)
MCV: 88.4 fL (ref 78.0–100.0)
MONOS PCT: 13 % — AB (ref 3–12)
Monocytes Absolute: 2.1 10*3/uL — ABNORMAL HIGH (ref 0.1–1.0)
Neutro Abs: 11.8 10*3/uL — ABNORMAL HIGH (ref 1.7–7.7)
Neutrophils Relative %: 75 % (ref 43–77)
PLATELETS: 568 10*3/uL — AB (ref 150–400)
RBC: 3.54 MIL/uL — ABNORMAL LOW (ref 4.22–5.81)
RDW: 13 % (ref 11.5–15.5)
WBC: 15.8 10*3/uL — ABNORMAL HIGH (ref 4.0–10.5)

## 2014-06-14 LAB — COMPREHENSIVE METABOLIC PANEL
ALK PHOS: 76 U/L (ref 38–126)
ALT: 17 U/L (ref 17–63)
AST: 19 U/L (ref 15–41)
Albumin: 2.8 g/dL — ABNORMAL LOW (ref 3.5–5.0)
Anion gap: 14 (ref 5–15)
BUN: 17 mg/dL (ref 6–20)
CO2: 20 mmol/L — ABNORMAL LOW (ref 22–32)
Calcium: 8.9 mg/dL (ref 8.9–10.3)
Chloride: 102 mmol/L (ref 101–111)
Creatinine, Ser: 0.79 mg/dL (ref 0.61–1.24)
GFR calc Af Amer: >60 mL/min (ref >60)
GFR calc non Af Amer: >60 mL/min (ref >60)
Glucose, Bld: 98 mg/dL (ref 70–99)
POTASSIUM: 3.4 mmol/L — AB (ref 3.5–5.1)
Sodium: 136 mmol/L (ref 135–145)
Total Bilirubin: 0.8 mg/dL (ref 0.3–1.2)
Total Protein: 6.6 g/dL (ref 6.5–8.1)

## 2014-06-14 LAB — GLUCOSE, CAPILLARY: Glucose-Capillary: 111 mg/dL — ABNORMAL HIGH (ref 70–99)

## 2014-06-14 MED ORDER — GADOBENATE DIMEGLUMINE 529 MG/ML IV SOLN
15.0000 mL | Freq: Once | INTRAVENOUS | Status: AC | PRN
  Administered 2014-06-13: 15 mL via INTRAVENOUS

## 2014-06-14 NOTE — Progress Notes (Signed)
[facility]  Patient Status: Active (receiving services up to time of hospitalization)  [facility] is providing the following services: RN  If patient discharges after hours, please call [phone].   Ian Gonzales 06/14/2014, 11:49 AM

## 2014-06-14 NOTE — Progress Notes (Signed)
STROKE TEAM PROGRESS NOTE   HISTORY Ian Gonzales is a 74 y.o. male with a history of recently diagnosed suspected lung cancer awaiting formal tissue diagnosis who presents with speech difficulty since about 7pm tonight 06/11/2014. He was at home and felt something was wrong about 7 pm. He had family come by later and they noticed that he was having difficulty with his speech and called 911. He presented to the ED at 2330. Patient was not administered TPA secondary to delay in arrival. He was admitted for further evaluation and treatment.   SUBJECTIVE (INTERVAL HISTORY) His RN is at the bedside .  Overall he feels his condition is unchanged.    OBJECTIVE Temp:  [97.5 F (36.4 C)-99.3 F (37.4 C)] 98.1 F (36.7 C) (05/04 1221) Pulse Rate:  [90-112] 97 (05/04 1221) Cardiac Rhythm:  [-] Normal sinus rhythm (05/04 0800) Resp:  [16-24] 18 (05/04 1221) BP: (115-147)/(66-80) 147/77 mmHg (05/04 1221) SpO2:  [94 %-98 %] 97 % (05/04 1221)   Recent Labs Lab 06/11/14 2351 06/12/14 1208 06/13/14 0815 06/13/14 1257 06/14/14 1220  GLUCAP 130* 106* 124* 100* 111*    Recent Labs Lab 06/07/14 1613 06/09/14 1347 06/11/14 2333 06/11/14 2338 06/12/14 0530 06/14/14 0301  NA 133* 134* 136 138 137 136  K 6.1* 4.4 3.5 3.7 3.5 3.4*  CL 101 101 103 106 105 102  CO2 21 24 19*  --  21* 20*  GLUCOSE 60* 103* 128* 130* 122* 98  BUN 17 21 24* 28* 19 17  CREATININE 0.81 1.22 1.09 0.80 0.77 0.79  CALCIUM 9.2 9.5 8.7*  --  8.7* 8.9    Recent Labs Lab 06/11/14 2333 06/12/14 0530 06/14/14 0301  AST '21 17 19  '$ ALT '25 22 17  '$ ALKPHOS 77 73 76  BILITOT 0.4 0.5 0.8  PROT 6.9 6.8 6.6  ALBUMIN 2.9* 2.7* 2.8*    Recent Labs Lab 06/07/14 1613 06/11/14 2333 06/11/14 2338 06/12/14 0530 06/14/14 0301  WBC 15.4* 17.6*  --  14.0* 15.8*  NEUTROABS 13.0* 13.5*  --  10.5* 11.8*  HGB 11.7* 11.3* 12.2* 10.3* 10.6*  HCT 35.3* 33.6* 36.0* 30.6* 31.3*  MCV 92.3 89.8  --  88.2 88.4  PLT 558.0* 585*   --  564* 568*   No results for input(s): CKTOTAL, CKMB, CKMBINDEX, TROPONINI in the last 168 hours.  Recent Labs  06/11/14 2333 06/12/14 0530  LABPROT 14.7 15.0  INR 1.14 1.16   No results for input(s): COLORURINE, LABSPEC, PHURINE, GLUCOSEU, HGBUR, BILIRUBINUR, KETONESUR, PROTEINUR, UROBILINOGEN, NITRITE, LEUKOCYTESUR in the last 72 hours.  Invalid input(s): APPERANCEUR     Component Value Date/Time   CHOL 153 06/13/2014 1435   TRIG 83 06/13/2014 1435   TRIG 70 02/18/2006 0944   HDL 35* 06/13/2014 1435   CHOLHDL 4.4 06/13/2014 1435   CHOLHDL 5.3 CALC 02/18/2006 0944   VLDL 17 06/13/2014 1435   LDLCALC 101* 06/13/2014 1435   Lab Results  Component Value Date   HGBA1C 6.3* 06/12/2014      Component Value Date/Time   LABOPIA NONE DETECTED 06/12/2014 0208   COCAINSCRNUR NONE DETECTED 06/12/2014 0208   LABBENZ NONE DETECTED 06/12/2014 0208   AMPHETMU NONE DETECTED 06/12/2014 0208   THCU NONE DETECTED 06/12/2014 0208   LABBARB NONE DETECTED 06/12/2014 0208     Recent Labs Lab 06/11/14 2334  ETH <5    Ct Angio Head W/cm &/or Wo Cm  06/12/2014   CLINICAL DATA:  Bilateral carotid artery stenosis. Dominant left brain  infarct with right hemi paresis and hemi sensory loss. He is  EXAM: CT ANGIOGRAPHY HEAD AND NECK  TECHNIQUE: Multidetector CT imaging of the head and neck was performed using the standard protocol during bolus administration of intravenous contrast. Multiplanar CT image reconstructions and MIPs were obtained to evaluate the vascular anatomy. Carotid stenosis measurements (when applicable) are obtained utilizing NASCET criteria, using the distal internal carotid diameter as the denominator.  CONTRAST:  150m OMNIPAQUE IOHEXOL 350 MG/ML SOLN  COMPARISON:  CT head without contrast 06/11/2014.  FINDINGS: CT HEAD  Brain: A a left parietal and possibly posterior left frontal lobe infarct is better visualized with loss of gray-white differentiation and sulcal effacement on  today's study. There is no significant mass effect to generate ventricular effacement or midline shift. Scattered subcortical white matter hypoattenuation is present throughout.  Calvarium and skull base: Negative  Paranasal sinuses: Clear  Orbits: The globes and orbits are intact.  CTA NECK  Aortic arch: Atherosclerotic irregularity calcifications are present within the aortic arch. A 3 vessel configuration is present. Atherosclerotic calcifications are present at the origins. There is mild narrowing of the proximal left common carotid artery without a significant stenosis relative to the more distal vessel.  Right carotid system: The right common carotid artery demonstrates mild atherosclerotic change without a significant stenosis. There is significant calcified and noncalcified plaque at the bifurcation. The lumen of the right internal carotid artery is narrowed to 1.4 mm. This compares with a more normal distal vessel measurement of 4.5 mm. There no tandem stenoses in the neck.  Left carotid system: More prominent atherosclerotic changes are present in the left common carotid artery without a significant stenosis. The left internal carotid artery is occluded 5 mm from the bifurcation. There is minimal contrast in the left ICA at the skullbase. There is no reconstitution in the neck.  Vertebral arteries:Atherosclerotic changes are present at the origins of the vertebral arteries bilaterally. The vessels originate from the subclavian arteries. The left vertebral artery is the dominant vessel. It is tortuous with some calcifications but no significant stenosis. The right vertebral artery is hypoplastic but without focal stenosis.  Skeleton: Heart degenerative changes are present within cervical spine, most evident at C5-6 with uncovertebral spurring and endplate changes. Endplate changes are also noted at C6-7. There is slight degenerative anterolisthesis at C3-4 and C4-5. No focal lytic or blastic lesions are  evident. The patient is edentulous.  Other neck: A soft tissue mass lesion is noted just lateral to the sternocleidomastoid and posterior to the vertical ramus of mandible measuring 4.2 x 1.8 x 2.2 cm. There is a central calcification. Asymmetric soft tissue is present in the right posterolateral hypopharynx.  There is marked irregularity of the pleura compatible with known metastatic disease on the left. A posterior rounded soft tissue density may represent rounded atelectasis versus metastatic disease or loculated fluid. This was not hypermetabolic on the recent PET scan. Paraseptal emphysematous changes are evident.  CTA HEAD  Anterior circulation: Atherosclerotic changes are present within the cavernous right internal carotid artery there is no scratch the the lumen is narrowed but without a significant stenosis relative to the more distal vessel. The terminal right ICA is intact. The left ICA demonstrates some contrast beginning at the skullbase extending through the cavernous segment. The terminal left ICA is small. The A1 and M1 segments are visualized bilaterally. There is some attenuation of the vessels without a focal stenosis. a.m. MCA branch vessels are moderately attenuated bilaterally, more prominently  on the left. The posterior left M2 branches are occluded. Moderate medium and distal ACA branch vessel stenoses are noted.  Posterior circulation: The a left vertebral artery is the dominant vessel. The vertebrobasilar junction is intact. Both posterior cerebral arteries originate from the basilar tip. A PCA branch vessels demonstrate moderate attenuation.  Venous sinuses: The dural sinuses are patent. Straight sinus is patent. The deep veins are patent.  IMPRESSION: 1. Occlusion of the left internal carotid artery with reconstituted flow at the skullbase. 2. 70% stenosis of the right internal carotid artery just beyond the bifurcation. 3. Atherosclerotic changes at the arch without significant proximal  stenosis. 4. Atherosclerotic changes in the cavernous right internal carotid artery with tapering of the vessel but no significant stenosis relative to the more distal vessels. 5. Asymmetric attenuation of MCA branch vessels on the left corresponding with the area of acute/subacute nonhemorrhagic infarction within the left parietal and probably posterior left frontal lobe. 6. Moderate diffuse medium and small vessel disease. 7. 4.2 cm soft tissue mass anterior and superior to the right sternocleidomastoid muscle. This could be related to the tail of the parotid, representing a parotid tumor. Metastatic disease is also considered. 8. Nodular thickening of the pleura in the left chest compatible with known metastatic disease. 9. Loculated fluid versus rounded atelectasis or tumor in the left lung is stable. 10. Asymmetric soft tissue in the posterior right hypopharynx is concerning for tumor. This area should be amenable to evaluation.   Electronically Signed   By: San Morelle M.D.   On: 06/12/2014 16:46   Ct Angio Neck W/cm &/or Wo/cm  06/12/2014   CLINICAL DATA:  Bilateral carotid artery stenosis. Dominant left brain infarct with right hemi paresis and hemi sensory loss. He is  EXAM: CT ANGIOGRAPHY HEAD AND NECK  TECHNIQUE: Multidetector CT imaging of the head and neck was performed using the standard protocol during bolus administration of intravenous contrast. Multiplanar CT image reconstructions and MIPs were obtained to evaluate the vascular anatomy. Carotid stenosis measurements (when applicable) are obtained utilizing NASCET criteria, using the distal internal carotid diameter as the denominator.  CONTRAST:  156m OMNIPAQUE IOHEXOL 350 MG/ML SOLN  COMPARISON:  CT head without contrast 06/11/2014.  FINDINGS: CT HEAD  Brain: A a left parietal and possibly posterior left frontal lobe infarct is better visualized with loss of gray-white differentiation and sulcal effacement on today's study. There is no  significant mass effect to generate ventricular effacement or midline shift. Scattered subcortical white matter hypoattenuation is present throughout.  Calvarium and skull base: Negative  Paranasal sinuses: Clear  Orbits: The globes and orbits are intact.  CTA NECK  Aortic arch: Atherosclerotic irregularity calcifications are present within the aortic arch. A 3 vessel configuration is present. Atherosclerotic calcifications are present at the origins. There is mild narrowing of the proximal left common carotid artery without a significant stenosis relative to the more distal vessel.  Right carotid system: The right common carotid artery demonstrates mild atherosclerotic change without a significant stenosis. There is significant calcified and noncalcified plaque at the bifurcation. The lumen of the right internal carotid artery is narrowed to 1.4 mm. This compares with a more normal distal vessel measurement of 4.5 mm. There no tandem stenoses in the neck.  Left carotid system: More prominent atherosclerotic changes are present in the left common carotid artery without a significant stenosis. The left internal carotid artery is occluded 5 mm from the bifurcation. There is minimal contrast in the left ICA at  the skullbase. There is no reconstitution in the neck.  Vertebral arteries:Atherosclerotic changes are present at the origins of the vertebral arteries bilaterally. The vessels originate from the subclavian arteries. The left vertebral artery is the dominant vessel. It is tortuous with some calcifications but no significant stenosis. The right vertebral artery is hypoplastic but without focal stenosis.  Skeleton: Heart degenerative changes are present within cervical spine, most evident at C5-6 with uncovertebral spurring and endplate changes. Endplate changes are also noted at C6-7. There is slight degenerative anterolisthesis at C3-4 and C4-5. No focal lytic or blastic lesions are evident. The patient is  edentulous.  Other neck: A soft tissue mass lesion is noted just lateral to the sternocleidomastoid and posterior to the vertical ramus of mandible measuring 4.2 x 1.8 x 2.2 cm. There is a central calcification. Asymmetric soft tissue is present in the right posterolateral hypopharynx.  There is marked irregularity of the pleura compatible with known metastatic disease on the left. A posterior rounded soft tissue density may represent rounded atelectasis versus metastatic disease or loculated fluid. This was not hypermetabolic on the recent PET scan. Paraseptal emphysematous changes are evident.  CTA HEAD  Anterior circulation: Atherosclerotic changes are present within the cavernous right internal carotid artery there is no scratch the the lumen is narrowed but without a significant stenosis relative to the more distal vessel. The terminal right ICA is intact. The left ICA demonstrates some contrast beginning at the skullbase extending through the cavernous segment. The terminal left ICA is small. The A1 and M1 segments are visualized bilaterally. There is some attenuation of the vessels without a focal stenosis. a.m. MCA branch vessels are moderately attenuated bilaterally, more prominently on the left. The posterior left M2 branches are occluded. Moderate medium and distal ACA branch vessel stenoses are noted.  Posterior circulation: The a left vertebral artery is the dominant vessel. The vertebrobasilar junction is intact. Both posterior cerebral arteries originate from the basilar tip. A PCA branch vessels demonstrate moderate attenuation.  Venous sinuses: The dural sinuses are patent. Straight sinus is patent. The deep veins are patent.  IMPRESSION: 1. Occlusion of the left internal carotid artery with reconstituted flow at the skullbase. 2. 70% stenosis of the right internal carotid artery just beyond the bifurcation. 3. Atherosclerotic changes at the arch without significant proximal stenosis. 4.  Atherosclerotic changes in the cavernous right internal carotid artery with tapering of the vessel but no significant stenosis relative to the more distal vessels. 5. Asymmetric attenuation of MCA branch vessels on the left corresponding with the area of acute/subacute nonhemorrhagic infarction within the left parietal and probably posterior left frontal lobe. 6. Moderate diffuse medium and small vessel disease. 7. 4.2 cm soft tissue mass anterior and superior to the right sternocleidomastoid muscle. This could be related to the tail of the parotid, representing a parotid tumor. Metastatic disease is also considered. 8. Nodular thickening of the pleura in the left chest compatible with known metastatic disease. 9. Loculated fluid versus rounded atelectasis or tumor in the left lung is stable. 10. Asymmetric soft tissue in the posterior right hypopharynx is concerning for tumor. This area should be amenable to evaluation.   Electronically Signed   By: San Morelle M.D.   On: 06/12/2014 16:46   Mr Jodene Nam Head Wo Contrast  06/13/2014   CLINICAL DATA:  Acute onset of right-sided weakness and speech difficulty. Lung mass with probable lung cancer awaiting biopsy.  EXAM: MRI HEAD WITHOUT CONTRAST  MRA HEAD WITHOUT  CONTRAST  TECHNIQUE: Multiplanar, multiecho pulse sequences of the brain and surrounding structures were obtained without intravenous contrast. Angiographic images of the head were obtained using MRA technique without contrast.  COMPARISON:  CTA head and neck 06/12/2014  FINDINGS: MRI HEAD FINDINGS  Acute infarct left parietal lobe extending down to the posterior insula. This measures approximately 4 x 4 cm. Small area of acute infarct in the head of the caudate on the left. Small area of acute infarct in the right frontal white matter and over the convexity of the right frontal lobe. These areas measure several mm each.  Cerebral volume normal for age. Mild chronic microvascular ischemic change in the  white matter and pons. Chronic micro hemorrhage left pons.  Negative for mass lesion.  No edema.  Postcontrast imaging reveals normal enhancement. No evidence of metastatic disease.  Paranasal sinuses show minimal mucosal edema.  MRA HEAD FINDINGS  Right carotid is widely patent with mild disease in the cavernous carotid. Right anterior and middle cerebral arteries are patent. Mild atherosclerotic disease in right MCA branches without significant stenosis.  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 Left anterior cerebral artery patent. Left middle cerebral artery shows decreased caliber due to decreased flow. Moderate stenosis left M1 segment. Occluded branch to the left parietal lobe corresponding to the acute infarct.  Basilar artery widely patent. Both vertebral arteries are widely patent. Left PICA patent. Right PICA not definitely visualized. Superior cerebellar and posterior cerebral arteries patent bilaterally without stenosis.  Negative for cerebral aneurysm.  IMPRESSION: Acute infarct left parietal lobe. Additional small areas of acute infarct involving the left caudate head, right frontal white matter and right frontal cortex over the convexity.  Negative for metastatic disease to the brain  Occluded left internal carotid artery with reconstitution of the supra clinoid segment. Moderate stenosis left M1 segment and occluded branch to the left parietal lobe.   Electronically Signed   By: Franchot Gallo M.D.   On: 06/13/2014 17:06   Mr Jeri Cos OX Contrast  06/13/2014   CLINICAL DATA:  Acute onset of right-sided weakness and speech difficulty. Lung mass with probable lung cancer awaiting biopsy.  EXAM: MRI HEAD WITHOUT CONTRAST  MRA HEAD WITHOUT CONTRAST  TECHNIQUE: Multiplanar, multiecho pulse sequences of the brain and surrounding structures were obtained without intravenous contrast. Angiographic images of the head were  obtained using MRA technique without contrast.  COMPARISON:  CTA head and neck 06/12/2014  FINDINGS: MRI HEAD FINDINGS  Acute infarct left parietal lobe extending down to the posterior insula. This measures approximately 4 x 4 cm. Small area of acute infarct in the head of the caudate on the left. Small area of acute infarct in the right frontal white matter and over the convexity of the right frontal lobe. These areas measure several mm each.  Cerebral volume normal for age. Mild chronic microvascular ischemic change in the white matter and pons. Chronic micro hemorrhage left pons.  Negative for mass lesion.  No edema.  Postcontrast imaging reveals normal enhancement. No evidence of metastatic disease.  Paranasal sinuses show minimal mucosal edema.  MRA HEAD FINDINGS  Right carotid is widely patent with mild disease in the cavernous carotid. Right anterior and middle cerebral arteries are patent. Mild atherosclerotic disease in right MCA branches without significant stenosis.  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 Left anterior cerebral artery  patent. Left middle cerebral artery shows decreased caliber due to decreased flow. Moderate stenosis left M1 segment. Occluded branch to the left parietal lobe corresponding to the acute infarct.  Basilar artery widely patent. Both vertebral arteries are widely patent. Left PICA patent. Right PICA not definitely visualized. Superior cerebellar and posterior cerebral arteries patent bilaterally without stenosis.  Negative for cerebral aneurysm.  IMPRESSION: Acute infarct left parietal lobe. Additional small areas of acute infarct involving the left caudate head, right frontal white matter and right frontal cortex over the convexity.  Negative for metastatic disease to the brain  Occluded left internal carotid artery with reconstitution of the supra clinoid segment. Moderate stenosis left M1  segment and occluded branch to the left parietal lobe.   Electronically Signed   By: Franchot Gallo M.D.   On: 06/13/2014 17:06     PHYSICAL EXAM Elderly Caucasian male currently not in distress. . Afebrile. Head is nontraumatic. Neck is supple with soft bilateral carotid  bruits.    Cardiac exam no murmur or gallop. Lungs are clear to auscultation. Distal pulses are well felt. Neurological Exam : Awake alert mild expressive language difficulties with dysarthria. Good comprehension and understanding. Follows commands well. Extraocular movements are full range but decreased blink to threat on the right compared to the left. Pupils equal reactive. Fundi were not visualized. Vision acuity seems adequate. Mild right lower facial weakness. Tongue midline. Motor system exam mild right upper and lower extremity drift. Weakness of the right grip and intrinsic hand muscles as well as right hip flexors and ankle dorsiflexors 4/5. Diminished right hemibody sensation. Coordination slow but accurate. Plantars are upgoing. Deep tendon reflexes are 2+ symmetric. Gait was not tested. ASSESSMENT/PLAN Ian Gonzales is a 74 y.o. 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 He did not receive IV t-PA due to delay in arrival.   Stroke:  Dominant left brain infarct, with bilateral extracranial carotid stenosis. Workup underway  Resultant  Dysarthria, right hemiparesis, right hemisensory loss  MRI  With and without contrast pending   Carotid Doppler  R ICA > 80%, L ICA w/ "thumped" waveform c/w distal obstruction  2D Echo   Left ventricle: The cavity size was normal. Wall thickness was increased in a pattern of mild LVH. Systolic function was normal. The estimated ejection fraction was in the range of 55% to 60%. Doppler parameters are consistent  with abnormal left ventricular relaxation (grade 1 diastolic dysfunction).  HgbA1c 6.4  SCDs for VTE prophylaxis DIET - DYS 1 Room service appropriate?: Yes; Fluid consistency:: Thin  aspirin 81 mg orally every day prior to admission, now on aspirin 325 mg orally every day  Ongoing aggressive stroke risk factor management  Therapy recommendations:  SNF  Disposition:  SNF  ICA stenosis  Has bilateral carotid bruits  Carotid Doppler  R ICA > 80%, L ICA w/ "thumped" waveform c/w distal obstruction  CTA head and neck  Hypertension  Home meds:   norvasc  Stable  Hyperlipidemia  Home meds:  No statin   LDL 109, goal < 70  Add statin  Continue statin at discharge  Other Stroke Risk Factors  Advanced age  Cigarette smoker, advised to stop smoking  ETOH use  Other Active Problems  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 I have made any additions or clarifications directly to the above note. Agree with note above. He he presented with right-sided weakness and speech difficulties MRI is pending but exams suggest left MCA branch infarct area did carotid ultrasounds suggest severe rightt carotid stenosis and left carotid occlusion. Continue aspirin and Plavix for 3 months followed by Plavix alone. He is not carotid revascularization candidate at the present time. Revisit the issue after treatment of his cancer and his life expectancy improves. Stroke team will sign off. Kindly call for questions Antony Contras, MD Medical Director [place] Pager: [phone] 06/14/2014 2:26 PM    To contact Stroke Continuity provider, please refer to [email or web address] After hours, contact  General Neurology

## 2014-06-14 NOTE — Clinical Social Work Note (Signed)
Clinical Social Work Assessment  Patient Details  Name: Ian Gonzales  "Ian Gonzales" MRN: [id number] Date of Birth: [birth year]  Date of referral:  06/14/14               Reason for consult:  Facility Placement                Permission sought to share information with:  Case Manager, Customer service manager, Family Supports (Only girlfriend: [name]) Permission granted to share information::  Yes, Verbal Permission Granted  Name::     Girlfriend: [name] (listed on facesheet)  Agency::  New SNF referral  Relationship::  Girlfriend  Contact Information:  Yes [phone]  Housing/Transportation Living arrangements for the past 2 months:  Single Family Home Source of Information:  Patient, Partner Patient Interpreter Needed:  None Criminal Activity/Legal Involvement Pertinent to Current Situation/Hospitalization:  No - Comment as needed Significant Relationships:  Significant Other Lives with:  Self Do you feel safe going back to the place where you live?  No Need for family participation in patient care:  Yes (Comment)  Care giving concerns:  LCSW met with patient with no care giving concerns prior to admission.  Due to stroke, patient is going need SNF for continued treatment.  Patient lives alone with girlfriend coming in to see him daily.   Social Worker assessment / plan:  LCSW received consult that patient needs assistance with decision making, health care POA/Guardianship.  Patient today more alert, oriented to person, place, situation and time.  He struggles with his speech, finding his words and articulating, however able to answer all questions appropriately.  LCSW explained reason for consult, reports he has a brother and sister, but does not want them involved as he has not spoken to them in a long time. Girlfriend called during time of assessment and participated in discussion. Patient wants girlfriend involved in all his care and decision making. Girlfriend agreeable.   Patient and GF also given information about SNF and recommendation for short term rehab as patient needs additional care after hospital.  Patient agreeable to SNF wanting placement in [place] and LCSW will begin referral process.   LCSW to complete [place], FL2 and fax patient out for bed offers.    Employment status:  Retired Health visitor, Managed Care PT Recommendations:  [place] / Referral to community resources:  Acute Rehab, [place]  Patient/Family's Response to care:  Girlfriend was very helpful and agreeable to plan to fax referrals to SNF in [place].  Patient also agreeable to plan as long as it is short term as he owns his own home and does not want to give the home up.  Patient/Family's Understanding of and Emotional Response to Diagnosis, Current Treatment, and Prognosis:  Throughout assessment patient is struggling adjusting to the physical limitations the stroke has caused, specifically speech.  Patient reports he is frustrated, angry, and upset that this Stroke has taken away some of his independence.  LCSW educated patient on coping skills such as deep breathing when he feels overwhelmed with his situation and speech.  Patient told to slow down, take his time, and give himself time to articulate his words and not to rush.  Patient tearful, but cooperative with assessment and plan. Agreeable at this time to SNF.  Girlfriend also part of treatment and helping patient make decisions.  Emotional Assessment Appearance:  Appears stated age Attitude/Demeanor/Rapport:  Other (Cooperative and Appropriate) Affect (typically observed):  Frustrated Orientation:  Oriented  to Self, Oriented to Place, Oriented to  Time, Oriented to Situation Alcohol / Substance use:  Not Applicable Psych involvement (Current and /or in the community):  Yes (Comment) (For capacity: 06/13/14)  Discharge Needs  Concerns to be addressed:  Adjustment to Illness,  Care Coordination, Coping/Stress Concerns Readmission within the last 30 days:  No Current discharge risk:  Lives alone Barriers to Discharge:  Continued Medical Work up   [place]illy Cove, [place] 06/14/2014, 2:45 PM

## 2014-06-14 NOTE — Progress Notes (Signed)
[place] TEAM 1 - Stepdown/ICU TEAM Progress Note  Ian Gonzales PXT:[id number] DOB: 1941/01/28 DOA: 06/11/2014 PCP: Penni Homans, MD  Admit HPI / Brief Narrative: 74 y.o. male with history of hypertension, GERD, recent diagnosis of malignant pleural effusion with possible lung malignancy s/p Pleurx catheter, and active tobacco abuse who presented with the acute onset of slurred speech.  EMS brought the patient to the hospital.  HPI/Subjective: Patient is awake and conversant.  He has some difficulty with word finding but otherwise his speech is intelligible.  He denies nausea vomiting chest pain or shortness of breath.  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 and R frontal with bilateral extracranial carotid stenosis Initial CT scan unrevealing - Neurology following - MRI noted findings above - dysarthria, right hemiparesis, and right hemisensory loss persist - carotid Dopplers revealed greater than 80% right ICA stenosis and evidence of occluded left ICA - to continue aspirin plus Plavix for 3 months followed by Plavix alone   Carotid artery stenosis presently he is not felt to be a candidate for carotid artery revascularization due to his presumed primary lung cancer - reconsideration could be given once more is known about the prognosis of his lung cancer - to follow-up with vascular surgery (Dr. 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 4.2 cm soft tissue  mass anterior and superior to the right sternocleidomastoid muscle - could be related to the tail of the parotid, representing a parotid tumor - metastatic disease is also considered - additional lesion in the posterior right hypopharynx - both appear to be visible on PET 06/08/14 - additional workup not likely indicated if primary lung lesion can be biopsied  Hypertension Blood pressures currently reasonably controlled  Hyperlipidemia LDL not at goal at 100 - Lipitor initiated - follow-up LFTs as outpatient  GERD Continue Pepcid  Tobacco abuse Patient has been counseled on absolute need for smoking cessation  Code Status: FULL Family Communication: no family present at time of exam Disposition Plan: Ultimate discharge plan is for SNF for rehabilitation stay - bed search underway  Consultants: Neurology Vascular surgery  Procedures: 5/2 - bilateral carotid Dopplers - greater than 80% right internal carotid artery stenosis - left internal carotid artery exhibits felt waveform suggestive of possible distal obstruction  Antibiotics: None  DVT prophylaxis: SCDs  Objective: Blood pressure 125/76, pulse 101, temperature 99.1 F (37.3 C), temperature source Oral, resp. rate 27, height '5\' 9"'$  (1.753 m), weight 80.6 kg (177 lb 11.1 oz), SpO2 97 %.  Intake/Output Summary (Last 24 hours) at 06/14/14 1637 Last data filed at 06/14/14 0900  Gross per 24 hour  Intake    240 ml  Output    450 ml  Net   -210 ml   Exam: General: No acute respiratory distress - alert and oriented Lungs: Clear to auscultation bilaterally without wheezes or crackles Cardiovascular: Regular rate and rhythm without murmur gallop or rub normal S1 and S2 Abdomen: Nontender, nondistended, soft, bowel sounds positive,  no rebound, no ascites, no appreciable mass Extremities: No significant cyanosis, clubbing, or edema bilateral lower extremities   Data Reviewed: Basic Metabolic Panel:  Recent Labs Lab  06/09/14 1347 06/11/14 2333 06/11/14 2338 06/12/14 0530 06/14/14 0301  NA 134* 136 138 137 136  K 4.4 3.5 3.7 3.5 3.4*  CL 101 103 106 105 102  CO2 24 19*  --  21* 20*  GLUCOSE 103* 128* 130* 122* 98  BUN 21 24* 28* 19 17  CREATININE 1.22 1.09 0.80 0.77 0.79  CALCIUM 9.5 8.7*  --  8.7* 8.9    CBC:  Recent Labs Lab 06/11/14 2333 06/11/14 2338 06/12/14 0530 06/14/14 0301  WBC 17.6*  --  14.0* 15.8*  NEUTROABS 13.5*  --  10.5* 11.8*  HGB 11.3* 12.2* 10.3* 10.6*  HCT 33.6* 36.0* 30.6* 31.3*  MCV 89.8  --  88.2 88.4  PLT 585*  --  564* 568*    Liver Function Tests:  Recent Labs Lab 06/11/14 2333 06/12/14 0530 06/14/14 0301  AST '21 17 19  '$ ALT '25 22 17  '$ ALKPHOS 77 73 76  BILITOT 0.4 0.5 0.8  PROT 6.9 6.8 6.6  ALBUMIN 2.9* 2.7* 2.8*    Coags:  Recent Labs Lab 06/11/14 2333 06/12/14 0530  INR 1.14 1.16    Recent Labs Lab 06/11/14 2333  APTT 46*   CBG:  Recent Labs Lab 06/11/14 2351 06/12/14 1208 06/13/14 0815 06/13/14 1257 06/14/14 1220  GLUCAP 130* 106* 124* 100* 111*    Recent Results (from the past 240 hour(s))  MRSA PCR Screening     Status: None   Collection Time: 06/12/14  2:37 AM  Result Value Ref Range Status   MRSA by PCR NEGATIVE NEGATIVE Final    Comment:        The GeneXpert MRSA Assay (FDA approved for NASAL specimens only), is one component of a comprehensive MRSA colonization surveillance program. It is not intended to diagnose MRSA infection nor to guide or monitor treatment for MRSA infections.      Studies:   Recent x-ray studies have been reviewed in detail by the Attending Physician  Scheduled Meds:  Scheduled Meds: . aspirin  81 mg Oral Daily  . atorvastatin  20 mg Oral q1800  . clopidogrel  75 mg Oral Daily  . famotidine  20 mg Oral Daily  . nicotine  14 mg Transdermal Daily    Time spent on care of this patient: 35 minutes    MCCLUNG,JEFFREY T , MD   [facility] Office   [phone] Pager - Text Page per Shea Evans as per below:  On-Call/Text Page:      [email or web address]      password [email or web address]  If 7PM-7AM, please contact night-coverage [email or web address] Password [email or web address] 06/14/2014, 4:37 PM   LOS: 2 days

## 2014-06-15 ENCOUNTER — Inpatient Hospital Stay (HOSPITAL_COMMUNITY): Admission: RE | Admit: 2014-06-15 | Payer: Medicare Other | Source: Ambulatory Visit

## 2014-06-15 ENCOUNTER — Encounter (HOSPITAL_COMMUNITY): Payer: Self-pay | Admitting: Internal Medicine

## 2014-06-15 ENCOUNTER — Inpatient Hospital Stay (HOSPITAL_COMMUNITY): Payer: Medicare Other

## 2014-06-15 DIAGNOSIS — R222 Localized swelling, mass and lump, trunk: Secondary | ICD-10-CM

## 2014-06-15 DIAGNOSIS — D379 Neoplasm of uncertain behavior of digestive organ, unspecified: Secondary | ICD-10-CM

## 2014-06-15 DIAGNOSIS — C3490 Malignant neoplasm of unspecified part of unspecified bronchus or lung: Secondary | ICD-10-CM | POA: Diagnosis present

## 2014-06-15 DIAGNOSIS — R410 Disorientation, unspecified: Secondary | ICD-10-CM

## 2014-06-15 DIAGNOSIS — G9340 Encephalopathy, unspecified: Secondary | ICD-10-CM | POA: Diagnosis present

## 2014-06-15 DIAGNOSIS — D490 Neoplasm of unspecified behavior of digestive system: Secondary | ICD-10-CM

## 2014-06-15 LAB — PROTIME-INR
INR: 1.22 (ref 0.00–1.49)
PROTHROMBIN TIME: 15.6 s — AB (ref 11.6–15.2)

## 2014-06-15 LAB — CBC
HCT: 32.6 % — ABNORMAL LOW (ref 39.0–52.0)
Hemoglobin: 11 g/dL — ABNORMAL LOW (ref 13.0–17.0)
MCH: 29.8 pg (ref 26.0–34.0)
MCHC: 33.7 g/dL (ref 30.0–36.0)
MCV: 88.3 fL (ref 78.0–100.0)
PLATELETS: 548 10*3/uL — AB (ref 150–400)
RBC: 3.69 MIL/uL — AB (ref 4.22–5.81)
RDW: 12.8 % (ref 11.5–15.5)
WBC: 16.1 10*3/uL — ABNORMAL HIGH (ref 4.0–10.5)

## 2014-06-15 LAB — COMPREHENSIVE METABOLIC PANEL
ALT: 17 U/L (ref 17–63)
AST: 17 U/L (ref 15–41)
Albumin: 2.9 g/dL — ABNORMAL LOW (ref 3.5–5.0)
Alkaline Phosphatase: 83 U/L (ref 38–126)
Anion gap: 12 (ref 5–15)
BILIRUBIN TOTAL: 0.6 mg/dL (ref 0.3–1.2)
BUN: 19 mg/dL (ref 6–20)
CHLORIDE: 103 mmol/L (ref 101–111)
CO2: 21 mmol/L — ABNORMAL LOW (ref 22–32)
Calcium: 8.9 mg/dL (ref 8.9–10.3)
Creatinine, Ser: 0.8 mg/dL (ref 0.61–1.24)
GFR calc Af Amer: >60 mL/min (ref >60)
GFR calc non Af Amer: >60 mL/min (ref >60)
Glucose, Bld: 114 mg/dL — ABNORMAL HIGH (ref 70–99)
Potassium: 3.5 mmol/L (ref 3.5–5.1)
Sodium: 136 mmol/L (ref 135–145)
TOTAL PROTEIN: 6.7 g/dL (ref 6.5–8.1)

## 2014-06-15 LAB — MAGNESIUM: MAGNESIUM: 1.9 mg/dL (ref 1.7–2.4)

## 2014-06-15 MED ORDER — MIDAZOLAM HCL 2 MG/2ML IJ SOLN
INTRAMUSCULAR | Status: AC | PRN
  Administered 2014-06-15: 0.5 mg via INTRAVENOUS

## 2014-06-15 MED ORDER — FLUCONAZOLE IN SODIUM CHLORIDE 200-0.9 MG/100ML-% IV SOLN
200.0000 mg | Freq: Once | INTRAVENOUS | Status: AC
  Administered 2014-06-15: 200 mg via INTRAVENOUS
  Filled 2014-06-15: qty 100

## 2014-06-15 MED ORDER — MIDAZOLAM HCL 2 MG/2ML IJ SOLN
INTRAMUSCULAR | Status: AC
  Filled 2014-06-15: qty 2

## 2014-06-15 MED ORDER — FENTANYL CITRATE (PF) 100 MCG/2ML IJ SOLN
INTRAMUSCULAR | Status: AC
  Filled 2014-06-15: qty 2

## 2014-06-15 MED ORDER — FENTANYL CITRATE (PF) 100 MCG/2ML IJ SOLN
INTRAMUSCULAR | Status: AC | PRN
  Administered 2014-06-15: 25 ug via INTRAVENOUS

## 2014-06-15 MED ORDER — FLUCONAZOLE 100MG IVPB
100.0000 mg | INTRAVENOUS | Status: DC
  Administered 2014-06-16: 100 mg via INTRAVENOUS
  Filled 2014-06-15: qty 50

## 2014-06-15 NOTE — Clinical Social Work Placement (Signed)
   CLINICAL SOCIAL WORK PLACEMENT  NOTE  Date:  06/15/2014  Patient Details  Name: Ian Gonzales MRN: [id number] Date of Birth: 12-11-1940  Clinical Social Work is seeking post-discharge placement for this patient at the [place] level of care (*CSW will initial, date and re-position this form in  chart as items are completed):  Yes   Patient/family provided with [facility] Work Department's list of facilities offering this level of care within the geographic area requested by the patient (or if unable, by the patient's family).  Yes   Patient/family informed of their freedom to choose among providers that offer the needed level of care, that participate in Medicare, Medicaid or managed care program needed by the patient, have an available bed and are willing to accept the patient.  Yes   Patient/family informed of [facility]'s ownership interest in [facility] and [facility], as well as of the fact that they are under no obligation to receive care at these facilities.  PASRR submitted to EDS on 06/15/14     PASRR number received on 06/15/14     Existing PASRR number confirmed on       FL2 transmitted to all facilities in geographic area requested by pt/family on 06/15/14     FL2 transmitted to all facilities within larger geographic area on       Patient informed that his/her managed care company has contracts with or will negotiate with certain facilities, including the following:            Patient/family informed of bed offers received.  Patient chooses bed at       Physician recommends and patient chooses bed at      Patient to be transferred to   on  .  Patient to be transferred to facility by       Patient family notified on   of transfer.  Name of family member notified:        PHYSICIAN       Additional Comment:    _______________________________________________ Lilly Cove, LCSW 06/15/2014, 9:01 AM

## 2014-06-15 NOTE — Progress Notes (Signed)
Able to drain 50 cc of serosanguinous drain  from his Left pleurex catheter, pt tolerated the procedure very well, dressing applied.

## 2014-06-15 NOTE — Progress Notes (Signed)
PT Cancellation Note  Patient Details Name: Ian Gonzales MRN: [id number] DOB: 11/18/1940   Cancelled Treatment:    Reason Eval/Treat Not Completed: Patient at procedure or test/unavailable.  Patient at procedure.  Will return at later time for PT session.   Despina Pole 06/15/2014, 3:43 PM Carita Pian. Sanjuana Kava, [place] Pager [phone]

## 2014-06-15 NOTE — Progress Notes (Signed)
Occupational Therapy Treatment Patient Details Name: Ian Gonzales MRN: [id number] DOB: Aug 04, 1940 Today's Date: 06/15/2014    History of present illness Pt admitted with slurred speech and R hemiparesis, awaiting MRI confirmation of CVA.  PMH: Recent dx of malignant pleural effusion, ?lung malignancy, HTN, GERD, smoker.   OT comments  Pt improving in all areas of mobility and R UE strength with emerging functional use of R UE.  Remains highly impulsive and unsafe with decreased R side sensation and inattention. 02 sats remained 93-100% on RA with activity, HR to 101. Pt is eager to get out of hospital and go to rehab.  Follow Up Recommendations  SNF;Supervision/Assistance - 24 hour    Equipment Recommendations       Recommendations for Other Services      Precautions / Restrictions Precautions Precautions: Fall Precaution Comments: pt impulsive, watch BP and HR       Mobility Bed Mobility   Bed Mobility: Supine to Sit;Sit to Supine     Supine to sit: Min guard Sit to supine: Min guard   General bed mobility comments: toward L side  Transfers Overall transfer level: Needs assistance Equipment used: 1 person hand held assist Transfers: Sit to/from Stand Sit to Stand: Mod assist         General transfer comment: performed sit <> stand from EOB x 2 with R knee blocked, pt unsafe, poor control of descent    Balance Overall balance assessment: Needs assistance Sitting-balance support: Feet supported Sitting balance-Leahy Scale: Fair Sitting balance - Comments: no physical assist, primarily for safety     Standing balance-Leahy Scale: Poor                     ADL Overall ADL's : Needs assistance/impaired     Grooming: Minimal assistance;Oral care;Brushing hair;Sitting                                 General ADL Comments: Performed reaching with R UE for various targets. ADL items.      Vision                      Perception     Praxis      Cognition   Behavior During Therapy: Impulsive Overall Cognitive Status: Impaired/Different from baseline Area of Impairment: Orientation;Attention;Following commands;Safety/judgement Orientation Level: Disoriented to;Time;Situation;Place Current Attention Level: Sustained Memory: Decreased short-term memory  Following Commands: Follows one step commands inconsistently;Follows one step commands with increased time Safety/Judgement: Decreased awareness of safety;Decreased awareness of deficits          Extremity/Trunk Assessment               Exercises General Exercises - Upper Extremity Shoulder Flexion: AROM;Right;10 reps;Supine Elbow Flexion: AROM;Right;10 reps;Supine Elbow Extension: AROM;Right;10 reps;Supine Digit Composite Flexion: AROM;Right;10 reps;Supine Composite Extension: AROM;Right;10 reps;Supine   Shoulder Instructions       General Comments      Pertinent Vitals/ Pain       Pain Assessment: No/denies pain  Home Living                                          Prior Functioning/Environment              Frequency Min 3X/week     Progress Toward Goals  OT Goals(current goals can now be found in the care plan section)  Progress towards OT goals: Progressing toward goals  Acute Rehab OT Goals Patient Stated Goal: to get better Time For Goal Achievement: 06/27/14  Plan Discharge plan remains appropriate    Co-evaluation                 End of Session     Activity Tolerance Patient limited by fatigue   Patient Left in bed;with call bell/phone within reach;with bed alarm set   Nurse Communication          Time: 5277-8242 OT Time Calculation (min): 28 min  Charges: OT General Charges $OT Visit: 1 Procedure OT Treatments $Self Care/Home Management : 8-22 mins $Neuromuscular Re-education: 8-22 mins  Malka So 06/15/2014, 9:17 AM  [phone]

## 2014-06-15 NOTE — Procedures (Signed)
CT guided core biopsies of pleural/intercostal tissue in the left lower chest.  No immediate complication.  Minimal blood loss.

## 2014-06-15 NOTE — Progress Notes (Signed)
[place] TEAM 1 - Stepdown/ICU TEAM Progress Note  Ian Gonzales WIO:[id number] DOB: [birth year] DOA: 06/11/2014 PCP: Penni Homans, MD  Admit HPI / Brief Narrative: 74 y.o. WM PMHx memory loss, hypertension, HLD, spinal stenosis lumbar, GERD, recent diagnosis of lung mass, (cytology pending), malignant pleural effusion s/p Pleurx catheter,active tobacco abuse Presented with the acute onset of slurred speech. EMS brought the patient to the hospital.   HPI/Subjective: 5/4  A/O 3 (does not know where, ). No acute distress much more appropriate with answers today   Assessment/Plan: Acute left brain CVA -Initial CT scan unrevealing  -Neurology following  - MRI pending;   Right Carotid artery stenosis -Right internal carotid 70% stenosis; consult vascular surgery  Hypopharynx tumor/Parotid tumor -Consult ENT consider biopsy  Oral pharyngeal candidiasis -Diflucan IV 200 mg 1; then 100 mg daily   Left Malignant pleural effusion - suspected primary lung cancer -4/20 pleural fluid nondiagnostic - Spoke off line with PCCM and they agree that considering patient's acute illness bronchoscopy would not be appropriate.  Hypertension -Would allow permissive HTN secondary to CVA  Hyperlipidemia -Lipid panel pending -Start Lipitor 20 mg daily  GERD  Tobacco abuse -Continue Nicoderm Patch  Social issues -Currently patient is not competent to make his own medical decisions and does not appear that patient has family/friends to make his decisions. -Discuss making patient wore of the state until he becomes competent. -Psychiatry consult pending    Code Status: FULL Family Communication: no family present at time of exam Disposition Plan:    Consultants: Dr.McNeill Adrian Prows (neurology)    Procedure/Significant Events: 4/20 left pleural fluid; unable to make definitive diagnosis regarding malignancy 5/2 CT angiogram head;. Occlusion Lt internal carotid artery with  reconstituted flow at the skullbase. - 70% stenosis Rt internal carotid artery  -. Asymmetric attenuation of MCA branch vessels on the left corresponding with the area of acute/subacute nonhemorrhagic infarction within the left parietal and probably posterior left frontal lobe. - 4.2 cm soft tissue mass anterior and superior to the right sternocleidomastoid muscle. Parotid  Tumor? vs  Metastatic disease -Nodular thickening  pleura in the left chest compatible with known metastatic disease. -Loculated fluid versus rounded atelectasis or tumor in the left lung is stable. - Asymmetric soft tissue in the posterior right hypopharynx Tumor? This area should be amenable to evaluation. 5/2 - bilateral carotid Dopplers - greater than 80% right internal carotid artery stenosis - left internal carotid artery exhibits felt waveform suggestive of possible distal obstruction 5/3 MRI/MRA brain:Acute infarct left parietal lobe. -Additional small areas of acute infarct involving the left caudate head, right frontal white matter and right frontal cortex over the convexity. -Negative for metastatic disease to the brain -Occluded left internal carotid artery with reconstitution of supra clinoid segment.  -Moderate stenosis left M1 segment and occluded branch to the left parietal lobe.  Culture NA  Antibiotics: NA  DVT prophylaxis: SCD   Devices    LINES / TUBES:      Continuous Infusions:   Objective: VITAL SIGNS: Temp: 98.6 F (37 C) (05/05 1237) Temp Source: Oral (05/05 1237) BP: 120/69 mmHg (05/05 1514) Pulse Rate: 94 (05/05 1514) SPO2; FIO2:   Intake/Output Summary (Last 24 hours) at 06/15/14 1553 Last data filed at 06/15/14 0600  Gross per 24 hour  Intake      0 ml  Output    600 ml  Net   -600 ml     Exam: General:  A/O 3 (does not know  where, )., No acute respiratory distress Lungs: right lung fields clear to auscultation, LUL some air movement but decreased, lingula/LLL  poor to no air movement appreciated on auscultation, negative wheezes/crackles  Cardiovascular: Regular rhythm and rate without murmur gallop or rub normal S1 and S2 Abdomen: Nontender, nondistended, soft, bowel sounds positive, no rebound, no ascites, no appreciable mass Extremities: No significant cyanosis, clubbing, or edema bilateral lower extremities Neurologic; cranial nerves II through XII intact, tongue/uvula midline, pupils equal round reactive to light and accommodation, positive left facial droop, positive dysarthria, right upper extremity strength 4/5, right lower extremity strength 4/5, left upper extremity/left lower extremity strength 5/5, did not ambulate patient.  Data Reviewed: Basic Metabolic Panel:  Recent Labs Lab 06/09/14 1347 06/11/14 2333 06/11/14 2338 06/12/14 0530 06/14/14 0301 06/15/14 0822  NA 134* 136 138 137 136 136  K 4.4 3.5 3.7 3.5 3.4* 3.5  CL 101 103 106 105 102 103  CO2 24 19*  --  21* 20* 21*  GLUCOSE 103* 128* 130* 122* 98 114*  BUN 21 24* 28* '19 17 19  '$ CREATININE 1.22 1.09 0.80 0.77 0.79 0.80  CALCIUM 9.5 8.7*  --  8.7* 8.9 8.9  MG  --   --   --   --   --  1.9   Liver Function Tests:  Recent Labs Lab 06/11/14 2333 06/12/14 0530 06/14/14 0301 06/15/14 0822  AST '21 17 19 17  '$ ALT '25 22 17 17  '$ ALKPHOS 77 73 76 83  BILITOT 0.4 0.5 0.8 0.6  PROT 6.9 6.8 6.6 6.7  ALBUMIN 2.9* 2.7* 2.8* 2.9*   No results for input(s): LIPASE, AMYLASE in the last 168 hours. No results for input(s): AMMONIA in the last 168 hours. CBC:  Recent Labs Lab 06/11/14 2333 06/11/14 2338 06/12/14 0530 06/14/14 0301 06/15/14 0822  WBC 17.6*  --  14.0* 15.8* 16.1*  NEUTROABS 13.5*  --  10.5* 11.8*  --   HGB 11.3* 12.2* 10.3* 10.6* 11.0*  HCT 33.6* 36.0* 30.6* 31.3* 32.6*  MCV 89.8  --  88.2 88.4 88.3  PLT 585*  --  564* 568* 548*   Cardiac Enzymes: No results for input(s): CKTOTAL, CKMB, CKMBINDEX, TROPONINI in the last 168 hours. BNP (last 3  results)  Recent Labs  05/30/14 1630  BNP 25.2    ProBNP (last 3 results) No results for input(s): PROBNP in the last 8760 hours.  CBG:  Recent Labs Lab 06/11/14 2351 06/12/14 1208 06/13/14 0815 06/13/14 1257 06/14/14 1220  GLUCAP 130* 106* 124* 100* 111*    Recent Results (from the past 240 hour(s))  MRSA PCR Screening     Status: None   Collection Time: 06/12/14  2:37 AM  Result Value Ref Range Status   MRSA by PCR NEGATIVE NEGATIVE Final    Comment:        The GeneXpert MRSA Assay (FDA approved for NASAL specimens only), is one component of a comprehensive MRSA colonization surveillance program. It is not intended to diagnose MRSA infection nor to guide or monitor treatment for MRSA infections.      Studies:  Recent x-ray studies have been reviewed in detail by the Attending Physician  Scheduled Meds:  Scheduled Meds: . aspirin  81 mg Oral Daily  . atorvastatin  20 mg Oral q1800  . clopidogrel  75 mg Oral Daily  . famotidine  20 mg Oral Daily  . fentaNYL      . [START ON 06/16/2014] fluconazole (DIFLUCAN) IV  100 mg  Intravenous Q24H  . midazolam      . nicotine  14 mg Transdermal Daily    Time spent on care of this patient: 40 mins   WOODS, Geraldo Docker , MD  [facility] Office  [phone] Pager - [phone]  On-Call/Text Page:      [email or web address]      password [email or web address]  If 7PM-7AM, please contact night-coverage [email or web address] Password [email or web address] 06/15/2014, 3:53 PM   LOS: 3 days   Care during the described time interval was provided by me .  I have reviewed this patient's available data, including medical history, events of note, physical examination, radiology studies and test results as part of my evaluation  Dia Crawford, MD [phone] Pager

## 2014-06-15 NOTE — Progress Notes (Signed)
SLP Cancellation Note  Patient Details Name: Ian Gonzales MRN: [id number] DOB: May 15, 1940   Cancelled treatment:       Reason Eval/Treat Not Completed: Patient at procedure or test/unavailable (CT-guided left pleural nodularity biopsy).  Will follow-up next date.   Rebekkah Powless L. Tivis Ringer, [place] CCC/SLP Pager [phone]  Ian Gonzales 06/15/2014, 2:18 PM

## 2014-06-15 NOTE — Progress Notes (Signed)
Patient ID: Ian Gonzales, male   DOB: 02/14/1940, 74 y.o.   MRN: [id number]    Referring Physician(s): [facility]  Subjective: Pt familiar  to IR service from prior left thoracentesis on 05/12/14. He is status post left Pleurx catheter placement by Dr. 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 Patient was admitted on 06/12/14 with slurred speech, right-sided weakness and imaging revealing left parietal CVA with additional small areas of acute infarct involving the left caudate head, right frontal white matter right frontal cortex. He is currently on Plavix and aspirin therapy. 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 In addition he was noted to have a 4.2 cm soft tissue mass anterior superior to the right sternocleidomastoid muscle, nodular thickening of the left pleura, and asymmetric soft tissue in the posterior right hypopharynx concerning for tumor and hypermetabolic on recent PET scan. Request has now been made for CT-guided biopsy the left pleural nodularity to confirm malignancy.   Allergies: Ibuprofen; Gabapentin; and Valacyclovir  Medications: Prior to Admission medications   Medication Sig Start Date End Date Taking? Authorizing Provider  acetaminophen (TYLENOL) 325 MG tablet Take 650 mg by mouth every morning.   Yes Historical Provider, MD  amLODipine (NORVASC) 5 MG tablet Take 1 tablet (5 mg total) by mouth daily. for high blood pressure 05/31/14  Yes Barton Dubois, MD  aspirin 81 MG tablet Take 1 tablet (81 mg total) by mouth daily. Hold until 06/01/14; due to pleurex catheter placement; then resume therapy as previously indictaed 05/31/14  Yes Barton Dubois, MD  naproxen sodium (ALEVE) 220 MG tablet Take 2 tablets (440 mg total) by mouth every morning. Hold until 06/01/14; due to pleurex catheter placement; then resume therapy as  previously indictaed 05/31/14  Yes Barton Dubois, MD  ranitidine (ZANTAC) 300 MG tablet take 1 tablet by mouth at bedtime 03/15/14  Yes Mosie Lukes, MD  nicotine (NICODERM CQ) 14 mg/24hr patch Place 1 patch (14 mg total) onto the skin daily. Patient not taking: Reported on 06/01/2014 05/31/14   Barton Dubois, MD  sodium polystyrene (KAYEXALATE) 15 GM/60ML suspension Take 60 mLs (15 g total) by mouth once. Patient not taking: Reported on 06/09/2014 06/08/14   Mosie Lukes, MD  traMADol (ULTRAM) 50 MG tablet Take 1 tablet (50 mg total) by mouth every 8 (eight) hours as needed for severe pain. Patient not taking: Reported on 06/12/2014 05/31/14   Barton Dubois, MD     Vital Signs: BP 128/67 mmHg  Pulse 89  Temp(Src) 98.8 F (37.1 C) (Oral)  Resp 21  Ht '5\' 9"'$  (1.753 m)  Wt 177 lb 11.1 oz (80.6 kg)  BMI 26.23 kg/m2  SpO2 96%  Physical Exam patient awake, alert, speech slightly slurred, mild exp aphasia; chest with slightly diminished breath sounds left base, right clear; intact left Pleurx catheter, Heart regular rate and rhythm. Abdomen soft, positive bowel sounds, mild epigastric discomfort to palpation; extremities with no significant edema, sl RUE weakness  Imaging: Ct Angio Head W/cm &/or Wo Cm  06/12/2014   CLINICAL DATA:  Bilateral carotid artery stenosis. Dominant left brain infarct with right hemi paresis and hemi sensory loss. He is  EXAM: CT ANGIOGRAPHY HEAD AND NECK  TECHNIQUE: Multidetector CT imaging of the head and neck was performed using the standard protocol during bolus administration of intravenous contrast. Multiplanar CT image reconstructions and MIPs were obtained to  evaluate the vascular anatomy. Carotid stenosis measurements (when applicable) are obtained utilizing NASCET criteria, using the distal internal carotid diameter as the denominator.  CONTRAST:  162m OMNIPAQUE IOHEXOL 350 MG/ML SOLN  COMPARISON:  CT head without contrast 06/11/2014.  FINDINGS: CT HEAD  Brain: A a  left parietal and possibly posterior left frontal lobe infarct is better visualized with loss of gray-white differentiation and sulcal effacement on today's study. There is no significant mass effect to generate ventricular effacement or midline shift. Scattered subcortical white matter hypoattenuation is present throughout.  Calvarium and skull base: Negative  Paranasal sinuses: Clear  Orbits: The globes and orbits are intact.  CTA NECK  Aortic arch: Atherosclerotic irregularity calcifications are present within the aortic arch. A 3 vessel configuration is present. Atherosclerotic calcifications are present at the origins. There is mild narrowing of the proximal left common carotid artery without a significant stenosis relative to the more distal vessel.  Right carotid system: The right common carotid artery demonstrates mild atherosclerotic change without a significant stenosis. There is significant calcified and noncalcified plaque at the bifurcation. The lumen of the right internal carotid artery is narrowed to 1.4 mm. This compares with a more normal distal vessel measurement of 4.5 mm. There no tandem stenoses in the neck.  Left carotid system: More prominent atherosclerotic changes are present in the left common carotid artery without a significant stenosis. The left internal carotid artery is occluded 5 mm from the bifurcation. There is minimal contrast in the left ICA at the skullbase. There is no reconstitution in the neck.  Vertebral arteries:Atherosclerotic changes are present at the origins of the vertebral arteries bilaterally. The vessels originate from the subclavian arteries. The left vertebral artery is the dominant vessel. It is tortuous with some calcifications but no significant stenosis. The right vertebral artery is hypoplastic but without focal stenosis.  Skeleton: Heart degenerative changes are present within cervical spine, most evident at C5-6 with uncovertebral spurring and endplate  changes. Endplate changes are also noted at C6-7. There is slight degenerative anterolisthesis at C3-4 and C4-5. No focal lytic or blastic lesions are evident. The patient is edentulous.  Other neck: A soft tissue mass lesion is noted just lateral to the sternocleidomastoid and posterior to the vertical ramus of mandible measuring 4.2 x 1.8 x 2.2 cm. There is a central calcification. Asymmetric soft tissue is present in the right posterolateral hypopharynx.  There is marked irregularity of the pleura compatible with known metastatic disease on the left. A posterior rounded soft tissue density may represent rounded atelectasis versus metastatic disease or loculated fluid. This was not hypermetabolic on the recent PET scan. Paraseptal emphysematous changes are evident.  CTA HEAD  Anterior circulation: Atherosclerotic changes are present within the cavernous right internal carotid artery there is no scratch the the lumen is narrowed but without a significant stenosis relative to the more distal vessel. The terminal right ICA is intact. The left ICA demonstrates some contrast beginning at the skullbase extending through the cavernous segment. The terminal left ICA is small. The A1 and M1 segments are visualized bilaterally. There is some attenuation of the vessels without a focal stenosis. a.m. MCA branch vessels are moderately attenuated bilaterally, more prominently on the left. The posterior left M2 branches are occluded. Moderate medium and distal ACA branch vessel stenoses are noted.  Posterior circulation: The a left vertebral artery is the dominant vessel. The vertebrobasilar junction is intact. Both posterior cerebral arteries originate from the basilar tip. A PCA branch  vessels demonstrate moderate attenuation.  Venous sinuses: The dural sinuses are patent. Straight sinus is patent. The deep veins are patent.  IMPRESSION: 1. Occlusion of the left internal carotid artery with reconstituted flow at the skullbase.  2. 70% stenosis of the right internal carotid artery just beyond the bifurcation. 3. Atherosclerotic changes at the arch without significant proximal stenosis. 4. Atherosclerotic changes in the cavernous right internal carotid artery with tapering of the vessel but no significant stenosis relative to the more distal vessels. 5. Asymmetric attenuation of MCA branch vessels on the left corresponding with the area of acute/subacute nonhemorrhagic infarction within the left parietal and probably posterior left frontal lobe. 6. Moderate diffuse medium and small vessel disease. 7. 4.2 cm soft tissue mass anterior and superior to the right sternocleidomastoid muscle. This could be related to the tail of the parotid, representing a parotid tumor. Metastatic disease is also considered. 8. Nodular thickening of the pleura in the left chest compatible with known metastatic disease. 9. Loculated fluid versus rounded atelectasis or tumor in the left lung is stable. 10. Asymmetric soft tissue in the posterior right hypopharynx is concerning for tumor. This area should be amenable to evaluation.   Electronically Signed   By: San Morelle M.D.   On: 06/12/2014 16:46   Ct Head (brain) Wo Contrast  06/11/2014   CLINICAL DATA:  Right-sided weakness, acute onset  EXAM: CT HEAD WITHOUT CONTRAST  TECHNIQUE: Contiguous axial images were obtained from the base of the skull through the vertex without intravenous contrast.  COMPARISON:  PET-CT scan 428 1,016, head CT 05/04/2014  FINDINGS: No acute intracranial hemorrhage. No focal mass lesion. No CT evidence of acute infarction. No midline shift or mass effect. No hydrocephalus. Basilar cisterns are patent. Mild subcortical white matter hypodensities. Paranasal sinuses and mastoid air cells are clear.  IMPRESSION: 1. No CT evidence of acute infarction. 2. No intracranial hemorrhage. 3. Scattered subcortical white matter hypodensities consistent with microvascular disease. Findings  conveyed toKirkpatrick, MDon 06/11/2014  at23:54.   Electronically Signed   By: Suzy Bouchard M.D.   On: 06/11/2014 23:55   Ct Angio Neck W/cm &/or Wo/cm  06/12/2014   CLINICAL DATA:  Bilateral carotid artery stenosis. Dominant left brain infarct with right hemi paresis and hemi sensory loss. He is  EXAM: CT ANGIOGRAPHY HEAD AND NECK  TECHNIQUE: Multidetector CT imaging of the head and neck was performed using the standard protocol during bolus administration of intravenous contrast. Multiplanar CT image reconstructions and MIPs were obtained to evaluate the vascular anatomy. Carotid stenosis measurements (when applicable) are obtained utilizing NASCET criteria, using the distal internal carotid diameter as the denominator.  CONTRAST:  167m OMNIPAQUE IOHEXOL 350 MG/ML SOLN  COMPARISON:  CT head without contrast 06/11/2014.  FINDINGS: CT HEAD  Brain: A a left parietal and possibly posterior left frontal lobe infarct is better visualized with loss of gray-white differentiation and sulcal effacement on today's study. There is no significant mass effect to generate ventricular effacement or midline shift. Scattered subcortical white matter hypoattenuation is present throughout.  Calvarium and skull base: Negative  Paranasal sinuses: Clear  Orbits: The globes and orbits are intact.  CTA NECK  Aortic arch: Atherosclerotic irregularity calcifications are present within the aortic arch. A 3 vessel configuration is present. Atherosclerotic calcifications are present at the origins. There is mild narrowing of the proximal left common carotid artery without a significant stenosis relative to the more distal vessel.  Right carotid system: The right common carotid artery  demonstrates mild atherosclerotic change without a significant stenosis. There is significant calcified and noncalcified plaque at the bifurcation. The lumen of the right internal carotid artery is narrowed to 1.4 mm. This compares with a more normal distal  vessel measurement of 4.5 mm. There no tandem stenoses in the neck.  Left carotid system: More prominent atherosclerotic changes are present in the left common carotid artery without a significant stenosis. The left internal carotid artery is occluded 5 mm from the bifurcation. There is minimal contrast in the left ICA at the skullbase. There is no reconstitution in the neck.  Vertebral arteries:Atherosclerotic changes are present at the origins of the vertebral arteries bilaterally. The vessels originate from the subclavian arteries. The left vertebral artery is the dominant vessel. It is tortuous with some calcifications but no significant stenosis. The right vertebral artery is hypoplastic but without focal stenosis.  Skeleton: Heart degenerative changes are present within cervical spine, most evident at C5-6 with uncovertebral spurring and endplate changes. Endplate changes are also noted at C6-7. There is slight degenerative anterolisthesis at C3-4 and C4-5. No focal lytic or blastic lesions are evident. The patient is edentulous.  Other neck: A soft tissue mass lesion is noted just lateral to the sternocleidomastoid and posterior to the vertical ramus of mandible measuring 4.2 x 1.8 x 2.2 cm. There is a central calcification. Asymmetric soft tissue is present in the right posterolateral hypopharynx.  There is marked irregularity of the pleura compatible with known metastatic disease on the left. A posterior rounded soft tissue density may represent rounded atelectasis versus metastatic disease or loculated fluid. This was not hypermetabolic on the recent PET scan. Paraseptal emphysematous changes are evident.  CTA HEAD  Anterior circulation: Atherosclerotic changes are present within the cavernous right internal carotid artery there is no scratch the the lumen is narrowed but without a significant stenosis relative to the more distal vessel. The terminal right ICA is intact. The left ICA demonstrates some  contrast beginning at the skullbase extending through the cavernous segment. The terminal left ICA is small. The A1 and M1 segments are visualized bilaterally. There is some attenuation of the vessels without a focal stenosis. a.m. MCA branch vessels are moderately attenuated bilaterally, more prominently on the left. The posterior left M2 branches are occluded. Moderate medium and distal ACA branch vessel stenoses are noted.  Posterior circulation: The a left vertebral artery is the dominant vessel. The vertebrobasilar junction is intact. Both posterior cerebral arteries originate from the basilar tip. A PCA branch vessels demonstrate moderate attenuation.  Venous sinuses: The dural sinuses are patent. Straight sinus is patent. The deep veins are patent.  IMPRESSION: 1. Occlusion of the left internal carotid artery with reconstituted flow at the skullbase. 2. 70% stenosis of the right internal carotid artery just beyond the bifurcation. 3. Atherosclerotic changes at the arch without significant proximal stenosis. 4. Atherosclerotic changes in the cavernous right internal carotid artery with tapering of the vessel but no significant stenosis relative to the more distal vessels. 5. Asymmetric attenuation of MCA branch vessels on the left corresponding with the area of acute/subacute nonhemorrhagic infarction within the left parietal and probably posterior left frontal lobe. 6. Moderate diffuse medium and small vessel disease. 7. 4.2 cm soft tissue mass anterior and superior to the right sternocleidomastoid muscle. This could be related to the tail of the parotid, representing a parotid tumor. Metastatic disease is also considered. 8. Nodular thickening of the pleura in the left chest compatible with known metastatic  disease. 9. Loculated fluid versus rounded atelectasis or tumor in the left lung is stable. 10. Asymmetric soft tissue in the posterior right hypopharynx is concerning for tumor. This area should be  amenable to evaluation.   Electronically Signed   By: San Morelle M.D.   On: 06/12/2014 16:46   Mr Jodene Nam Head Wo Contrast  06/13/2014   CLINICAL DATA:  Acute onset of right-sided weakness and speech difficulty. Lung mass with probable lung cancer awaiting biopsy.  EXAM: MRI HEAD WITHOUT CONTRAST  MRA HEAD WITHOUT CONTRAST  TECHNIQUE: Multiplanar, multiecho pulse sequences of the brain and surrounding structures were obtained without intravenous contrast. Angiographic images of the head were obtained using MRA technique without contrast.  COMPARISON:  CTA head and neck 06/12/2014  FINDINGS: MRI HEAD FINDINGS  Acute infarct left parietal lobe extending down to the posterior insula. This measures approximately 4 x 4 cm. Small area of acute infarct in the head of the caudate on the left. Small area of acute infarct in the right frontal white matter and over the convexity of the right frontal lobe. These areas measure several mm each.  Cerebral volume normal for age. Mild chronic microvascular ischemic change in the white matter and pons. Chronic micro hemorrhage left pons.  Negative for mass lesion.  No edema.  Postcontrast imaging reveals normal enhancement. No evidence of metastatic disease.  Paranasal sinuses show minimal mucosal edema.  MRA HEAD FINDINGS  Right carotid is widely patent with mild disease in the cavernous carotid. Right anterior and middle cerebral arteries are patent. Mild atherosclerotic disease in right MCA branches without significant stenosis.  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 Left anterior cerebral artery patent. Left middle cerebral artery shows decreased caliber due to decreased flow. Moderate stenosis left M1 segment. Occluded branch to the left parietal lobe corresponding to the acute infarct.  Basilar artery widely patent. Both vertebral arteries are widely patent. Left PICA patent. Right  PICA not definitely visualized. Superior cerebellar and posterior cerebral arteries patent bilaterally without stenosis.  Negative for cerebral aneurysm.  IMPRESSION: Acute infarct left parietal lobe. Additional small areas of acute infarct involving the left caudate head, right frontal white matter and right frontal cortex over the convexity.  Negative for metastatic disease to the brain  Occluded left internal carotid artery with reconstitution of the supra clinoid segment. Moderate stenosis left M1 segment and occluded branch to the left parietal lobe.   Electronically Signed   By: Franchot Gallo M.D.   On: 06/13/2014 17:06   Mr Jeri Cos ZO Contrast  06/13/2014   CLINICAL DATA:  Acute onset of right-sided weakness and speech difficulty. Lung mass with probable lung cancer awaiting biopsy.  EXAM: MRI HEAD WITHOUT CONTRAST  MRA HEAD WITHOUT CONTRAST  TECHNIQUE: Multiplanar, multiecho pulse sequences of the brain and surrounding structures were obtained without intravenous contrast. Angiographic images of the head were obtained using MRA technique without contrast.  COMPARISON:  CTA head and neck 06/12/2014  FINDINGS: MRI HEAD FINDINGS  Acute infarct left parietal lobe extending down to the posterior insula. This measures approximately 4 x 4 cm. Small area of acute infarct in the head of the caudate on the left. Small area of acute infarct in the right frontal white matter and over the convexity of the right frontal lobe. These areas measure several mm each.  Cerebral volume normal for age. Mild chronic microvascular ischemic change in the white matter  and pons. Chronic micro hemorrhage left pons.  Negative for mass lesion.  No edema.  Postcontrast imaging reveals normal enhancement. No evidence of metastatic disease.  Paranasal sinuses show minimal mucosal edema.  MRA HEAD FINDINGS  Right carotid is widely patent with mild disease in the cavernous carotid. Right anterior and middle cerebral arteries are patent.  Mild atherosclerotic disease in right MCA branches without significant stenosis.  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 Left anterior cerebral artery patent. Left middle cerebral artery shows decreased caliber due to decreased flow. Moderate stenosis left M1 segment. Occluded branch to the left parietal lobe corresponding to the acute infarct.  Basilar artery widely patent. Both vertebral arteries are widely patent. Left PICA patent. Right PICA not definitely visualized. Superior cerebellar and posterior cerebral arteries patent bilaterally without stenosis.  Negative for cerebral aneurysm.  IMPRESSION: Acute infarct left parietal lobe. Additional small areas of acute infarct involving the left caudate head, right frontal white matter and right frontal cortex over the convexity.  Negative for metastatic disease to the brain  Occluded left internal carotid artery with reconstitution of the supra clinoid segment. Moderate stenosis left M1 segment and occluded branch to the left parietal lobe.   Electronically Signed   By: Franchot Gallo M.D.   On: 06/13/2014 17:06   Dg Chest Port 1 View  06/12/2014   CLINICAL DATA:  Altered mental status  EXAM: PORTABLE CHEST - 1 VIEW  COMPARISON:  06/08/2014, 05/31/2014  FINDINGS: The left pleural effusion and loculated pleural collections persist, probably unchanged from the PET examination of 06/08/2014. The right lung is clear. Pulmonary vasculature is normal. There is mild unchanged aortic tortuosity. Heart size is upper normal, unchanged.  IMPRESSION: Left effusion and loculated pleural collections, as observed on the recent PET examination. Right lung is clear.   Electronically Signed   By: Andreas Newport M.D.   On: 06/12/2014 00:17    Labs:  CBC:  Recent Labs  06/11/14 2333 06/11/14 2338 06/12/14 0530 06/14/14 0301 06/15/14 0822  WBC 17.6*  --  14.0* 15.8* 16.1*  HGB 11.3*  12.2* 10.3* 10.6* 11.0*  HCT 33.6* 36.0* 30.6* 31.3* 32.6*  PLT 585*  --  564* 568* 548*    COAGS:  Recent Labs  05/31/14 0452 06/11/14 2333 06/12/14 0530 06/15/14 0822  INR 1.13 1.14 1.16 1.22  APTT 40* 46*  --   --     BMP:  Recent Labs  06/11/14 2333 06/11/14 2338 06/12/14 0530 06/14/14 0301 06/15/14 0822  NA 136 138 137 136 136  K 3.5 3.7 3.5 3.4* 3.5  CL 103 106 105 102 103  CO2 19*  --  21* 20* 21*  GLUCOSE 128* 130* 122* 98 114*  BUN 24* 28* '19 17 19  '$ CALCIUM 8.7*  --  8.7* 8.9 8.9  CREATININE 1.09 0.80 0.77 0.79 0.80  GFRNONAA >60  --  >60 >60 >60  GFRAA >60  --  >60 >60 >60    LIVER FUNCTION TESTS:  Recent Labs  06/11/14 2333 06/12/14 0530 06/14/14 0301 06/15/14 0822  BILITOT 0.4 0.5 0.8 0.6  AST '21 17 19 17  '$ ALT '25 22 17 17  '$ ALKPHOS 77 73 76 83  PROT 6.9 6.8 6.6 6.7  ALBUMIN 2.9* 2.7* 2.8* 2.9*    Assessment and Plan: Patient with history of smoking, loculated left pleural effusion with atypical cytology suggestive of adenocarcinoma/recent Pleurx catheter placement, hypermetabolic left pleural  nodularity/bony metastasis, posterior right hypopharynx abnormality, recent CVA on aspirin/Plavix. Request now received for CT guided biopsy of the left pleural nodularity. Details/risks of procedure, including but not limited to, internal bleeding, infection discussed with patient with his understanding. We'll also attempt to discuss above with pt's girlfriend before proceeding with case, which is tent scheduled for later today.    Signed: Autumn Messing 06/15/2014, 10:08 AM   I spent a total of 15 minutes in face to face in clinical consultation/evaluation, greater than 50% of which was counseling/coordinating care for CT-guided left pleural nodularity biopsy.

## 2014-06-15 NOTE — Progress Notes (Signed)
Dx CVA; lives with SO, active with [facility] RN.  Pt now deemed by psych to lack capacity. CSW following.

## 2014-06-16 ENCOUNTER — Telehealth: Payer: Self-pay | Admitting: Vascular Surgery

## 2014-06-16 DIAGNOSIS — J90 Pleural effusion, not elsewhere classified: Secondary | ICD-10-CM | POA: Diagnosis present

## 2014-06-16 DIAGNOSIS — J948 Other specified pleural conditions: Secondary | ICD-10-CM

## 2014-06-16 DIAGNOSIS — I639 Cerebral infarction, unspecified: Secondary | ICD-10-CM | POA: Diagnosis present

## 2014-06-16 DIAGNOSIS — C139 Malignant neoplasm of hypopharynx, unspecified: Secondary | ICD-10-CM | POA: Diagnosis present

## 2014-06-16 DIAGNOSIS — Z720 Tobacco use: Secondary | ICD-10-CM | POA: Diagnosis present

## 2014-06-16 DIAGNOSIS — B370 Candidal stomatitis: Secondary | ICD-10-CM | POA: Diagnosis present

## 2014-06-16 DIAGNOSIS — D490 Neoplasm of unspecified behavior of digestive system: Secondary | ICD-10-CM | POA: Diagnosis present

## 2014-06-16 MED ORDER — NYSTATIN 100000 UNIT/ML MT SUSP
5.0000 mL | Freq: Four times a day (QID) | OROMUCOSAL | Status: DC
  Administered 2014-06-16 – 2014-06-17 (×7): 500000 [IU] via ORAL
  Filled 2014-06-16 (×8): qty 5

## 2014-06-16 MED ORDER — FLUCONAZOLE 100 MG PO TABS
100.0000 mg | ORAL_TABLET | Freq: Every day | ORAL | Status: DC
  Administered 2014-06-17: 100 mg via ORAL
  Filled 2014-06-16: qty 1

## 2014-06-16 NOTE — Telephone Encounter (Signed)
-----   Message from Angelia Mould, MD sent at 06/15/2014  3:14 PM EDT ----- Regarding: f/u He can be taken off of the list. He needs a f/u visit in 3 months with a carotid duplex. Thanks. CD

## 2014-06-16 NOTE — Progress Notes (Signed)
[place] TEAM 1 - Stepdown/ICU TEAM Progress Note  Ian Gonzales RKY:[id number] DOB: 1941-01-21 DOA: 06/11/2014 PCP: Penni Homans, MD  Admit HPI / Brief Narrative: 74 y.o. WM PMHx memory loss, hypertension, HLD, spinal stenosis lumbar, GERD, recent diagnosis of lung mass, (cytology pending), malignant pleural effusion s/p Pleurx catheter,active tobacco abuse Presented with the acute onset of slurred speech. EMS brought the patient to the hospital.   HPI/Subjective: 5/6  A/O 4. Much more alert interactive today, would like to know when he will be discharged to SNF.    Assessment/Plan: Acute left brain CVA -Initial CT scan unrevealing  -Neurology following  - MRI/MRA; positive for acute infarct see results below   Bilateral Carotid artery stenosis -Right internal carotid 70% stenosis; vascular surgery agrees that acutely no surgery indicated, secondary to stroke -Distal occlusion left carotid artery  Hypopharynx tumor/Parotid tumor -Consult ENT if lung biopsy nondiagnostic; pathology pending on lung biopsy.  Oral pharyngeal candidiasis -Diflucan IV 200 mg 1; then 100 mg daily  -Nystatin QID  Left Malignant pleural effusion - suspected primary lung cancer -4/20 pleural fluid nondiagnostic - Spoke off line with PCCM and they agree that considering patient's acute illness bronchoscopy would not be appropriate. -5/5 S/P CT guided core biopsies of pleural/intercostal tissue in the left lower chest; pathology pending  Hypertension -Would allow permissive HTN secondary to CVA  Hyperlipidemia -Lipid panel not within AHA/NCEP guidelines. -Continue Lipitor 20 mg daily  GERD  Tobacco abuse -Continue Nicoderm Patch  Social issues -Patient's mental status has significantly improved, believed patient is now competent to make his own decisions.     Code Status: FULL Family Communication: no family present at time of exam Disposition Plan: SNF   Consultants: Dr.McNeill  Adrian Prows (neurology) Dr.Christopher Scot Dock, (vascular surgery) Dr.Janardhana Jonnalagadda (Psychiatry)   Procedure/Significant Events: 4/20 left pleural fluid; unable to make definitive diagnosis regarding malignancy 5/2 CT angiogram head;. Occlusion Lt internal carotid artery with reconstituted flow at the skullbase. - 70% stenosis Rt internal carotid artery  -. Asymmetric attenuation of MCA branch vessels on the left corresponding with the area of acute/subacute nonhemorrhagic infarction within the left parietal and probably posterior left frontal lobe. - 4.2 cm soft tissue mass anterior and superior to the right sternocleidomastoid muscle. Parotid  Tumor? vs  Metastatic disease -Nodular thickening  pleura in the left chest compatible with known metastatic disease. -Loculated fluid versus rounded atelectasis or tumor in the left lung is stable. - Asymmetric soft tissue in the posterior right hypopharynx Tumor? This area should be amenable to evaluation. 5/2 - bilateral carotid Dopplers - greater than 80% right internal carotid artery stenosis - left internal carotid artery exhibits felt waveform suggestive of possible distal obstruction 5/3 MRI/MRA brain:Acute infarct left parietal lobe. -Additional small areas of acute infarct involving the left caudate head, right frontal white matter and right frontal cortex over the convexity. -Negative for metastatic disease to the brain -Occluded left internal carotid artery with reconstitution of supra clinoid segment.  -Moderate stenosis left M1 segment and occluded branch to the left parietal lobe. 5/5 CT guided core biopsies of pleural/intercostal tissue in the left lower chest   Culture NA  Antibiotics: NA  DVT prophylaxis: SCD   Devices    LINES / TUBES:      Continuous Infusions:   Objective: VITAL SIGNS: Temp: 98.3 F (36.8 C) (05/06 0800) Temp Source: Oral (05/06 0800) BP: 117/69 mmHg (05/06 0800) Pulse Rate: 97  (05/06 0800) SPO2; FIO2:   Intake/Output Summary (  Last 24 hours) at 06/16/14 1000 Last data filed at 06/16/14 0800  Gross per 24 hour  Intake    360 ml  Output    475 ml  Net   -115 ml     Exam: General:  A/O 4, No acute respiratory distress Lungs: clear to auscultation bilateral   Cardiovascular: Regular rhythm and rate without murmur gallop or rub normal S1 and S2 Abdomen: Nontender, nondistended, soft, bowel sounds positive, no rebound, no ascites, no appreciable mass Extremities: No significant cyanosis, clubbing, or edema bilateral lower extremities Neurologic; cranial nerves II through XII intact, tongue/uvula midline, pupils equal round reactive to light and accommodation, positive left facial droop, positive dysarthria, extremity strength 5/5, sensation intact throughout. did not ambulate patient.   Data Reviewed: Basic Metabolic Panel:  Recent Labs Lab 06/09/14 1347 06/11/14 2333 06/11/14 2338 06/12/14 0530 06/14/14 0301 06/15/14 0822  NA 134* 136 138 137 136 136  K 4.4 3.5 3.7 3.5 3.4* 3.5  CL 101 103 106 105 102 103  CO2 24 19*  --  21* 20* 21*  GLUCOSE 103* 128* 130* 122* 98 114*  BUN 21 24* 28* '19 17 19  '$ CREATININE 1.22 1.09 0.80 0.77 0.79 0.80  CALCIUM 9.5 8.7*  --  8.7* 8.9 8.9  MG  --   --   --   --   --  1.9   Liver Function Tests:  Recent Labs Lab 06/11/14 2333 06/12/14 0530 06/14/14 0301 06/15/14 0822  AST '21 17 19 17  '$ ALT '25 22 17 17  '$ ALKPHOS 77 73 76 83  BILITOT 0.4 0.5 0.8 0.6  PROT 6.9 6.8 6.6 6.7  ALBUMIN 2.9* 2.7* 2.8* 2.9*   No results for input(s): LIPASE, AMYLASE in the last 168 hours. No results for input(s): AMMONIA in the last 168 hours. CBC:  Recent Labs Lab 06/11/14 2333 06/11/14 2338 06/12/14 0530 06/14/14 0301 06/15/14 0822  WBC 17.6*  --  14.0* 15.8* 16.1*  NEUTROABS 13.5*  --  10.5* 11.8*  --   HGB 11.3* 12.2* 10.3* 10.6* 11.0*  HCT 33.6* 36.0* 30.6* 31.3* 32.6*  MCV 89.8  --  88.2 88.4 88.3  PLT 585*  --   564* 568* 548*   Cardiac Enzymes: No results for input(s): CKTOTAL, CKMB, CKMBINDEX, TROPONINI in the last 168 hours. BNP (last 3 results)  Recent Labs  05/30/14 1630  BNP 25.2    ProBNP (last 3 results) No results for input(s): PROBNP in the last 8760 hours.  CBG:  Recent Labs Lab 06/11/14 2351 06/12/14 1208 06/13/14 0815 06/13/14 1257 06/14/14 1220  GLUCAP 130* 106* 124* 100* 111*    Recent Results (from the past 240 hour(s))  MRSA PCR Screening     Status: None   Collection Time: 06/12/14  2:37 AM  Result Value Ref Range Status   MRSA by PCR NEGATIVE NEGATIVE Final    Comment:        The GeneXpert MRSA Assay (FDA approved for NASAL specimens only), is one component of a comprehensive MRSA colonization surveillance program. It is not intended to diagnose MRSA infection nor to guide or monitor treatment for MRSA infections.      Studies:  Recent x-ray studies have been reviewed in detail by the Attending Physician  Scheduled Meds:  Scheduled Meds: . aspirin  81 mg Oral Daily  . atorvastatin  20 mg Oral q1800  . clopidogrel  75 mg Oral Daily  . famotidine  20 mg Oral Daily  . fluconazole (  DIFLUCAN) IV  100 mg Intravenous Q24H  . nicotine  14 mg Transdermal Daily  . nystatin  5 mL Oral QID    Time spent on care of this patient: 40 mins   Betzabe Bevans, Geraldo Docker , MD  [facility] Office  [phone] Pager [phone]  On-Call/Text Page:      [email or web address]      password [email or web address]  If 7PM-7AM, please contact night-coverage [email or web address] Password [email or web address] 06/16/2014, 10:00 AM   LOS: 4 days   Care during the described time interval was provided by me .  I have reviewed this patient's available data, including medical history, events of note, physical examination, radiology studies and test results as part of my evaluation  Dia Crawford, MD [phone] Pager

## 2014-06-16 NOTE — Discharge Summary (Signed)
Physician Discharge Summary  Ian Gonzales:[id number] DOB: Jun 25, 1940 DOA: 06/11/2014  PCP: Penni Homans, MD  Admit date: 06/11/2014 Discharge date: 06/17/2014  Time spent: 40 minutes  Recommendations for Outpatient Follow-up:  Acute left brain CVA -Initial CT scan unrevealing  - MRI/MRA showed Acute infarct left parietal lobe, and bilateral carotid artery stenosis. -Negative brain metastases; See results below -Discharge to SNF    Bilateral Carotid artery stenosis -Right internal carotid 70% stenosis;  -Vascular surgery nothing to be done acutely, follow-up for discussion of elective endarterectomy in 6 months, with Dr. Deitra Mayo (vascular surgery)  Hypopharynx tumor/Parotid tumor -Will not be need  biopsy at this time, preliminary findings from biopsies of left chest wall per Dr. Casimer Bilis (pathology) adenocarcinoma unknown primary source  Oral pharyngeal candidiasis -Diflucan IV 200 mg 1; then 100 mg daily   Left Malignant pleural effusion - suspected primary lung cancer -4/20 pleural fluid nondiagnostic - Spoke off line with PCCM and they agree that considering patient's acute illness bronchoscopy would not be appropriate. -Left chest wall biopsy per Dr. Casimer Bilis (pathology) preliminary finding adenocarcinoma unknown primary source -Per Dr. Wyatt Portela (oncology) note they will arrange oncology follow up with Dr. Curt Bears for him to discuss treatment options next week after discharge  Hypertension -Would allow permissive HTN secondary to CVA  Hyperlipidemia -Lipid panel not within AHA/NCEP guidelines. -Continue Lipitor 20 mg daily  GERD  Tobacco abuse -Continue Nicoderm Patch  Social issues -Patient's mental status has significantly improved, believed patient is now competent to make his own decisions.    Discharge Diagnoses:  Principal Problem:   Subacute delirium Active Problems:   TOBACCO USER   Essential hypertension    GERD   Hyperlipidemia   Malignant pleural effusion   CVA (cerebral vascular accident)   Carotid stenosis   Altered mental status   Lung cancer   Pleural nodule   Parotid neoplasm   Neoplasm of hypopharynx   Discharge Condition: Stable  Diet recommendation: Heart healthy  Filed Weights   06/11/14 2349 06/12/14 0230 06/13/14 0442  Weight: 81.194 kg (179 lb) 81.1 kg (178 lb 12.7 oz) 80.6 kg (177 lb 11.1 oz)    History of present illness:  74 y.o. WM PMHx memory loss, hypertension, HLD, spinal stenosis lumbar, GERD, recent diagnosis of lung mass, (cytology pending), malignant pleural effusion s/p Pleurx catheter,active tobacco abuse Presented with the acute onset of slurred speech. EMS brought the patient to the hospital. During his hospitalization patient was found to have suffered acute left parietal lobe infarct, MRA also showed that patient had bilateral carotid artery stenosis. Vascular surgery agreed that patient would need an endarterectomy however recommended holding off on procedure for 6 months to allow patient to recover. In addition patient was found to have recurrent malignant pleural effusion, which post thoracentesis was not helpful in obtaining diagnosis for underlying cause. Patient underwent left chest wall core biopsy preliminary findings is adenocarcinoma unknown primary source. It was also noted that patient had hypopharynx tumor/parotid gland tumor. Patient has been informed of the preliminary findings of adenocarcinoma and prior to discharge will be seen by oncology to set up outpatient completion of workup.   Consultants: Dr.McNeill Adrian Prows (neurology)/Dr Antony Contras (stroke team). Dr.Christopher Scot Dock, (vascular surgery) Dr.Janardhana Jonnalagadda (Psychiatry) Phone consult Dr. Wyatt Portela (oncology)   Procedure/Significant Events: 4/20 left pleural fluid; unable to make definitive diagnosis regarding malignancy 5/2 CT angiogram head;. Occlusion Lt  internal carotid artery with reconstituted flow at the  skullbase. - 70% stenosis Rt internal carotid artery  -. Asymmetric attenuation of MCA branch vessels on the left corresponding with the area of acute/subacute nonhemorrhagic infarction within the left parietal and probably posterior left frontal lobe. - 4.2 cm soft tissue mass anterior and superior to the right sternocleidomastoid muscle. Parotid Tumor? vs Metastatic disease -Nodular thickening pleura in the left chest compatible with known metastatic disease. -Loculated fluid versus rounded atelectasis or tumor in the left lung is stable. - Asymmetric soft tissue in the posterior right hypopharynx Tumor? This area should be amenable to evaluation. 5/2 - bilateral carotid Dopplers - greater than 80% right internal carotid artery stenosis - left internal carotid artery exhibits felt waveform suggestive of possible distal obstruction 5/3 MRI/MRA brain:Acute infarct left parietal lobe. -Additional small areas of acute infarct involving the left caudate head, right frontal white matter and right frontal cortex over the convexity. -Negative for metastatic disease to the brain -Occluded left internal carotid artery with reconstitution of supra clinoid segment.  -Moderate stenosis left M1 segment and occluded branch to the left parietal lobe. 5/5 CT guided core biopsies left lower chest; of pleural/intercostal tissue per Dr. Casimer Bilis (pathology) pulmonary finding adenocarcinoma  Culture NA  Antibiotics: NA  DVT prophylaxis: SCD   Discharge Exam: Filed Vitals:   06/15/14 1954 06/15/14 2200 06/15/14 2342 06/16/14 0311  BP: 135/74 142/83 151/81 140/72  Pulse: 102  100   Temp: 98.5 F (36.9 C)  98.9 F (37.2 C) 98 F (36.7 C)  TempSrc: Oral  Oral   Resp: '24  17 19  '$ Height:      Weight:      SpO2: 97%  97% 97%    General: A/O 4, No acute respiratory distress Lungs: clear to auscultation bilateral  Cardiovascular:  Regular rhythm and rate without murmur gallop or rub normal S1 and S2 Abdomen: Nontender, nondistended, soft, bowel sounds positive, no rebound, no ascites, no appreciable mass Extremities: No significant cyanosis, clubbing, or edema bilateral lower extremities Neurologic; cranial nerves II through XII intact, tongue/uvula midline, pupils equal round reactive to light and accommodation, positive left facial droop (improved), positive dysarthria (significantly improved), extremity strength 5/5, sensation intact throughout. did not ambulate patient.  Discharge Instructions     Medication List    ASK your doctor about these medications        acetaminophen 325 MG tablet  Commonly known as:  TYLENOL  Take 650 mg by mouth every morning.     amLODipine 5 MG tablet  Commonly known as:  NORVASC  Take 1 tablet (5 mg total) by mouth daily. for high blood pressure     aspirin 81 MG tablet  Take 1 tablet (81 mg total) by mouth daily. Hold until 06/01/14; due to pleurex catheter placement; then resume therapy as previously indictaed     naproxen sodium 220 MG tablet  Commonly known as:  ALEVE  Take 2 tablets (440 mg total) by mouth every morning. Hold until 06/01/14; due to pleurex catheter placement; then resume therapy as previously indictaed     nicotine 14 mg/24hr patch  Commonly known as:  NICODERM CQ  Place 1 patch (14 mg total) onto the skin daily.     ranitidine 300 MG tablet  Commonly known as:  ZANTAC  take 1 tablet by mouth at bedtime     sodium polystyrene 15 GM/60ML suspension  Commonly known as:  KAYEXALATE  Take 60 mLs (15 g total) by mouth once.     traMADol  50 MG tablet  Commonly known as:  ULTRAM  Take 1 tablet (50 mg total) by mouth every 8 (eight) hours as needed for severe pain.       Allergies  Allergen Reactions  . Ibuprofen Other (See Comments)    "It made me feel worse"  . Gabapentin     disequilibrium  . Valacyclovir Other (See Comments)    constipation       The results of significant diagnostics from this hospitalization (including imaging, microbiology, ancillary and laboratory) are listed below for reference.    Significant Diagnostic Studies: Ct Angio Head W/cm &/or Wo Cm  06/12/2014   CLINICAL DATA:  Bilateral carotid artery stenosis. Dominant left brain infarct with right hemi paresis and hemi sensory loss. He is  EXAM: CT ANGIOGRAPHY HEAD AND NECK  TECHNIQUE: Multidetector CT imaging of the head and neck was performed using the standard protocol during bolus administration of intravenous contrast. Multiplanar CT image reconstructions and MIPs were obtained to evaluate the vascular anatomy. Carotid stenosis measurements (when applicable) are obtained utilizing NASCET criteria, using the distal internal carotid diameter as the denominator.  CONTRAST:  1103m OMNIPAQUE IOHEXOL 350 MG/ML SOLN  COMPARISON:  CT head without contrast 06/11/2014.  FINDINGS: CT HEAD  Brain: A a left parietal and possibly posterior left frontal lobe infarct is better visualized with loss of gray-white differentiation and sulcal effacement on today's study. There is no significant mass effect to generate ventricular effacement or midline shift. Scattered subcortical white matter hypoattenuation is present throughout.  Calvarium and skull base: Negative  Paranasal sinuses: Clear  Orbits: The globes and orbits are intact.  CTA NECK  Aortic arch: Atherosclerotic irregularity calcifications are present within the aortic arch. A 3 vessel configuration is present. Atherosclerotic calcifications are present at the origins. There is mild narrowing of the proximal left common carotid artery without a significant stenosis relative to the more distal vessel.  Right carotid system: The right common carotid artery demonstrates mild atherosclerotic change without a significant stenosis. There is significant calcified and noncalcified plaque at the bifurcation. The lumen of the right internal  carotid artery is narrowed to 1.4 mm. This compares with a more normal distal vessel measurement of 4.5 mm. There no tandem stenoses in the neck.  Left carotid system: More prominent atherosclerotic changes are present in the left common carotid artery without a significant stenosis. The left internal carotid artery is occluded 5 mm from the bifurcation. There is minimal contrast in the left ICA at the skullbase. There is no reconstitution in the neck.  Vertebral arteries:Atherosclerotic changes are present at the origins of the vertebral arteries bilaterally. The vessels originate from the subclavian arteries. The left vertebral artery is the dominant vessel. It is tortuous with some calcifications but no significant stenosis. The right vertebral artery is hypoplastic but without focal stenosis.  Skeleton: Heart degenerative changes are present within cervical spine, most evident at C5-6 with uncovertebral spurring and endplate changes. Endplate changes are also noted at C6-7. There is slight degenerative anterolisthesis at C3-4 and C4-5. No focal lytic or blastic lesions are evident. The patient is edentulous.  Other neck: A soft tissue mass lesion is noted just lateral to the sternocleidomastoid and posterior to the vertical ramus of mandible measuring 4.2 x 1.8 x 2.2 cm. There is a central calcification. Asymmetric soft tissue is present in the right posterolateral hypopharynx.  There is marked irregularity of the pleura compatible with known metastatic disease on the left. A posterior rounded  soft tissue density may represent rounded atelectasis versus metastatic disease or loculated fluid. This was not hypermetabolic on the recent PET scan. Paraseptal emphysematous changes are evident.  CTA HEAD  Anterior circulation: Atherosclerotic changes are present within the cavernous right internal carotid artery there is no scratch the the lumen is narrowed but without a significant stenosis relative to the more distal  vessel. The terminal right ICA is intact. The left ICA demonstrates some contrast beginning at the skullbase extending through the cavernous segment. The terminal left ICA is small. The A1 and M1 segments are visualized bilaterally. There is some attenuation of the vessels without a focal stenosis. a.m. MCA branch vessels are moderately attenuated bilaterally, more prominently on the left. The posterior left M2 branches are occluded. Moderate medium and distal ACA branch vessel stenoses are noted.  Posterior circulation: The a left vertebral artery is the dominant vessel. The vertebrobasilar junction is intact. Both posterior cerebral arteries originate from the basilar tip. A PCA branch vessels demonstrate moderate attenuation.  Venous sinuses: The dural sinuses are patent. Straight sinus is patent. The deep veins are patent.  IMPRESSION: 1. Occlusion of the left internal carotid artery with reconstituted flow at the skullbase. 2. 70% stenosis of the right internal carotid artery just beyond the bifurcation. 3. Atherosclerotic changes at the arch without significant proximal stenosis. 4. Atherosclerotic changes in the cavernous right internal carotid artery with tapering of the vessel but no significant stenosis relative to the more distal vessels. 5. Asymmetric attenuation of MCA branch vessels on the left corresponding with the area of acute/subacute nonhemorrhagic infarction within the left parietal and probably posterior left frontal lobe. 6. Moderate diffuse medium and small vessel disease. 7. 4.2 cm soft tissue mass anterior and superior to the right sternocleidomastoid muscle. This could be related to the tail of the parotid, representing a parotid tumor. Metastatic disease is also considered. 8. Nodular thickening of the pleura in the left chest compatible with known metastatic disease. 9. Loculated fluid versus rounded atelectasis or tumor in the left lung is stable. 10. Asymmetric soft tissue in the  posterior right hypopharynx is concerning for tumor. This area should be amenable to evaluation.   Electronically Signed   By: San Morelle M.D.   On: 06/12/2014 16:46   Dg Chest 2 View  05/30/2014   CLINICAL DATA:  Cough, congestion for 3 days.  EXAM: CHEST  2 VIEW  COMPARISON:  05/12/2014  FINDINGS: Moderate-large left pleural effusion with associated atelectasis. Clear right lung. No pneumothorax. Stable cardiomediastinal silhouette. No acute osseous abnormality.  IMPRESSION: Interval enlargement of a moderate-large left pleural effusion.   Electronically Signed   By: Kathreen Devoid   On: 05/30/2014 16:52   Ct Head (brain) Wo Contrast  06/11/2014   CLINICAL DATA:  Right-sided weakness, acute onset  EXAM: CT HEAD WITHOUT CONTRAST  TECHNIQUE: Contiguous axial images were obtained from the base of the skull through the vertex without intravenous contrast.  COMPARISON:  PET-CT scan 428 1,016, head CT 05/04/2014  FINDINGS: No acute intracranial hemorrhage. No focal mass lesion. No CT evidence of acute infarction. No midline shift or mass effect. No hydrocephalus. Basilar cisterns are patent. Mild subcortical white matter hypodensities. Paranasal sinuses and mastoid air cells are clear.  IMPRESSION: 1. No CT evidence of acute infarction. 2. No intracranial hemorrhage. 3. Scattered subcortical white matter hypodensities consistent with microvascular disease. Findings conveyed toKirkpatrick, MDon 06/11/2014  at23:54.   Electronically Signed   By: Helane Gunther.D.  On: 06/11/2014 23:55   Ct Angio Neck W/cm &/or Wo/cm  06/12/2014   CLINICAL DATA:  Bilateral carotid artery stenosis. Dominant left brain infarct with right hemi paresis and hemi sensory loss. He is  EXAM: CT ANGIOGRAPHY HEAD AND NECK  TECHNIQUE: Multidetector CT imaging of the head and neck was performed using the standard protocol during bolus administration of intravenous contrast. Multiplanar CT image reconstructions and MIPs were obtained  to evaluate the vascular anatomy. Carotid stenosis measurements (when applicable) are obtained utilizing NASCET criteria, using the distal internal carotid diameter as the denominator.  CONTRAST:  155m OMNIPAQUE IOHEXOL 350 MG/ML SOLN  COMPARISON:  CT head without contrast 06/11/2014.  FINDINGS: CT HEAD  Brain: A a left parietal and possibly posterior left frontal lobe infarct is better visualized with loss of gray-white differentiation and sulcal effacement on today's study. There is no significant mass effect to generate ventricular effacement or midline shift. Scattered subcortical white matter hypoattenuation is present throughout.  Calvarium and skull base: Negative  Paranasal sinuses: Clear  Orbits: The globes and orbits are intact.  CTA NECK  Aortic arch: Atherosclerotic irregularity calcifications are present within the aortic arch. A 3 vessel configuration is present. Atherosclerotic calcifications are present at the origins. There is mild narrowing of the proximal left common carotid artery without a significant stenosis relative to the more distal vessel.  Right carotid system: The right common carotid artery demonstrates mild atherosclerotic change without a significant stenosis. There is significant calcified and noncalcified plaque at the bifurcation. The lumen of the right internal carotid artery is narrowed to 1.4 mm. This compares with a more normal distal vessel measurement of 4.5 mm. There no tandem stenoses in the neck.  Left carotid system: More prominent atherosclerotic changes are present in the left common carotid artery without a significant stenosis. The left internal carotid artery is occluded 5 mm from the bifurcation. There is minimal contrast in the left ICA at the skullbase. There is no reconstitution in the neck.  Vertebral arteries:Atherosclerotic changes are present at the origins of the vertebral arteries bilaterally. The vessels originate from the subclavian arteries. The left  vertebral artery is the dominant vessel. It is tortuous with some calcifications but no significant stenosis. The right vertebral artery is hypoplastic but without focal stenosis.  Skeleton: Heart degenerative changes are present within cervical spine, most evident at C5-6 with uncovertebral spurring and endplate changes. Endplate changes are also noted at C6-7. There is slight degenerative anterolisthesis at C3-4 and C4-5. No focal lytic or blastic lesions are evident. The patient is edentulous.  Other neck: A soft tissue mass lesion is noted just lateral to the sternocleidomastoid and posterior to the vertical ramus of mandible measuring 4.2 x 1.8 x 2.2 cm. There is a central calcification. Asymmetric soft tissue is present in the right posterolateral hypopharynx.  There is marked irregularity of the pleura compatible with known metastatic disease on the left. A posterior rounded soft tissue density may represent rounded atelectasis versus metastatic disease or loculated fluid. This was not hypermetabolic on the recent PET scan. Paraseptal emphysematous changes are evident.  CTA HEAD  Anterior circulation: Atherosclerotic changes are present within the cavernous right internal carotid artery there is no scratch the the lumen is narrowed but without a significant stenosis relative to the more distal vessel. The terminal right ICA is intact. The left ICA demonstrates some contrast beginning at the skullbase extending through the cavernous segment. The terminal left ICA is small. The A1 and M1  segments are visualized bilaterally. There is some attenuation of the vessels without a focal stenosis. a.m. MCA branch vessels are moderately attenuated bilaterally, more prominently on the left. The posterior left M2 branches are occluded. Moderate medium and distal ACA branch vessel stenoses are noted.  Posterior circulation: The a left vertebral artery is the dominant vessel. The vertebrobasilar junction is intact. Both  posterior cerebral arteries originate from the basilar tip. A PCA branch vessels demonstrate moderate attenuation.  Venous sinuses: The dural sinuses are patent. Straight sinus is patent. The deep veins are patent.  IMPRESSION: 1. Occlusion of the left internal carotid artery with reconstituted flow at the skullbase. 2. 70% stenosis of the right internal carotid artery just beyond the bifurcation. 3. Atherosclerotic changes at the arch without significant proximal stenosis. 4. Atherosclerotic changes in the cavernous right internal carotid artery with tapering of the vessel but no significant stenosis relative to the more distal vessels. 5. Asymmetric attenuation of MCA branch vessels on the left corresponding with the area of acute/subacute nonhemorrhagic infarction within the left parietal and probably posterior left frontal lobe. 6. Moderate diffuse medium and small vessel disease. 7. 4.2 cm soft tissue mass anterior and superior to the right sternocleidomastoid muscle. This could be related to the tail of the parotid, representing a parotid tumor. Metastatic disease is also considered. 8. Nodular thickening of the pleura in the left chest compatible with known metastatic disease. 9. Loculated fluid versus rounded atelectasis or tumor in the left lung is stable. 10. Asymmetric soft tissue in the posterior right hypopharynx is concerning for tumor. This area should be amenable to evaluation.   Electronically Signed   By: San Morelle M.D.   On: 06/12/2014 16:46   Mr Jodene Nam Head Wo Contrast  06/13/2014   CLINICAL DATA:  Acute onset of right-sided weakness and speech difficulty. Lung mass with probable lung cancer awaiting biopsy.  EXAM: MRI HEAD WITHOUT CONTRAST  MRA HEAD WITHOUT CONTRAST  TECHNIQUE: Multiplanar, multiecho pulse sequences of the brain and surrounding structures were obtained without intravenous contrast. Angiographic images of the head were obtained using MRA technique without contrast.   COMPARISON:  CTA head and neck 06/12/2014  FINDINGS: MRI HEAD FINDINGS  Acute infarct left parietal lobe extending down to the posterior insula. This measures approximately 4 x 4 cm. Small area of acute infarct in the head of the caudate on the left. Small area of acute infarct in the right frontal white matter and over the convexity of the right frontal lobe. These areas measure several mm each.  Cerebral volume normal for age. Mild chronic microvascular ischemic change in the white matter and pons. Chronic micro hemorrhage left pons.  Negative for mass lesion.  No edema.  Postcontrast imaging reveals normal enhancement. No evidence of metastatic disease.  Paranasal sinuses show minimal mucosal edema.  MRA HEAD FINDINGS  Right carotid is widely patent with mild disease in the cavernous carotid. Right anterior and middle cerebral arteries are patent. Mild atherosclerotic disease in right MCA branches without significant stenosis.  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 Left anterior cerebral artery patent. Left middle cerebral artery shows decreased caliber due to decreased flow. Moderate stenosis left M1 segment. Occluded branch to the left parietal lobe corresponding to the acute infarct.  Basilar artery widely patent. Both vertebral arteries are widely patent. Left PICA patent. Right PICA not definitely visualized. Superior cerebellar and posterior cerebral arteries patent bilaterally without  stenosis.  Negative for cerebral aneurysm.  IMPRESSION: Acute infarct left parietal lobe. Additional small areas of acute infarct involving the left caudate head, right frontal white matter and right frontal cortex over the convexity.  Negative for metastatic disease to the brain  Occluded left internal carotid artery with reconstitution of the supra clinoid segment. Moderate stenosis left M1 segment and occluded branch to the left parietal  lobe.   Electronically Signed   By: Franchot Gallo M.D.   On: 06/13/2014 17:06   Mr Jeri Cos JQ Contrast  06/13/2014   CLINICAL DATA:  Acute onset of right-sided weakness and speech difficulty. Lung mass with probable lung cancer awaiting biopsy.  EXAM: MRI HEAD WITHOUT CONTRAST  MRA HEAD WITHOUT CONTRAST  TECHNIQUE: Multiplanar, multiecho pulse sequences of the brain and surrounding structures were obtained without intravenous contrast. Angiographic images of the head were obtained using MRA technique without contrast.  COMPARISON:  CTA head and neck 06/12/2014  FINDINGS: MRI HEAD FINDINGS  Acute infarct left parietal lobe extending down to the posterior insula. This measures approximately 4 x 4 cm. Small area of acute infarct in the head of the caudate on the left. Small area of acute infarct in the right frontal white matter and over the convexity of the right frontal lobe. These areas measure several mm each.  Cerebral volume normal for age. Mild chronic microvascular ischemic change in the white matter and pons. Chronic micro hemorrhage left pons.  Negative for mass lesion.  No edema.  Postcontrast imaging reveals normal enhancement. No evidence of metastatic disease.  Paranasal sinuses show minimal mucosal edema.  MRA HEAD FINDINGS  Right carotid is widely patent with mild disease in the cavernous carotid. Right anterior and middle cerebral arteries are patent. Mild atherosclerotic disease in right MCA branches without significant stenosis.  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 Left anterior cerebral artery patent. Left middle cerebral artery shows decreased caliber due to decreased flow. Moderate stenosis left M1 segment. Occluded branch to the left parietal lobe corresponding to the acute infarct.  Basilar artery widely patent. Both vertebral arteries are widely patent. Left PICA patent. Right PICA not definitely  visualized. Superior cerebellar and posterior cerebral arteries patent bilaterally without stenosis.  Negative for cerebral aneurysm.  IMPRESSION: Acute infarct left parietal lobe. Additional small areas of acute infarct involving the left caudate head, right frontal white matter and right frontal cortex over the convexity.  Negative for metastatic disease to the brain  Occluded left internal carotid artery with reconstitution of the supra clinoid segment. Moderate stenosis left M1 segment and occluded branch to the left parietal lobe.   Electronically Signed   By: Franchot Gallo M.D.   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 Recent left chest tube placement. Shortness of breath.  EXAM: NUCLEAR MEDICINE PET SKULL BASE TO THIGH  TECHNIQUE: 9.7 mCi F-18 FDG was injected intravenously. Full-ring PET imaging was performed from the skull base to thigh after the radiotracer. CT data was obtained and used for attenuation correction and anatomic localization.  FASTING BLOOD GLUCOSE:  Value: 109 mg/dl  COMPARISON:  CT chest dated 05/04/2014  FINDINGS: NECK  1.8 cm partially calcified right level II cervical node (series 4/ image 27), max SUV 16.2.  Mild associated hypermetabolism in the right posterior hypopharynx, max  SUV 7.5, although without definite mass on CT.  CHEST  Moderate left pleural effusion, partially loculated, with associated indwelling pleural catheter.  Multifocal pleural-based hypermetabolism, max SUV 7.5 along the medial left heart border and 10.9 along the medial left lower lobe/infrahilar region. Associated lingular and left lower lobe atelectasis/collapse.  Additional pleural-based nodularity along the left fissure (series 6/ image 48), measuring up to 11 mm, max SUV 5.1.  Biapical pleural parenchymal scarring. Underlying mild to moderate paraseptal emphysematous changes.  Right  lungs otherwise clear.  No pneumothorax.  ABDOMEN/PELVIS  No abnormal hypermetabolic activity within the liver, pancreas, or spleen.  Low-density nodular thickening of the left adrenal gland, measuring -8 HUs on unenhanced CT, compatible with a benign adrenal adenoma. Associated mild hypermetabolism, max SUV 4.1.  No hypermetabolic lymph nodes in the abdomen or pelvis.  SKELETON  Multifocal osseous metastases, including:  --Left T5 vertebral body, max SUV 6.7  --Left T7 vertebral body, max SUV 8.1  Mild focal hypermetabolism in the left posterior paraspinous musculature, max SUV 5.0 (PET image 57).  IMPRESSION: Moderate loculated left pleural effusion with indwelling pleural catheter.  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  Focal hypermetabolism in the medial left lower lobe/infrahilar region could reflect the primary lesion, although this is poorly evaluated.  Osseous metastases, as above. Intramuscular metastasis in the left posterior back.  Hypermetabolic, partially calcified right level II cervical node. Metastasis is not excluded, although this appearance/distribution is unusual. Consider biopsy and/or surgical excision.  Associated asymmetric hypermetabolism along the posterior right hypopharynx, without definite mass on CT. Consider visual inspection to exclude primary head/neck cancer.   Electronically Signed   By: Julian Hy M.D.   On: 06/08/2014 16:20   Ct Biopsy  06/15/2014   CLINICAL DATA:  74 year old with a loculated left malignant pleural effusion. Previous PET-CT demonstrated hypermetabolic activity throughout the left pleura. A pleural biopsy has been requested.  EXAM: CT-GUIDED BIOPSY OF LEFT PLEURAL/INTERCOSTAL TISSUE.  Physician: Stephan Minister. Anselm Pancoast, MD  MEDICATIONS: 0.5 mg versed, 25 mcg fentanyl. A radiology nurse monitored the patient for moderate sedation.  ANESTHESIA/SEDATION: Sedation time: 15  minutes  PROCEDURE: The procedure was explained to the patient. The risks and benefits of the procedure were discussed and the patient's questions were addressed. Informed consent was obtained from the patient. CT images through the lower chest were obtained. An area of soft tissue thickening along the intercostal/pleural surface was targeted in the anterior left lower chest. This corresponds with an area of hypermetabolic activity on the prior PET-CT. The left lower chest was prepped and draped in sterile fashion. Skin was anesthetized with 1% lidocaine. Using CT guidance, 17 gauge needle was directed towards the soft tissue thickening. Needle position was confirmed within the targeted tissue and a total of 4 core biopsies were obtained with an 18 gauge device. 17 gauge needle was removed. Bandage placed over the puncture site.  FINDINGS: Soft tissue thickening along the anterior left lower chest in the intercostal/pleural space. There is adjacent pleural fluid and the patient has a PleurX catheter. The biopsied area corresponds with hypermetabolic activity from the prior PET-CT.  Estimated blood loss: Minimal  COMPLICATIONS: None  IMPRESSION: CT-guided core biopsies of left lower chest pleural/intercostal tissue.   Electronically Signed   By: Markus Daft M.D.   On: 06/15/2014 17:25   Dg Chest Port 1 View  06/12/2014   CLINICAL DATA:  Altered mental status  EXAM: PORTABLE CHEST - 1  VIEW  COMPARISON:  06/08/2014, 05/31/2014  FINDINGS: The left pleural effusion and loculated pleural collections persist, probably unchanged from the PET examination of 06/08/2014. The right lung is clear. Pulmonary vasculature is normal. There is mild unchanged aortic tortuosity. Heart size is upper normal, unchanged.  IMPRESSION: Left effusion and loculated pleural collections, as observed on the recent PET examination. Right lung is clear.   Electronically Signed   By: Andreas Newport M.D.   On: 06/12/2014 00:17   Dg Chest Port 1  View  05/31/2014   CLINICAL DATA:  Pleural catheter insertion.  EXAM: PORTABLE CHEST - 1 VIEW  COMPARISON:  05/30/2014  FINDINGS: Small catheter seen at the left lung base. There has been almost complete evacuation of the large left pleural effusion. No pneumothorax. Heart size and vascularity are normal. Tortuosity and calcification of the thoracic aorta. Slight scarring at the right apex. Minimal atelectasis at the right lung base.  IMPRESSION: No pneumothorax after a left pleural catheter insertion. Minimal residual left effusion. Slight atelectasis at the right lung base.   Electronically Signed   By: Lorriane Shire M.D.   On: 05/31/2014 10:51   Dg C-arm 1-60 Min-no Report  05/31/2014   CLINICAL DATA: pluerx catheter insertion   C-ARM 1-60 MINUTES  Fluoroscopy was utilized by the requesting physician.  No radiographic  interpretation.     Microbiology: Recent Results (from the past 240 hour(s))  MRSA PCR Screening     Status: None   Collection Time: 06/12/14  2:37 AM  Result Value Ref Range Status   MRSA by PCR NEGATIVE NEGATIVE Final    Comment:        The GeneXpert MRSA Assay (FDA approved for NASAL specimens only), is one component of a comprehensive MRSA colonization surveillance program. It is not intended to diagnose MRSA infection nor to guide or monitor treatment for MRSA infections.      Labs: Basic Metabolic Panel:  Recent Labs Lab 06/09/14 1347 06/11/14 2333 06/11/14 2338 06/12/14 0530 06/14/14 0301 06/15/14 0822  NA 134* 136 138 137 136 136  K 4.4 3.5 3.7 3.5 3.4* 3.5  CL 101 103 106 105 102 103  CO2 24 19*  --  21* 20* 21*  GLUCOSE 103* 128* 130* 122* 98 114*  BUN 21 24* 28* '19 17 19  '$ CREATININE 1.22 1.09 0.80 0.77 0.79 0.80  CALCIUM 9.5 8.7*  --  8.7* 8.9 8.9  MG  --   --   --   --   --  1.9   Liver Function Tests:  Recent Labs Lab 06/11/14 2333 06/12/14 0530 06/14/14 0301 06/15/14 0822  AST '21 17 19 17  '$ ALT '25 22 17 17  '$ ALKPHOS 77 73 76 83   BILITOT 0.4 0.5 0.8 0.6  PROT 6.9 6.8 6.6 6.7  ALBUMIN 2.9* 2.7* 2.8* 2.9*   No results for input(s): LIPASE, AMYLASE in the last 168 hours. No results for input(s): AMMONIA in the last 168 hours. CBC:  Recent Labs Lab 06/11/14 2333 06/11/14 2338 06/12/14 0530 06/14/14 0301 06/15/14 0822  WBC 17.6*  --  14.0* 15.8* 16.1*  NEUTROABS 13.5*  --  10.5* 11.8*  --   HGB 11.3* 12.2* 10.3* 10.6* 11.0*  HCT 33.6* 36.0* 30.6* 31.3* 32.6*  MCV 89.8  --  88.2 88.4 88.3  PLT 585*  --  564* 568* 548*   Cardiac Enzymes: No results for input(s): CKTOTAL, CKMB, CKMBINDEX, TROPONINI in the last 168 hours. BNP: BNP (last 3 results)  Recent Labs  05/30/14 1630  BNP 25.2    ProBNP (last 3 results) No results for input(s): PROBNP in the last 8760 hours.  CBG:  Recent Labs Lab 06/11/14 2351 06/12/14 1208 06/13/14 0815 06/13/14 1257 06/14/14 1220  GLUCAP 130* 106* 124* 100* 111*       Signed:  Dia Crawford, MD [facility] [phone] pager

## 2014-06-16 NOTE — Telephone Encounter (Signed)
No answer at home #, mailed letter, dpm

## 2014-06-16 NOTE — Progress Notes (Signed)
Speech Language Pathology Treatment: Dysphagia;Cognitive-Linquistic  Patient Details Name: Ian Gonzales MRN: [id number] DOB: 1940/05/24 Today's Date: 06/16/2014 Time: 5053-9767 SLP Time Calculation (min) (ACUTE ONLY): 30 min  Assessment / Plan / Recommendation Clinical Impression  SLP administered advanced trials of Dys 3 textures and thin liquids with no overt signs of aspiration noted. Pt has persistent right-sided weakness and decreased sensation, requiring mildly prolonged mastication and bolus formation. While there is evidence of anterior loss without emergent awareness, he is able to adequately clear PO from his mouth with only trace residuals remaining. Recommend advancement to Dys 2 textures.  Pt's speech and language abilities appear improved from previous session as well, given improved alertness today. His expressive language is marked mostly by phonemic paraphasias during confrontational naming tasks. He requires Min cues for emergent awareness and self-correcting of linguistic errors. His communication is further impacted by imprecise articulation and mildly fast rate of speech, for which he requires Mod cues from SLP to maximize intelligibility.   HPI Other Pertinent Information: Mr. TANNAR BROKER is a 74 y.o. 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 He did not receive IV t-PA due to delay in arrival. Pt with left CVA, MRI pending.    Pertinent Vitals Pain Assessment: No/denies pain Faces Pain Scale: Hurts little more Pain Location: back of head Pain Descriptors / Indicators: Aching Pain Intervention(s): Limited activity within patient's tolerance;Monitored during session;Patient requesting pain meds-RN notified;Repositioned  SLP Plan  Continue with current plan of care    Recommendations Diet recommendations:  Dysphagia 2 (fine chop);Thin liquid Liquids provided via: Cup Medication Administration: Whole meds with puree Supervision: Intermittent supervision to cue for compensatory strategies Compensations: Small sips/bites;Slow rate;Check for anterior loss;Check for pocketing Postural Changes and/or Swallow Maneuvers: Seated upright 90 degrees       Oral Care Recommendations: Oral care BID Follow up Recommendations: Skilled Nursing facility Plan: Continue with current plan of care    Germain Osgood, M.A. CCC-SLP [phone]  Germain Osgood 06/16/2014, 3:25 PM

## 2014-06-16 NOTE — Progress Notes (Signed)
Physical Therapy Treatment Patient Details Name: Ian Gonzales MRN: [id number] DOB: [birth year] Today's Date: 06/16/2014    History of Present Illness Pt admitted with slurred speech and R hemiparesis, awaiting MRI confirmation of CVA.  PMH: Recent dx of malignant pleural effusion, ?lung malignancy, HTN, GERD, smoker.    PT Comments    Pt admitted with above diagnosis. Pt currently with functional limitations due to balance, endurance, and safety awareness deficits. Pt progressing with abiltiy to ambulate today.  Will need continued PT but is improving slowly.   Pt will benefit from skilled PT to increase their independence and safety with mobility to allow discharge to the venue listed below.    Follow Up Recommendations  SNF;Supervision/Assistance - 24 hour     Equipment Recommendations  Other (comment) (TBA)    Recommendations for Other Services       Precautions / Restrictions Precautions Precautions: Fall Precaution Comments: pt impulsive, watch HR Restrictions Weight Bearing Restrictions: No    Mobility  Bed Mobility Overal bed mobility: Needs Assistance Bed Mobility: Supine to Sit     Supine to sit: Min guard Sit to supine: Min guard   General bed mobility comments: toward R side, HOB up  Transfers Overall transfer level: Needs assistance Equipment used: Rolling walker (2 wheeled) Transfers: Sit to/from Stand Sit to Stand: Min assist;+2 safety/equipment         General transfer comment: pulled up on walker, assist to place R hand on walker  Ambulation/Gait Ambulation/Gait assistance: Mod assist;+2 physical assistance Ambulation Distance (Feet): 75 Feet Assistive device: Rolling walker (2 wheeled) Gait Pattern/deviations: Step-to pattern;Step-through pattern;Decreased step length - right;Decreased weight shift to left;Decreased stride length;Staggering right;Trunk flexed;Ataxic   Gait velocity interpretation: Below normal speed for age/gender General  Gait Details: Pt ambulated with RW with poor postural awareness with right lateral lean.  Needed mod assist for stability with posture.  Pt needed cues to move right LE and pt at times would move left LE.  Pt could move right LE to command but needed constant cues to move the LEs.  constant cues and assist for upright postural stability.  Pt also needed assist to hold right UE onto RW due to as pt fatigues his grip worsens and he needs cues and assist to keep hand on RW.     Stairs            Wheelchair Mobility    Modified Rankin (Stroke Patients Only)       Balance Overall balance assessment: Needs assistance;History of Falls Sitting-balance support: No upper extremity supported;Feet supported Sitting balance-Leahy Scale: Fair Sitting balance - Comments: no physical assist, primarily for safety as pt continues to be impulsive at times and leans too far to right or left abruptly.  Postural control: Right lateral lean Standing balance support: Bilateral upper extremity supported;During functional activity Standing balance-Leahy Scale: Poor Standing balance comment: Pt requires mod assist for static stance with RW due to right lateral lean.                     Cognition Arousal/Alertness: Awake/alert Behavior During Therapy: Impulsive Overall Cognitive Status: Impaired/Different from baseline Area of Impairment: Attention;Following commands;Safety/judgement Orientation Level: Disoriented to;Time;Situation;Place Current Attention Level: Sustained Memory: Decreased short-term memory Following Commands: Follows one step commands with increased time (multiple cues) Safety/Judgement: Decreased awareness of safety;Decreased awareness of deficits          Exercises      General Comments General comments (skin integrity, edema, etc.):  Pt continues with poor postural stability.  Needs cues and assist for safety.       Pertinent Vitals/Pain Pain Assessment: Faces Faces  Pain Scale: Hurts little more Pain Location: back of head Pain Descriptors / Indicators: Aching Pain Intervention(s): Limited activity within patient's tolerance;Monitored during session;Patient requesting pain meds-RN notified;Repositioned  VSS    Home Living                      Prior Function            PT Goals (current goals can now be found in the care plan section) Acute Rehab PT Goals Patient Stated Goal: to get better Progress towards PT goals: Progressing toward goals    Frequency  Min 3X/week    PT Plan Current plan remains appropriate    Co-evaluation PT/OT/SLP Co-Evaluation/Treatment: Yes Reason for Co-Treatment: For patient/therapist safety PT goals addressed during session: Mobility/safety with mobility OT goals addressed during session: ADL's and self-care     End of Session Equipment Utilized During Treatment: Gait belt Activity Tolerance: Patient limited by fatigue Patient left: in chair;with call bell/phone within reach;with chair alarm set     Time: 1111-1130 PT Time Calculation (min) (ACUTE ONLY): 19 min  Charges:  $Gait Training: 8-22 mins                    G CodesDenice Paradise 06/29/2014, 1:44 PM [employer] Acute Rehabilitation [phone] (pager)

## 2014-06-16 NOTE — Progress Notes (Signed)
CSW spoke with patient's girlfriend Maisie this afternoon to provide bed offers. Patient is asking for placement in the [facility] area to be closer to where she lives.  Current bed offers discussed and there is a bed offer from [facility].  Maisie stated that she knows where this facility is located and feels it would be best option for patient. She is willing to sign patient's admission paperwork- however- she is somewhat apprehensive as she is not patient's Economist or Medical illustrator. She states that he normally lives alone is has been fully independent of his ADL's. She admits that she has been very concerned with this hospitalization as he experienced significant confusion at the beginning of his hospitalization but is relieved that this seems to be improving a great deal. She will discuss further with patient this weekend.  CSW spoke with Dr.Woods. He stated that patient is not medically stable for d/c at this time. Will continue to monitor and assist with d/c when indicated.  Lorie Phenix. Pauline Good, [place]

## 2014-06-16 NOTE — Progress Notes (Signed)
Occupational Therapy Treatment Patient Details Name: Ian Gonzales MRN: [id number] DOB: September 07, 1940 Today's Date: 06/16/2014    History of present illness Pt admitted with slurred speech and R hemiparesis, awaiting MRI confirmation of CVA.  PMH: Recent dx of malignant pleural effusion, ?lung malignancy, HTN, GERD, smoker.   OT comments  Pt progressing steadily.  Improving ability to use R UE as a functional assist on RW and to set up for grooming. Ambulated out of room with assist of  PT and OT, HR to 125 with ambulation.  Good gains in activity tolerance.  Follow Up Recommendations  SNF;Supervision/Assistance - 24 hour    Equipment Recommendations       Recommendations for Other Services      Precautions / Restrictions Precautions Precautions: Fall Precaution Comments: pt impulsive, watch HR Restrictions Weight Bearing Restrictions: No       Mobility Bed Mobility Overal bed mobility: Needs Assistance Bed Mobility: Supine to Sit     Supine to sit: Min guard     General bed mobility comments: toward R side, HOB up  Transfers Overall transfer level: Needs assistance Equipment used: Rolling walker (2 wheeled) Transfers: Sit to/from Stand Sit to Stand: Min assist;+2 safety/equipment         General transfer comment: pulled up on walker, assist to place R hand on walker    Balance Overall balance assessment: Needs assistance Sitting-balance support: Feet supported Sitting balance-Leahy Scale: Fair       Standing balance-Leahy Scale: Poor                     ADL Overall ADL's : Needs assistance/impaired     Grooming: Minimal assistance;Oral care;Wash/dry face Grooming Details (indicate cue type and reason): encouraged pt to wash face with R hand, used R to stabilize toothpaste while opening Upper Body Bathing: Maximal assistance;Sitting       Upper Body Dressing : Moderate assistance;Sitting                   Functional mobility during  ADLs: +2 for safety/equipment;Moderate assistance (assist to keep R hand on walker) General ADL Comments: Pt cleaned tray table with R hand and washcloth.      Vision                     Perception     Praxis      Cognition   Behavior During Therapy: Impulsive Overall Cognitive Status: Impaired/Different from baseline Area of Impairment: Attention;Following commands;Safety/judgement Orientation Level: Disoriented to;Time;Situation;Place Current Attention Level: Sustained Memory: Decreased short-term memory  Following Commands: Follows one step commands with increased time (multiple cues) Safety/Judgement: Decreased awareness of safety;Decreased awareness of deficits          Extremity/Trunk Assessment               Exercises     Shoulder Instructions       General Comments      Pertinent Vitals/ Pain       Pain Assessment: Faces Faces Pain Scale: Hurts little more Pain Location: back of head Pain Descriptors / Indicators: Aching Pain Intervention(s): Patient requesting pain meds-RN notified  Home Living                                          Prior Functioning/Environment  Frequency Min 3X/week     Progress Toward Goals  OT Goals(current goals can now be found in the care plan section)  Progress towards OT goals: Progressing toward goals  Acute Rehab OT Goals Patient Stated Goal: to get better Time For Goal Achievement: 06/27/14  Plan Discharge plan remains appropriate    Co-evaluation    PT/OT/SLP Co-Evaluation/Treatment: Yes Reason for Co-Treatment: For patient/therapist safety;Necessary to address cognition/behavior during functional activity   OT goals addressed during session: ADL's and self-care      End of Session Equipment Utilized During Treatment: Gait belt;Rolling walker   Activity Tolerance Patient tolerated treatment well   Patient Left in chair;with call bell/phone within  reach;with chair alarm set   Nurse Communication Mobility status;Patient requests pain meds        Time: 1110-1135 OT Time Calculation (min): 25 min  Charges: OT General Charges $OT Visit: 1 Procedure OT Treatments $Self Care/Home Management : 8-22 mins  Malka So 06/16/2014, 11:44 AM  [phone]

## 2014-06-17 MED ORDER — QUETIAPINE FUMARATE 25 MG PO TABS: 25.0000 mg | ORAL_TABLET | Freq: Three times a day (TID) | ORAL | Status: DC | PRN

## 2014-06-17 MED ORDER — NYSTATIN 100000 UNIT/ML MT SUSP: 5.0000 mL | Freq: Four times a day (QID) | OROMUCOSAL | Status: DC

## 2014-06-17 MED ORDER — FLUCONAZOLE 100 MG PO TABS: 100.0000 mg | ORAL_TABLET | Freq: Every day | ORAL | Status: DC

## 2014-06-17 MED ORDER — ATORVASTATIN CALCIUM 20 MG PO TABS: 20.0000 mg | ORAL_TABLET | Freq: Every day | ORAL | Status: AC

## 2014-06-17 MED ORDER — CLOPIDOGREL BISULFATE 75 MG PO TABS: 75.0000 mg | ORAL_TABLET | Freq: Every day | ORAL | Status: AC

## 2014-06-17 MED ORDER — OXYCODONE HCL 5 MG PO TABS: 5.0000 mg | ORAL_TABLET | ORAL | Status: DC | PRN

## 2014-06-17 NOTE — Progress Notes (Signed)
Patient's chart and imaging studies reviewed today. Patient had been seen by Dr. Julien Nordmann from medical oncology on 06/09/2014 for suspected lung malignancy. The preliminary pleural biopsy indicate adenocarcinoma although the final pathology has not been signed out yet.  My recommendation for medical oncology at this point is that we have to await the final pathology and will arrange follow-up for him as an outpatient setting with Dr. Julien Nordmann for definitive oncology care.  I see no immediate intervention is indicated from a medical oncology standpoint.   As the patient gets discharged over the weekend or early Monday we will arrange oncology follow up for him to discuss treatment options.

## 2014-06-17 NOTE — Progress Notes (Signed)
Pt had order for appts with the oncologist but not able to have them scheduled due to today being Saturday.

## 2014-06-17 NOTE — Progress Notes (Signed)
Report called to  [place] at [facility]

## 2014-06-17 NOTE — Progress Notes (Signed)
Transported to [employer] by [employer].

## 2014-06-19 ENCOUNTER — Telehealth: Payer: Self-pay | Admitting: *Deleted

## 2014-06-19 NOTE — Telephone Encounter (Signed)
Called to set up for thoracic clinic, unable to reach

## 2014-06-20 ENCOUNTER — Telehealth: Payer: Self-pay | Admitting: *Deleted

## 2014-06-20 NOTE — Telephone Encounter (Signed)
Called emergency contact and left vm message to call with name and phone number

## 2014-06-20 NOTE — Telephone Encounter (Signed)
Called to schedule Ian Gonzales for an appt with Dr. Julien Nordmann on 5/12, unable to reach

## 2014-06-21 ENCOUNTER — Telehealth: Payer: Self-pay | Admitting: *Deleted

## 2014-06-21 ENCOUNTER — Encounter: Payer: Self-pay | Admitting: *Deleted

## 2014-06-21 NOTE — Telephone Encounter (Signed)
Called again, unable to reach.  Concerned about patient, will contact [employer]

## 2014-06-21 NOTE — Progress Notes (Signed)
  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 male  With a recently diagnosed adenocarcinoma. He originally presented with left chest pain after a stroke. Work up showed a pleural effusion which was drained and found to be non-malignant. He has had a pleurex placed. A PET scan was performed which showed extensive pleural disease with a pleural effusion and metastases in the ribs and vertebral bodies of the thorax. 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 He presents today for my opinion on the role of radiation in his newly diagnosed malignancy.   He is unaccompanied. He is a resident of an assisted living facility since his stroke. He still smokes.  His MRI is negative for metastatic disease. He denies pain over his ribs or chest wall. He occasionally has cervical neck pain but not daily.   PREVIOUS RADIATION THERAPY: No  Past medical, social and family history were reviewed in the electronic chart. Review of symptoms was reviewed in the electronic chart. Medications were reviewed in the electronic chart.   PHYSICAL EXAM: There were no vitals filed for this visit.. . Pleasant male. Expressive aphasia. No pain to palpation of the left chest wall.   IMPRESSION: Metastatic adenocarcinoma of unknown origin  PLAN:He will be referred back to GI for evaluation and search for a primary. He will undergo palliative chemotherapy based on this. He has no needs for palliative radiation at this time but I would be happy to see him back at any point to discuss this.  He met with social work and  physical therapy.    I spent 30 minutes  face to face with the patient and more than 50% of that time was spent in counseling and/or coordination of care.   This document serves as a record of services personally performed by Rubert Frediani, MD. It was created on her behalf by Jalaal Khan, a trained medical scribe. The creation of this record is based on the scribe's personal observations and the provider's statements to them. This document has been checked and approved by the attending provider.  ------------------------------------------------  Jatoya Armbrister, MD  

## 2014-06-21 NOTE — Telephone Encounter (Signed)
Called home and cell phone, unable to leave a vm message

## 2014-06-22 ENCOUNTER — Telehealth: Payer: Self-pay | Admitting: *Deleted

## 2014-06-22 ENCOUNTER — Ambulatory Visit (HOSPITAL_BASED_OUTPATIENT_CLINIC_OR_DEPARTMENT_OTHER): Payer: Medicare Other | Admitting: Internal Medicine

## 2014-06-22 ENCOUNTER — Ambulatory Visit
Admission: RE | Admit: 2014-06-22 | Discharge: 2014-06-22 | Disposition: A | Discharge status: Home / Self Care | Payer: Medicare Other | Source: Ambulatory Visit | Attending: Radiation Oncology | Admitting: Radiation Oncology

## 2014-06-22 ENCOUNTER — Ambulatory Visit: Payer: Medicare Other | Attending: Internal Medicine | Admitting: Physical Therapy

## 2014-06-22 VITALS — BP 116/67 | HR 102 | Temp 97.5°F | Resp 18 | Ht 69.0 in | Wt 164.5 lb

## 2014-06-22 DIAGNOSIS — C801 Malignant (primary) neoplasm, unspecified: Secondary | ICD-10-CM

## 2014-06-22 DIAGNOSIS — R0602 Shortness of breath: Secondary | ICD-10-CM | POA: Diagnosis not present

## 2014-06-22 DIAGNOSIS — R5383 Other fatigue: Secondary | ICD-10-CM

## 2014-06-22 DIAGNOSIS — I639 Cerebral infarction, unspecified: Secondary | ICD-10-CM | POA: Diagnosis not present

## 2014-06-22 DIAGNOSIS — C799 Secondary malignant neoplasm of unspecified site: Secondary | ICD-10-CM | POA: Insufficient documentation

## 2014-06-22 DIAGNOSIS — C3432 Malignant neoplasm of lower lobe, left bronchus or lung: Secondary | ICD-10-CM

## 2014-06-22 NOTE — Progress Notes (Signed)
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  PRIOR THERAPY: Status post Pleurx catheter placement by Dr. Servando Snare on 06/05/2014.  CURRENT THERAPY: None.  INTERVAL HISTORY: Ian Gonzales 74 y.o. male returns to the clinic today for follow-up visit. The patient is currently a resident of skilled nursing facility after he was recently diagnosed with a stroke. He is currently undergoing physical therapy. He continues to complain of increasing fatigue. During his hospitalization the patient underwent CT-guided biopsy of the anterior left pleura and the final pathology was consistent with adenocarcinoma (Accession: [id number]). The tumor is positive for cytokeratin 7 and CDX-2, while it is negative for cytokeratin 20 and TTF-1. The staining pattern is suggestive of an adenocarcinoma of pancreatobiliary or upper gastrointestinal primary source. Please correlate with clinical and radiologic impression. He denied having any significant fever or chills, he has no nausea or vomiting. The patient denied having any significant weight loss or night sweats. He continues to have mild shortness breath but no significant chest pain, cough or hemoptysis. The patient is here today for evaluation and discussion of his treatment options based on the final pathology report and imaging studies.  MEDICAL HISTORY: Past Medical History  Diagnosis Date  . Hypertension   . GERD (gastroesophageal reflux disease)   . Colon polyps   . Back pain, lumbosacral   . Sinusitis acute 03/24/2011  . Memory loss 04/16/2011  . Allergic state 05/30/2011  . Leukocytosis 12/15/2011  . Hyperlipidemia 02/15/2012  . Unspecified sinusitis  (chronic) 04/05/2012  . Epicondylitis 10/10/2012  . Shortness of breath     WITH EXERTION AND BACK PAIN  . Wound drainage     L ELBOW  . Spinal stenosis, lumbar   . Nocturia   . Headache 05/05/2014  . Lung cancer     ALLERGIES:  is allergic to ibuprofen; gabapentin; and valacyclovir.  MEDICATIONS:  Current Outpatient Prescriptions  Medication Sig Dispense Refill  . acetaminophen (TYLENOL) 325 MG tablet Take 650 mg by mouth every morning.    Marland Kitchen amLODipine (NORVASC) 5 MG tablet   0  . atorvastatin (LIPITOR) 20 MG tablet Take 1 tablet (20 mg total) by mouth daily at 6 PM. 30 tablet 0  . clopidogrel (PLAVIX) 75 MG tablet Take 1 tablet (75 mg total) by mouth daily. 30 tablet 0  . fluconazole (DIFLUCAN) 100 MG tablet Take 1 tablet (100 mg total) by mouth daily. 8 tablet 0  . KIONEX 15 GM/60ML suspension   0  . naproxen sodium (ANAPROX) 220 MG tablet Take 220 mg by mouth 2 (two) times daily with a meal.    . nicotine (NICODERM CQ) 14 mg/24hr patch Place 1 patch (14 mg total) onto the skin daily. 28 patch 0  . nystatin (MYCOSTATIN) 100000 UNIT/ML suspension Take 5 mLs (500,000 Units total) by mouth 4 (four) times daily. 60 mL 0  . QUEtiapine (SEROQUEL) 25 MG tablet Take 1 tablet (25 mg total) by mouth 3 (three) times daily as needed (Agitation and restlessness). 60 tablet 0  . ranitidine (ZANTAC) 300 MG tablet take 1 tablet by mouth at bedtime 30 tablet 5  . traMADol (ULTRAM) 50 MG tablet Take 1 tablet (50 mg total) by mouth every 8 (eight) hours as needed for severe  pain. 30 tablet 0  . aspirin 81 MG tablet Take 1 tablet (81 mg total) by mouth daily. Hold until 06/01/14; due to pleurex catheter placement; then resume therapy as previously indictaed 30 tablet   . oxyCODONE (OXY IR/ROXICODONE) 5 MG immediate release tablet Take 1 tablet (5 mg total) by mouth every 4 (four) hours as needed for severe pain. 30 tablet 0   No current facility-administered medications for this visit.    SURGICAL  HISTORY:  Past Surgical History  Procedure Laterality Date  . Lumbar laminectomy  1974    twice  . Urethral stricture dilatation    . Colonoscopy    . Penile prosthesis implant    . Cataracts      REMOVED  . Decompressive lumbar laminectomy level 2 N/A 11/26/2012    Procedure: CENTRAL DECOMPRESSION LUMBAR LAMINECTOMY L3-L4,  L4-L5   ;  Surgeon: Tobi Bastos, MD;  Location: [facility];  Service: Orthopedics;  Laterality: N/A;  . Chest tube insertion Left 05/31/2014    Procedure: INSERTION OF LEFT PLEURAL DRAINAGE CATHETER;  Surgeon: Grace Isaac, MD;  Location: [facility];  Service: Thoracic;  Laterality: Left;  . Pleural effusion drainage Left 05/31/2014    Procedure: DRAINAGE OF LEFT PLEURAL EFFUSION;  Surgeon: Grace Isaac, MD;  Location: [facility];  Service: Thoracic;  Laterality: Left;    REVIEW OF SYSTEMS:  Constitutional: positive for fatigue Eyes: negative Ears, nose, mouth, throat, and face: negative Respiratory: positive for dyspnea on exertion Cardiovascular: negative Gastrointestinal: negative Genitourinary:negative Integument/breast: negative Hematologic/lymphatic: negative Musculoskeletal:negative Neurological: negative Behavioral/Psych: negative Endocrine: negative Allergic/Immunologic: negative   PHYSICAL EXAMINATION: General appearance: alert, cooperative, fatigued and no distress Head: Normocephalic, without obvious abnormality, atraumatic Neck: no adenopathy, no JVD, supple, symmetrical, trachea midline and thyroid not enlarged, symmetric, no tenderness/mass/nodules Lymph nodes: Cervical, supraclavicular, and axillary nodes normal. Resp: clear to auscultation bilaterally Back: symmetric, no curvature. ROM normal. No CVA tenderness. Cardio: regular rate and rhythm, S1, S2 normal, no murmur, click, rub or gallop GI: soft, non-tender; bowel sounds normal; no masses,  no organomegaly Extremities: extremities normal, atraumatic, no cyanosis or edema Neurologic:  Alert and oriented X 3, normal strength and tone. Normal symmetric reflexes. Normal coordination and gait  ECOG PERFORMANCE STATUS: 2 - Symptomatic, <50% confined to bed  Blood pressure 116/67, pulse 102, temperature 97.5 F (36.4 C), temperature source Oral, resp. rate 18, height '5\' 9"'$  (1.753 m), weight 164 lb 8 oz (74.617 kg), SpO2 98 %.  LABORATORY DATA: Lab Results  Component Value Date   WBC 16.1* 06/15/2014   HGB 11.0* 06/15/2014   HCT 32.6* 06/15/2014   MCV 88.3 06/15/2014   PLT 548* 06/15/2014      Chemistry      Component Value Date/Time   NA 136 06/15/2014 0822   NA 140 05/26/2014 1014   K 3.5 06/15/2014 0822   K 4.2 05/26/2014 1014   CL 103 06/15/2014 0822   CO2 21* 06/15/2014 0822   CO2 21* 05/26/2014 1014   BUN 19 06/15/2014 0822   BUN 19.1 05/26/2014 1014   CREATININE 0.80 06/15/2014 0822   CREATININE 0.8 05/26/2014 1014   CREATININE 0.77 05/04/2014 1458      Component Value Date/Time   CALCIUM 8.9 06/15/2014 0822   CALCIUM 7.9* 05/26/2014 1014   ALKPHOS 83 06/15/2014 0822   ALKPHOS 89 05/26/2014 1014   AST 17 06/15/2014 0822   AST 14 05/26/2014 1014   ALT 17 06/15/2014 0822   ALT 16 05/26/2014  1014   BILITOT 0.6 06/15/2014 0822   BILITOT 0.31 05/26/2014 1014       RADIOGRAPHIC STUDIES: Ct Angio Head W/cm &/or Wo Cm  06/12/2014   CLINICAL DATA:  Bilateral carotid artery stenosis. Dominant left brain infarct with right hemi paresis and hemi sensory loss. He is  EXAM: CT ANGIOGRAPHY HEAD AND NECK  TECHNIQUE: Multidetector CT imaging of the head and neck was performed using the standard protocol during bolus administration of intravenous contrast. Multiplanar CT image reconstructions and MIPs were obtained to evaluate the vascular anatomy. Carotid stenosis measurements (when applicable) are obtained utilizing NASCET criteria, using the distal internal carotid diameter as the denominator.  CONTRAST:  159m OMNIPAQUE IOHEXOL 350 MG/ML SOLN  COMPARISON:  CT  head without contrast 06/11/2014.  FINDINGS: CT HEAD  Brain: A a left parietal and possibly posterior left frontal lobe infarct is better visualized with loss of gray-white differentiation and sulcal effacement on today's study. There is no significant mass effect to generate ventricular effacement or midline shift. Scattered subcortical white matter hypoattenuation is present throughout.  Calvarium and skull base: Negative  Paranasal sinuses: Clear  Orbits: The globes and orbits are intact.  CTA NECK  Aortic arch: Atherosclerotic irregularity calcifications are present within the aortic arch. A 3 vessel configuration is present. Atherosclerotic calcifications are present at the origins. There is mild narrowing of the proximal left common carotid artery without a significant stenosis relative to the more distal vessel.  Right carotid system: The right common carotid artery demonstrates mild atherosclerotic change without a significant stenosis. There is significant calcified and noncalcified plaque at the bifurcation. The lumen of the right internal carotid artery is narrowed to 1.4 mm. This compares with a more normal distal vessel measurement of 4.5 mm. There no tandem stenoses in the neck.  Left carotid system: More prominent atherosclerotic changes are present in the left common carotid artery without a significant stenosis. The left internal carotid artery is occluded 5 mm from the bifurcation. There is minimal contrast in the left ICA at the skullbase. There is no reconstitution in the neck.  Vertebral arteries:Atherosclerotic changes are present at the origins of the vertebral arteries bilaterally. The vessels originate from the subclavian arteries. The left vertebral artery is the dominant vessel. It is tortuous with some calcifications but no significant stenosis. The right vertebral artery is hypoplastic but without focal stenosis.  Skeleton: Heart degenerative changes are present within cervical spine,  most evident at C5-6 with uncovertebral spurring and endplate changes. Endplate changes are also noted at C6-7. There is slight degenerative anterolisthesis at C3-4 and C4-5. No focal lytic or blastic lesions are evident. The patient is edentulous.  Other neck: A soft tissue mass lesion is noted just lateral to the sternocleidomastoid and posterior to the vertical ramus of mandible measuring 4.2 x 1.8 x 2.2 cm. There is a central calcification. Asymmetric soft tissue is present in the right posterolateral hypopharynx.  There is marked irregularity of the pleura compatible with known metastatic disease on the left. A posterior rounded soft tissue density may represent rounded atelectasis versus metastatic disease or loculated fluid. This was not hypermetabolic on the recent PET scan. Paraseptal emphysematous changes are evident.  CTA HEAD  Anterior circulation: Atherosclerotic changes are present within the cavernous right internal carotid artery there is no scratch the the lumen is narrowed but without a significant stenosis relative to the more distal vessel. The terminal right ICA is intact. The left ICA demonstrates some contrast beginning at  the skullbase extending through the cavernous segment. The terminal left ICA is small. The A1 and M1 segments are visualized bilaterally. There is some attenuation of the vessels without a focal stenosis. a.m. MCA branch vessels are moderately attenuated bilaterally, more prominently on the left. The posterior left M2 branches are occluded. Moderate medium and distal ACA branch vessel stenoses are noted.  Posterior circulation: The a left vertebral artery is the dominant vessel. The vertebrobasilar junction is intact. Both posterior cerebral arteries originate from the basilar tip. A PCA branch vessels demonstrate moderate attenuation.  Venous sinuses: The dural sinuses are patent. Straight sinus is patent. The deep veins are patent.  IMPRESSION: 1. Occlusion of the left  internal carotid artery with reconstituted flow at the skullbase. 2. 70% stenosis of the right internal carotid artery just beyond the bifurcation. 3. Atherosclerotic changes at the arch without significant proximal stenosis. 4. Atherosclerotic changes in the cavernous right internal carotid artery with tapering of the vessel but no significant stenosis relative to the more distal vessels. 5. Asymmetric attenuation of MCA branch vessels on the left corresponding with the area of acute/subacute nonhemorrhagic infarction within the left parietal and probably posterior left frontal lobe. 6. Moderate diffuse medium and small vessel disease. 7. 4.2 cm soft tissue mass anterior and superior to the right sternocleidomastoid muscle. This could be related to the tail of the parotid, representing a parotid tumor. Metastatic disease is also considered. 8. Nodular thickening of the pleura in the left chest compatible with known metastatic disease. 9. Loculated fluid versus rounded atelectasis or tumor in the left lung is stable. 10. Asymmetric soft tissue in the posterior right hypopharynx is concerning for tumor. This area should be amenable to evaluation.   Electronically Signed   By: San Morelle M.D.   On: 06/12/2014 16:46   Dg Chest 2 View  05/30/2014   CLINICAL DATA:  Cough, congestion for 3 days.  EXAM: CHEST  2 VIEW  COMPARISON:  05/12/2014  FINDINGS: Moderate-large left pleural effusion with associated atelectasis. Clear right lung. No pneumothorax. Stable cardiomediastinal silhouette. No acute osseous abnormality.  IMPRESSION: Interval enlargement of a moderate-large left pleural effusion.   Electronically Signed   By: Kathreen Devoid   On: 05/30/2014 16:52   Ct Head (brain) Wo Contrast  06/11/2014   CLINICAL DATA:  Right-sided weakness, acute onset  EXAM: CT HEAD WITHOUT CONTRAST  TECHNIQUE: Contiguous axial images were obtained from the base of the skull through the vertex without intravenous contrast.   COMPARISON:  PET-CT scan 428 1,016, head CT 05/04/2014  FINDINGS: No acute intracranial hemorrhage. No focal mass lesion. No CT evidence of acute infarction. No midline shift or mass effect. No hydrocephalus. Basilar cisterns are patent. Mild subcortical white matter hypodensities. Paranasal sinuses and mastoid air cells are clear.  IMPRESSION: 1. No CT evidence of acute infarction. 2. No intracranial hemorrhage. 3. Scattered subcortical white matter hypodensities consistent with microvascular disease. Findings conveyed toKirkpatrick, MDon 06/11/2014  at23:54.   Electronically Signed   By: Suzy Bouchard M.D.   On: 06/11/2014 23:55   Ct Angio Neck W/cm &/or Wo/cm  06/12/2014   CLINICAL DATA:  Bilateral carotid artery stenosis. Dominant left brain infarct with right hemi paresis and hemi sensory loss. He is  EXAM: CT ANGIOGRAPHY HEAD AND NECK  TECHNIQUE: Multidetector CT imaging of the head and neck was performed using the standard protocol during bolus administration of intravenous contrast. Multiplanar CT image reconstructions and MIPs were obtained to evaluate the vascular  anatomy. Carotid stenosis measurements (when applicable) are obtained utilizing NASCET criteria, using the distal internal carotid diameter as the denominator.  CONTRAST:  155m OMNIPAQUE IOHEXOL 350 MG/ML SOLN  COMPARISON:  CT head without contrast 06/11/2014.  FINDINGS: CT HEAD  Brain: A a left parietal and possibly posterior left frontal lobe infarct is better visualized with loss of gray-white differentiation and sulcal effacement on today's study. There is no significant mass effect to generate ventricular effacement or midline shift. Scattered subcortical white matter hypoattenuation is present throughout.  Calvarium and skull base: Negative  Paranasal sinuses: Clear  Orbits: The globes and orbits are intact.  CTA NECK  Aortic arch: Atherosclerotic irregularity calcifications are present within the aortic arch. A 3 vessel configuration  is present. Atherosclerotic calcifications are present at the origins. There is mild narrowing of the proximal left common carotid artery without a significant stenosis relative to the more distal vessel.  Right carotid system: The right common carotid artery demonstrates mild atherosclerotic change without a significant stenosis. There is significant calcified and noncalcified plaque at the bifurcation. The lumen of the right internal carotid artery is narrowed to 1.4 mm. This compares with a more normal distal vessel measurement of 4.5 mm. There no tandem stenoses in the neck.  Left carotid system: More prominent atherosclerotic changes are present in the left common carotid artery without a significant stenosis. The left internal carotid artery is occluded 5 mm from the bifurcation. There is minimal contrast in the left ICA at the skullbase. There is no reconstitution in the neck.  Vertebral arteries:Atherosclerotic changes are present at the origins of the vertebral arteries bilaterally. The vessels originate from the subclavian arteries. The left vertebral artery is the dominant vessel. It is tortuous with some calcifications but no significant stenosis. The right vertebral artery is hypoplastic but without focal stenosis.  Skeleton: Heart degenerative changes are present within cervical spine, most evident at C5-6 with uncovertebral spurring and endplate changes. Endplate changes are also noted at C6-7. There is slight degenerative anterolisthesis at C3-4 and C4-5. No focal lytic or blastic lesions are evident. The patient is edentulous.  Other neck: A soft tissue mass lesion is noted just lateral to the sternocleidomastoid and posterior to the vertical ramus of mandible measuring 4.2 x 1.8 x 2.2 cm. There is a central calcification. Asymmetric soft tissue is present in the right posterolateral hypopharynx.  There is marked irregularity of the pleura compatible with known metastatic disease on the left. A  posterior rounded soft tissue density may represent rounded atelectasis versus metastatic disease or loculated fluid. This was not hypermetabolic on the recent PET scan. Paraseptal emphysematous changes are evident.  CTA HEAD  Anterior circulation: Atherosclerotic changes are present within the cavernous right internal carotid artery there is no scratch the the lumen is narrowed but without a significant stenosis relative to the more distal vessel. The terminal right ICA is intact. The left ICA demonstrates some contrast beginning at the skullbase extending through the cavernous segment. The terminal left ICA is small. The A1 and M1 segments are visualized bilaterally. There is some attenuation of the vessels without a focal stenosis. a.m. MCA branch vessels are moderately attenuated bilaterally, more prominently on the left. The posterior left M2 branches are occluded. Moderate medium and distal ACA branch vessel stenoses are noted.  Posterior circulation: The a left vertebral artery is the dominant vessel. The vertebrobasilar junction is intact. Both posterior cerebral arteries originate from the basilar tip. A PCA branch vessels demonstrate moderate  attenuation.  Venous sinuses: The dural sinuses are patent. Straight sinus is patent. The deep veins are patent.  IMPRESSION: 1. Occlusion of the left internal carotid artery with reconstituted flow at the skullbase. 2. 70% stenosis of the right internal carotid artery just beyond the bifurcation. 3. Atherosclerotic changes at the arch without significant proximal stenosis. 4. Atherosclerotic changes in the cavernous right internal carotid artery with tapering of the vessel but no significant stenosis relative to the more distal vessels. 5. Asymmetric attenuation of MCA branch vessels on the left corresponding with the area of acute/subacute nonhemorrhagic infarction within the left parietal and probably posterior left frontal lobe. 6. Moderate diffuse medium and  small vessel disease. 7. 4.2 cm soft tissue mass anterior and superior to the right sternocleidomastoid muscle. This could be related to the tail of the parotid, representing a parotid tumor. Metastatic disease is also considered. 8. Nodular thickening of the pleura in the left chest compatible with known metastatic disease. 9. Loculated fluid versus rounded atelectasis or tumor in the left lung is stable. 10. Asymmetric soft tissue in the posterior right hypopharynx is concerning for tumor. This area should be amenable to evaluation.   Electronically Signed   By: San Morelle M.D.   On: 06/12/2014 16:46   Mr Jodene Nam Head Wo Contrast  06/13/2014   CLINICAL DATA:  Acute onset of right-sided weakness and speech difficulty. Lung mass with probable lung cancer awaiting biopsy.  EXAM: MRI HEAD WITHOUT CONTRAST  MRA HEAD WITHOUT CONTRAST  TECHNIQUE: Multiplanar, multiecho pulse sequences of the brain and surrounding structures were obtained without intravenous contrast. Angiographic images of the head were obtained using MRA technique without contrast.  COMPARISON:  CTA head and neck 06/12/2014  FINDINGS: MRI HEAD FINDINGS  Acute infarct left parietal lobe extending down to the posterior insula. This measures approximately 4 x 4 cm. Small area of acute infarct in the head of the caudate on the left. Small area of acute infarct in the right frontal white matter and over the convexity of the right frontal lobe. These areas measure several mm each.  Cerebral volume normal for age. Mild chronic microvascular ischemic change in the white matter and pons. Chronic micro hemorrhage left pons.  Negative for mass lesion.  No edema.  Postcontrast imaging reveals normal enhancement. No evidence of metastatic disease.  Paranasal sinuses show minimal mucosal edema.  MRA HEAD FINDINGS  Right carotid is widely patent with mild disease in the cavernous carotid. Right anterior and middle cerebral arteries are patent. Mild  atherosclerotic disease in right MCA branches without significant stenosis.  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 Left anterior cerebral artery patent. Left middle cerebral artery shows decreased caliber due to decreased flow. Moderate stenosis left M1 segment. Occluded branch to the left parietal lobe corresponding to the acute infarct.  Basilar artery widely patent. Both vertebral arteries are widely patent. Left PICA patent. Right PICA not definitely visualized. Superior cerebellar and posterior cerebral arteries patent bilaterally without stenosis.  Negative for cerebral aneurysm.  IMPRESSION: Acute infarct left parietal lobe. Additional small areas of acute infarct involving the left caudate head, right frontal white matter and right frontal cortex over the convexity.  Negative for metastatic disease to the brain  Occluded left internal carotid artery with reconstitution of the supra clinoid segment. Moderate stenosis left M1 segment and occluded branch to the left parietal lobe.   Electronically Signed   By: Juanda Crumble  Carlis Abbott M.D.   On: 06/13/2014 17:06   Mr Jeri Cos OX Contrast  06/13/2014   CLINICAL DATA:  Acute onset of right-sided weakness and speech difficulty. Lung mass with probable lung cancer awaiting biopsy.  EXAM: MRI HEAD WITHOUT CONTRAST  MRA HEAD WITHOUT CONTRAST  TECHNIQUE: Multiplanar, multiecho pulse sequences of the brain and surrounding structures were obtained without intravenous contrast. Angiographic images of the head were obtained using MRA technique without contrast.  COMPARISON:  CTA head and neck 06/12/2014  FINDINGS: MRI HEAD FINDINGS  Acute infarct left parietal lobe extending down to the posterior insula. This measures approximately 4 x 4 cm. Small area of acute infarct in the head of the caudate on the left. Small area of acute infarct in the right frontal white matter and over the convexity of  the right frontal lobe. These areas measure several mm each.  Cerebral volume normal for age. Mild chronic microvascular ischemic change in the white matter and pons. Chronic micro hemorrhage left pons.  Negative for mass lesion.  No edema.  Postcontrast imaging reveals normal enhancement. No evidence of metastatic disease.  Paranasal sinuses show minimal mucosal edema.  MRA HEAD FINDINGS  Right carotid is widely patent with mild disease in the cavernous carotid. Right anterior and middle cerebral arteries are patent. Mild atherosclerotic disease in right MCA branches without significant stenosis.  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 Left anterior cerebral artery patent. Left middle cerebral artery shows decreased caliber due to decreased flow. Moderate stenosis left M1 segment. Occluded branch to the left parietal lobe corresponding to the acute infarct.  Basilar artery widely patent. Both vertebral arteries are widely patent. Left PICA patent. Right PICA not definitely visualized. Superior cerebellar and posterior cerebral arteries patent bilaterally without stenosis.  Negative for cerebral aneurysm.  IMPRESSION: Acute infarct left parietal lobe. Additional small areas of acute infarct involving the left caudate head, right frontal white matter and right frontal cortex over the convexity.  Negative for metastatic disease to the brain  Occluded left internal carotid artery with reconstitution of the supra clinoid segment. Moderate stenosis left M1 segment and occluded branch to the left parietal lobe.   Electronically Signed   By: Franchot Gallo M.D.   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 Recent left chest tube placement. Shortness of breath.  EXAM: NUCLEAR MEDICINE PET SKULL BASE TO THIGH  TECHNIQUE:  9.7 mCi F-18 FDG was injected intravenously. Full-ring PET imaging was performed from the skull base to thigh after the radiotracer. CT data was obtained and used for attenuation correction and anatomic localization.  FASTING BLOOD GLUCOSE:  Value: 109 mg/dl  COMPARISON:  CT chest dated 05/04/2014  FINDINGS: NECK  1.8 cm partially calcified right level II cervical node (series 4/ image 27), max SUV 16.2.  Mild associated hypermetabolism in the right posterior hypopharynx, max SUV 7.5, although without definite mass on CT.  CHEST  Moderate left pleural effusion, partially loculated, with associated indwelling pleural catheter.  Multifocal pleural-based hypermetabolism, max SUV 7.5 along the medial left heart border and 10.9 along the medial left lower lobe/infrahilar region. Associated lingular and left lower lobe atelectasis/collapse.  Additional pleural-based nodularity along the left fissure (series 6/ image 48), measuring up to 11 mm, max SUV 5.1.  Biapical pleural parenchymal scarring. Underlying mild  to moderate paraseptal emphysematous changes.  Right lungs otherwise clear.  No pneumothorax.  ABDOMEN/PELVIS  No abnormal hypermetabolic activity within the liver, pancreas, or spleen.  Low-density nodular thickening of the left adrenal gland, measuring -8 HUs on unenhanced CT, compatible with a benign adrenal adenoma. Associated mild hypermetabolism, max SUV 4.1.  No hypermetabolic lymph nodes in the abdomen or pelvis.  SKELETON  Multifocal osseous metastases, including:  --Left T5 vertebral body, max SUV 6.7  --Left T7 vertebral body, max SUV 8.1  Mild focal hypermetabolism in the left posterior paraspinous musculature, max SUV 5.0 (PET image 57).  IMPRESSION: Moderate loculated left pleural effusion with indwelling pleural catheter.  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  Focal hypermetabolism in the medial left lower  lobe/infrahilar region could reflect the primary lesion, although this is poorly evaluated.  Osseous metastases, as above. Intramuscular metastasis in the left posterior back.  Hypermetabolic, partially calcified right level II cervical node. Metastasis is not excluded, although this appearance/distribution is unusual. Consider biopsy and/or surgical excision.  Associated asymmetric hypermetabolism along the posterior right hypopharynx, without definite mass on CT. Consider visual inspection to exclude primary head/neck cancer.   Electronically Signed   By: Julian Hy M.D.   On: 06/08/2014 16:20   Ct Biopsy  06/15/2014   CLINICAL DATA:  74 year old with a loculated left malignant pleural effusion. Previous PET-CT demonstrated hypermetabolic activity throughout the left pleura. A pleural biopsy has been requested.  EXAM: CT-GUIDED BIOPSY OF LEFT PLEURAL/INTERCOSTAL TISSUE.  Physician: Stephan Minister. Anselm Pancoast, MD  MEDICATIONS: 0.5 mg versed, 25 mcg fentanyl. A radiology nurse monitored the patient for moderate sedation.  ANESTHESIA/SEDATION: Sedation time: 15 minutes  PROCEDURE: The procedure was explained to the patient. The risks and benefits of the procedure were discussed and the patient's questions were addressed. Informed consent was obtained from the patient. CT images through the lower chest were obtained. An area of soft tissue thickening along the intercostal/pleural surface was targeted in the anterior left lower chest. This corresponds with an area of hypermetabolic activity on the prior PET-CT. The left lower chest was prepped and draped in sterile fashion. Skin was anesthetized with 1% lidocaine. Using CT guidance, 17 gauge needle was directed towards the soft tissue thickening. Needle position was confirmed within the targeted tissue and a total of 4 core biopsies were obtained with an 18 gauge device. 17 gauge needle was removed. Bandage placed over the puncture site.  FINDINGS: Soft tissue thickening  along the anterior left lower chest in the intercostal/pleural space. There is adjacent pleural fluid and the patient has a PleurX catheter. The biopsied area corresponds with hypermetabolic activity from the prior PET-CT.  Estimated blood loss: Minimal  COMPLICATIONS: None  IMPRESSION: CT-guided core biopsies of left lower chest pleural/intercostal tissue.   Electronically Signed   By: Markus Daft M.D.   On: 06/15/2014 17:25   Dg Chest Port 1 View  06/12/2014   CLINICAL DATA:  Altered mental status  EXAM: PORTABLE CHEST - 1 VIEW  COMPARISON:  06/08/2014, 05/31/2014  FINDINGS: The left pleural effusion and loculated pleural collections persist, probably unchanged from the PET examination of 06/08/2014. The right lung is clear. Pulmonary vasculature is normal. There is mild unchanged aortic tortuosity. Heart size is upper normal, unchanged.  IMPRESSION: Left effusion and loculated pleural collections, as observed on the recent PET examination. Right lung is clear.   Electronically Signed   By: Valerie Roys.D.  On: 06/12/2014 00:17   Dg Chest Port 1 View  05/31/2014   CLINICAL DATA:  Pleural catheter insertion.  EXAM: PORTABLE CHEST - 1 VIEW  COMPARISON:  05/30/2014  FINDINGS: Small catheter seen at the left lung base. There has been almost complete evacuation of the large left pleural effusion. No pneumothorax. Heart size and vascularity are normal. Tortuosity and calcification of the thoracic aorta. Slight scarring at the right apex. Minimal atelectasis at the right lung base.  IMPRESSION: No pneumothorax after a left pleural catheter insertion. Minimal residual left effusion. Slight atelectasis at the right lung base.   Electronically Signed   By: Lorriane Shire M.D.   On: 05/31/2014 10:51   Dg C-arm 1-60 Min-no Report  05/31/2014   CLINICAL DATA: pluerx catheter insertion   C-ARM 1-60 MINUTES  Fluoroscopy was utilized by the requesting physician.  No radiographic  interpretation.     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 I discussed the final pathology and imaging studies with the patient today. I recommended for him to see gastroenterology for evaluation and to rule out any upper gastrointestinal primary. I will see the patient back for follow-up visit in 2 weeks for reevaluation and more detailed discussion of his treatment options after evaluation by gastroenterology. For the recent stroke and weakness, the patient will continue with physical therapy at the skilled nursing facility. The patient was advised to call immediately if he has any concerning symptoms in the interval. The patient voices understanding of current disease status and treatment options and is in agreement with the current care plan.  All questions were answered. The patient knows to call the clinic with any problems, questions or concerns. We can certainly see the patient much sooner if necessary.  I spent 15 minutes counseling the patient face to face. The total time spent in the appointment was 25 minutes.  Disclaimer: This note was dictated with voice recognition software. Similar sounding words can inadvertently be transcribed and may not be corrected upon review.

## 2014-06-22 NOTE — Telephone Encounter (Signed)
Called and spoke with Ian Gonzales at [facility] [phone] to confirm return transportation to rehab facility.  Ian Gonzales has advised a driver is on the way ETA

## 2014-06-22 NOTE — Telephone Encounter (Signed)
Called and spoke with Elliot Gault at [facility] [phone] to confirm transportation for pt's appt today.

## 2014-06-23 ENCOUNTER — Telehealth: Payer: Self-pay | Admitting: Internal Medicine

## 2014-06-23 NOTE — Telephone Encounter (Signed)
no vm available....mailed pt appt sched and letter °

## 2014-06-24 ENCOUNTER — Encounter: Payer: Self-pay | Admitting: Internal Medicine

## 2014-06-26 ENCOUNTER — Telehealth: Payer: Self-pay

## 2014-06-26 NOTE — Telephone Encounter (Signed)
i am not sure how aware patient is   He is in a SNF also   He probably knows fromm Julien Nordmann that needs EGD       Previous Messages     ----- Message -----   From: Milus Banister, MD   Sent: 06/26/2014  7:17 AM    To: Gatha Mayer, MD, Barron Alvine, CMA  Subject: RE: egd direct                  i can do it Thursday.   I'll send to Ian Gonzales as well to put on the schedule. (MAC, this Thursday)   Do you know if the patient is already aware we'll be calling?   Thanks   Mayreli Alden,  See above. Thanks    ----- Message -----   From: Gatha Mayer, MD   Sent: 06/25/2014  9:15 AM    To: Marlon Pel, RN, Milus Banister, MD  Subject: egd direct                    Ian Gonzales,   This man has lung cancer which may be met from UGI by staining of biopsy. Mohamed requests an EGD.   I think direct is ok though he has some issues - is in a SNF after a stroke .   I reviewed chart and it is noted:  RA pulse ox 98% (cannot see that he is on O2)  He has had a Pleurex catheter placed to drain a pleural effusion  He is on Plavix (not a problem for EGD + Bx)   Not doing the ERCP I asked about on Fri.   Any chance you could do this on Thursday?   I will do 0730 hospital or [place] [facility] if possible though I bet CRNA's would prefer we do at hospital    Thanks for considering this.   Glendell Docker

## 2014-06-26 NOTE — Telephone Encounter (Signed)
No message machine to call back on all numbers listed    Dr Ardis Hughs the pt is to remain ON plavix is that correct?

## 2014-06-26 NOTE — Telephone Encounter (Signed)
-----  Message from Milus Banister, MD sent at 06/26/2014  7:17 AM EDT ----- Regarding: RE: egd direct i can do it Thursday.  I'll send to Taneal Sonntag as well to put on the schedule.  (MAC, this Thursday)  Do you know if the patient is already aware we'll be calling?  Thanks  Jamarie Joplin, See above.  Thanks   ----- Message -----    From: Gatha Mayer, MD    Sent: 06/25/2014   9:15 AM      To: Marlon Pel, RN, Milus Banister, MD Subject: egd direct                                     Linna Hoff,  This man has lung cancer which may be met from UGI by staining of biopsy. Mohamed requests an EGD.  I think direct is ok though he has some issues - is in a SNF after a stroke .  I reviewed chart and it is noted: RA pulse ox 98% (cannot see that he is on O2) He has had a Pleurex catheter placed to drain a pleural effusion He is on Plavix (not a problem for EGD + Bx)  Not doing the ERCP I asked about on Fri.  Any chance you could do this on Thursday?  I will do 0730 hospital or [place] [facility]W,R if possible though I bet CRNA's would prefer we do at hospital   Thanks for considering this.  Glendell Docker

## 2014-06-27 ENCOUNTER — Other Ambulatory Visit: Payer: Self-pay

## 2014-06-27 ENCOUNTER — Ambulatory Visit: Payer: Medicare Other | Admitting: Family Medicine

## 2014-06-27 DIAGNOSIS — C3490 Malignant neoplasm of unspecified part of unspecified bronchus or lung: Secondary | ICD-10-CM

## 2014-06-27 NOTE — Telephone Encounter (Signed)
Yes, he can remain on plavix.  Thanks

## 2014-06-27 NOTE — Telephone Encounter (Signed)
No answer on any numbers listed and no voice mail, I placed a call to the emergency contact number and the pt is at [facility] [phone].  I spoke with Solmon Ice and she was faxed the instructions for the pt EGD to [phone].  She will give pt instructions to his nurse.  I have also called his emergency contact number to be the responsible person with him at the hospital.  She has been given the date and time and location and agrees to be with the pt at the hospital for the procedure.  A call was placed back to [facility] and confirmed all of the above information.

## 2014-06-29 ENCOUNTER — Ambulatory Visit (HOSPITAL_COMMUNITY): Payer: Medicare Other | Admitting: Certified Registered Nurse Anesthetist

## 2014-06-29 ENCOUNTER — Encounter (HOSPITAL_COMMUNITY): Payer: Self-pay | Admitting: Certified Registered Nurse Anesthetist

## 2014-06-29 ENCOUNTER — Ambulatory Visit (HOSPITAL_COMMUNITY)
Admission: RE | Admit: 2014-06-29 | Discharge: 2014-06-29 | Disposition: A | Discharge status: Home / Self Care | Payer: Medicare Other | Source: Ambulatory Visit | Attending: Gastroenterology | Admitting: Gastroenterology

## 2014-06-29 ENCOUNTER — Encounter (HOSPITAL_COMMUNITY)
Admission: RE | Disposition: A | Discharge status: Home / Self Care | Payer: Self-pay | Source: Ambulatory Visit | Attending: Gastroenterology

## 2014-06-29 DIAGNOSIS — J449 Chronic obstructive pulmonary disease, unspecified: Secondary | ICD-10-CM | POA: Diagnosis not present

## 2014-06-29 DIAGNOSIS — Z8673 Personal history of transient ischemic attack (TIA), and cerebral infarction without residual deficits: Secondary | ICD-10-CM | POA: Diagnosis not present

## 2014-06-29 DIAGNOSIS — C3490 Malignant neoplasm of unspecified part of unspecified bronchus or lung: Secondary | ICD-10-CM | POA: Diagnosis not present

## 2014-06-29 DIAGNOSIS — F17210 Nicotine dependence, cigarettes, uncomplicated: Secondary | ICD-10-CM | POA: Diagnosis not present

## 2014-06-29 DIAGNOSIS — K219 Gastro-esophageal reflux disease without esophagitis: Secondary | ICD-10-CM | POA: Diagnosis not present

## 2014-06-29 DIAGNOSIS — I10 Essential (primary) hypertension: Secondary | ICD-10-CM | POA: Insufficient documentation

## 2014-06-29 DIAGNOSIS — C7800 Secondary malignant neoplasm of unspecified lung: Secondary | ICD-10-CM | POA: Insufficient documentation

## 2014-06-29 DIAGNOSIS — K2970 Gastritis, unspecified, without bleeding: Secondary | ICD-10-CM | POA: Diagnosis not present

## 2014-06-29 DIAGNOSIS — C801 Malignant (primary) neoplasm, unspecified: Secondary | ICD-10-CM | POA: Insufficient documentation

## 2014-06-29 HISTORY — PX: ESOPHAGOGASTRODUODENOSCOPY (EGD) WITH PROPOFOL: SHX5813

## 2014-06-29 SURGERY — ESOPHAGOGASTRODUODENOSCOPY (EGD) WITH PROPOFOL
Anesthesia: Monitor Anesthesia Care

## 2014-06-29 MED ORDER — PROPOFOL 10 MG/ML IV BOLUS
INTRAVENOUS | Status: AC
  Filled 2014-06-29: qty 20

## 2014-06-29 MED ORDER — SODIUM CHLORIDE 0.9 % IV SOLN: INTRAVENOUS | Status: DC

## 2014-06-29 MED ORDER — LIDOCAINE HCL (CARDIAC) 20 MG/ML IV SOLN
INTRAVENOUS | Status: DC | PRN
  Administered 2014-06-29: 80 mg via INTRAVENOUS

## 2014-06-29 MED ORDER — LACTATED RINGERS IV SOLN
INTRAVENOUS | Status: DC
  Administered 2014-06-29: 1000 mL via INTRAVENOUS

## 2014-06-29 MED ORDER — ONDANSETRON HCL 4 MG/2ML IJ SOLN
INTRAMUSCULAR | Status: DC | PRN
  Administered 2014-06-29: 4 mg via INTRAVENOUS

## 2014-06-29 MED ORDER — ONDANSETRON HCL 4 MG/2ML IJ SOLN
INTRAMUSCULAR | Status: AC
  Filled 2014-06-29: qty 2

## 2014-06-29 MED ORDER — PROPOFOL INFUSION 10 MG/ML OPTIME
INTRAVENOUS | Status: DC | PRN
  Administered 2014-06-29: 140 ug/kg/min via INTRAVENOUS

## 2014-06-29 MED ORDER — FENTANYL CITRATE (PF) 100 MCG/2ML IJ SOLN: 25.0000 ug | INTRAMUSCULAR | Status: DC | PRN

## 2014-06-29 MED ORDER — PROPOFOL 10 MG/ML IV BOLUS
INTRAVENOUS | Status: DC | PRN
  Administered 2014-06-29: 10 mg via INTRAVENOUS
  Administered 2014-06-29: 20 mg via INTRAVENOUS

## 2014-06-29 MED ORDER — LIDOCAINE HCL (CARDIAC) 20 MG/ML IV SOLN
INTRAVENOUS | Status: AC
  Filled 2014-06-29: qty 5

## 2014-06-29 SURGICAL SUPPLY — 14 items

## 2014-06-29 NOTE — Anesthesia Postprocedure Evaluation (Signed)
  Anesthesia Post-op Note  Patient: Ian Gonzales  Procedure(s) Performed: Procedure(s) (LRB): ESOPHAGOGASTRODUODENOSCOPY (EGD) WITH PROPOFOL (N/A)  Patient Location: PACU  Anesthesia Type: MAC  Level of Consciousness: awake and alert   Airway and Oxygen Therapy: Patient Spontanous Breathing  Post-op Pain: mild  Post-op Assessment: Post-op Vital signs reviewed, Patient's Cardiovascular Status Stable, Respiratory Function Stable, Patent Airway and No signs of Nausea or vomiting  Last Vitals:  Filed Vitals:   06/29/14 1355  BP: 135/71  Pulse: 88  Temp:   Resp: 17    Post-op Vital Signs: stable   Complications: No apparent anesthesia complications

## 2014-06-29 NOTE — Discharge Instructions (Signed)

## 2014-06-29 NOTE — Interval H&P Note (Signed)
History and Physical Interval Note:  06/29/2014 12:54 PM  Dyanne Carrel  has presented today for surgery, with the diagnosis of lung cancer   The various methods of treatment have been discussed with the patient and family. After consideration of risks, benefits and other options for treatment, the patient has consented to  Procedure(s): ESOPHAGOGASTRODUODENOSCOPY (EGD) WITH PROPOFOL (N/A) as a surgical intervention .  The patient's history has been reviewed, patient examined, no change in status, stable for surgery.  I have reviewed the patient's chart and labs.  Questions were answered to the patient's satisfaction.     Milus Banister

## 2014-06-29 NOTE — Anesthesia Preprocedure Evaluation (Signed)
Anesthesia Evaluation  Patient identified by MRN, date of birth, ID band Patient awake    Reviewed: Allergy & Precautions, NPO status , Patient's Chart, lab work & pertinent test results  History of Anesthesia Complications Negative for: history of anesthetic complications  Airway Mallampati: II  TM Distance: >3 FB Neck ROM: Full    Dental  (+) Edentulous Upper, Edentulous Lower, Dental Advisory Given   Pulmonary shortness of breath, with exertion and at rest, COPDCurrent Smoker,  Lung cancer. Pleural effusion.   Pulmonary exam normal + decreased breath sounds      Cardiovascular hypertension, Pt. on medications Normal cardiovascular examRhythm:Regular Rate:Normal     Neuro/Psych CVA    GI/Hepatic Neg liver ROS, GERD-  Controlled,  Endo/Other  negative endocrine ROS  Renal/GU negative Renal ROS     Musculoskeletal   Abdominal Normal abdominal exam  (+)   Peds  Hematology   Anesthesia Other Findings Metastatic adenocarcinoma unknown origin  Reproductive/Obstetrics                             Anesthesia Physical Anesthesia Plan  ASA: III  Anesthesia Plan: MAC   Post-op Pain Management:    Induction:   Airway Management Planned:   Additional Equipment:   Intra-op Plan:   Post-operative Plan:   Informed Consent:   Plan Discussed with: Surgeon  Anesthesia Plan Comments:         Anesthesia Quick Evaluation

## 2014-06-29 NOTE — Op Note (Signed)
[place], [id number]   ENDOSCOPY PROCEDURE REPORT  PATIENT: Ian Gonzales, Chain  MR#: [id number] BIRTHDATE: [birth year] , 26  yrs. old GENDER: male ENDOSCOPIST: Milus Banister, MD REFERRED BY:  Curt Bears, M.D. PROCEDURE DATE:  06/29/2014 PROCEDURE:  EGD, diagnostic ASA CLASS:     Class III INDICATIONS:  lung biopsy recently showed adenocarcinoma; possible UGI primary.  PET scan no clear etiology.  No GI symptoms (nausea, post prandial pain or vomiting), stable weight.Marland Kitchen MEDICATIONS: Monitored anesthesia care TOPICAL ANESTHETIC: none  DESCRIPTION OF PROCEDURE: After the risks benefits and alternatives of the procedure were thoroughly explained, informed consent was obtained.  The [place] [id number] endoscope was introduced through the mouth and advanced to the second portion of the duodenum , Without limitations.  The instrument was slowly withdrawn as the mucosa was fully examined.  There was very mild, non-specific gastritis.  The examination was otherwise normal.  Retroflexed views revealed no abnormalities. The scope was then withdrawn from the patient and the procedure completed. COMPLICATIONS: There were no immediate complications.  ENDOSCOPIC IMPRESSION: There was very mild, non-specific gastritis. The examination was otherwise normal  RECOMMENDATIONS: Continue oncologic care for adenocarcinoma, unknown primary.    eSigned:  Milus Banister, MD 06/29/2014 1:29 PM    CC: Silvano Rusk, MD

## 2014-06-29 NOTE — Transfer of Care (Signed)
Immediate Anesthesia Transfer of Care Note  Patient: Ian Gonzales  Procedure(s) Performed: Procedure(s): ESOPHAGOGASTRODUODENOSCOPY (EGD) WITH PROPOFOL (N/A)  Patient Location: endoscopy  Anesthesia Type:MAC  Level of Consciousness:  sedated, patient cooperative and responds to stimulation  Airway & Oxygen Therapy:Patient Spontanous Breathing and Patient connected to face mask oxgen  Post-op Assessment:  Report given to endoscopy RN and Post -op Vital signs reviewed and stable  Post vital signs:  Reviewed and stable  Last Vitals:  Filed Vitals:   06/29/14 1336  BP: 134/86  Pulse: 85  Temp: 36.6 C  Resp: 17    Complications: No apparent anesthesia complications

## 2014-06-29 NOTE — H&P (View-Only) (Signed)
  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 male  With a recently diagnosed adenocarcinoma. He originally presented with left chest pain after a stroke. Work up showed a pleural effusion which was drained and found to be non-malignant. He has had a pleurex placed. A PET scan was performed which showed extensive pleural disease with a pleural effusion and metastases in the ribs and vertebral bodies of the thorax. 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 He presents today for my opinion on the role of radiation in his newly diagnosed malignancy.   He is unaccompanied. He is a resident of an assisted living facility since his stroke. He still smokes.  His MRI is negative for metastatic disease. He denies pain over his ribs or chest wall. He occasionally has cervical neck pain but not daily.   PREVIOUS RADIATION THERAPY: No  Past medical, social and family history were reviewed in the electronic chart. Review of symptoms was reviewed in the electronic chart. Medications were reviewed in the electronic chart.   PHYSICAL EXAM: There were no vitals filed for this visit.. . Pleasant male. Expressive aphasia. No pain to palpation of the left chest wall.   IMPRESSION: Metastatic adenocarcinoma of unknown origin  PLAN:He will be referred back to GI for evaluation and search for a primary. He will undergo palliative chemotherapy based on this. He has no needs for palliative radiation at this time but I would be happy to see him back at any point to discuss this.  He met with social work and  physical therapy.    I spent 30 minutes  face to face with the patient and more than 50% of that time was spent in counseling and/or coordination of care.   This document serves as a record of services personally performed by Thea Silversmith, MD. It was created on her behalf by Darcus Austin, a trained medical scribe. The creation of this record is based on the scribe's personal observations and the provider's statements to them. This document has been checked and approved by the attending provider.  ------------------------------------------------  Thea Silversmith, MD

## 2014-06-30 ENCOUNTER — Encounter (HOSPITAL_COMMUNITY): Payer: Self-pay | Admitting: Gastroenterology

## 2014-07-04 ENCOUNTER — Ambulatory Visit: Payer: Medicare Other | Admitting: Family Medicine

## 2014-07-06 ENCOUNTER — Ambulatory Visit (HOSPITAL_BASED_OUTPATIENT_CLINIC_OR_DEPARTMENT_OTHER): Payer: Medicare Other | Admitting: Physician Assistant

## 2014-07-06 ENCOUNTER — Encounter: Payer: Self-pay | Admitting: Physician Assistant

## 2014-07-06 ENCOUNTER — Other Ambulatory Visit (HOSPITAL_BASED_OUTPATIENT_CLINIC_OR_DEPARTMENT_OTHER): Payer: Medicare Other

## 2014-07-06 ENCOUNTER — Telehealth: Payer: Self-pay | Admitting: *Deleted

## 2014-07-06 ENCOUNTER — Encounter: Payer: Self-pay | Admitting: *Deleted

## 2014-07-06 VITALS — BP 112/72 | HR 108 | Temp 97.9°F | Resp 24 | Ht 69.0 in | Wt 163.4 lb

## 2014-07-06 DIAGNOSIS — C782 Secondary malignant neoplasm of pleura: Secondary | ICD-10-CM

## 2014-07-06 DIAGNOSIS — C7951 Secondary malignant neoplasm of bone: Secondary | ICD-10-CM

## 2014-07-06 DIAGNOSIS — J910 Malignant pleural effusion: Secondary | ICD-10-CM

## 2014-07-06 DIAGNOSIS — C801 Malignant (primary) neoplasm, unspecified: Secondary | ICD-10-CM

## 2014-07-06 DIAGNOSIS — C3492 Malignant neoplasm of unspecified part of left bronchus or lung: Secondary | ICD-10-CM | POA: Insufficient documentation

## 2014-07-06 DIAGNOSIS — C799 Secondary malignant neoplasm of unspecified site: Secondary | ICD-10-CM

## 2014-07-06 LAB — CBC WITH DIFFERENTIAL/PLATELET
BASO%: 0.2 % (ref 0.0–2.0)
Basophils Absolute: 0 10*3/uL (ref 0.0–0.1)
EOS%: 1 % (ref 0.0–7.0)
Eosinophils Absolute: 0.2 10*3/uL (ref 0.0–0.5)
HCT: 30 % — ABNORMAL LOW (ref 38.4–49.9)
HEMOGLOBIN: 9.9 g/dL — AB (ref 13.0–17.1)
LYMPH%: 10.5 % — ABNORMAL LOW (ref 14.0–49.0)
MCH: 29.3 pg (ref 27.2–33.4)
MCHC: 33 g/dL (ref 32.0–36.0)
MCV: 88.8 fL (ref 79.3–98.0)
MONO#: 1.4 10*3/uL — AB (ref 0.1–0.9)
MONO%: 8.4 % (ref 0.0–14.0)
NEUT#: 13.3 10*3/uL — ABNORMAL HIGH (ref 1.5–6.5)
NEUT%: 79.9 % — ABNORMAL HIGH (ref 39.0–75.0)
PLATELETS: 446 10*3/uL — AB (ref 140–400)
RBC: 3.38 10*6/uL — ABNORMAL LOW (ref 4.20–5.82)
RDW: 13.7 % (ref 11.0–14.6)
WBC: 16.6 10*3/uL — ABNORMAL HIGH (ref 4.0–10.3)
lymph#: 1.7 10*3/uL (ref 0.9–3.3)
nRBC: 0 % (ref 0–0)

## 2014-07-06 LAB — COMPREHENSIVE METABOLIC PANEL (CC13)
ALBUMIN: 2.8 g/dL — AB (ref 3.5–5.0)
ALK PHOS: 92 U/L (ref 40–150)
ALT: 16 U/L (ref 0–55)
ANION GAP: 12 meq/L — AB (ref 3–11)
AST: 16 U/L (ref 5–34)
BUN: 18.2 mg/dL (ref 7.0–26.0)
CO2: 23 meq/L (ref 22–29)
CREATININE: 0.8 mg/dL (ref 0.7–1.3)
Calcium: 9.3 mg/dL (ref 8.4–10.4)
Chloride: 103 mEq/L (ref 98–109)
EGFR: 87 mL/min/{1.73_m2} — AB (ref >90)
Glucose: 123 mg/dl (ref 70–140)
POTASSIUM: 3.7 meq/L (ref 3.5–5.1)
Sodium: 138 mEq/L (ref 136–145)
Total Bilirubin: 0.35 mg/dL (ref 0.20–1.20)
Total Protein: 6.9 g/dL (ref 6.4–8.3)

## 2014-07-06 LAB — TECHNOLOGIST REVIEW

## 2014-07-06 MED ORDER — PROCHLORPERAZINE MALEATE 10 MG PO TABS: 10.0000 mg | ORAL_TABLET | Freq: Four times a day (QID) | ORAL | Status: AC | PRN

## 2014-07-06 MED ORDER — DEXAMETHASONE 4 MG PO TABS: 4.0000 mg | ORAL_TABLET | Freq: Two times a day (BID) | ORAL | Status: AC

## 2014-07-06 MED ORDER — CYANOCOBALAMIN 1000 MCG/ML IJ SOLN
INTRAMUSCULAR | Status: AC
  Filled 2014-07-06: qty 1

## 2014-07-06 MED ORDER — FOLIC ACID 1 MG PO TABS: 1.0000 mg | ORAL_TABLET | Freq: Every day | ORAL | Status: AC

## 2014-07-06 MED ORDER — CYANOCOBALAMIN 1000 MCG/ML IJ SOLN
1000.0000 ug | Freq: Once | INTRAMUSCULAR | Status: AC
  Administered 2014-07-06: 1000 ug via INTRAMUSCULAR

## 2014-07-06 NOTE — Progress Notes (Signed)
Oncology Nurse Navigator Documentation  Oncology Nurse Navigator Flowsheets 07/06/2014  Navigator Encounter Type 3 month  Patient Visit Type Follow-up  Treatment Phase Other  Barriers/Navigation Needs Education.  Information explained about treatment.  Education Newly Diagnosed Cancer Education  Education Method Verbal  Time Spent with Patient 15   Patient stated he was having trouble breathing at times. I asked if his pleur x cath was being drained.  He stated yes but could not give me information on how much  Was being drained and how often.  I called [facility] and spoke with nurse.  She stated catheter is draining well and only getting 25 to 50 cc each draining.  I also updated nurse that patient states he is SOB at times.

## 2014-07-06 NOTE — Telephone Encounter (Signed)
Per staff message and POF I have scheduled appts. Advised scheduler of appts. JMW  

## 2014-07-06 NOTE — Progress Notes (Addendum)
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  PRIOR THERAPY: Status post Pleurx catheter placement by Dr. Servando Snare on 06/05/2014.  CURRENT THERAPY: None.  INTERVAL HISTORY: Ian Gonzales 74 y.o. male returns to the clinic today for follow-up visit. The patient is currently a resident of skilled nursing facility after he was recently diagnosed with a stroke. He is currently undergoing physical therapy. He continues to complain of increasing fatigue and generally not feeling well.  He complains of having difficulty breathing. He is status post Pleurex catheter placement which is being drained at the skilled nursing facility.  He denied having any significant fever or chills, he has no nausea or vomiting. The patient denied having any significant weight loss or night sweats. He continues to have shortness breath but no significant chest pain, cough or hemoptysis. He recently had an EGD by Dr. Ardis Hughs that revealed mild gastritis but was otherwise negative. The patient is here today for evaluation and discussion of his treatment options based on the final pathology report and imaging studies.   MEDICAL HISTORY: Past Medical History  Diagnosis Date  . Hypertension   . GERD (gastroesophageal reflux disease)   . Colon polyps   . Back pain, lumbosacral   . Sinusitis acute 03/24/2011  . Memory loss 04/16/2011  . Allergic state 05/30/2011  . Leukocytosis 12/15/2011  . Hyperlipidemia 02/15/2012  . Unspecified sinusitis (chronic) 04/05/2012  . Epicondylitis 10/10/2012  . Shortness of breath     WITH EXERTION AND BACK PAIN  . Wound drainage     L ELBOW  . Spinal stenosis, lumbar   . Nocturia   .  Headache 05/05/2014  . Lung cancer     ALLERGIES:  is allergic to ibuprofen; gabapentin; and valacyclovir.  MEDICATIONS:  Current Outpatient Prescriptions  Medication Sig Dispense Refill  . acetaminophen (TYLENOL) 325 MG tablet Take 650 mg by mouth every morning.    Marland Kitchen amLODipine (NORVASC) 5 MG tablet   0  . aspirin 81 MG tablet Take 1 tablet (81 mg total) by mouth daily. Hold until 06/01/14; due to pleurex catheter placement; then resume therapy as previously indictaed 30 tablet   . atorvastatin (LIPITOR) 20 MG tablet Take 1 tablet (20 mg total) by mouth daily at 6 PM. 30 tablet 0  . clopidogrel (PLAVIX) 75 MG tablet Take 1 tablet (75 mg total) by mouth daily. 30 tablet 0  . dexamethasone (DECADRON) 4 MG tablet Take 1 tablet (4 mg total) by mouth 2 (two) times daily with a meal. The day before, the day of and the day after chemotherapy 30 tablet 0  . fluconazole (DIFLUCAN) 100 MG tablet Take 1 tablet (100 mg total) by mouth daily. 8 tablet 0  . folic acid (FOLVITE) 1 MG tablet Take 1 tablet (1 mg total) by mouth daily. 30 tablet 3  . KIONEX 15 GM/60ML suspension   0  . naproxen sodium (ANAPROX) 220 MG tablet Take 220 mg by mouth 2 (two) times daily with a meal.    . nicotine (NICODERM CQ) 14 mg/24hr patch Place 1 patch (14 mg total) onto the skin daily. 28 patch 0  . nystatin (MYCOSTATIN) 100000 UNIT/ML suspension Take 5 mLs (500,000 Units total) by mouth 4 (four) times daily.  60 mL 0  . oxyCODONE (OXY IR/ROXICODONE) 5 MG immediate release tablet Take 1 tablet (5 mg total) by mouth every 4 (four) hours as needed for severe pain. 30 tablet 0  . prochlorperazine (COMPAZINE) 10 MG tablet Take 1 tablet (10 mg total) by mouth every 6 (six) hours as needed for nausea or vomiting. 30 tablet 0  . QUEtiapine (SEROQUEL) 25 MG tablet Take 1 tablet (25 mg total) by mouth 3 (three) times daily as needed (Agitation and restlessness). 60 tablet 0  . ranitidine (ZANTAC) 300 MG tablet take 1 tablet by mouth at  bedtime 30 tablet 5  . traMADol (ULTRAM) 50 MG tablet Take 1 tablet (50 mg total) by mouth every 8 (eight) hours as needed for severe pain. 30 tablet 0   No current facility-administered medications for this visit.    SURGICAL HISTORY:  Past Surgical History  Procedure Laterality Date  . Lumbar laminectomy  1974    twice  . Urethral stricture dilatation    . Colonoscopy    . Penile prosthesis implant    . Cataracts      REMOVED  . Decompressive lumbar laminectomy level 2 N/A 11/26/2012    Procedure: CENTRAL DECOMPRESSION LUMBAR LAMINECTOMY L3-L4,  L4-L5   ;  Surgeon: Tobi Bastos, MD;  Location: [facility];  Service: Orthopedics;  Laterality: N/A;  . Chest tube insertion Left 05/31/2014    Procedure: INSERTION OF LEFT PLEURAL DRAINAGE CATHETER;  Surgeon: Grace Isaac, MD;  Location: [facility];  Service: Thoracic;  Laterality: Left;  . Pleural effusion drainage Left 05/31/2014    Procedure: DRAINAGE OF LEFT PLEURAL EFFUSION;  Surgeon: Grace Isaac, MD;  Location: [facility];  Service: Thoracic;  Laterality: Left;  . Esophagogastroduodenoscopy (egd) with propofol N/A 06/29/2014    Procedure: ESOPHAGOGASTRODUODENOSCOPY (EGD) WITH PROPOFOL;  Surgeon: Milus Banister, MD;  Location: [facility];  Service: Endoscopy;  Laterality: N/A;    REVIEW OF SYSTEMS:  Constitutional: positive for fatigue Eyes: negative Ears, nose, mouth, throat, and face: negative Respiratory: positive for dyspnea on exertion Cardiovascular: negative Gastrointestinal: negative Genitourinary:negative Integument/breast: negative Hematologic/lymphatic: negative Musculoskeletal:negative Neurological: negative Behavioral/Psych: negative Endocrine: negative Allergic/Immunologic: negative   PHYSICAL EXAMINATION: General appearance: alert, cooperative, fatigued and no distress Head: Normocephalic, without obvious abnormality, atraumatic Neck: no adenopathy, no JVD, supple, symmetrical, trachea midline and thyroid  not enlarged, symmetric, no tenderness/mass/nodules Lymph nodes: Cervical, supraclavicular, and axillary nodes normal. Resp: clear to auscultation bilaterally Back: symmetric, no curvature. ROM normal. No CVA tenderness. Cardio: regular rate and rhythm, S1, S2 normal, no murmur, click, rub or gallop GI: soft, non-tender; bowel sounds normal; no masses,  no organomegaly Extremities: extremities normal, atraumatic, no cyanosis or edema Neurologic: Alert and oriented X 3, normal strength and tone. Normal symmetric reflexes. Normal coordination and gait  ECOG PERFORMANCE STATUS: 2 - Symptomatic, <50% confined to bed  Blood pressure 112/72, pulse 108, temperature 97.9 F (36.6 C), temperature source Oral, resp. rate 24, height '5\' 9"'$  (1.753 m), weight 163 lb 6.4 oz (74.118 kg), SpO2 93 %.  LABORATORY DATA: Lab Results  Component Value Date   WBC 16.6* 07/06/2014   HGB 9.9* 07/06/2014   HCT 30.0* 07/06/2014   MCV 88.8 07/06/2014   PLT 446* 07/06/2014      Chemistry      Component Value Date/Time   NA 138 07/06/2014 1132   NA 136 06/15/2014 0822   K 3.7 07/06/2014 1132   K 3.5 06/15/2014 0822   CL 103 06/15/2014  0822   CO2 23 07/06/2014 1132   CO2 21* 06/15/2014 0822   BUN 18.2 07/06/2014 1132   BUN 19 06/15/2014 0822   CREATININE 0.8 07/06/2014 1132   CREATININE 0.80 06/15/2014 0822   CREATININE 0.77 05/04/2014 1458      Component Value Date/Time   CALCIUM 9.3 07/06/2014 1132   CALCIUM 8.9 06/15/2014 0822   ALKPHOS 92 07/06/2014 1132   ALKPHOS 83 06/15/2014 0822   AST 16 07/06/2014 1132   AST 17 06/15/2014 0822   ALT 16 07/06/2014 1132   ALT 17 06/15/2014 0822   BILITOT 0.35 07/06/2014 1132   BILITOT 0.6 06/15/2014 0822       RADIOGRAPHIC STUDIES: Ct Angio Head W/cm &/or Wo Cm  06/12/2014   CLINICAL DATA:  Bilateral carotid artery stenosis. Dominant left brain infarct with right hemi paresis and hemi sensory loss. He is  EXAM: CT ANGIOGRAPHY HEAD AND NECK  TECHNIQUE:  Multidetector CT imaging of the head and neck was performed using the standard protocol during bolus administration of intravenous contrast. Multiplanar CT image reconstructions and MIPs were obtained to evaluate the vascular anatomy. Carotid stenosis measurements (when applicable) are obtained utilizing NASCET criteria, using the distal internal carotid diameter as the denominator.  CONTRAST:  110m OMNIPAQUE IOHEXOL 350 MG/ML SOLN  COMPARISON:  CT head without contrast 06/11/2014.  FINDINGS: CT HEAD  Brain: A a left parietal and possibly posterior left frontal lobe infarct is better visualized with loss of gray-white differentiation and sulcal effacement on today's study. There is no significant mass effect to generate ventricular effacement or midline shift. Scattered subcortical white matter hypoattenuation is present throughout.  Calvarium and skull base: Negative  Paranasal sinuses: Clear  Orbits: The globes and orbits are intact.  CTA NECK  Aortic arch: Atherosclerotic irregularity calcifications are present within the aortic arch. A 3 vessel configuration is present. Atherosclerotic calcifications are present at the origins. There is mild narrowing of the proximal left common carotid artery without a significant stenosis relative to the more distal vessel.  Right carotid system: The right common carotid artery demonstrates mild atherosclerotic change without a significant stenosis. There is significant calcified and noncalcified plaque at the bifurcation. The lumen of the right internal carotid artery is narrowed to 1.4 mm. This compares with a more normal distal vessel measurement of 4.5 mm. There no tandem stenoses in the neck.  Left carotid system: More prominent atherosclerotic changes are present in the left common carotid artery without a significant stenosis. The left internal carotid artery is occluded 5 mm from the bifurcation. There is minimal contrast in the left ICA at the skullbase. There is no  reconstitution in the neck.  Vertebral arteries:Atherosclerotic changes are present at the origins of the vertebral arteries bilaterally. The vessels originate from the subclavian arteries. The left vertebral artery is the dominant vessel. It is tortuous with some calcifications but no significant stenosis. The right vertebral artery is hypoplastic but without focal stenosis.  Skeleton: Heart degenerative changes are present within cervical spine, most evident at C5-6 with uncovertebral spurring and endplate changes. Endplate changes are also noted at C6-7. There is slight degenerative anterolisthesis at C3-4 and C4-5. No focal lytic or blastic lesions are evident. The patient is edentulous.  Other neck: A soft tissue mass lesion is noted just lateral to the sternocleidomastoid and posterior to the vertical ramus of mandible measuring 4.2 x 1.8 x 2.2 cm. There is a central calcification. Asymmetric soft tissue is present in the right posterolateral  hypopharynx.  There is marked irregularity of the pleura compatible with known metastatic disease on the left. A posterior rounded soft tissue density may represent rounded atelectasis versus metastatic disease or loculated fluid. This was not hypermetabolic on the recent PET scan. Paraseptal emphysematous changes are evident.  CTA HEAD  Anterior circulation: Atherosclerotic changes are present within the cavernous right internal carotid artery there is no scratch the the lumen is narrowed but without a significant stenosis relative to the more distal vessel. The terminal right ICA is intact. The left ICA demonstrates some contrast beginning at the skullbase extending through the cavernous segment. The terminal left ICA is small. The A1 and M1 segments are visualized bilaterally. There is some attenuation of the vessels without a focal stenosis. a.m. MCA branch vessels are moderately attenuated bilaterally, more prominently on the left. The posterior left M2 branches are  occluded. Moderate medium and distal ACA branch vessel stenoses are noted.  Posterior circulation: The a left vertebral artery is the dominant vessel. The vertebrobasilar junction is intact. Both posterior cerebral arteries originate from the basilar tip. A PCA branch vessels demonstrate moderate attenuation.  Venous sinuses: The dural sinuses are patent. Straight sinus is patent. The deep veins are patent.  IMPRESSION: 1. Occlusion of the left internal carotid artery with reconstituted flow at the skullbase. 2. 70% stenosis of the right internal carotid artery just beyond the bifurcation. 3. Atherosclerotic changes at the arch without significant proximal stenosis. 4. Atherosclerotic changes in the cavernous right internal carotid artery with tapering of the vessel but no significant stenosis relative to the more distal vessels. 5. Asymmetric attenuation of MCA branch vessels on the left corresponding with the area of acute/subacute nonhemorrhagic infarction within the left parietal and probably posterior left frontal lobe. 6. Moderate diffuse medium and small vessel disease. 7. 4.2 cm soft tissue mass anterior and superior to the right sternocleidomastoid muscle. This could be related to the tail of the parotid, representing a parotid tumor. Metastatic disease is also considered. 8. Nodular thickening of the pleura in the left chest compatible with known metastatic disease. 9. Loculated fluid versus rounded atelectasis or tumor in the left lung is stable. 10. Asymmetric soft tissue in the posterior right hypopharynx is concerning for tumor. This area should be amenable to evaluation.   Electronically Signed   By: San Morelle M.D.   On: 06/12/2014 16:46   Ct Head (brain) Wo Contrast  06/11/2014   CLINICAL DATA:  Right-sided weakness, acute onset  EXAM: CT HEAD WITHOUT CONTRAST  TECHNIQUE: Contiguous axial images were obtained from the base of the skull through the vertex without intravenous contrast.   COMPARISON:  PET-CT scan 428 1,016, head CT 05/04/2014  FINDINGS: No acute intracranial hemorrhage. No focal mass lesion. No CT evidence of acute infarction. No midline shift or mass effect. No hydrocephalus. Basilar cisterns are patent. Mild subcortical white matter hypodensities. Paranasal sinuses and mastoid air cells are clear.  IMPRESSION: 1. No CT evidence of acute infarction. 2. No intracranial hemorrhage. 3. Scattered subcortical white matter hypodensities consistent with microvascular disease. Findings conveyed toKirkpatrick, MDon 06/11/2014  at23:54.   Electronically Signed   By: Suzy Bouchard M.D.   On: 06/11/2014 23:55   Ct Angio Neck W/cm &/or Wo/cm  06/12/2014   CLINICAL DATA:  Bilateral carotid artery stenosis. Dominant left brain infarct with right hemi paresis and hemi sensory loss. He is  EXAM: CT ANGIOGRAPHY HEAD AND NECK  TECHNIQUE: Multidetector CT imaging of the head and neck  was performed using the standard protocol during bolus administration of intravenous contrast. Multiplanar CT image reconstructions and MIPs were obtained to evaluate the vascular anatomy. Carotid stenosis measurements (when applicable) are obtained utilizing NASCET criteria, using the distal internal carotid diameter as the denominator.  CONTRAST:  168m OMNIPAQUE IOHEXOL 350 MG/ML SOLN  COMPARISON:  CT head without contrast 06/11/2014.  FINDINGS: CT HEAD  Brain: A a left parietal and possibly posterior left frontal lobe infarct is better visualized with loss of gray-white differentiation and sulcal effacement on today's study. There is no significant mass effect to generate ventricular effacement or midline shift. Scattered subcortical white matter hypoattenuation is present throughout.  Calvarium and skull base: Negative  Paranasal sinuses: Clear  Orbits: The globes and orbits are intact.  CTA NECK  Aortic arch: Atherosclerotic irregularity calcifications are present within the aortic arch. A 3 vessel configuration  is present. Atherosclerotic calcifications are present at the origins. There is mild narrowing of the proximal left common carotid artery without a significant stenosis relative to the more distal vessel.  Right carotid system: The right common carotid artery demonstrates mild atherosclerotic change without a significant stenosis. There is significant calcified and noncalcified plaque at the bifurcation. The lumen of the right internal carotid artery is narrowed to 1.4 mm. This compares with a more normal distal vessel measurement of 4.5 mm. There no tandem stenoses in the neck.  Left carotid system: More prominent atherosclerotic changes are present in the left common carotid artery without a significant stenosis. The left internal carotid artery is occluded 5 mm from the bifurcation. There is minimal contrast in the left ICA at the skullbase. There is no reconstitution in the neck.  Vertebral arteries:Atherosclerotic changes are present at the origins of the vertebral arteries bilaterally. The vessels originate from the subclavian arteries. The left vertebral artery is the dominant vessel. It is tortuous with some calcifications but no significant stenosis. The right vertebral artery is hypoplastic but without focal stenosis.  Skeleton: Heart degenerative changes are present within cervical spine, most evident at C5-6 with uncovertebral spurring and endplate changes. Endplate changes are also noted at C6-7. There is slight degenerative anterolisthesis at C3-4 and C4-5. No focal lytic or blastic lesions are evident. The patient is edentulous.  Other neck: A soft tissue mass lesion is noted just lateral to the sternocleidomastoid and posterior to the vertical ramus of mandible measuring 4.2 x 1.8 x 2.2 cm. There is a central calcification. Asymmetric soft tissue is present in the right posterolateral hypopharynx.  There is marked irregularity of the pleura compatible with known metastatic disease on the left. A  posterior rounded soft tissue density may represent rounded atelectasis versus metastatic disease or loculated fluid. This was not hypermetabolic on the recent PET scan. Paraseptal emphysematous changes are evident.  CTA HEAD  Anterior circulation: Atherosclerotic changes are present within the cavernous right internal carotid artery there is no scratch the the lumen is narrowed but without a significant stenosis relative to the more distal vessel. The terminal right ICA is intact. The left ICA demonstrates some contrast beginning at the skullbase extending through the cavernous segment. The terminal left ICA is small. The A1 and M1 segments are visualized bilaterally. There is some attenuation of the vessels without a focal stenosis. a.m. MCA branch vessels are moderately attenuated bilaterally, more prominently on the left. The posterior left M2 branches are occluded. Moderate medium and distal ACA branch vessel stenoses are noted.  Posterior circulation: The a left vertebral artery  is the dominant vessel. The vertebrobasilar junction is intact. Both posterior cerebral arteries originate from the basilar tip. A PCA branch vessels demonstrate moderate attenuation.  Venous sinuses: The dural sinuses are patent. Straight sinus is patent. The deep veins are patent.  IMPRESSION: 1. Occlusion of the left internal carotid artery with reconstituted flow at the skullbase. 2. 70% stenosis of the right internal carotid artery just beyond the bifurcation. 3. Atherosclerotic changes at the arch without significant proximal stenosis. 4. Atherosclerotic changes in the cavernous right internal carotid artery with tapering of the vessel but no significant stenosis relative to the more distal vessels. 5. Asymmetric attenuation of MCA branch vessels on the left corresponding with the area of acute/subacute nonhemorrhagic infarction within the left parietal and probably posterior left frontal lobe. 6. Moderate diffuse medium and  small vessel disease. 7. 4.2 cm soft tissue mass anterior and superior to the right sternocleidomastoid muscle. This could be related to the tail of the parotid, representing a parotid tumor. Metastatic disease is also considered. 8. Nodular thickening of the pleura in the left chest compatible with known metastatic disease. 9. Loculated fluid versus rounded atelectasis or tumor in the left lung is stable. 10. Asymmetric soft tissue in the posterior right hypopharynx is concerning for tumor. This area should be amenable to evaluation.   Electronically Signed   By: San Morelle M.D.   On: 06/12/2014 16:46   Mr Jodene Nam Head Wo Contrast  06/13/2014   CLINICAL DATA:  Acute onset of right-sided weakness and speech difficulty. Lung mass with probable lung cancer awaiting biopsy.  EXAM: MRI HEAD WITHOUT CONTRAST  MRA HEAD WITHOUT CONTRAST  TECHNIQUE: Multiplanar, multiecho pulse sequences of the brain and surrounding structures were obtained without intravenous contrast. Angiographic images of the head were obtained using MRA technique without contrast.  COMPARISON:  CTA head and neck 06/12/2014  FINDINGS: MRI HEAD FINDINGS  Acute infarct left parietal lobe extending down to the posterior insula. This measures approximately 4 x 4 cm. Small area of acute infarct in the head of the caudate on the left. Small area of acute infarct in the right frontal white matter and over the convexity of the right frontal lobe. These areas measure several mm each.  Cerebral volume normal for age. Mild chronic microvascular ischemic change in the white matter and pons. Chronic micro hemorrhage left pons.  Negative for mass lesion.  No edema.  Postcontrast imaging reveals normal enhancement. No evidence of metastatic disease.  Paranasal sinuses show minimal mucosal edema.  MRA HEAD FINDINGS  Right carotid is widely patent with mild disease in the cavernous carotid. Right anterior and middle cerebral arteries are patent. Mild  atherosclerotic disease in right MCA branches without significant stenosis.  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 Left anterior cerebral artery patent. Left middle cerebral artery shows decreased caliber due to decreased flow. Moderate stenosis left M1 segment. Occluded branch to the left parietal lobe corresponding to the acute infarct.  Basilar artery widely patent. Both vertebral arteries are widely patent. Left PICA patent. Right PICA not definitely visualized. Superior cerebellar and posterior cerebral arteries patent bilaterally without stenosis.  Negative for cerebral aneurysm.  IMPRESSION: Acute infarct left parietal lobe. Additional small areas of acute infarct involving the left caudate head, right frontal white matter and right frontal cortex over the convexity.  Negative for metastatic disease to the brain  Occluded left internal carotid artery with reconstitution of the  supra clinoid segment. Moderate stenosis left M1 segment and occluded branch to the left parietal lobe.   Electronically Signed   By: Franchot Gallo M.D.   On: 06/13/2014 17:06   Mr Jeri Cos EL Contrast  06/13/2014   CLINICAL DATA:  Acute onset of right-sided weakness and speech difficulty. Lung mass with probable lung cancer awaiting biopsy.  EXAM: MRI HEAD WITHOUT CONTRAST  MRA HEAD WITHOUT CONTRAST  TECHNIQUE: Multiplanar, multiecho pulse sequences of the brain and surrounding structures were obtained without intravenous contrast. Angiographic images of the head were obtained using MRA technique without contrast.  COMPARISON:  CTA head and neck 06/12/2014  FINDINGS: MRI HEAD FINDINGS  Acute infarct left parietal lobe extending down to the posterior insula. This measures approximately 4 x 4 cm. Small area of acute infarct in the head of the caudate on the left. Small area of acute infarct in the right frontal white matter and over the convexity of  the right frontal lobe. These areas measure several mm each.  Cerebral volume normal for age. Mild chronic microvascular ischemic change in the white matter and pons. Chronic micro hemorrhage left pons.  Negative for mass lesion.  No edema.  Postcontrast imaging reveals normal enhancement. No evidence of metastatic disease.  Paranasal sinuses show minimal mucosal edema.  MRA HEAD FINDINGS  Right carotid is widely patent with mild disease in the cavernous carotid. Right anterior and middle cerebral arteries are patent. Mild atherosclerotic disease in right MCA branches without significant stenosis.  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 Left anterior cerebral artery patent. Left middle cerebral artery shows decreased caliber due to decreased flow. Moderate stenosis left M1 segment. Occluded branch to the left parietal lobe corresponding to the acute infarct.  Basilar artery widely patent. Both vertebral arteries are widely patent. Left PICA patent. Right PICA not definitely visualized. Superior cerebellar and posterior cerebral arteries patent bilaterally without stenosis.  Negative for cerebral aneurysm.  IMPRESSION: Acute infarct left parietal lobe. Additional small areas of acute infarct involving the left caudate head, right frontal white matter and right frontal cortex over the convexity.  Negative for metastatic disease to the brain  Occluded left internal carotid artery with reconstitution of the supra clinoid segment. Moderate stenosis left M1 segment and occluded branch to the left parietal lobe.   Electronically Signed   By: Franchot Gallo M.D.   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 Recent left chest tube placement. Shortness of breath.  EXAM: NUCLEAR MEDICINE PET SKULL BASE TO THIGH  TECHNIQUE:  9.7 mCi F-18 FDG was injected intravenously. Full-ring PET imaging was performed from the skull base to thigh after the radiotracer. CT data was obtained and used for attenuation correction and anatomic localization.  FASTING BLOOD GLUCOSE:  Value: 109 mg/dl  COMPARISON:  CT chest dated 05/04/2014  FINDINGS: NECK  1.8 cm partially calcified right level II cervical node (series 4/ image 27), max SUV 16.2.  Mild associated hypermetabolism in the right posterior hypopharynx, max SUV 7.5, although without definite mass on CT.  CHEST  Moderate left pleural effusion, partially loculated, with associated indwelling pleural catheter.  Multifocal pleural-based hypermetabolism, max SUV 7.5 along the medial left heart border and 10.9 along the medial left lower lobe/infrahilar region. Associated lingular and left lower lobe atelectasis/collapse.  Additional  pleural-based nodularity along the left fissure (series 6/ image 48), measuring up to 11 mm, max SUV 5.1.  Biapical pleural parenchymal scarring. Underlying mild to moderate paraseptal emphysematous changes.  Right lungs otherwise clear.  No pneumothorax.  ABDOMEN/PELVIS  No abnormal hypermetabolic activity within the liver, pancreas, or spleen.  Low-density nodular thickening of the left adrenal gland, measuring -8 HUs on unenhanced CT, compatible with a benign adrenal adenoma. Associated mild hypermetabolism, max SUV 4.1.  No hypermetabolic lymph nodes in the abdomen or pelvis.  SKELETON  Multifocal osseous metastases, including:  --Left T5 vertebral body, max SUV 6.7  --Left T7 vertebral body, max SUV 8.1  Mild focal hypermetabolism in the left posterior paraspinous musculature, max SUV 5.0 (PET image 57).  IMPRESSION: Moderate loculated left pleural effusion with indwelling pleural catheter.  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  Focal hypermetabolism in the medial left lower  lobe/infrahilar region could reflect the primary lesion, although this is poorly evaluated.  Osseous metastases, as above. Intramuscular metastasis in the left posterior back.  Hypermetabolic, partially calcified right level II cervical node. Metastasis is not excluded, although this appearance/distribution is unusual. Consider biopsy and/or surgical excision.  Associated asymmetric hypermetabolism along the posterior right hypopharynx, without definite mass on CT. Consider visual inspection to exclude primary head/neck cancer.   Electronically Signed   By: Julian Hy M.D.   On: 06/08/2014 16:20   Ct Biopsy  06/15/2014   CLINICAL DATA:  74 year old with a loculated left malignant pleural effusion. Previous PET-CT demonstrated hypermetabolic activity throughout the left pleura. A pleural biopsy has been requested.  EXAM: CT-GUIDED BIOPSY OF LEFT PLEURAL/INTERCOSTAL TISSUE.  Physician: Stephan Minister. Anselm Pancoast, MD  MEDICATIONS: 0.5 mg versed, 25 mcg fentanyl. A radiology nurse monitored the patient for moderate sedation.  ANESTHESIA/SEDATION: Sedation time: 15 minutes  PROCEDURE: The procedure was explained to the patient. The risks and benefits of the procedure were discussed and the patient's questions were addressed. Informed consent was obtained from the patient. CT images through the lower chest were obtained. An area of soft tissue thickening along the intercostal/pleural surface was targeted in the anterior left lower chest. This corresponds with an area of hypermetabolic activity on the prior PET-CT. The left lower chest was prepped and draped in sterile fashion. Skin was anesthetized with 1% lidocaine. Using CT guidance, 17 gauge needle was directed towards the soft tissue thickening. Needle position was confirmed within the targeted tissue and a total of 4 core biopsies were obtained with an 18 gauge device. 17 gauge needle was removed. Bandage placed over the puncture site.  FINDINGS: Soft tissue thickening  along the anterior left lower chest in the intercostal/pleural space. There is adjacent pleural fluid and the patient has a PleurX catheter. The biopsied area corresponds with hypermetabolic activity from the prior PET-CT.  Estimated blood loss: Minimal  COMPLICATIONS: None  IMPRESSION: CT-guided core biopsies of left lower chest pleural/intercostal tissue.   Electronically Signed   By: Markus Daft M.D.   On: 06/15/2014 17:25   Dg Chest Port 1 View  06/12/2014   CLINICAL DATA:  Altered mental status  EXAM: PORTABLE CHEST - 1 VIEW  COMPARISON:  06/08/2014, 05/31/2014  FINDINGS: The left pleural effusion and loculated pleural collections persist, probably unchanged from the PET examination of 06/08/2014. The right lung is clear. Pulmonary vasculature is normal. There is mild unchanged aortic tortuosity. Heart size is upper normal, unchanged.  IMPRESSION: Left effusion and loculated pleural  collections, as observed on the recent PET examination. Right lung is clear.   Electronically Signed   By: Andreas Newport M.D.   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 The patient was discussed with and also seen by Dr. Julien Nordmann. The plan is to proceed with systemic chemotherapy with Carboplatin AUC 5 and Alimta 500 mg/m2 given every 3 weeks. Side effects of chemotherapy including but not limited to nausea, vomiting, and low blood counts were reviewed with the patients. He desires to proceed with chemotherapy. He was given a B12 injection today. Prescriptions were printed for folic acid, dexamethasone and compazine to be administered at the skilled nursing facility.  He will follow up one week after his first cycle of chemotherapy for re-evaluation. In the  interim he will be scheduled for chemotherapy class.  For the recent stroke and weakness, the patient will continue with physical therapy at the skilled nursing facility. The patient was advised to call immediately if he has any concerning symptoms in the interval. The patient voices understanding of current disease status and treatment options and is in agreement with the current care plan.  All questions were answered. The patient knows to call the clinic with any problems, questions or concerns. We can certainly see the patient much sooner if necessary.  Carlton Adam, PA-C 07/06/2014  ADDENDUM: Hematology/Oncology Attending: I had a face to face encounter with the patient. I recommended his care plan. This is a very pleasant 74 years old white male with metastatic adenocarcinoma of unknown primary highly suspicious for static lung cancer. The patient underwent upper endoscopy to rule out upper gastrointestinal primary and it was unremarkable for any malignancy. He is here today for evaluation and discussion of his treatment options. The patient continues to have shortness of breath as well as fatigue. He was also recently diagnosed with a stroke with residual weakness and he is currently undergoing physical therapy at the skilled nursing facility. I had a lengthy discussion with the patient about his condition. I discussed with him the option of palliative care and hospice referral versus proceeding with systemic chemotherapy with carboplatin for AUC of 5 on day 1 and Alimta 500 MG/M2 every 3 weeks. I discussed with the patient the adverse effect of the chemotherapy and he would like to proceed with treatment soon. He will receive vitamin B 12 injection today. We will also call his pharmacy with prescription for Compazine, folic acid and Decadron. He would come back for follow-up visit in 2 weeks for reevaluation and management any adverse effect of his treatment. The patient was advised to  call immediately if he has any concerning symptoms in the interval.  Disclaimer: This note was dictated with voice recognition software. Similar sounding words can inadvertently be transcribed and may be missed upon review. Eilleen Kempf., MD 07/11/2014

## 2014-07-10 NOTE — Patient Instructions (Addendum)
Continue with labs and chemotherapy as scheduled Follow up one week after your first cycle of chemotherapy

## 2014-07-12 ENCOUNTER — Telehealth: Payer: Self-pay | Admitting: Internal Medicine

## 2014-07-12 ENCOUNTER — Other Ambulatory Visit: Payer: Medicare Other

## 2014-07-12 NOTE — Telephone Encounter (Signed)
[facility] called from [phone] and left a message to reschedule his chemo class,i have called and left a message for them to return my call

## 2014-07-13 ENCOUNTER — Other Ambulatory Visit: Payer: Medicare Other

## 2014-07-17 ENCOUNTER — Other Ambulatory Visit: Payer: Medicare Other

## 2014-07-17 ENCOUNTER — Ambulatory Visit: Payer: Medicare Other

## 2014-07-17 ENCOUNTER — Telehealth: Payer: Self-pay | Admitting: Internal Medicine

## 2014-07-17 NOTE — Telephone Encounter (Signed)
Pt rehab facility called to r/s missed appt...done they are aware of appts

## 2014-07-19 ENCOUNTER — Other Ambulatory Visit: Payer: Medicare Other

## 2014-07-19 ENCOUNTER — Ambulatory Visit (HOSPITAL_BASED_OUTPATIENT_CLINIC_OR_DEPARTMENT_OTHER): Payer: Medicare Other

## 2014-07-19 ENCOUNTER — Other Ambulatory Visit (HOSPITAL_BASED_OUTPATIENT_CLINIC_OR_DEPARTMENT_OTHER): Payer: Medicare Other

## 2014-07-19 ENCOUNTER — Encounter: Payer: Self-pay | Admitting: *Deleted

## 2014-07-19 VITALS — BP 124/56 | HR 68 | Temp 98.4°F | Resp 20

## 2014-07-19 DIAGNOSIS — C782 Secondary malignant neoplasm of pleura: Secondary | ICD-10-CM

## 2014-07-19 DIAGNOSIS — C3492 Malignant neoplasm of unspecified part of left bronchus or lung: Secondary | ICD-10-CM

## 2014-07-19 DIAGNOSIS — C7951 Secondary malignant neoplasm of bone: Secondary | ICD-10-CM

## 2014-07-19 DIAGNOSIS — C801 Malignant (primary) neoplasm, unspecified: Secondary | ICD-10-CM

## 2014-07-19 DIAGNOSIS — Z5111 Encounter for antineoplastic chemotherapy: Secondary | ICD-10-CM | POA: Diagnosis present

## 2014-07-19 LAB — CBC WITH DIFFERENTIAL/PLATELET
BASO%: 0.5 % (ref 0.0–2.0)
Basophils Absolute: 0.1 10*3/uL (ref 0.0–0.1)
EOS%: 0.6 % (ref 0.0–7.0)
Eosinophils Absolute: 0.1 10*3/uL (ref 0.0–0.5)
HEMATOCRIT: 30.3 % — AB (ref 38.4–49.9)
HGB: 9.9 g/dL — ABNORMAL LOW (ref 13.0–17.1)
LYMPH#: 1.2 10*3/uL (ref 0.9–3.3)
LYMPH%: 7.2 % — ABNORMAL LOW (ref 14.0–49.0)
MCH: 28.3 pg (ref 27.2–33.4)
MCHC: 32.7 g/dL (ref 32.0–36.0)
MCV: 86.6 fL (ref 79.3–98.0)
MONO#: 1.4 10*3/uL — ABNORMAL HIGH (ref 0.1–0.9)
MONO%: 8.5 % (ref 0.0–14.0)
NEUT%: 83.2 % — ABNORMAL HIGH (ref 39.0–75.0)
NEUTROS ABS: 14 10*3/uL — AB (ref 1.5–6.5)
Platelets: 467 10*3/uL — ABNORMAL HIGH (ref 140–400)
RBC: 3.5 10*6/uL — AB (ref 4.20–5.82)
RDW: 13.4 % (ref 11.0–14.6)
WBC: 16.8 10*3/uL — AB (ref 4.0–10.3)

## 2014-07-19 LAB — COMPREHENSIVE METABOLIC PANEL (CC13)
ALT: 19 U/L (ref 0–55)
ANION GAP: 11 meq/L (ref 3–11)
AST: 21 U/L (ref 5–34)
Albumin: 2.7 g/dL — ABNORMAL LOW (ref 3.5–5.0)
Alkaline Phosphatase: 107 U/L (ref 40–150)
BILIRUBIN TOTAL: 0.51 mg/dL (ref 0.20–1.20)
BUN: 16 mg/dL (ref 7.0–26.0)
CALCIUM: 9.1 mg/dL (ref 8.4–10.4)
CO2: 23 meq/L (ref 22–29)
Chloride: 100 mEq/L (ref 98–109)
Creatinine: 0.8 mg/dL (ref 0.7–1.3)
EGFR: 86 mL/min/{1.73_m2} — AB (ref >90)
Glucose: 96 mg/dl (ref 70–140)
Potassium: 3.5 mEq/L (ref 3.5–5.1)
SODIUM: 135 meq/L — AB (ref 136–145)
Total Protein: 6.9 g/dL (ref 6.4–8.3)

## 2014-07-19 MED ORDER — SODIUM CHLORIDE 0.9 % IV SOLN
500.0000 mg/m2 | Freq: Once | INTRAVENOUS | Status: AC
  Administered 2014-07-19: 950 mg via INTRAVENOUS
  Filled 2014-07-19: qty 38

## 2014-07-19 MED ORDER — DEXAMETHASONE SODIUM PHOSPHATE 100 MG/10ML IJ SOLN
Freq: Once | INTRAMUSCULAR | Status: AC
  Administered 2014-07-19: 13:00:00 via INTRAVENOUS
  Filled 2014-07-19: qty 8

## 2014-07-19 MED ORDER — SODIUM CHLORIDE 0.9 % IV SOLN
Freq: Once | INTRAVENOUS | Status: AC
  Administered 2014-07-19: 12:00:00 via INTRAVENOUS

## 2014-07-19 MED ORDER — SODIUM CHLORIDE 0.9 % IV SOLN
454.5000 mg | Freq: Once | INTRAVENOUS | Status: AC
  Administered 2014-07-19: 450 mg via INTRAVENOUS
  Filled 2014-07-19: qty 45

## 2014-07-19 NOTE — Progress Notes (Signed)
Oncology Nurse Navigator Documentation  Oncology Nurse Navigator Flowsheets 07/19/2014  Navigator Encounter Type Treatment  Patient Visit Type Follow-up  Treatment Phase First Chemo Tx/Spoke with patient today at 1st chemotherapy tx.  He has someone with him today.  He states he is doing well without complants  Barriers/Navigation Needs Education/Verbal/Due to recent stroke, I did limited teaching regarding side effects from tx.    Education   Education Method   Time Spent with Patient 15

## 2014-07-19 NOTE — Patient Instructions (Signed)
[facility] Discharge Instructions for Patients Receiving Chemotherapy  Today you received the following chemotherapy agents Alimta, Carboplatin  To help prevent nausea and vomiting after your treatment, we encourage you to take your nausea medication as directed   If you develop nausea and vomiting that is not controlled by your nausea medication, call the clinic.   BELOW ARE SYMPTOMS THAT SHOULD BE REPORTED IMMEDIATELY:  *FEVER GREATER THAN 100.5 F  *CHILLS WITH OR WITHOUT FEVER  NAUSEA AND VOMITING THAT IS NOT CONTROLLED WITH YOUR NAUSEA MEDICATION  *UNUSUAL SHORTNESS OF BREATH  *UNUSUAL BRUISING OR BLEEDING  TENDERNESS IN MOUTH AND THROAT WITH OR WITHOUT PRESENCE OF ULCERS  *URINARY PROBLEMS  *BOWEL PROBLEMS  UNUSUAL RASH Items with * indicate a potential emergency and should be followed up as soon as possible.  Feel free to call the clinic you have any questions or concerns. The clinic phone number is [phone].  Please show the [facility] at check-in to the Emergency Department and triage nurse.

## 2014-07-24 ENCOUNTER — Telehealth: Payer: Self-pay | Admitting: Medical Oncology

## 2014-07-24 ENCOUNTER — Telehealth: Payer: Self-pay | Admitting: Internal Medicine

## 2014-07-24 ENCOUNTER — Ambulatory Visit (HOSPITAL_BASED_OUTPATIENT_CLINIC_OR_DEPARTMENT_OTHER): Payer: Medicare Other | Admitting: Physician Assistant

## 2014-07-24 ENCOUNTER — Ambulatory Visit: Payer: Medicare Other | Admitting: Oncology

## 2014-07-24 ENCOUNTER — Other Ambulatory Visit (HOSPITAL_BASED_OUTPATIENT_CLINIC_OR_DEPARTMENT_OTHER): Payer: Medicare Other

## 2014-07-24 ENCOUNTER — Encounter: Payer: Self-pay | Admitting: Physician Assistant

## 2014-07-24 ENCOUNTER — Other Ambulatory Visit: Payer: Medicare Other

## 2014-07-24 VITALS — BP 88/43 | HR 80 | Temp 97.6°F | Resp 18 | Ht 69.0 in | Wt 151.9 lb

## 2014-07-24 DIAGNOSIS — R531 Weakness: Secondary | ICD-10-CM

## 2014-07-24 DIAGNOSIS — C3492 Malignant neoplasm of unspecified part of left bronchus or lung: Secondary | ICD-10-CM

## 2014-07-24 DIAGNOSIS — C801 Malignant (primary) neoplasm, unspecified: Secondary | ICD-10-CM

## 2014-07-24 DIAGNOSIS — I639 Cerebral infarction, unspecified: Secondary | ICD-10-CM

## 2014-07-24 DIAGNOSIS — R5383 Other fatigue: Secondary | ICD-10-CM

## 2014-07-24 DIAGNOSIS — C799 Secondary malignant neoplasm of unspecified site: Secondary | ICD-10-CM

## 2014-07-24 LAB — COMPREHENSIVE METABOLIC PANEL (CC13)
ALT: 26 U/L (ref 0–55)
AST: 18 U/L (ref 5–34)
Albumin: 2.9 g/dL — ABNORMAL LOW (ref 3.5–5.0)
Alkaline Phosphatase: 95 U/L (ref 40–150)
Anion Gap: 13 mEq/L — ABNORMAL HIGH (ref 3–11)
BUN: 28.5 mg/dL — AB (ref 7.0–26.0)
CO2: 21 mEq/L — ABNORMAL LOW (ref 22–29)
Calcium: 9.3 mg/dL (ref 8.4–10.4)
Chloride: 102 mEq/L (ref 98–109)
Creatinine: 1 mg/dL (ref 0.7–1.3)
EGFR: 74 mL/min/{1.73_m2} — AB (ref >90)
Glucose: 107 mg/dl (ref 70–140)
Potassium: 4.1 mEq/L (ref 3.5–5.1)
Sodium: 136 mEq/L (ref 136–145)
Total Bilirubin: 0.65 mg/dL (ref 0.20–1.20)
Total Protein: 6.8 g/dL (ref 6.4–8.3)

## 2014-07-24 LAB — CBC WITH DIFFERENTIAL/PLATELET
BASO%: 0.5 % (ref 0.0–2.0)
Basophils Absolute: 0 10*3/uL (ref 0.0–0.1)
EOS%: 1 % (ref 0.0–7.0)
Eosinophils Absolute: 0.1 10*3/uL (ref 0.0–0.5)
HCT: 30.4 % — ABNORMAL LOW (ref 38.4–49.9)
HEMOGLOBIN: 10.2 g/dL — AB (ref 13.0–17.1)
LYMPH#: 0.9 10*3/uL (ref 0.9–3.3)
LYMPH%: 11.1 % — AB (ref 14.0–49.0)
MCH: 29 pg (ref 27.2–33.4)
MCHC: 33.4 g/dL (ref 32.0–36.0)
MCV: 86.8 fL (ref 79.3–98.0)
MONO#: 0 10*3/uL — AB (ref 0.1–0.9)
MONO%: 0.6 % (ref 0.0–14.0)
NEUT#: 7 10*3/uL — ABNORMAL HIGH (ref 1.5–6.5)
NEUT%: 86.8 % — ABNORMAL HIGH (ref 39.0–75.0)
Platelets: 468 10*3/uL — ABNORMAL HIGH (ref 140–400)
RBC: 3.51 10*6/uL — AB (ref 4.20–5.82)
RDW: 13.5 % (ref 11.0–14.6)
WBC: 8.1 10*3/uL (ref 4.0–10.3)

## 2014-07-24 MED ORDER — MAGIC MOUTHWASH: 5.0000 mL | Freq: Four times a day (QID) | ORAL | Status: AC | PRN

## 2014-07-24 NOTE — Telephone Encounter (Signed)
Gave and printed appt sched and avs for pt for June and July  °

## 2014-07-24 NOTE — Progress Notes (Addendum)
[facility] [phone]   [phone] Multidisciplinary thoracic oncology clinic OFFICE PROGRESS NOTE  [place], [name] [id number]  DIAGNOSIS: Metastatic adenocarcinoma of unknown primary questionable for lung cancer versus gastrointestinal diagnosed in May 2016.  PRIOR THERAPY: Status post Pleurx catheter placement by Dr. Servando Snare on 06/05/2014.  CURRENT THERAPY: Systemic chemotherapy with carboplatin for an AUC of 5 and Alimta at 500 mg/m given every 3 weeks. Status post 1 cycle.  INTERVAL HISTORY: Ian Gonzales 74 y.o. male returns to the clinic today for follow-up visit. The patient is currently a resident of skilled nursing facility after he was recently diagnosed with a stroke. He is currently undergoing physical therapy. He continues to complain of increasing fatigue and generally not feeling well.  He complains of having difficulty breathing. He is status post Pleurex catheter placement which is being drained at the skilled nursing facility although this frequency and amount drained in each session is not clear.  He is status post 1 cycle of systemic chemotherapy with carboplatin and Alimta. He notes increased shortness of breath with minimal exertion as well as mouth sores. He states that he his mouth feels like it's on "fire". He reports some intermittent nosebleeds that are brief in nature more so blood-tinged secretions when he blows his nose in the morning. Patient was advised to utilize nasal saline to keep his membranes moist. He also reports some abdominal pain although he reports that his bowels are moving normally. He denied having any significant fever or chills, he has no nausea or vomiting. The patient denied having any significant weight loss or night sweats. He denied significant chest pain, cough or hemoptysis.  MEDICAL HISTORY: Past Medical History  Diagnosis Date  . Hypertension   .  GERD (gastroesophageal reflux disease)   . Colon polyps   . Back pain, lumbosacral   . Sinusitis acute 03/24/2011  . Memory loss 04/16/2011  . Allergic state 05/30/2011  . Leukocytosis 12/15/2011  . Hyperlipidemia 02/15/2012  . Unspecified sinusitis (chronic) 04/05/2012  . Epicondylitis 10/10/2012  . Shortness of breath     WITH EXERTION AND BACK PAIN  . Wound drainage     L ELBOW  . Spinal stenosis, lumbar   . Nocturia   . Headache 05/05/2014  . Lung cancer     ALLERGIES:  is allergic to ibuprofen; gabapentin; and valacyclovir.  MEDICATIONS:  Current Outpatient Prescriptions  Medication Sig Dispense Refill  . acetaminophen (TYLENOL) 325 MG tablet Take 650 mg by mouth every morning.    Marland Kitchen amLODipine (NORVASC) 5 MG tablet   0  . aspirin 81 MG tablet Take 1 tablet (81 mg total) by mouth daily. Hold until 06/01/14; due to pleurex catheter placement; then resume therapy as previously indictaed 30 tablet   . atorvastatin (LIPITOR) 20 MG tablet Take 1 tablet (20 mg total) by mouth daily at 6 PM. 30 tablet 0  . clopidogrel (PLAVIX) 75 MG tablet Take 1 tablet (75 mg total) by mouth daily. 30 tablet 0  . dexamethasone (DECADRON) 4 MG tablet Take 1 tablet (4 mg total) by mouth 2 (two) times daily with a meal. The day before, the day of and the day after chemotherapy 30 tablet 0  . fluconazole (DIFLUCAN) 100 MG tablet Take 1 tablet (100 mg total) by mouth daily. 8 tablet 0  . folic acid (FOLVITE) 1 MG tablet Take 1 tablet (1 mg total) by mouth  daily. 30 tablet 3  . KIONEX 15 GM/60ML suspension   0  . naproxen sodium (ANAPROX) 220 MG tablet Take 220 mg by mouth 2 (two) times daily with a meal.    . nicotine (NICODERM CQ) 14 mg/24hr patch Place 1 patch (14 mg total) onto the skin daily. 28 patch 0  . nystatin (MYCOSTATIN) 100000 UNIT/ML suspension Take 5 mLs (500,000 Units total) by mouth 4 (four) times daily. 60 mL 0  . oxyCODONE (OXY IR/ROXICODONE) 5 MG immediate release tablet Take 1 tablet (5 mg  total) by mouth every 4 (four) hours as needed for severe pain. 30 tablet 0  . prochlorperazine (COMPAZINE) 10 MG tablet Take 1 tablet (10 mg total) by mouth every 6 (six) hours as needed for nausea or vomiting. 30 tablet 0  . QUEtiapine (SEROQUEL) 25 MG tablet Take 1 tablet (25 mg total) by mouth 3 (three) times daily as needed (Agitation and restlessness). 60 tablet 0  . ranitidine (ZANTAC) 300 MG tablet take 1 tablet by mouth at bedtime 30 tablet 5  . traMADol (ULTRAM) 50 MG tablet Take 1 tablet (50 mg total) by mouth every 8 (eight) hours as needed for severe pain. 30 tablet 0  . Alum & Mag Hydroxide-Simeth (MAGIC MOUTHWASH) SOLN Take 5 mLs by mouth 4 (four) times daily as needed for mouth pain. Swish and spit 160 mL 0   No current facility-administered medications for this visit.    SURGICAL HISTORY:  Past Surgical History  Procedure Laterality Date  . Lumbar laminectomy  1974    twice  . Urethral stricture dilatation    . Colonoscopy    . Penile prosthesis implant    . Cataracts      REMOVED  . Decompressive lumbar laminectomy level 2 N/A 11/26/2012    Procedure: CENTRAL DECOMPRESSION LUMBAR LAMINECTOMY L3-L4,  L4-L5   ;  Surgeon: Tobi Bastos, MD;  Location: [facility];  Service: Orthopedics;  Laterality: N/A;  . Chest tube insertion Left 05/31/2014    Procedure: INSERTION OF LEFT PLEURAL DRAINAGE CATHETER;  Surgeon: Grace Isaac, MD;  Location: [facility] [place];  Service: Thoracic;  Laterality: Left;  . Pleural effusion drainage Left 05/31/2014    Procedure: DRAINAGE OF LEFT PLEURAL EFFUSION;  Surgeon: Grace Isaac, MD;  Location: [facility];  Service: Thoracic;  Laterality: Left;  . Esophagogastroduodenoscopy (egd) with propofol N/A 06/29/2014    Procedure: ESOPHAGOGASTRODUODENOSCOPY (EGD) WITH PROPOFOL;  Surgeon: Milus Banister, MD;  Location: [facility];  Service: Endoscopy;  Laterality: N/A;    REVIEW OF SYSTEMS:  Constitutional: positive for anorexia, fatigue, malaise and weight  loss Eyes: negative Ears, nose, mouth, throat, and face: positive for sore mouth Respiratory: positive for dyspnea on exertion and inceased Cardiovascular: negative Gastrointestinal: negative Genitourinary:negative Integument/breast: negative Hematologic/lymphatic: negative Musculoskeletal:negative Neurological: negative Behavioral/Psych: negative Endocrine: negative Allergic/Immunologic: negative   PHYSICAL EXAMINATION: General appearance: alert, cooperative, fatigued and no distress Head: Normocephalic, without obvious abnormality, atraumatic Neck: no adenopathy, no JVD, supple, symmetrical, trachea midline and thyroid not enlarged, symmetric, no tenderness/mass/nodules Lymph nodes: Cervical, supraclavicular, and axillary nodes normal. Resp: clear to auscultation bilaterally Back: symmetric, no curvature. ROM normal. No CVA tenderness. Cardio: regular rate and rhythm, S1, S2 normal, no murmur, click, rub or gallop GI: soft, non-tender; bowel sounds normal; no masses,  no organomegaly Extremities: extremities normal, atraumatic, no cyanosis or edema Neurologic: Alert and oriented X 3, normal strength and tone. Normal symmetric reflexes. Normal coordination and gait Mouth reveals mucositis that was negative  for thrush  ECOG PERFORMANCE STATUS: 2 - Symptomatic, <50% confined to bed  Blood pressure 88/43, pulse 80, temperature 97.6 F (36.4 C), temperature source Oral, resp. rate 18, height '5\' 9"'$  (1.753 m), weight 151 lb 14.4 oz (68.901 kg), SpO2 100 %.  LABORATORY DATA: Lab Results  Component Value Date   WBC 8.1 07/24/2014   HGB 10.2* 07/24/2014   HCT 30.4* 07/24/2014   MCV 86.8 07/24/2014   PLT 468* 07/24/2014      Chemistry      Component Value Date/Time   NA 136 07/24/2014 1132   NA 136 06/15/2014 0822   K 4.1 07/24/2014 1132   K 3.5 06/15/2014 0822   CL 103 06/15/2014 0822   CO2 21* 07/24/2014 1132   CO2 21* 06/15/2014 0822   BUN 28.5* 07/24/2014 1132   BUN 19  06/15/2014 0822   CREATININE 1.0 07/24/2014 1132   CREATININE 0.80 06/15/2014 0822   CREATININE 0.77 05/04/2014 1458      Component Value Date/Time   CALCIUM 9.3 07/24/2014 1132   CALCIUM 8.9 06/15/2014 0822   ALKPHOS 95 07/24/2014 1132   ALKPHOS 83 06/15/2014 0822   AST 18 07/24/2014 1132   AST 17 06/15/2014 0822   ALT 26 07/24/2014 1132   ALT 17 06/15/2014 0822   BILITOT 0.65 07/24/2014 1132   BILITOT 0.6 06/15/2014 0822       RADIOGRAPHIC STUDIES: No results found.  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 The patient was discussed with and also seen by Dr. Julien Nordmann. He is now status post one cycle of systemic chemotherapy with Carboplatin AUC 5 and Alimta 500 mg/m2 given every 3 weeks. He tolerated the first cycle of chemotherapy relatively well the exception of mucositis and some increase in his baseline shortness of breath. He also has had some abdominal pain but his bowels are moving normally. Said some bloody secretions from his nose primarily in the mornings. Patient was last use of nasal saline to keep his membranes moist. For the mucositis a prescription for Magic mouthwash was given to the patient was also advised to use Biotene oral rinse. He'll continue with weekly labs and follow-up in 2 weeks prior to the start of cycle #2. His caregiver/family members are looking to have him move to a skilled nursing facility and [facility] which will be closer to them. I given them our social workers contact information so that she can hopefully facilitate this move. He reports having pain when his Pleurx catheter is drained. He was advised to request his pain medication about 30 minutes prior to the Pleurx drainage procedure.  For the recent stroke and  weakness, the patient will continue with physical therapy at the skilled nursing facility. The patient was advised to call immediately if he has any concerning symptoms in the interval. The patient voices understanding of current disease status and treatment options and is in agreement with the current care plan.  All questions were answered. The patient knows to call the clinic with any problems, questions or concerns. We can certainly see the patient much sooner if necessary.  Carlton Adam, PA-C 07/24/2014  ADDENDUM: Hematology/Oncology Attending: I had a face to face encounter with the patient. I recommended his care plan. This is a very pleasant 74 years old  white male with metastatic non-small cell lung cancer, adenocarcinoma who is currently undergoing systemic chemotherapy with carboplatin and Alimta status post 1 cycle. He tolerated the first week of his treatment fairly well except for increasing fatigue and weakness as well as mucositis and increase in his shortness of breath. We will start the patient on Magic mouthwash as well as by routine for the mucositis. We also advised the patient to continue drainage of the pleural effusion via the Pleurx catheter. We will see him back for follow-up visit in 2 weeks for reevaluation before starting cycle #2 of his chemotherapy. He was advised to call immediately if he has any other concerning symptoms in the interval.  Disclaimer: This note was dictated with voice recognition software. Similar sounding words can inadvertently be transcribed and may not be corrected upon review.  Eilleen Kempf., MD 07/26/2014

## 2014-07-24 NOTE — Telephone Encounter (Signed)
Spoke to nurse, Edwyna Ready, and he stated pt has done well as far as he knows.He did complain of nausea and received a compazine but no further recurrence of nausea. Zack reported he is eating and drinking fluids well.

## 2014-07-25 ENCOUNTER — Encounter: Payer: Self-pay | Admitting: Medical Oncology

## 2014-07-25 ENCOUNTER — Telehealth: Payer: Self-pay | Admitting: *Deleted

## 2014-07-25 NOTE — Telephone Encounter (Signed)
Prescription for Magic Mouthwash faxed to [facility].  Per Awilda Metro, PA.

## 2014-07-26 ENCOUNTER — Telehealth: Payer: Self-pay | Admitting: Internal Medicine

## 2014-07-26 NOTE — Telephone Encounter (Signed)
returned [place] records call and r/s appt to Thursdays...Marland Kitchenper MD ok.....facility aware of all new d.t

## 2014-07-26 NOTE — Patient Instructions (Signed)
Continue weekly labs Follow-up in 2 weeks prior to the start of your next scheduled cycle of chemotherapy

## 2014-08-01 ENCOUNTER — Telehealth: Payer: Self-pay | Admitting: *Deleted

## 2014-08-01 NOTE — Telephone Encounter (Signed)
TC from pt's niece, Maisie. She states that her uncle called from the skilled facility where he is staying s/p CVA and is telling Trula Ore that he is having increased pain in the area around his pleurx tube and generally increased pain all over. Maisie states he is getting only tylenol or Tramadol for pain without any relief. Pt asking to go to ED. Advised Maisie to speak with charge nurse @ facility re: pt's pain and if no action from the facility then consider taking him to local ED.Pt. Has oxycodone on his med list here @ [facility] and informed Maisie of that.  Adfvised her to call back if needed.  She agreed to above plan.

## 2014-08-02 ENCOUNTER — Other Ambulatory Visit: Payer: Self-pay

## 2014-08-02 ENCOUNTER — Encounter: Payer: Self-pay | Admitting: Skilled Nursing Facility1

## 2014-08-02 NOTE — Progress Notes (Signed)
Subjective:     Patient ID: Ian Gonzales, male   DOB: 1940-06-30, 74 y.o.   MRN: [id number]  HPI   Review of Systems     Objective:   Physical Exam To assist the pt in identifying dietary strategies to gain lost wt back.    Assessment:     Pt identified as being malnourished due to some lost wt. Pt contacted via the telephone [phone]. Pt was unavailable.    Plan:     No plan identified at this time as there was no voice messaging system.

## 2014-08-03 ENCOUNTER — Telehealth: Payer: Self-pay | Admitting: *Deleted

## 2014-08-03 ENCOUNTER — Other Ambulatory Visit: Payer: Self-pay

## 2014-08-03 ENCOUNTER — Inpatient Hospital Stay (HOSPITAL_COMMUNITY)
Admission: AD | Admit: 2014-08-03 | Discharge: 2014-08-11 | DRG: 871 | Disposition: A | Discharge status: Skilled Nursing Facility | Payer: Medicare Other | Source: Other Acute Inpatient Hospital | Attending: Internal Medicine | Admitting: Internal Medicine

## 2014-08-03 DIAGNOSIS — D490 Neoplasm of unspecified behavior of digestive system: Secondary | ICD-10-CM | POA: Diagnosis not present

## 2014-08-03 DIAGNOSIS — G609 Hereditary and idiopathic neuropathy, unspecified: Secondary | ICD-10-CM | POA: Diagnosis present

## 2014-08-03 DIAGNOSIS — Z888 Allergy status to other drugs, medicaments and biological substances status: Secondary | ICD-10-CM

## 2014-08-03 DIAGNOSIS — N400 Enlarged prostate without lower urinary tract symptoms: Secondary | ICD-10-CM | POA: Diagnosis present

## 2014-08-03 DIAGNOSIS — Z7982 Long term (current) use of aspirin: Secondary | ICD-10-CM

## 2014-08-03 DIAGNOSIS — F0390 Unspecified dementia without behavioral disturbance: Secondary | ICD-10-CM | POA: Diagnosis present

## 2014-08-03 DIAGNOSIS — D638 Anemia in other chronic diseases classified elsewhere: Secondary | ICD-10-CM | POA: Diagnosis present

## 2014-08-03 DIAGNOSIS — N492 Inflammatory disorders of scrotum: Secondary | ICD-10-CM | POA: Diagnosis present

## 2014-08-03 DIAGNOSIS — Z791 Long term (current) use of non-steroidal anti-inflammatories (NSAID): Secondary | ICD-10-CM | POA: Diagnosis not present

## 2014-08-03 DIAGNOSIS — G8929 Other chronic pain: Secondary | ICD-10-CM | POA: Diagnosis present

## 2014-08-03 DIAGNOSIS — E876 Hypokalemia: Secondary | ICD-10-CM | POA: Diagnosis present

## 2014-08-03 DIAGNOSIS — F528 Other sexual dysfunction not due to a substance or known physiological condition: Secondary | ICD-10-CM | POA: Diagnosis present

## 2014-08-03 DIAGNOSIS — A047 Enterocolitis due to Clostridium difficile: Secondary | ICD-10-CM | POA: Diagnosis present

## 2014-08-03 DIAGNOSIS — A4152 Sepsis due to Pseudomonas: Secondary | ICD-10-CM | POA: Diagnosis present

## 2014-08-03 DIAGNOSIS — F17210 Nicotine dependence, cigarettes, uncomplicated: Secondary | ICD-10-CM | POA: Diagnosis present

## 2014-08-03 DIAGNOSIS — G9340 Encephalopathy, unspecified: Secondary | ICD-10-CM | POA: Diagnosis present

## 2014-08-03 DIAGNOSIS — F17220 Nicotine dependence, chewing tobacco, uncomplicated: Secondary | ICD-10-CM | POA: Diagnosis present

## 2014-08-03 DIAGNOSIS — K219 Gastro-esophageal reflux disease without esophagitis: Secondary | ICD-10-CM | POA: Diagnosis present

## 2014-08-03 DIAGNOSIS — N453 Epididymo-orchitis: Secondary | ICD-10-CM | POA: Diagnosis present

## 2014-08-03 DIAGNOSIS — I639 Cerebral infarction, unspecified: Secondary | ICD-10-CM | POA: Diagnosis present

## 2014-08-03 DIAGNOSIS — Z7902 Long term (current) use of antithrombotics/antiplatelets: Secondary | ICD-10-CM | POA: Diagnosis not present

## 2014-08-03 DIAGNOSIS — F329 Major depressive disorder, single episode, unspecified: Secondary | ICD-10-CM | POA: Diagnosis present

## 2014-08-03 DIAGNOSIS — C3490 Malignant neoplasm of unspecified part of unspecified bronchus or lung: Secondary | ICD-10-CM | POA: Diagnosis present

## 2014-08-03 DIAGNOSIS — Z886 Allergy status to analgesic agent status: Secondary | ICD-10-CM | POA: Diagnosis not present

## 2014-08-03 DIAGNOSIS — E785 Hyperlipidemia, unspecified: Secondary | ICD-10-CM | POA: Diagnosis present

## 2014-08-03 DIAGNOSIS — N529 Male erectile dysfunction, unspecified: Secondary | ICD-10-CM | POA: Diagnosis present

## 2014-08-03 DIAGNOSIS — J910 Malignant pleural effusion: Secondary | ICD-10-CM | POA: Diagnosis present

## 2014-08-03 DIAGNOSIS — D6489 Other specified anemias: Secondary | ICD-10-CM | POA: Diagnosis not present

## 2014-08-03 DIAGNOSIS — Z79899 Other long term (current) drug therapy: Secondary | ICD-10-CM | POA: Diagnosis not present

## 2014-08-03 DIAGNOSIS — I10 Essential (primary) hypertension: Secondary | ICD-10-CM | POA: Diagnosis present

## 2014-08-03 DIAGNOSIS — E43 Unspecified severe protein-calorie malnutrition: Secondary | ICD-10-CM | POA: Diagnosis present

## 2014-08-03 DIAGNOSIS — L8990 Pressure ulcer of unspecified site, unspecified stage: Secondary | ICD-10-CM | POA: Insufficient documentation

## 2014-08-03 DIAGNOSIS — Z960 Presence of urogenital implants: Secondary | ICD-10-CM | POA: Diagnosis present

## 2014-08-03 DIAGNOSIS — M4806 Spinal stenosis, lumbar region: Secondary | ICD-10-CM | POA: Diagnosis present

## 2014-08-03 DIAGNOSIS — Z720 Tobacco use: Secondary | ICD-10-CM | POA: Diagnosis present

## 2014-08-03 DIAGNOSIS — J449 Chronic obstructive pulmonary disease, unspecified: Secondary | ICD-10-CM | POA: Diagnosis present

## 2014-08-03 DIAGNOSIS — Z8673 Personal history of transient ischemic attack (TIA), and cerebral infarction without residual deficits: Secondary | ICD-10-CM | POA: Diagnosis not present

## 2014-08-03 DIAGNOSIS — D649 Anemia, unspecified: Secondary | ICD-10-CM | POA: Diagnosis not present

## 2014-08-03 DIAGNOSIS — Z9221 Personal history of antineoplastic chemotherapy: Secondary | ICD-10-CM

## 2014-08-03 DIAGNOSIS — I6529 Occlusion and stenosis of unspecified carotid artery: Secondary | ICD-10-CM | POA: Diagnosis present

## 2014-08-03 DIAGNOSIS — Z79891 Long term (current) use of opiate analgesic: Secondary | ICD-10-CM | POA: Diagnosis not present

## 2014-08-03 DIAGNOSIS — R195 Other fecal abnormalities: Secondary | ICD-10-CM | POA: Diagnosis present

## 2014-08-03 DIAGNOSIS — Z515 Encounter for palliative care: Secondary | ICD-10-CM | POA: Diagnosis not present

## 2014-08-03 DIAGNOSIS — M48062 Spinal stenosis, lumbar region with neurogenic claudication: Secondary | ICD-10-CM | POA: Diagnosis present

## 2014-08-03 DIAGNOSIS — N433 Hydrocele, unspecified: Secondary | ICD-10-CM | POA: Diagnosis present

## 2014-08-03 DIAGNOSIS — C3400 Malignant neoplasm of unspecified main bronchus: Secondary | ICD-10-CM | POA: Diagnosis not present

## 2014-08-03 DIAGNOSIS — M545 Low back pain: Secondary | ICD-10-CM | POA: Diagnosis present

## 2014-08-03 DIAGNOSIS — D509 Iron deficiency anemia, unspecified: Secondary | ICD-10-CM | POA: Diagnosis not present

## 2014-08-03 DIAGNOSIS — A0472 Enterocolitis due to Clostridium difficile, not specified as recurrent: Secondary | ICD-10-CM

## 2014-08-03 DIAGNOSIS — R0600 Dyspnea, unspecified: Secondary | ICD-10-CM

## 2014-08-03 DIAGNOSIS — D508 Other iron deficiency anemias: Secondary | ICD-10-CM | POA: Diagnosis not present

## 2014-08-03 NOTE — Telephone Encounter (Signed)
Melissa RN with [facility] called reporting "patient is too weak to travel for today's appointment.  We're giving him IVF.  What labs were to be drawn today.  We can draw them.  Give me the fax number and we'll fax results."  Weekly cbc-diff and CMET order observed.  Fax number and labs given to [facility].

## 2014-08-04 ENCOUNTER — Inpatient Hospital Stay (HOSPITAL_COMMUNITY): Payer: Medicare Other

## 2014-08-04 DIAGNOSIS — K921 Melena: Secondary | ICD-10-CM

## 2014-08-04 DIAGNOSIS — E43 Unspecified severe protein-calorie malnutrition: Secondary | ICD-10-CM | POA: Diagnosis present

## 2014-08-04 DIAGNOSIS — Z720 Tobacco use: Secondary | ICD-10-CM

## 2014-08-04 DIAGNOSIS — N453 Epididymo-orchitis: Secondary | ICD-10-CM | POA: Diagnosis present

## 2014-08-04 DIAGNOSIS — C3490 Malignant neoplasm of unspecified part of unspecified bronchus or lung: Secondary | ICD-10-CM

## 2014-08-04 DIAGNOSIS — L8990 Pressure ulcer of unspecified site, unspecified stage: Secondary | ICD-10-CM | POA: Insufficient documentation

## 2014-08-04 DIAGNOSIS — N492 Inflammatory disorders of scrotum: Secondary | ICD-10-CM

## 2014-08-04 DIAGNOSIS — J189 Pneumonia, unspecified organism: Secondary | ICD-10-CM | POA: Insufficient documentation

## 2014-08-04 DIAGNOSIS — R195 Other fecal abnormalities: Secondary | ICD-10-CM | POA: Diagnosis present

## 2014-08-04 LAB — BASIC METABOLIC PANEL
ANION GAP: 11 (ref 5–15)
BUN: 30 mg/dL — ABNORMAL HIGH (ref 6–20)
CO2: 22 mmol/L (ref 22–32)
Calcium: 8.2 mg/dL — ABNORMAL LOW (ref 8.9–10.3)
Chloride: 103 mmol/L (ref 101–111)
Creatinine, Ser: 0.88 mg/dL (ref 0.61–1.24)
Glucose, Bld: 122 mg/dL — ABNORMAL HIGH (ref 65–99)
Potassium: 3.6 mmol/L (ref 3.5–5.1)
Sodium: 136 mmol/L (ref 135–145)

## 2014-08-04 LAB — CBC
HCT: 26.9 % — ABNORMAL LOW (ref 39.0–52.0)
HEMOGLOBIN: 9 g/dL — AB (ref 13.0–17.0)
MCH: 28.7 pg (ref 26.0–34.0)
MCHC: 33.5 g/dL (ref 30.0–36.0)
MCV: 85.7 fL (ref 78.0–100.0)
Platelets: 319 10*3/uL (ref 150–400)
RBC: 3.14 MIL/uL — ABNORMAL LOW (ref 4.22–5.81)
RDW: 14.3 % (ref 11.5–15.5)
WBC: 27.9 10*3/uL — ABNORMAL HIGH (ref 4.0–10.5)

## 2014-08-04 LAB — ABO/RH: ABO/RH(D): O POS

## 2014-08-04 LAB — PROCALCITONIN: PROCALCITONIN: 2.57 ng/mL

## 2014-08-04 LAB — PROTIME-INR
INR: 1.49 (ref 0.00–1.49)
Prothrombin Time: 18.1 seconds — ABNORMAL HIGH (ref 11.6–15.2)

## 2014-08-04 LAB — LACTIC ACID, PLASMA: Lactic Acid, Venous: 1.1 mmol/L (ref 0.5–2.0)

## 2014-08-04 LAB — STREP PNEUMONIAE URINARY ANTIGEN: STREP PNEUMO URINARY ANTIGEN: NEGATIVE

## 2014-08-04 LAB — APTT: aPTT: 44 seconds — ABNORMAL HIGH (ref 24–37)

## 2014-08-04 LAB — MRSA PCR SCREENING: MRSA by PCR: NEGATIVE

## 2014-08-04 MED ORDER — MAGIC MOUTHWASH
5.0000 mL | Freq: Four times a day (QID) | ORAL | Status: DC | PRN
  Filled 2014-08-04: qty 5

## 2014-08-04 MED ORDER — SODIUM CHLORIDE 0.9 % IV SOLN
INTRAVENOUS | Status: DC
  Administered 2014-08-04 – 2014-08-05 (×2): via INTRAVENOUS

## 2014-08-04 MED ORDER — IPRATROPIUM-ALBUTEROL 0.5-2.5 (3) MG/3ML IN SOLN: 3.0000 mL | RESPIRATORY_TRACT | Status: DC | PRN

## 2014-08-04 MED ORDER — PANTOPRAZOLE SODIUM 40 MG IV SOLR
40.0000 mg | Freq: Two times a day (BID) | INTRAVENOUS | Status: DC
  Administered 2014-08-04: 40 mg via INTRAVENOUS
  Filled 2014-08-04 (×3): qty 40

## 2014-08-04 MED ORDER — MORPHINE SULFATE 2 MG/ML IJ SOLN
1.0000 mg | Freq: Once | INTRAMUSCULAR | Status: AC
  Administered 2014-08-04: 1 mg via INTRAVENOUS
  Filled 2014-08-04: qty 1

## 2014-08-04 MED ORDER — BACLOFEN 5 MG HALF TABLET
5.0000 mg | ORAL_TABLET | Freq: Four times a day (QID) | ORAL | Status: DC
  Filled 2014-08-04 (×4): qty 1

## 2014-08-04 MED ORDER — NICOTINE 14 MG/24HR TD PT24
14.0000 mg | MEDICATED_PATCH | Freq: Every day | TRANSDERMAL | Status: DC
  Administered 2014-08-04 – 2014-08-11 (×8): 14 mg via TRANSDERMAL
  Filled 2014-08-04 (×8): qty 1

## 2014-08-04 MED ORDER — BACLOFEN 5 MG HALF TABLET
5.0000 mg | ORAL_TABLET | Freq: Four times a day (QID) | ORAL | Status: DC | PRN
Start: 2014-08-04 — End: 2014-08-11
  Filled 2014-08-04: qty 1

## 2014-08-04 MED ORDER — PIPERACILLIN-TAZOBACTAM 3.375 G IVPB
3.3750 g | Freq: Three times a day (TID) | INTRAVENOUS | Status: DC
  Administered 2014-08-04 – 2014-08-08 (×13): 3.375 g via INTRAVENOUS
  Filled 2014-08-04 (×15): qty 50

## 2014-08-04 MED ORDER — OXYCODONE HCL 5 MG PO TABS
5.0000 mg | ORAL_TABLET | ORAL | Status: DC | PRN
  Administered 2014-08-04 – 2014-08-10 (×16): 5 mg via ORAL
  Filled 2014-08-04 (×16): qty 1

## 2014-08-04 MED ORDER — ACETAMINOPHEN 325 MG PO TABS
650.0000 mg | ORAL_TABLET | Freq: Every morning | ORAL | Status: DC
  Administered 2014-08-04 – 2014-08-11 (×8): 650 mg via ORAL
  Filled 2014-08-04 (×8): qty 2

## 2014-08-04 MED ORDER — FOLIC ACID 1 MG PO TABS
1.0000 mg | ORAL_TABLET | Freq: Every day | ORAL | Status: DC
  Administered 2014-08-04 – 2014-08-11 (×8): 1 mg via ORAL
  Filled 2014-08-04 (×8): qty 1

## 2014-08-04 MED ORDER — HYDRALAZINE HCL 20 MG/ML IJ SOLN: 5.0000 mg | INTRAMUSCULAR | Status: DC | PRN

## 2014-08-04 MED ORDER — ENSURE ENLIVE PO LIQD
237.0000 mL | Freq: Three times a day (TID) | ORAL | Status: DC
  Administered 2014-08-05 – 2014-08-07 (×8): 237 mL via ORAL

## 2014-08-04 MED ORDER — PANTOPRAZOLE SODIUM 40 MG PO TBEC
40.0000 mg | DELAYED_RELEASE_TABLET | Freq: Two times a day (BID) | ORAL | Status: DC
  Administered 2014-08-04 – 2014-08-07 (×7): 40 mg via ORAL
  Filled 2014-08-04 (×7): qty 1

## 2014-08-04 MED ORDER — VANCOMYCIN HCL IN DEXTROSE 1-5 GM/200ML-% IV SOLN
1000.0000 mg | Freq: Once | INTRAVENOUS | Status: AC
  Administered 2014-08-04: 1000 mg via INTRAVENOUS
  Filled 2014-08-04: qty 200

## 2014-08-04 MED ORDER — AMITRIPTYLINE HCL 10 MG PO TABS
10.0000 mg | ORAL_TABLET | Freq: Every day | ORAL | Status: DC
  Administered 2014-08-04 – 2014-08-10 (×7): 10 mg via ORAL
  Filled 2014-08-04 (×8): qty 1

## 2014-08-04 MED ORDER — VANCOMYCIN HCL IN DEXTROSE 750-5 MG/150ML-% IV SOLN
750.0000 mg | Freq: Two times a day (BID) | INTRAVENOUS | Status: DC
  Administered 2014-08-04 – 2014-08-06 (×4): 750 mg via INTRAVENOUS
  Filled 2014-08-04 (×5): qty 150

## 2014-08-04 MED ORDER — ATORVASTATIN CALCIUM 20 MG PO TABS
20.0000 mg | ORAL_TABLET | Freq: Every day | ORAL | Status: DC
  Administered 2014-08-04 – 2014-08-11 (×8): 20 mg via ORAL
  Filled 2014-08-04 (×8): qty 1

## 2014-08-04 MED ORDER — DM-GUAIFENESIN ER 30-600 MG PO TB12
1.0000 | ORAL_TABLET | Freq: Two times a day (BID) | ORAL | Status: DC
  Administered 2014-08-04 (×2): 1 via ORAL
  Filled 2014-08-04 (×5): qty 1

## 2014-08-04 MED ORDER — HYDROXYZINE HCL 50 MG/ML IM SOLN
25.0000 mg | Freq: Four times a day (QID) | INTRAMUSCULAR | Status: DC | PRN
  Administered 2014-08-04: 25 mg via INTRAMUSCULAR
  Filled 2014-08-04 (×2): qty 0.5

## 2014-08-04 NOTE — H&P (Signed)
[facility] History and Physical  Ian Gonzales MVH:[id number] DOB: 1940/02/18 DOA: 08/03/2014  Referring physician: ED physician PCP: Penni Homans, MD  Specialists:   Chief Complaint: Altered mental status, shortness of breath, bloody stool, scrotal swelling and pain  HPI: Ian Gonzales is a 74 y.o. male with PMH of hypertension, hyperlipidemia, chronic back pain due to lumbar spinal stenosis, GERD, depression, metastatic adenocarcinoma of unknown primary questionable for lung cancer versus gastrointestinal diagnosed in May 2016, recently diagnosed stage IV lung cancer, stroke, COPD, who presents with altered mental status, shortness of breath, bloody stool, scrotal swelling and pain  Patient has AMS, and is unable to provide much history. Therefore, most of the history is obtained from [place] and per EMS report. Patient initially presented to [facility] from [place] because of altered mental status. Per family members, patient seems to be less alert over the past 2 days. Patient's family states that patient has scrotum swelling and pain in the past several day. He has a penile implant that they think is causing the infection. Of note, patient has a stage IV lung cancer and has been receiving IV fluids in the rehabilitation over the past 2 days for rehydration. Per family, patient received a call from [facility] today with abnormal labs, including elevated WBC and low hemoglobin. Patient seems to have shortness of breath and dry cough. No chest pain per family. He also has several bloody stool per family. He seems to be weaker than his normal baseline.  In ED, patient was found to have positive FOBT, hemoglobin dropped from 10.2 on 07/24/14 to 7.0. WBC 29.3, electrolytes okay, temperature normal, no tachycardia. Chest x-ray showed left pleural effusion with left basal atelectasis versus infiltration. CT abdomen/pelvis with contrast showed stable  loculated left pleural effusion with associated drainage catheter, mild hydrocele is noted in the left side of scrotum. 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  Where does patient live?   Rehab center  Can patient participate in ADLs?  None  Review of Systems: Could not obtained due to AMS   Allergy:  Allergies  Allergen Reactions  . Ibuprofen Other (See Comments)    "It made me feel worse"  . Gabapentin     disequilibrium  . Valacyclovir Other (See Comments)    constipation    Past Medical History  Diagnosis Date  . Hypertension   . GERD (gastroesophageal reflux disease)   . Colon polyps   . Back pain, lumbosacral   . Sinusitis acute 03/24/2011  . Memory loss 04/16/2011  . Allergic state 05/30/2011  . Leukocytosis 12/15/2011  . Hyperlipidemia 02/15/2012  . Unspecified sinusitis (chronic) 04/05/2012  . Epicondylitis 10/10/2012  . Shortness of breath     WITH EXERTION AND BACK PAIN  . Wound drainage     L ELBOW  . Spinal stenosis, lumbar   . Nocturia   . Headache 05/05/2014  . Lung cancer     Past Surgical History  Procedure Laterality Date  . Lumbar laminectomy  1974    twice  . Urethral stricture dilatation    . Colonoscopy    . Penile prosthesis implant    . Cataracts      REMOVED  . Decompressive lumbar laminectomy level 2 N/A 11/26/2012    Procedure: CENTRAL DECOMPRESSION LUMBAR LAMINECTOMY L3-L4,  L4-L5   ;  Surgeon: Tobi Bastos, MD;  Location: [facility];  Service: Orthopedics;  Laterality: N/A;  . Chest tube  insertion Left 05/31/2014    Procedure: INSERTION OF LEFT PLEURAL DRAINAGE CATHETER;  Surgeon: Grace Isaac, MD;  Location: [facility];  Service: Thoracic;  Laterality: Left;  . Pleural effusion drainage Left 05/31/2014    Procedure: DRAINAGE OF LEFT PLEURAL EFFUSION;  Surgeon: Grace Isaac, MD;  Location: [facility];  Service: Thoracic;  Laterality: Left;  . Esophagogastroduodenoscopy (egd) with propofol N/A 06/29/2014    Procedure:  ESOPHAGOGASTRODUODENOSCOPY (EGD) WITH PROPOFOL;  Surgeon: Milus Banister, MD;  Location: [facility];  Service: Endoscopy;  Laterality: N/A;    Social History:  reports that he has been smoking Cigarettes.  He has a 15.75 pack-year smoking history. He has quit using smokeless tobacco. His smokeless tobacco use included Chew. He reports that he drinks alcohol. He reports that he does not use illicit drugs.  Family History: Could not reviewed due to AMS  Prior to Admission medications   Medication Sig Start Date End Date Taking? Authorizing Provider  acetaminophen (TYLENOL) 325 MG tablet Take 650 mg by mouth every morning.    Historical Provider, MD  Alum & Mag Hydroxide-Simeth (MAGIC MOUTHWASH) SOLN Take 5 mLs by mouth 4 (four) times daily as needed for mouth pain. Swish and spit 07/24/14   Carlton Adam, PA-C  amLODipine (NORVASC) 5 MG tablet  06/01/14   Historical Provider, MD  aspirin 81 MG tablet Take 1 tablet (81 mg total) by mouth daily. Hold until 06/01/14; due to pleurex catheter placement; then resume therapy as previously indictaed 05/31/14   Barton Dubois, MD  atorvastatin (LIPITOR) 20 MG tablet Take 1 tablet (20 mg total) by mouth daily at 6 PM. 06/17/14   Allie Bossier, MD  clopidogrel (PLAVIX) 75 MG tablet Take 1 tablet (75 mg total) by mouth daily. 06/17/14   Allie Bossier, MD  dexamethasone (DECADRON) 4 MG tablet Take 1 tablet (4 mg total) by mouth 2 (two) times daily with a meal. The day before, the day of and the day after chemotherapy 07/06/14   Carlton Adam, PA-C  fluconazole (DIFLUCAN) 100 MG tablet Take 1 tablet (100 mg total) by mouth daily. 06/17/14   Allie Bossier, MD  folic acid (FOLVITE) 1 MG tablet Take 1 tablet (1 mg total) by mouth daily. 07/06/14   Carlton Adam, PA-C  KIONEX 15 GM/60ML suspension  06/08/14   Historical Provider, MD  naproxen sodium (ANAPROX) 220 MG tablet Take 220 mg by mouth 2 (two) times daily with a meal.    Historical Provider, MD  nicotine  (NICODERM CQ) 14 mg/24hr patch Place 1 patch (14 mg total) onto the skin daily. 05/31/14   Barton Dubois, MD  nystatin (MYCOSTATIN) 100000 UNIT/ML suspension Take 5 mLs (500,000 Units total) by mouth 4 (four) times daily. 06/17/14   Allie Bossier, MD  oxyCODONE (OXY IR/ROXICODONE) 5 MG immediate release tablet Take 1 tablet (5 mg total) by mouth every 4 (four) hours as needed for severe pain. 06/17/14   Allie Bossier, MD  prochlorperazine (COMPAZINE) 10 MG tablet Take 1 tablet (10 mg total) by mouth every 6 (six) hours as needed for nausea or vomiting. 07/06/14   Carlton Adam, PA-C  QUEtiapine (SEROQUEL) 25 MG tablet Take 1 tablet (25 mg total) by mouth 3 (three) times daily as needed (Agitation and restlessness). 06/17/14   Allie Bossier, MD  ranitidine (ZANTAC) 300 MG tablet take 1 tablet by mouth at bedtime 03/15/14   Mosie Lukes, MD  traMADol Veatrice Bourbon) 50  MG tablet Take 1 tablet (50 mg total) by mouth every 8 (eight) hours as needed for severe pain. 05/31/14   Barton Dubois, MD    Physical Exam: Filed Vitals:   08/03/14 2300  BP: 115/62  Pulse: 83  Temp: 97.4 F (36.3 C)  TempSrc: Oral  Resp: 19  Height: '5\' 9"'$  (1.753 m)  Weight: 66.3 kg (146 lb 2.6 oz)  SpO2: 100%   General: Not in acute distress. HEENT:       Eyes: PERRL, EOMI, no scleral icterus.       ENT: No discharge from the ears and nose, no pharynx injection, no tonsillar enlargement.        Neck: No JVD, no bruit, no mass felt. Heme: No neck lymph node enlargement. Cardiac: S1/S2, RRR, No murmurs, No gallops or rubs. Pulm:  No rales, wheezing, rhonchi or rubs. Abd: Soft, nondistended, nontender, no rebound pain, no organomegaly, BS present. GU: has tenderness, swelling and redness over his scrotum (L worse than R) Ext: No pitting leg edema bilaterally. 2+DP/PT pulse bilaterally. Musculoskeletal: No joint deformities, No joint redness or warmth, no limitation of ROM in spin. Skin: No rashes.  Neuro: drowsy, not oriented  X3, cranial nerves II-XII grossly intact, moves all extremities. Psych: could not be assessed due to [place] on Admission:  Basic Metabolic Panel: No results for input(s): NA, K, CL, CO2, GLUCOSE, BUN, CREATININE, CALCIUM, MG, PHOS in the last 168 hours. Liver Function Tests: No results for input(s): AST, ALT, ALKPHOS, BILITOT, PROT, ALBUMIN in the last 168 hours. No results for input(s): LIPASE, AMYLASE in the last 168 hours. No results for input(s): AMMONIA in the last 168 hours. CBC: No results for input(s): WBC, NEUTROABS, HGB, HCT, MCV, PLT in the last 168 hours. Cardiac Enzymes: No results for input(s): CKTOTAL, CKMB, CKMBINDEX, TROPONINI in the last 168 hours.  BNP (last 3 results)  Recent Labs  05/30/14 1630  BNP 25.2    ProBNP (last 3 results) No results for input(s): PROBNP in the last 8760 hours.  CBG: No results for input(s): GLUCAP in the last 168 hours.  Radiological Exams on Admission: No results found.  EKG: Independently reviewed.  Abnormal findings:   QTC 470, incomplete right bundle blockage  Assessment/Plan Principal Problem:   Cellulitis, scrotum Active Problems:   ERECTILE DYSFUNCTION   Essential hypertension   COPD, severity to be determined   GERD   BPH (benign prostatic hyperplasia)   Spinal stenosis, lumbar region, with neurogenic claudication   Hereditary and idiopathic peripheral neuropathy   CVA (cerebral vascular accident)   Carotid stenosis   Acute encephalopathy   Lung cancer   Neoplasm of hypopharynx   Parotid tumor   Tobacco abuse   GIB (gastrointestinal bleeding)  Scrotal cellulitis:  Patient has swelling, tenderness and warmth in his scrotum, consistent with cellulitis. Patient has elevated WBC 29.3, but not obviously septic (no fever, tachycardia). Since patient is receiving chemotherapy, he is immunosuppressed, and needs antibodies with broad coverage. -will admit to SDU due to GIB and AMS -He received Rocephin and  azithromycin in the emergency room of [facility], will switch to vancomycin and Zosyn -will get Procalcitonin and trend lactic acid levels  -IVF: 1L of NS bolus in ED, followed by 125 cc/h (I asked ED, Dr. Colin Rhein to give 2 more L of NS before transfer, but I could not find the documented NS amount) -prn oxycodone for pain -follow up blood culture -follow up scrotum-US  GIB: FOBT positive.  Hemoglobin dropped from 10.2 to 7.0. Hemodynamically stable. Patient had colonoscopy on 09/23/12, which showed 3 polyps, and had polypectomy. He also had EGD on 06/29/14, which showed very mild gastritis by dr. Earlie Raveling - will hold ASA, plavix, naproxen - follow up GI Recommendation - Start IV pantoprazole 40 mg bib - Hydroxyzine prn for nausea - Avoid NSAIDs and SQ heparin - Monitor closely and follow q6h cbc, transfuse as necessary. - [facility]: INR, PTT and type screen  Adenocarcinoma of unknown primary:  Patient has been followed up by Dr. Julien Nordmann. Per his note on 07/24/14, this is questionable for lung cancer versus gastrointestinal origin.  He received systemic chemotherapy, now status post 1 cycle. Plan was to follow in 2 weeks before next chemotherapy. -follow up with dr. Julien Nordmann. -has Pleurx catheter for plural effusion, may get culture when drainning  Essential hypertension: Blood pressure is not elevated, patient is at risk of developing sepsis. -Hold amlodipine -IV hydralazine when necessary  COPD: Patient has a dry cough and mild shortness of breath. Chest x-ray showed questionable atelectasis versus infiltration at left lower base. Clinically patient does not seem to have COPD exacerbation or pneumonia. No wheezing or rhonchi on lung auscultation. - Mucinex for cough, DuoNeb when necessary for shortness of breath - Urine legionella and S. pneumococcal antigen - Follow up blood culture x2, sputum culture  Spinal stenosis, lumbar region, with neurogenic claudication: -When necessary oxycodone,  baclofen  CVA (cerebral vascular accident): No acute issues -Hold aspirin and Plavix due to GI bleeding -Continue Lipitor  Tobacco abuse -Nicotine patch  Acute encephalopathy: Likely multifactorial, including scrotum cellulitis, worsening anemia and multiple comorbidities -Neuro check  -Treat underlying problems as above  HLD: Last LDL was 101 on 06/13/14 -Continue home medications:   Depression: -continue amitriptyline   DVT ppx: SCD  Code Status: Full code Family Communication: None at bed side.   Disposition Plan: Admit to inpatient   Date of Service 08/04/2014    Ivor Costa [facility] Pager [phone]  If 7PM-7AM, please contact night-coverage [email or web address] Password [email or web address] 08/04/2014, 3:05 AM

## 2014-08-04 NOTE — Consult Note (Signed)
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 male.   HPI:   1 - Left Epidiymorchitis - pt admitted from facility with few days metnal status chagnes and some left scrotal discomfort found to have likely left epidiymorchitis by CT and [place]. Leukocytosis to 27K. UCX, BCX obtainend / pending. Now on empiric Vanc + Zosyn. He is on chemo for metastatic adenocarcinoma.  Imaging this admission with impressive left testicular and epidymal hyperemia and complex / reactive hydrocele. This fortunately appears away from IPP resevoir (rt pre pubic space), pump (rt hemicrotum), and cylinders.  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   PMH sig for metastatic adenocarcinoma (recieving chemo recently),COPD, Plurex catheter for chronic effusion, GI Bleed,  severe dementia (lives in facility),. His POA is Ian Gonzales (friend) at [phone]  Today Ian Gonzales is seen in consultation for above. He is referred by Dr. Conley Gonzales.   Past Medical History  Diagnosis Date  . Hypertension   . GERD (gastroesophageal reflux disease)   . Colon polyps   . Back pain, lumbosacral   . Sinusitis acute 03/24/2011  . Memory loss 04/16/2011  . Allergic state 05/30/2011  . Leukocytosis 12/15/2011  . Hyperlipidemia 02/15/2012  . Unspecified sinusitis (chronic) 04/05/2012  . Epicondylitis 10/10/2012  . Shortness of breath     WITH EXERTION AND BACK PAIN  . Wound drainage     L ELBOW  . Spinal stenosis, lumbar   . Nocturia   . Headache 05/05/2014  . Lung cancer     Past Surgical History  Procedure Laterality Date  . Lumbar laminectomy  1974    twice  . Urethral stricture dilatation    . Colonoscopy    . Penile prosthesis implant    . Cataracts      REMOVED  . Decompressive lumbar  laminectomy level 2 N/A 11/26/2012    Procedure: CENTRAL DECOMPRESSION LUMBAR LAMINECTOMY L3-L4,  L4-L5   ;  Surgeon: Ian Bastos, MD;  Location: [facility];  Service: Orthopedics;  Laterality: N/A;  . Chest tube insertion Left 05/31/2014    Procedure: INSERTION OF LEFT PLEURAL DRAINAGE CATHETER;  Surgeon: Ian Isaac, MD;  Location: [place];  Service: Thoracic;  Laterality: Left;  . Pleural effusion drainage Left 05/31/2014    Procedure: DRAINAGE OF LEFT PLEURAL EFFUSION;  Surgeon: Ian Isaac, MD;  Location: [place];  Service: Thoracic;  Laterality: Left;  . Esophagogastroduodenoscopy (egd) with propofol N/A 06/29/2014    Procedure: ESOPHAGOGASTRODUODENOSCOPY (EGD) WITH PROPOFOL;  Surgeon: Ian Banister, MD;  Location: [facility];  Service: Endoscopy;  Laterality: N/A;    No family history on file.  Social History:  reports that he has been smoking Cigarettes.  He has a 15.75 pack-year smoking history. He has quit using smokeless tobacco. His smokeless tobacco use included Chew. He reports that he drinks alcohol. He reports that he does not use illicit drugs.  Allergies:  Allergies  Allergen Reactions  . Gabapentin Other (See Comments)    Dysfunction of equilibrium   . Ibuprofen Other (See Comments)    "It made me feel worse"  . Valacyclovir Other (See Comments)    constipation    Medications: I have reviewed the patient's current medications.  Results for orders placed or performed during  the hospital encounter of 08/03/14 (from the past 48 hour(s))  MRSA PCR Screening     Status: None   Collection Time: 08/03/14 11:09 PM  Result Value Ref Range   MRSA by PCR NEGATIVE NEGATIVE    Comment:        The GeneXpert MRSA Assay (FDA approved for NASAL specimens only), is one component of a comprehensive MRSA colonization surveillance program. It is not intended to diagnose MRSA infection nor to guide or monitor treatment for MRSA infections.   Strep pneumoniae urinary  antigen  (not at [facility])     Status: None   Collection Time: 08/04/14  5:04 AM  Result Value Ref Range   Strep Pneumo Urinary Antigen NEGATIVE NEGATIVE    Comment:        Infection due to S. pneumoniae cannot be absolutely ruled out since the antigen present may be below the detection limit of the test.   Type and screen     Status: None   Collection Time: 08/04/14  6:45 AM  Result Value Ref Range   ABO/RH(D) O POS    Antibody Screen NEG    Sample Expiration 08/07/2014   Lactic acid, plasma     Status: None   Collection Time: 08/04/14  6:45 AM  Result Value Ref Range   Lactic Acid, Venous 1.1 0.5 - 2.0 mmol/L  ABO/Rh     Status: None   Collection Time: 08/04/14  6:45 AM  Result Value Ref Range   ABO/RH(D) O POS   Protime-INR     Status: Abnormal   Collection Time: 08/04/14  7:01 AM  Result Value Ref Range   Prothrombin Time 18.1 (H) 11.6 - 15.2 seconds   INR 1.49 0.00 - 1.49  APTT     Status: Abnormal   Collection Time: 08/04/14  7:01 AM  Result Value Ref Range   aPTT 44 (H) 24 - 37 seconds    Comment:        IF BASELINE aPTT IS ELEVATED, SUGGEST PATIENT RISK ASSESSMENT BE USED TO DETERMINE APPROPRIATE ANTICOAGULANT THERAPY.   CBC     Status: Abnormal   Collection Time: 08/04/14  7:01 AM  Result Value Ref Range   WBC 27.9 (H) 4.0 - 10.5 K/uL   RBC 3.14 (L) 4.22 - 5.81 MIL/uL   Hemoglobin 9.0 (L) 13.0 - 17.0 g/dL   HCT 26.9 (L) 39.0 - 52.0 %   MCV 85.7 78.0 - 100.0 fL   MCH 28.7 26.0 - 34.0 pg   MCHC 33.5 30.0 - 36.0 g/dL   RDW 14.3 11.5 - 15.5 %   Platelets 319 150 - 400 K/uL  Procalcitonin     Status: None   Collection Time: 08/04/14  7:01 AM  Result Value Ref Range   Procalcitonin 2.57 ng/mL    Comment:        Interpretation: PCT > 2 ng/mL: Systemic infection (sepsis) is likely, unless other causes are known. (NOTE)         ICU PCT Algorithm               Non ICU PCT Algorithm    ----------------------------     ------------------------------          PCT < 0.25 ng/mL                 PCT < 0.1 ng/mL     Stopping of antibiotics            Stopping of antibiotics  strongly encouraged.               strongly encouraged.    ----------------------------     ------------------------------       PCT level decrease by               PCT < 0.25 ng/mL       >= 80% from peak PCT       OR PCT 0.25 - 0.5 ng/mL          Stopping of antibiotics                                             encouraged.     Stopping of antibiotics           encouraged.    ----------------------------     ------------------------------       PCT level decrease by              PCT >= 0.25 ng/mL       < 80% from peak PCT        AND PCT >= 0.5 ng/mL            Continuing antibiotics                                               encouraged.       Continuing antibiotics            encouraged.    ----------------------------     ------------------------------     PCT level increase compared          PCT > 0.5 ng/mL         with peak PCT AND          PCT >= 0.5 ng/mL             Escalation of antibiotics                                          strongly encouraged.      Escalation of antibiotics        strongly encouraged.   Basic metabolic panel     Status: Abnormal   Collection Time: 08/04/14  7:01 AM  Result Value Ref Range   Sodium 136 135 - 145 mmol/L   Potassium 3.6 3.5 - 5.1 mmol/L   Chloride 103 101 - 111 mmol/L   CO2 22 22 - 32 mmol/L   Glucose, Bld 122 (H) 65 - 99 mg/dL   BUN 30 (H) 6 - 20 mg/dL   Creatinine, Ser 0.88 0.61 - 1.24 mg/dL   Calcium 8.2 (L) 8.9 - 10.3 mg/dL   GFR calc non Af Amer >60 >60 mL/min   GFR calc Af Amer >60 >60 mL/min    Comment: (NOTE) The eGFR has been calculated using the CKD EPI equation. This calculation has not been validated in all clinical situations. eGFR's persistently <60 mL/min signify possible Chronic Kidney Disease.    Anion gap 11 5 - 15    US Scrotum  08/04/2014   CLINICAL DATA:  Scrotal cellulitis  EXAM:  ULTRASOUND OF SCROTUM  TECHNIQUE: Complete  ultrasound examination of the testicles, epididymis, and other scrotal structures was performed. Doppler imaging was not ordered and therefore was not performed.  COMPARISON:  None  FINDINGS: Right testicle  Measurements: 4.2 x 2.1 x 2.4 cm. Normal morphology without mass. 2 mm calcification within RIGHT testis. Internal blood flow present within RIGHT testis.  Left testicle  Measurements: 4.2 x 3.0 x 3.7 cm. Diffusely heterogeneous echogenicity. Hypervascularity on color Doppler imaging. Findings most consistent with diffuse orchitis. No focal mass or calcification.  Right epididymis:  Normal in size and appearance.  Left epididymis: Hypervascular on color Doppler imaging without focal mass.  Hydrocele: Complicated LEFT peritesticular fluid collection with numerous septations and loculations likely representing a pyocele.  Varicocele:  None identified  Thickened wall of LEFT hemiscrotum with hypervascularity and scattered soft tissue edema consistent with cellulitis.  IMPRESSION: No acute RIGHT testicular abnormalities.  Heterogeneous hypervascular LEFT testis and epididymis compatible with epididymal orchitis.  LEFT scrotal cellulitis.  Markedly complex LEFT scrotal fluid collection likely representing a pyocele.   Electronically Signed   By: Lavonia Dana M.D.   On: 08/04/2014 14:09    Review of Systems  Constitutional: Positive for fever and malaise/fatigue.  HENT: Negative.   Eyes: Negative.   Respiratory: Negative.   Cardiovascular: Negative.   Gastrointestinal: Negative.   Genitourinary: Negative.   Musculoskeletal: Negative.   Skin: Negative.   Neurological: Negative.   Endo/Heme/Allergies: Negative.   Psychiatric/Behavioral: Positive for memory loss.   Blood pressure 99/74, pulse 88, temperature 97.7 F (36.5 C), temperature source Axillary, resp. rate 24, height 5' 9" (1.753 m), weight 66.3 kg (146 lb 2.6 oz), SpO2 100 %. Physical Exam   Constitutional: He appears well-developed and well-nourished.  Stigmata of advanced demenita. Cannot feed himself. Answers abotu 50% of questions appropriately   Eyes: Pupils are equal, round, and reactive to light.  Neck: Normal range of motion.  Cardiovascular: Normal rate.   Respiratory: Effort normal.  pleurex catheter in place  GI: Soft.  Genitourinary:  IPP in place withtout overlying erythema, fluctuence pain over corporal cylinders, resevoir tubing (abobe pubis) or Rt sided pump.  Left testicle with some swelling that is not fixed / fluctuent. NO overlying erythema / drainage whatsoever.   Musculoskeletal: Normal range of motion.  Neurological:  As per above. AO x1.   Skin: Skin is warm.    Assessment/Plan:  1 - Left Epidiymorchitis - exam and imaging suggests presenly clinically localized and w/o infarction. Agree with IV ABX pending more specific CX dara as he is a substanial risk of spread to nearby penile prosthesis which presently shows no signs of infeciton on exam or imaging. Discussed surgical indication with pt and POA being progressive infection despite ABX or obvious infolvement of prosthesis and implications of surgery in his present disease state. Pt, POA, and I all agree surgery last resort option.  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 Will monitor for s/s infection.  3 - Will follow.   At least 45 minutes spent in direct patient care and discussion with POA.   Sheli Dorin 08/04/2014, 5:38 PM

## 2014-08-04 NOTE — Telephone Encounter (Signed)
OBSERVED PATIENT HAS BEEN ADMITTED TO [facility].

## 2014-08-04 NOTE — Progress Notes (Signed)
Patient admitted after midnight. Chart reviewed. Patient examined. Had brown stool without obvious blood per nursing staff. Will start diet. Continue to monitor hemoglobin. Aspirin and Plavix held. Continue antibiotics for scrotal cellulitis. Ultrasound pending. Change Protonix to PPI. Continue step down monitoring for today. DC Foley. Will order to drain Pleurx catheter every other day.  Doree Barthel, MD [facility] [email or web address] Password [email or web address]

## 2014-08-04 NOTE — Progress Notes (Signed)
UR COMPLETED  

## 2014-08-04 NOTE — Progress Notes (Addendum)
ANTIBIOTIC CONSULT NOTE - INITIAL  Pharmacy Consult for Vancomycin/Zosyn  Indication: Cellulitis  Allergies  Allergen Reactions  . Ibuprofen Other (See Comments)    "It made me feel worse"  . Gabapentin     disequilibrium  . Valacyclovir Other (See Comments)    constipation    Patient Measurements: Height: '5\' 9"'$  (175.3 cm) Weight: 146 lb 2.6 oz (66.3 kg) IBW/kg (Calculated) : 70.7  Vital Signs: Temp: 97.4 F (36.3 C) (06/23 2300) Temp Source: Oral (06/23 2300) BP: 115/62 mmHg (06/23 2300) Pulse Rate: 83 (06/23 2300)  Labs: No results for input(s): WBC, HGB, PLT, LABCREA, CREATININE in the last 72 hours. Estimated Creatinine Clearance: 58.9 mL/min (by C-G formula based on Cr of 1). No results for input(s): VANCOTROUGH, VANCOPEAK, VANCORANDOM, GENTTROUGH, GENTPEAK, GENTRANDOM, TOBRATROUGH, TOBRAPEAK, TOBRARND, AMIKACINPEAK, AMIKACINTROU, AMIKACIN in the last 72 hours.   Microbiology: Recent Results (from the past 720 hour(s))  TECHNOLOGIST REVIEW     Status: None   Collection Time: 07/06/14 11:32 AM  Result Value Ref Range Status   Technologist Review rare blast seen on scan  Final    Medical History: Past Medical History  Diagnosis Date  . Hypertension   . GERD (gastroesophageal reflux disease)   . Colon polyps   . Back pain, lumbosacral   . Sinusitis acute 03/24/2011  . Memory loss 04/16/2011  . Allergic state 05/30/2011  . Leukocytosis 12/15/2011  . Hyperlipidemia 02/15/2012  . Unspecified sinusitis (chronic) 04/05/2012  . Epicondylitis 10/10/2012  . Shortness of breath     WITH EXERTION AND BACK PAIN  . Wound drainage     L ELBOW  . Spinal stenosis, lumbar   . Nocturia   . Headache 05/05/2014  . Lung cancer    Assessment: 74 y/o M to start vancomycin and zosyn for scrotal cellulitis. Labs ordered.   Goal of Therapy:  Vancomycin trough level 10-15 mcg/ml  Plan:  -Vancomycin 1000 mg IV x 1, f/u labs for additional dosing -Zosyn 3.375G IV q8h to be infused  over 4 hours -Trend WBC, temp, renal function  -Drug levels as indicated   Narda Bonds 08/04/2014,3:28 AM    =========================   Addendum: - SCr 0.88, CrCL 67 ml/min  Vanc 6/24 >> Zosyn 6/24 >>  6/24 UCx - 6/24 BCx x2 -   Plan: - Vanc '750mg'$  IV Q12H - Continue Zosyn 3.375gm IV Q8H, 4 hr infusion - Monitor renal fxn, clinical progress, vanc trough as indicated    Krystn Dermody D. Mina Marble, PharmD, BCPS Pager:  [phone] 08/04/2014, 9:55 AM

## 2014-08-04 NOTE — Evaluation (Signed)
Physical Therapy Evaluation Patient Details Name: Ian Gonzales MRN: [id number] DOB: 01/04/1941 Today's Date: 08/04/2014   History of Present Illness  Ian Gonzales is a 74 y.o. male with PMH of hypertension, hyperlipidemia, chronic back pain due to lumbar spinal stenosis, GERD, depression, metastatic adenocarcinoma of unknown primary questionable for lung cancer versus gastrointestinal diagnosed in May 2016, recently diagnosed stage IV lung cancer, stroke, COPD, who presents with altered mental status, shortness of breath, bloody stool, scrotal swelling and pain.  CT abdomen/pelvis with contrast showed stable loculated left pleural effusion with associated drainage catheter, mild hydrocele is noted in the left side of scrotum. 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  Clinical Impression  Pt admitted with/for AMS, SOB and scrotal swelling/pain.  Pt currently limited functionally due to the problems listed. ( See problems list.)   Pt will benefit from PT to maximize function and safety in order to get ready for next venue listed below.     Follow Up Recommendations SNF    Equipment Recommendations  Other (comment) (TBA)    Recommendations for Other Services       Precautions / Restrictions Precautions Precautions: Fall      Mobility  Bed Mobility Overal bed mobility: Needs Assistance Bed Mobility: Supine to Sit     Supine to sit: Min guard     General bed mobility comments: for safety  Transfers Overall transfer level: Needs assistance Equipment used: 1 person hand held assist (iv pole) Transfers: Sit to/from [employer] Transfers Sit to Stand: Min assist Stand pivot transfers: Min assist       General transfer comment: stability assist  Ambulation/Gait Ambulation/Gait assistance: Min assist Ambulation Distance (Feet): 8 Feet (4 ' forward and back) Assistive device: 1 person hand held assist (iv pole) Gait Pattern/deviations:  Step-through pattern     General Gait Details: moderately unsteady and unable to stand upright due to scrotal pain  Stairs            Wheelchair Mobility    Modified Rankin (Stroke Patients Only)       Balance Overall balance assessment: Needs assistance Sitting-balance support: No upper extremity supported Sitting balance-Leahy Scale: Fair     Standing balance support: Single extremity supported;Bilateral upper extremity supported Standing balance-Leahy Scale: Poor                               Pertinent Vitals/Pain Pain Assessment: Faces Faces Pain Scale: Hurts even more Pain Location: scrotal pain Pain Descriptors / Indicators: Grimacing;Discomfort Pain Intervention(s): Monitored during session;Limited activity within patient's tolerance    Home Living   Living Arrangements: Group Home                    Prior Function Level of Independence: Independent         Comments: unclear of pt's accuracy     Hand Dominance        Extremity/Trunk Assessment   Upper Extremity Assessment: Defer to OT evaluation           Lower Extremity Assessment: Generalized weakness;Overall WFL for tasks assessed (coordination mildly decreased R)         Communication   Communication: HOH  Cognition Arousal/Alertness: Awake/alert Behavior During Therapy: Restless Overall Cognitive Status: Within Functional Limits for tasks assessed  General Comments General comments (skin integrity, edema, etc.): pt unable to organize his thoughts or finish a sentence completely so is hard to get a history or status presently    Exercises        Assessment/Plan    PT Assessment Patient needs continued PT services  PT Diagnosis Difficulty walking;Generalized weakness;Acute pain   PT Problem List Decreased strength;Decreased activity tolerance;Decreased balance;Decreased mobility;Decreased coordination;Pain;Decreased  knowledge of use of DME  PT Treatment Interventions Gait training;Functional mobility training;Therapeutic activities;Balance training;Patient/family education   PT Goals (Current goals can be found in the Care Plan section) Acute Rehab PT Goals Patient Stated Goal: pt unable to relate his goals PT Goal Formulation: With patient Time For Goal Achievement: 08/18/14 Potential to Achieve Goals: Good    Frequency Min 3X/week   Barriers to discharge Decreased caregiver support      Co-evaluation               End of Session   Activity Tolerance: Patient limited by pain Patient left: in chair;with call bell/phone within reach Nurse Communication: Mobility status         Time: 5003-7048 PT Time Calculation (min) (ACUTE ONLY): 26 min   Charges:   PT Evaluation $Initial PT Evaluation Tier I: 1 Procedure PT Treatments $Therapeutic Activity: 8-22 mins   PT G Codes:        Amberlin Utke, Tessie Fass 08/04/2014, 4:24 PM 08/04/2014  Donnella Sham, [place] [phone]  (pager)

## 2014-08-04 NOTE — Progress Notes (Signed)
Pleurx catheter drained using hospital protocol.  Patient tolerated without event.  Drained 250 cc's of tea color affluent.  Dressing placed, no complaints.

## 2014-08-04 NOTE — Progress Notes (Addendum)
Initial Nutrition Assessment  DOCUMENTATION CODES:  Severe malnutrition in context of chronic illness  INTERVENTION:  Ensure Enlive PO TID, each supplement provides 350 kcal and 20 grams of protein Magic cup TID with meals, each supplement provides 290 kcal and 9 grams of protein  NUTRITION DIAGNOSIS:  Malnutrition related to chronic illness as evidenced by severe depletion of body fat, severe depletion of muscle mass, and 11% weight loss within the past month.  GOAL:  Patient will meet greater than or equal to 90% of their needs  MONITOR:  PO intake, Supplement acceptance, Weight trends, Labs  REASON FOR ASSESSMENT:  Malnutrition Screening Tool, Low Braden    ASSESSMENT:  Patient admitted on 6/23 with AMS, SOB, bloody stool, and scrotal swelling and pain. Hx of metastatic adenocarcinoma of unknown primary (? Lung vs GI), recently diagnosed stage IV lung cancer, stroke, and COPD.   Labs reviewed: BUN elevated.  Nutrition-Focused physical exam completed. Findings are severe fat depletion, severe muscle depletion, and no edema. Patient with 11% weight loss over the past month. He is unable to accurately tell RD how he has been eating, somewhat confused at time of visit. Per RN, he ate none of his breakfast, even when she tried to feed him. He has increased calorie and protein needs to support healing and prevent further depletion of nutrition stores.  Height:  Ht Readings from Last 1 Encounters:  08/03/14 '5\' 9"'$  (1.753 m)    Weight:  Wt Readings from Last 1 Encounters:  08/03/14 146 lb 2.6 oz (66.3 kg)    Ideal Body Weight:  72.7 kg  Wt Readings from Last 10 Encounters:  08/03/14 146 lb 2.6 oz (66.3 kg)  07/24/14 151 lb 14.4 oz (68.901 kg)  07/06/14 163 lb 6.4 oz (74.118 kg)  06/29/14 165 lb 5.5 oz (75 kg)  06/22/14 164 lb 8 oz (74.617 kg)  06/13/14 177 lb 11.1 oz (80.6 kg)  06/09/14 179 lb 8 oz (81.421 kg)  05/26/14 185 lb 1.6 oz (83.961 kg)  05/09/14 184 lb  9.6 oz (83.734 kg)  05/04/14 186 lb (84.369 kg)    BMI:  Body mass index is 21.57 kg/(m^2).  Estimated Nutritional Needs:  Kcal:  2000-2300  Protein:  115-125 gm  Fluid:  >/= 2 L  Skin:   (3 areas of stage 2 skin breakdown on sacrum; related to moisture per RN)  Diet Order:  Diet regular Room service appropriate?: Yes; Fluid consistency:: Thin  EDUCATION NEEDS:  Education needs addressed   Intake/Output Summary (Last 24 hours) at 08/04/14 1052 Last data filed at 08/04/14 0700  Gross per 24 hour  Intake      0 ml  Output    700 ml  Net   -700 ml    Last BM:  6/23   Molli Barrows, RD, LDN, [place] Pager [phone] After Hours Pager [phone]

## 2014-08-05 ENCOUNTER — Inpatient Hospital Stay (HOSPITAL_COMMUNITY): Payer: Medicare Other

## 2014-08-05 DIAGNOSIS — D649 Anemia, unspecified: Secondary | ICD-10-CM | POA: Diagnosis present

## 2014-08-05 DIAGNOSIS — R195 Other fecal abnormalities: Secondary | ICD-10-CM

## 2014-08-05 DIAGNOSIS — E876 Hypokalemia: Secondary | ICD-10-CM | POA: Diagnosis not present

## 2014-08-05 DIAGNOSIS — J449 Chronic obstructive pulmonary disease, unspecified: Secondary | ICD-10-CM

## 2014-08-05 LAB — CBC
HCT: 22 % — ABNORMAL LOW (ref 39.0–52.0)
Hemoglobin: 7.3 g/dL — ABNORMAL LOW (ref 13.0–17.0)
MCH: 28.5 pg (ref 26.0–34.0)
MCHC: 33.2 g/dL (ref 30.0–36.0)
MCV: 85.9 fL (ref 78.0–100.0)
Platelets: 413 10*3/uL — ABNORMAL HIGH (ref 150–400)
RBC: 2.56 MIL/uL — AB (ref 4.22–5.81)
RDW: 14.4 % (ref 11.5–15.5)
WBC: 25.5 10*3/uL — AB (ref 4.0–10.5)

## 2014-08-05 LAB — CBC WITH DIFFERENTIAL/PLATELET
HCT: 21 % — ABNORMAL LOW (ref 39.0–52.0)
Hemoglobin: 7 g/dL — ABNORMAL LOW (ref 13.0–17.0)
MCH: 29 pg (ref 26.0–34.0)
MCHC: 33.3 g/dL (ref 30.0–36.0)
MCV: 87.1 fL (ref 78.0–100.0)
PLATELETS: 404 10*3/uL — AB (ref 150–400)
RBC: 2.41 MIL/uL — ABNORMAL LOW (ref 4.22–5.81)
RDW: 14.4 % (ref 11.5–15.5)
WBC: 29.4 10*3/uL — AB (ref 4.0–10.5)

## 2014-08-05 LAB — HEMOGLOBIN AND HEMATOCRIT, BLOOD
HCT: 21.4 % — ABNORMAL LOW (ref 39.0–52.0)
Hemoglobin: 7.1 g/dL — ABNORMAL LOW (ref 13.0–17.0)

## 2014-08-05 LAB — BASIC METABOLIC PANEL
ANION GAP: 12 (ref 5–15)
BUN: 23 mg/dL — AB (ref 6–20)
CO2: 19 mmol/L — ABNORMAL LOW (ref 22–32)
CREATININE: 0.95 mg/dL (ref 0.61–1.24)
Calcium: 8.1 mg/dL — ABNORMAL LOW (ref 8.9–10.3)
Chloride: 106 mmol/L (ref 101–111)
GFR calc Af Amer: >60 mL/min (ref >60)
GFR calc non Af Amer: >60 mL/min (ref >60)
GLUCOSE: 102 mg/dL — AB (ref 65–99)
Potassium: 3.4 mmol/L — ABNORMAL LOW (ref 3.5–5.1)
Sodium: 137 mmol/L (ref 135–145)

## 2014-08-05 LAB — PREPARE RBC (CROSSMATCH)

## 2014-08-05 MED ORDER — POTASSIUM CHLORIDE CRYS ER 20 MEQ PO TBCR
40.0000 meq | EXTENDED_RELEASE_TABLET | Freq: Once | ORAL | Status: AC
  Administered 2014-08-05: 40 meq via ORAL
  Filled 2014-08-05: qty 2

## 2014-08-05 MED ORDER — MIRTAZAPINE 7.5 MG PO TABS
7.5000 mg | ORAL_TABLET | Freq: Every day | ORAL | Status: DC
  Administered 2014-08-05 – 2014-08-10 (×6): 7.5 mg via ORAL
  Filled 2014-08-05 (×7): qty 1

## 2014-08-05 MED ORDER — SERTRALINE HCL 50 MG PO TABS
50.0000 mg | ORAL_TABLET | Freq: Every morning | ORAL | Status: DC
  Administered 2014-08-05 – 2014-08-11 (×7): 50 mg via ORAL
  Filled 2014-08-05 (×7): qty 1

## 2014-08-05 MED ORDER — PRO-STAT SUGAR FREE PO LIQD
60.0000 mL | Freq: Three times a day (TID) | ORAL | Status: DC
  Administered 2014-08-05 – 2014-08-08 (×2): 60 mL via ORAL
  Filled 2014-08-05 (×18): qty 60

## 2014-08-05 MED ORDER — SALINE SPRAY 0.65 % NA SOLN
1.0000 | NASAL | Status: DC | PRN
  Filled 2014-08-05: qty 44

## 2014-08-05 MED ORDER — SODIUM CHLORIDE 0.9 % IV SOLN: Freq: Once | INTRAVENOUS | Status: DC

## 2014-08-05 MED ORDER — POLYETHYLENE GLYCOL 3350 17 G PO PACK
17.0000 g | PACK | Freq: Every morning | ORAL | Status: DC
  Administered 2014-08-05 – 2014-08-06 (×2): 17 g via ORAL
  Filled 2014-08-05 (×3): qty 1

## 2014-08-05 MED ORDER — LORAZEPAM 0.5 MG PO TABS
0.5000 mg | ORAL_TABLET | Freq: Four times a day (QID) | ORAL | Status: DC | PRN
  Administered 2014-08-10: 0.5 mg via ORAL
  Filled 2014-08-05: qty 1

## 2014-08-05 MED ORDER — BISACODYL 5 MG PO TBEC
10.0000 mg | DELAYED_RELEASE_TABLET | Freq: Every day | ORAL | Status: DC | PRN
Start: 2014-08-05 — End: 2014-08-11

## 2014-08-05 MED ORDER — IPRATROPIUM-ALBUTEROL 0.5-2.5 (3) MG/3ML IN SOLN
3.0000 mL | Freq: Three times a day (TID) | RESPIRATORY_TRACT | Status: DC
  Administered 2014-08-05 – 2014-08-07 (×7): 3 mL via RESPIRATORY_TRACT
  Filled 2014-08-05 (×7): qty 3

## 2014-08-05 NOTE — Progress Notes (Signed)
Subjective:  1 - Left Epidiymorchitis - pt admitted from facility with few days metnal status chagnes and some left scrotal discomfort found to have likely left epidiymorchitis by CT and [place]. Leukocytosis to 27K. UCX, BCX obtainend / pending. Now on empiric Vanc + Zosyn. 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  Imaging this admission with impressive left testicular and epidymal hyperemia and complex / reactive hydrocele. This fortunately appears away from IPP resevoir (rt pre pubic space), pump (rt hemicrotum), and cylinders.  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   PMH sig for metastatic adenocarcinoma (recieving chemo recently),COPD, Plurex catheter for chronic effusion, GI Bleed, severe dementia (lives in facility),. His POA is virgina Hall Busing (friend) at [phone]  Today Ian Gonzales is stable. Denies scrotal discomfort. His main complaint is that his friend has his teeth and makes it hard to eat. Also c/o some chronic shortness of breath. He continues have very little appreciation of the gravity of his medical issues.  Objective: Vital signs in last 24 hours: Temp:  [97 F (36.1 C)-98.2 F (36.8 C)] 98 F (36.7 C) (06/25 0700) Pulse Rate:  [72-81] 81 (06/25 0328) Resp:  [18-28] 25 (06/25 0328) BP: (106-113)/(55-58) 106/55 mmHg (06/25 0328) SpO2:  [98 %-100 %] 98 % (06/25 0903) Last BM Date: 08/03/14  Intake/Output from previous day: 06/24 0701 - 06/25 0700 In: 3290.8 [P.O.:120; I.V.:2870.8; IV Piggyback:300] Out: 250 [Urine:250] Intake/Output this shift:    General appearance: alert, cooperative and AOx1 Eyes: negative Nose: Nares normal. Septum midline. Mucosa normal. No drainage or sinus tenderness. Throat: lips, mucosa, and tongue normal; teeth and gums normal Resp: non-labored on [place] O2. Pleurex in place. GI: soft,  non-tender; bowel sounds normal; no masses,  no organomegaly Male genitalia: Left testicle with stable induration and no overlyign skin changes drainage or fluctuence. Right sided IPP pump, IPP cylinders, and IPP tubing towards resevoir w/o indurration or overlyign skin changes. Extremities: extremities normal, atraumatic, no cyanosis or edema Pulses: 2+ and symmetric Neurologic: Mental status: Alert, oriented, thought content appropriate, AO x1, stbale.   Lab Results:   Recent Labs  08/04/14 0701 08/05/14 0352  WBC 27.9* 29.4*  HGB 9.0* 7.0*  HCT 26.9* 21.0*  PLT 319 404*   BMET  Recent Labs  08/04/14 0701 08/05/14 0352  NA 136 137  K 3.6 3.4*  CL 103 106  CO2 22 19*  GLUCOSE 122* 102*  BUN 30* 23*  CREATININE 0.88 0.95  CALCIUM 8.2* 8.1*   PT/INR  Recent Labs  08/04/14 0701  LABPROT 18.1*  INR 1.49   ABG No results for input(s): PHART, HCO3 in the last 72 hours.  Invalid input(s): PCO2, PO2  Studies/Results: US Scrotum  08/04/2014   CLINICAL DATA:  Scrotal cellulitis  EXAM: ULTRASOUND OF SCROTUM  TECHNIQUE: Complete ultrasound examination of the testicles, epididymis, and other scrotal structures was performed. Doppler imaging was not ordered and therefore was not performed.  COMPARISON:  None  FINDINGS: Right testicle  Measurements: 4.2 x 2.1 x 2.4 cm. Normal morphology without mass. 2 mm calcification within RIGHT testis. Internal blood flow present within RIGHT testis.  Left testicle  Measurements: 4.2 x 3.0 x 3.7 cm. Diffusely heterogeneous echogenicity. Hypervascularity on color Doppler imaging. Findings most consistent with diffuse orchitis. No focal mass or calcification.  Right epididymis:  Normal in size and appearance.  Left epididymis: Hypervascular on color Doppler imaging without focal mass.  Hydrocele: Complicated LEFT peritesticular fluid collection with numerous septations and loculations likely representing a pyocele.  Varicocele:  None identified   Thickened wall of LEFT hemiscrotum with hypervascularity and scattered soft tissue edema consistent with cellulitis.  IMPRESSION: No acute RIGHT testicular abnormalities.  Heterogeneous hypervascular LEFT testis and epididymis compatible with epididymal orchitis.  LEFT scrotal cellulitis.  Markedly complex LEFT scrotal fluid collection likely representing a pyocele.   Electronically Signed   By: Lavonia Dana M.D.   On: 08/04/2014 14:09   Dg Chest Port 1 View  08/05/2014   CLINICAL DATA:  Shortness of Breath  EXAM: PORTABLE CHEST - 1 VIEW  COMPARISON:  08/03/2014  FINDINGS: Left-sided pleural effusion is again identified. A drainage catheter is noted in place. No pneumothorax is noted. Right lung is clear. The cardiac shadow is stable. No acute bony abnormality is seen.  IMPRESSION: Stable left pleural effusion with drainage catheter in place.   Electronically Signed   By: Inez Catalina M.D.   On: 08/05/2014 08:40    Anti-infectives: Anti-infectives    Start     Dose/Rate Route Frequency Ordered Stop   08/04/14 1800  vancomycin (VANCOCIN) IVPB 750 mg/150 ml premix     750 mg 150 mL/hr over 60 Minutes Intravenous Every 12 hours 08/04/14 0955     08/04/14 0345  vancomycin (VANCOCIN) IVPB 1000 mg/200 mL premix     1,000 mg 200 mL/hr over 60 Minutes Intravenous  Once 08/04/14 0330 08/04/14 0727   08/04/14 0345  piperacillin-tazobactam (ZOSYN) IVPB 3.375 g     3.375 g 12.5 mL/hr over 240 Minutes Intravenous 3 times per day 08/04/14 0330        Assessment/Plan:  1 - Left Epidiymorchitis - exam and imaging suggests presenly clinically localized and w/o infarction. Agree with IV ABX pending more specific CX dtra as he is a substanial risk of spread to nearby penile prosthesis which presently shows no signs of infeciton on exam or imaging. Rediscussed surgical indication with pt being progressive infection despite ABX or obvious infolvement of prosthesis and implications of surgery in his present disease  state. Pt and his POA also express very high threshold for surgery and I agree.   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 Will monitor for s/s infection.  3 - Will follow.    [facility], Calissa Swenor 08/05/2014

## 2014-08-05 NOTE — Progress Notes (Signed)
[facility] PROGRESS NOTE  Ian Gonzales JQZ:[id number] DOB: 12/08/1940 DOA: 08/03/2014 PCP: Penni Homans, MD  Assessment/Plan:  Principal Problem:   Epididymo-orchitis, acute with sepsis: continue broad spectrum abx. cx pending.  Appreciate Dr. Tresa Moore Active Problems: Dyspnea: repeat CXR. R/o pulmonary edema. Schedule duonebs. May be anemia related. Repeat h/h. May need transfusion   ERECTILE DYSFUNCTION   Essential hypertension   COPD, dyspneic. Schedule nebs   GERD   BPH (benign prostatic hyperplasia)   Spinal stenosis, lumbar region, with neurogenic claudication   Hereditary and idiopathic peripheral neuropathy   Malignant pleural effusion: drain pleurex q2 days   Recent CVA (cerebral vascular accident) ASA and plavix held for heme pos stool and anemia   Carotid stenosis   Acute encephalopathy secondary to anemia, infection: still confused. Somewhat agitated. Will order PRN ativan   Lung cancer on chemo   Tobacco abuse   Heme positive stool: no reported melena or hematochezia. GI consult on hold monitor h/h. Continue PPI. ASA plavix, naprosyn held   Protein-calorie malnutrition, severe: resume remeron   Pressure ulcer, sacral per RN POA   Anemia, multifactorial: chemo, heme pos stool, acute illness: may need transfusion. Will repeat   Hypokalemia: correct Resume zoloft  Code Status:  full Family Communication:   Disposition Plan:  home  Consultants:  urology  Procedures:     Antibiotics:  Vanc, zosyn 6/24  HPI/Subjective: C/o SOB, couldn't sleep.too many interruptions  Objective: Filed Vitals:   08/05/14 0700  BP:   Pulse:   Temp: 98 F (36.7 C)  Resp:     Intake/Output Summary (Last 24 hours) at 08/05/14 0819 Last data filed at 08/05/14 0600  Gross per 24 hour  Intake 3165.83 ml  Output    250 ml  Net 2915.83 ml   Filed Weights   08/03/14 2300  Weight: 66.3 kg (146 lb 2.6 oz)    Exam:   General:  Asleep. Initially comfortable.  When awoken, became agitated and somewhat tachypneic. confused  Cardiovascular: RRR without MGR  Respiratory: CTA without WRR  Abdomen: s, nt, nd  GU: no obvious cellulitis of scrotum. Tender to palpation. Foley out  Ext: no CCE  Basic Metabolic Panel:  Recent Labs Lab 08/04/14 0701 08/05/14 0352  NA 136 137  K 3.6 3.4*  CL 103 106  CO2 22 19*  GLUCOSE 122* 102*  BUN 30* 23*  CREATININE 0.88 0.95  CALCIUM 8.2* 8.1*   Liver Function Tests: No results for input(s): AST, ALT, ALKPHOS, BILITOT, PROT, ALBUMIN in the last 168 hours. No results for input(s): LIPASE, AMYLASE in the last 168 hours. No results for input(s): AMMONIA in the last 168 hours. CBC:  Recent Labs Lab 08/04/14 0701 08/05/14 0352  WBC 27.9* 29.4*  HGB 9.0* 7.0*  HCT 26.9* 21.0*  MCV 85.7 87.1  PLT 319 404*   Cardiac Enzymes: No results for input(s): CKTOTAL, CKMB, CKMBINDEX, TROPONINI in the last 168 hours. BNP (last 3 results)  Recent Labs  05/30/14 1630  BNP 25.2    ProBNP (last 3 results) No results for input(s): PROBNP in the last 8760 hours.  CBG: No results for input(s): GLUCAP in the last 168 hours.  Recent Results (from the past 240 hour(s))  MRSA PCR Screening     Status: None   Collection Time: 08/03/14 11:09 PM  Result Value Ref Range Status   MRSA by PCR NEGATIVE NEGATIVE Final    Comment:        The GeneXpert MRSA Assay (  FDA approved for NASAL specimens only), is one component of a comprehensive MRSA colonization surveillance program. It is not intended to diagnose MRSA infection nor to guide or monitor treatment for MRSA infections.      Studies: US Scrotum  08/04/2014   CLINICAL DATA:  Scrotal cellulitis  EXAM: ULTRASOUND OF SCROTUM  TECHNIQUE: Complete ultrasound examination of the testicles, epididymis, and other scrotal structures was performed. Doppler imaging was not ordered and therefore was not performed.  COMPARISON:  None  FINDINGS: Right testicle   Measurements: 4.2 x 2.1 x 2.4 cm. Normal morphology without mass. 2 mm calcification within RIGHT testis. Internal blood flow present within RIGHT testis.  Left testicle  Measurements: 4.2 x 3.0 x 3.7 cm. Diffusely heterogeneous echogenicity. Hypervascularity on color Doppler imaging. Findings most consistent with diffuse orchitis. No focal mass or calcification.  Right epididymis:  Normal in size and appearance.  Left epididymis: Hypervascular on color Doppler imaging without focal mass.  Hydrocele: Complicated LEFT peritesticular fluid collection with numerous septations and loculations likely representing a pyocele.  Varicocele:  None identified  Thickened wall of LEFT hemiscrotum with hypervascularity and scattered soft tissue edema consistent with cellulitis.  IMPRESSION: No acute RIGHT testicular abnormalities.  Heterogeneous hypervascular LEFT testis and epididymis compatible with epididymal orchitis.  LEFT scrotal cellulitis.  Markedly complex LEFT scrotal fluid collection likely representing a pyocele.   Electronically Signed   By: Lavonia Dana M.D.   On: 08/04/2014 14:09    Scheduled Meds: . acetaminophen  650 mg Oral q morning - 10a  . amitriptyline  10 mg Oral QHS  . atorvastatin  20 mg Oral q1800  . dextromethorphan-guaiFENesin  1 tablet Oral BID  . feeding supplement (ENSURE ENLIVE)  237 mL Oral TID WC  . folic acid  1 mg Oral Daily  . nicotine  14 mg Transdermal Daily  . pantoprazole  40 mg Oral BID AC  . piperacillin-tazobactam (ZOSYN)  IV  3.375 g Intravenous 3 times per day  . vancomycin  750 mg Intravenous Q12H   Continuous Infusions: . sodium chloride 125 mL/hr at 08/05/14 0600    Time spent: 35 minutes  [facility] [email or web address], password [facility] 08/05/2014, 8:19 AM  LOS: 2 days

## 2014-08-06 DIAGNOSIS — D6489 Other specified anemias: Secondary | ICD-10-CM

## 2014-08-06 LAB — URINE CULTURE: Culture: 70000

## 2014-08-06 LAB — CBC WITH DIFFERENTIAL/PLATELET
BASOS ABS: 0 10*3/uL (ref 0.0–0.1)
Basophils Relative: 0 % (ref 0–1)
EOS ABS: 0 10*3/uL (ref 0.0–0.7)
Eosinophils Relative: 0 % (ref 0–5)
HCT: 22.9 % — ABNORMAL LOW (ref 39.0–52.0)
HEMOGLOBIN: 7.5 g/dL — AB (ref 13.0–17.0)
LYMPHS ABS: 1.9 10*3/uL (ref 0.7–4.0)
LYMPHS PCT: 8 % — AB (ref 12–46)
MCH: 28.3 pg (ref 26.0–34.0)
MCHC: 32.8 g/dL (ref 30.0–36.0)
MCV: 86.4 fL (ref 78.0–100.0)
MONO ABS: 2.2 10*3/uL — AB (ref 0.1–1.0)
MONOS PCT: 9 % (ref 3–12)
NEUTROS ABS: 19.9 10*3/uL — AB (ref 1.7–7.7)
Neutrophils Relative %: 83 % — ABNORMAL HIGH (ref 43–77)
Platelets: 444 10*3/uL — ABNORMAL HIGH (ref 150–400)
RBC: 2.65 MIL/uL — AB (ref 4.22–5.81)
RDW: 14.4 % (ref 11.5–15.5)
WBC: 24 10*3/uL — ABNORMAL HIGH (ref 4.0–10.5)

## 2014-08-06 LAB — BASIC METABOLIC PANEL
Anion gap: 9 (ref 5–15)
BUN: 17 mg/dL (ref 6–20)
CALCIUM: 8 mg/dL — AB (ref 8.9–10.3)
CHLORIDE: 106 mmol/L (ref 101–111)
CO2: 21 mmol/L — AB (ref 22–32)
CREATININE: 0.86 mg/dL (ref 0.61–1.24)
GLUCOSE: 108 mg/dL — AB (ref 65–99)
POTASSIUM: 3.4 mmol/L — AB (ref 3.5–5.1)
Sodium: 136 mmol/L (ref 135–145)

## 2014-08-06 LAB — MAGNESIUM: Magnesium: 1.7 mg/dL (ref 1.7–2.4)

## 2014-08-06 LAB — GENTAMICIN LEVEL, RANDOM: Gentamicin Rm: 6.4 ug/mL

## 2014-08-06 MED ORDER — GENTAMICIN SULFATE 40 MG/ML IJ SOLN
7.0000 mg/kg | Freq: Once | INTRAVENOUS | Status: AC
  Administered 2014-08-06: 460 mg via INTRAVENOUS
  Filled 2014-08-06: qty 11.5

## 2014-08-06 MED ORDER — POTASSIUM CHLORIDE CRYS ER 20 MEQ PO TBCR
40.0000 meq | EXTENDED_RELEASE_TABLET | Freq: Once | ORAL | Status: AC
  Administered 2014-08-06: 40 meq via ORAL
  Filled 2014-08-06: qty 2

## 2014-08-06 MED ORDER — GENTAMICIN SULFATE 40 MG/ML IJ SOLN
460.0000 mg | INTRAMUSCULAR | Status: DC
  Administered 2014-08-07: 460 mg via INTRAVENOUS
  Filled 2014-08-06: qty 11.5

## 2014-08-06 NOTE — Progress Notes (Signed)
ANTIBIOTIC CONSULT NOTE - INITIAL  Pharmacy Consult for Gentamicin Indication: Pseudomonal orchitis  Allergies  Allergen Reactions  . Gabapentin Other (See Comments)    Dysfunction of equilibrium   . Ibuprofen Other (See Comments)    "It made me feel worse"  . Valacyclovir Other (See Comments)    constipation    Patient Measurements: Height: '5\' 9"'$  (175.3 cm) Weight: 146 lb 2.6 oz (66.3 kg) IBW/kg (Calculated) : 70.7  Vital Signs: Temp: 98.3 F (36.8 C) (06/26 0847) Temp Source: Oral (06/26 0847) BP: 121/63 mmHg (06/26 0347) Pulse Rate: 91 (06/26 0847) Intake/Output from previous day: 06/25 0701 - 06/26 0700 In: 823.2 [I.V.:488.2; Blood:335] Out: 200 [Urine:200] Intake/Output from this shift: Total I/O In: -  Out: 300 [Urine:300]  Labs:  Recent Labs  08/04/14 0701 08/05/14 0352 08/05/14 0913 08/05/14 2150 08/06/14 0230  WBC 27.9* 29.4*  --  25.5* 24.0*  HGB 9.0* 7.0* 7.1* 7.3* 7.5*  PLT 319 404*  --  413* 444*  CREATININE 0.88 0.95  --   --  0.86   Estimated Creatinine Clearance: 68.5 mL/min (by C-G formula based on Cr of 0.86). No results for input(s): VANCOTROUGH, VANCOPEAK, VANCORANDOM, GENTTROUGH, GENTPEAK, GENTRANDOM, TOBRATROUGH, TOBRAPEAK, TOBRARND, AMIKACINPEAK, AMIKACINTROU, AMIKACIN in the last 72 hours.   Microbiology: Recent Results (from the past 720 hour(s))  MRSA PCR Screening     Status: None   Collection Time: 08/03/14 11:09 PM  Result Value Ref Range Status   MRSA by PCR NEGATIVE NEGATIVE Final    Comment:        The GeneXpert MRSA Assay (FDA approved for NASAL specimens only), is one component of a comprehensive MRSA colonization surveillance program. It is not intended to diagnose MRSA infection nor to guide or monitor treatment for MRSA infections.   Urine culture     Status: None (Preliminary result)   Collection Time: 08/04/14  5:04 AM  Result Value Ref Range Status   Specimen Description URINE, RANDOM  Final   Special  Requests NONE  Final   Culture 70,000 COLONIES/ml PSEUDOMONAS AERUGINOSA  Final   Report Status PENDING  Incomplete  Culture, blood (x 2)     Status: None (Preliminary result)   Collection Time: 08/04/14  6:45 AM  Result Value Ref Range Status   Specimen Description BLOOD LEFT HAND  Final   Special Requests BOTTLES DRAWN AEROBIC ONLY 8CC  Final   Culture NO GROWTH 1 DAY  Final   Report Status PENDING  Incomplete  Culture, blood (x 2)     Status: None (Preliminary result)   Collection Time: 08/04/14  6:54 AM  Result Value Ref Range Status   Specimen Description BLOOD LEFT HAND  Final   Special Requests BOTTLES DRAWN AEROBIC ONLY 4CC  Final   Culture NO GROWTH 1 DAY  Final   Report Status PENDING  Incomplete    Medical History: Past Medical History  Diagnosis Date  . Hypertension   . GERD (gastroesophageal reflux disease)   . Colon polyps   . Back pain, lumbosacral   . Sinusitis acute 03/24/2011  . Memory loss 04/16/2011  . Allergic state 05/30/2011  . Leukocytosis 12/15/2011  . Hyperlipidemia 02/15/2012  . Unspecified sinusitis (chronic) 04/05/2012  . Epicondylitis 10/10/2012  . Shortness of breath     WITH EXERTION AND BACK PAIN  . Wound drainage     L ELBOW  . Spinal stenosis, lumbar   . Nocturia   . Headache 05/05/2014  . Lung cancer  Assessment: 74 y/o M presented a few days ago with mental status changes and some L scrotal discomfort. Found to likely have L epidiymorchitis by CT and [place]. Started on vancomycin and zosyn but now to switch to gentamicin and zosyn for pseudomonal orchitis per Urology recs. Will use extended interval dosing for the extra gram negative coverage. Cx's still pending. SCr is 0.86, CrCl ~17m/min. Pt weighs 66 kg.   Goal of Therapy:  Resolution of infection  Plan:  Give gentamicin '460mg'$  IV x 1 Check 10 hr random level at 2000 tonight Monitor clinical picture, renal function F/U abx LOT  Arek Spadafore J 08/06/2014,8:55 AM

## 2014-08-06 NOTE — Progress Notes (Signed)
Subjective:  1 - Left Epidiymorchitis - pt admitted from facility with few days metnal status chagnes and some left scrotal discomfort found to have likely left epidiymorchitis by CT and [place]. Leukocytosis to 27K. UCX, BCX obtainend and with some psudomonas growth, final pending. On empiric Vanc + Zosyn on admission, transitioned to [place] 6/26 for maximal psudomonas coverage. 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  Imaging this admission with impressive left testicular and epidymal hyperemia and complex / reactive hydrocele. This fortunately appears away from IPP resevoir (rt pre pubic space), pump (rt hemicrotum), and cylinders.  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   PMH sig for metastatic adenocarcinoma (recieving chemo recently),COPD, Plurex catheter for chronic effusion, GI Bleed, severe dementia (lives in facility),. His POA is virgina Hall Busing (friend) at [phone]  Today Ian Gonzales is is better spirits. Less agitated, though still with stigmata of severe dementia. UCX with psudomonas.   Objective: Vital signs in last 24 hours: Temp:  [97.5 F (36.4 C)-98.3 F (36.8 C)] 98.3 F (36.8 C) (06/26 0847) Pulse Rate:  [80-91] 91 (06/26 0847) Resp:  [15-25] 18 (06/26 0847) BP: (107-121)/(55-65) 121/63 mmHg (06/26 0347) SpO2:  [95 %-100 %] 100 % (06/26 0847) Last BM Date: 08/03/14  Intake/Output from previous day: 06/25 0701 - 06/26 0700 In: 823.2 [I.V.:488.2; Blood:335] Out: 200 [Urine:200] Intake/Output this shift: Total I/O In: -  Out: 300 [Urine:300]  General appearance: alert and cooperative Eyes: negative Throat: lips, mucosa, and tongue normal; teeth and gums normal Back: symmetric, no curvature. ROM normal. No CVA tenderness. Resp: Non-labored on [place] O2 Cardio: Nl rate GI: soft, non-tender; bowel sounds normal;  no masses,  no organomegaly Male genitalia: left testicle / cord wtih stable induration. No drainage or skin changes. Induration still does not extend to rt scrotum (IPP pump), corpora, or SP tunneled tubing.  Extremities: extremities normal, atraumatic, no cyanosis or edema Lymph nodes: Cervical, supraclavicular, and axillary nodes normal. Neurologic: Mental status: Alert, oriented, thought content appropriate, AOx1, stable.  Missing most teeth, dentures not in place  Lab Results:   Recent Labs  08/05/14 2150 08/06/14 0230  WBC 25.5* 24.0*  HGB 7.3* 7.5*  HCT 22.0* 22.9*  PLT 413* 444*   BMET  Recent Labs  08/05/14 0352 08/06/14 0230  NA 137 136  K 3.4* 3.4*  CL 106 106  CO2 19* 21*  GLUCOSE 102* 108*  BUN 23* 17  CREATININE 0.95 0.86  CALCIUM 8.1* 8.0*   PT/INR  Recent Labs  08/04/14 0701  LABPROT 18.1*  INR 1.49   ABG No results for input(s): PHART, HCO3 in the last 72 hours.  Invalid input(s): PCO2, PO2  Studies/Results: US Scrotum  08/04/2014   CLINICAL DATA:  Scrotal cellulitis  EXAM: ULTRASOUND OF SCROTUM  TECHNIQUE: Complete ultrasound examination of the testicles, epididymis, and other scrotal structures was performed. Doppler imaging was not ordered and therefore was not performed.  COMPARISON:  None  FINDINGS: Right testicle  Measurements: 4.2 x 2.1 x 2.4 cm. Normal morphology without mass. 2 mm calcification within RIGHT testis. Internal blood flow present within RIGHT testis.  Left testicle  Measurements: 4.2 x 3.0 x 3.7 cm. Diffusely heterogeneous echogenicity. Hypervascularity on color Doppler imaging. Findings most consistent with diffuse orchitis. No focal mass or calcification.  Right epididymis:  Normal in size and appearance.  Left epididymis: Hypervascular on color Doppler imaging without focal mass.  Hydrocele: Complicated LEFT peritesticular fluid collection with numerous septations and loculations likely representing a pyocele.  Varicocele:   None identified  Thickened wall of LEFT hemiscrotum with hypervascularity and scattered soft tissue edema consistent with cellulitis.  IMPRESSION: No acute RIGHT testicular abnormalities.  Heterogeneous hypervascular LEFT testis and epididymis compatible with epididymal orchitis.  LEFT scrotal cellulitis.  Markedly complex LEFT scrotal fluid collection likely representing a pyocele.   Electronically Signed   By: Lavonia Dana M.D.   On: 08/04/2014 14:09   Dg Chest Port 1 View  08/05/2014   CLINICAL DATA:  Shortness of Breath  EXAM: PORTABLE CHEST - 1 VIEW  COMPARISON:  08/03/2014  FINDINGS: Left-sided pleural effusion is again identified. A drainage catheter is noted in place. No pneumothorax is noted. Right lung is clear. The cardiac shadow is stable. No acute bony abnormality is seen.  IMPRESSION: Stable left pleural effusion with drainage catheter in place.   Electronically Signed   By: Inez Catalina M.D.   On: 08/05/2014 08:40    Anti-infectives: Anti-infectives    Start     Dose/Rate Route Frequency Ordered Stop   08/06/14 1000  gentamicin (GARAMYCIN) 460 mg in dextrose 5 % 100 mL IVPB     7 mg/kg  66.3 kg 111.5 mL/hr over 60 Minutes Intravenous  Once 08/06/14 0906     08/04/14 1800  vancomycin (VANCOCIN) IVPB 750 mg/150 ml premix  Status:  Discontinued     750 mg 150 mL/hr over 60 Minutes Intravenous Every 12 hours 08/04/14 0955 08/06/14 0825   08/04/14 0345  vancomycin (VANCOCIN) IVPB 1000 mg/200 mL premix     1,000 mg 200 mL/hr over 60 Minutes Intravenous  Once 08/04/14 0330 08/04/14 0727   08/04/14 0345  piperacillin-tazobactam (ZOSYN) IVPB 3.375 g     3.375 g 12.5 mL/hr over 240 Minutes Intravenous 3 times per day 08/04/14 0330        Assessment/Plan:  1 - Left Epidiymorchitis - serial exam and imaging suggests presenly clinically localized and w/o infarction. Dr. Conley Canal and I have discussed prelim UCX findings and agree on double coverage for psudomonas pending sensitivites.     Reinforced surgical indication with pt being progressive infection despite ABX or obvious infolvement of prosthesis and implications of surgery in his present disease state. Pt and his POA remain with very high threshold for surgery and I agree.   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 Will monitor for s/s infection with serial exams as per above.  3 - Will follow.   [facility], Violet Cart 08/06/2014

## 2014-08-06 NOTE — Progress Notes (Signed)
[facility] PROGRESS NOTE  MALIEK SCHELLHORN CBJ:[id number] DOB: May 18, 1940 DOA: 08/03/2014 PCP: Penni Homans, MD  Assessment/Plan:  Principal Problem:   Epididymo-orchitis, acute with sepsis: D/W Dr. Tresa Moore. Urine cx growing pseudomonas. He recommends gent to double cover. D/c vancomycin. Continue zosyn.  Penile implant uninfected. Blood cultures remain negative. Active Problems: Dyspnea: Seems improved today. Chest x-ray without anything acute.   Essential hypertension   COPD, stable   GERD   BPH (benign prostatic hyperplasia)   Spinal stenosis, lumbar region, with neurogenic claudication   Hereditary and idiopathic peripheral neuropathy   Malignant pleural effusion: drain pleurex q2 days   Recent CVA (cerebral vascular accident) ASA and plavix held for heme pos stool and anemia   Carotid stenosis   Acute encephalopathy secondary to anemia, infection: still confused. Baseline unclear.   Lung cancer on chemo, followed by Dr. Earlie Server   Tobacco abuse   Heme positive stool: no reported melena or hematochezia. GI consult on hold monitor h/h. Continue PPI. ASA plavix, naprosyn held   Protein-calorie malnutrition, severe: resume remeron   Pressure ulcer, sacral per RN POA   Anemia, multifactorial: chemo, heme pos stool, acute illness: Transfuse 1 unit packed red blood cells yesterday. No reports of hematochezia.   Hypokalemia: correct Resume zoloft  Code Status:  full Family Communication:   Disposition Plan:  Back to SNF when more stable  Consultants:  urology  Procedures:     Antibiotics:  Vanc 6/24 - 6/26  zosyn 6/24 -  Gentamycin 6/26  HPI/Subjective: Denies dyspnea. Feels weak and worn out. No bleeding. No pain.  Objective: Filed Vitals:   08/06/14 0847  BP:   Pulse: 91  Temp: 98.3 F (36.8 C)  Resp: 18    Intake/Output Summary (Last 24 hours) at 08/06/14 0958 Last data filed at 08/06/14 0849  Gross per 24 hour  Intake 688.17 ml  Output     500 ml  Net 188.17 ml   Filed Weights   08/03/14 2300  Weight: 66.3 kg (146 lb 2.6 oz)    Exam:   General:  Asleep. Arousable. Confused. Appears comfortable.  Cardiovascular: RRR without MGR  Respiratory: CTA without WRR  Abdomen: s, nt, nd  GU: no obvious cellulitis of scrotum. Tender to palpation.   Ext: no CCE  Basic Metabolic Panel:  Recent Labs Lab 08/04/14 0701 08/05/14 0352 08/06/14 0230  NA 136 137 136  K 3.6 3.4* 3.4*  CL 103 106 106  CO2 22 19* 21*  GLUCOSE 122* 102* 108*  BUN 30* 23* 17  CREATININE 0.88 0.95 0.86  CALCIUM 8.2* 8.1* 8.0*  MG  --   --  1.7   Liver Function Tests: No results for input(s): AST, ALT, ALKPHOS, BILITOT, PROT, ALBUMIN in the last 168 hours. No results for input(s): LIPASE, AMYLASE in the last 168 hours. No results for input(s): AMMONIA in the last 168 hours. CBC:  Recent Labs Lab 08/04/14 0701 08/05/14 0352 08/05/14 0913 08/05/14 2150 08/06/14 0230  WBC 27.9* 29.4*  --  25.5* 24.0*  NEUTROABS  --   --   --   --  19.9*  HGB 9.0* 7.0* 7.1* 7.3* 7.5*  HCT 26.9* 21.0* 21.4* 22.0* 22.9*  MCV 85.7 87.1  --  85.9 86.4  PLT 319 404*  --  413* 444*   Cardiac Enzymes: No results for input(s): CKTOTAL, CKMB, CKMBINDEX, TROPONINI in the last 168 hours. BNP (last 3 results)  Recent Labs  05/30/14 1630  BNP 25.2  ProBNP (last 3 results) No results for input(s): PROBNP in the last 8760 hours.  CBG: No results for input(s): GLUCAP in the last 168 hours.  Recent Results (from the past 240 hour(s))  MRSA PCR Screening     Status: None   Collection Time: 08/03/14 11:09 PM  Result Value Ref Range Status   MRSA by PCR NEGATIVE NEGATIVE Final    Comment:        The GeneXpert MRSA Assay (FDA approved for NASAL specimens only), is one component of a comprehensive MRSA colonization surveillance program. It is not intended to diagnose MRSA infection nor to guide or monitor treatment for MRSA infections.   Urine  culture     Status: None   Collection Time: 08/04/14  5:04 AM  Result Value Ref Range Status   Specimen Description URINE, RANDOM  Final   Special Requests NONE  Final   Culture 70,000 COLONIES/ml PSEUDOMONAS AERUGINOSA  Final   Report Status 08/06/2014 FINAL  Final   Organism ID, Bacteria PSEUDOMONAS AERUGINOSA  Final      Susceptibility   Pseudomonas aeruginosa - MIC*    CEFTAZIDIME 4 SENSITIVE Sensitive     CIPROFLOXACIN <=0.25 SENSITIVE Sensitive     GENTAMICIN <=1 SENSITIVE Sensitive     IMIPENEM 1 SENSITIVE Sensitive     PIP/TAZO 8 SENSITIVE Sensitive     CEFEPIME 2 SENSITIVE Sensitive     * 70,000 COLONIES/ml PSEUDOMONAS AERUGINOSA  Culture, blood (x 2)     Status: None (Preliminary result)   Collection Time: 08/04/14  6:45 AM  Result Value Ref Range Status   Specimen Description BLOOD LEFT HAND  Final   Special Requests BOTTLES DRAWN AEROBIC ONLY 8CC  Final   Culture NO GROWTH 1 DAY  Final   Report Status PENDING  Incomplete  Culture, blood (x 2)     Status: None (Preliminary result)   Collection Time: 08/04/14  6:54 AM  Result Value Ref Range Status   Specimen Description BLOOD LEFT HAND  Final   Special Requests BOTTLES DRAWN AEROBIC ONLY 4CC  Final   Culture NO GROWTH 1 DAY  Final   Report Status PENDING  Incomplete     Studies: US Scrotum  08/04/2014   CLINICAL DATA:  Scrotal cellulitis  EXAM: ULTRASOUND OF SCROTUM  TECHNIQUE: Complete ultrasound examination of the testicles, epididymis, and other scrotal structures was performed. Doppler imaging was not ordered and therefore was not performed.  COMPARISON:  None  FINDINGS: Right testicle  Measurements: 4.2 x 2.1 x 2.4 cm. Normal morphology without mass. 2 mm calcification within RIGHT testis. Internal blood flow present within RIGHT testis.  Left testicle  Measurements: 4.2 x 3.0 x 3.7 cm. Diffusely heterogeneous echogenicity. Hypervascularity on color Doppler imaging. Findings most consistent with diffuse orchitis. No  focal mass or calcification.  Right epididymis:  Normal in size and appearance.  Left epididymis: Hypervascular on color Doppler imaging without focal mass.  Hydrocele: Complicated LEFT peritesticular fluid collection with numerous septations and loculations likely representing a pyocele.  Varicocele:  None identified  Thickened wall of LEFT hemiscrotum with hypervascularity and scattered soft tissue edema consistent with cellulitis.  IMPRESSION: No acute RIGHT testicular abnormalities.  Heterogeneous hypervascular LEFT testis and epididymis compatible with epididymal orchitis.  LEFT scrotal cellulitis.  Markedly complex LEFT scrotal fluid collection likely representing a pyocele.   Electronically Signed   By: Lavonia Dana M.D.   On: 08/04/2014 14:09   Dg Chest Port 1 View  08/05/2014  CLINICAL DATA:  Shortness of Breath  EXAM: PORTABLE CHEST - 1 VIEW  COMPARISON:  08/03/2014  FINDINGS: Left-sided pleural effusion is again identified. A drainage catheter is noted in place. No pneumothorax is noted. Right lung is clear. The cardiac shadow is stable. No acute bony abnormality is seen.  IMPRESSION: Stable left pleural effusion with drainage catheter in place.   Electronically Signed   By: Inez Catalina M.D.   On: 08/05/2014 08:40    Scheduled Meds: . sodium chloride   Intravenous Once  . acetaminophen  650 mg Oral q morning - 10a  . amitriptyline  10 mg Oral QHS  . atorvastatin  20 mg Oral q1800  . feeding supplement (ENSURE ENLIVE)  237 mL Oral TID WC  . feeding supplement (PRO-STAT SUGAR FREE 64)  60 mL Oral TID WC  . folic acid  1 mg Oral Daily  . gentamicin  7 mg/kg Intravenous Once  . ipratropium-albuterol  3 mL Nebulization TID  . mirtazapine  7.5 mg Oral QHS  . nicotine  14 mg Transdermal Daily  . pantoprazole  40 mg Oral BID AC  . piperacillin-tazobactam (ZOSYN)  IV  3.375 g Intravenous 3 times per day  . polyethylene glycol  17 g Oral q morning - 10a  . sertraline  50 mg Oral q morning -  10a   Continuous Infusions: . sodium chloride 10 mL/hr at 08/05/14 1100    Time spent: 35 minutes  [facility] [email or web address], password [email or web address] 08/06/2014, 9:58 AM  LOS: 3 days

## 2014-08-06 NOTE — Progress Notes (Signed)
ANTIBIOTIC CONSULT NOTE  Pharmacy Consult for Gentamicin Indication: Pseudomonal orchitis  Allergies  Allergen Reactions  . Gabapentin Other (See Comments)    Dysfunction of equilibrium   . Ibuprofen Other (See Comments)    "It made me feel worse"  . Valacyclovir Other (See Comments)    constipation    Patient Measurements: Height: '5\' 9"'$  (175.3 cm) Weight: 146 lb 2.6 oz (66.3 kg) IBW/kg (Calculated) : 70.7  Vital Signs: Temp: 98.2 F (36.8 C) (06/26 2005) Temp Source: Oral (06/26 2005) BP: 96/43 mmHg (06/26 2005) Pulse Rate: 87 (06/26 2005) Intake/Output from previous day: 06/25 0701 - 06/26 0700 In: 823.2 [I.V.:488.2; Blood:335] Out: 200 [Urine:200] Intake/Output from this shift: Total I/O In: 248.2 [I.V.:248.2] Out: 200 [Urine:200]  Labs:  Recent Labs  08/04/14 0701 08/05/14 0352 08/05/14 0913 08/05/14 2150 08/06/14 0230  WBC 27.9* 29.4*  --  25.5* 24.0*  HGB 9.0* 7.0* 7.1* 7.3* 7.5*  PLT 319 404*  --  413* 444*  CREATININE 0.88 0.95  --   --  0.86   Estimated Creatinine Clearance: 68.5 mL/min (by C-G formula based on Cr of 0.86).  Recent Labs  08/06/14 1940  GENTRANDOM 6.4     Microbiology: Recent Results (from the past 720 hour(s))  MRSA PCR Screening     Status: None   Collection Time: 08/03/14 11:09 PM  Result Value Ref Range Status   MRSA by PCR NEGATIVE NEGATIVE Final    Comment:        The GeneXpert MRSA Assay (FDA approved for NASAL specimens only), is one component of a comprehensive MRSA colonization surveillance program. It is not intended to diagnose MRSA infection nor to guide or monitor treatment for MRSA infections.   Urine culture     Status: None   Collection Time: 08/04/14  5:04 AM  Result Value Ref Range Status   Specimen Description URINE, RANDOM  Final   Special Requests NONE  Final   Culture 70,000 COLONIES/ml PSEUDOMONAS AERUGINOSA  Final   Report Status 08/06/2014 FINAL  Final   Organism ID, Bacteria PSEUDOMONAS  AERUGINOSA  Final      Susceptibility   Pseudomonas aeruginosa - MIC*    CEFTAZIDIME 4 SENSITIVE Sensitive     CIPROFLOXACIN <=0.25 SENSITIVE Sensitive     GENTAMICIN <=1 SENSITIVE Sensitive     IMIPENEM 1 SENSITIVE Sensitive     PIP/TAZO 8 SENSITIVE Sensitive     CEFEPIME 2 SENSITIVE Sensitive     * 70,000 COLONIES/ml PSEUDOMONAS AERUGINOSA  Culture, blood (x 2)     Status: None (Preliminary result)   Collection Time: 08/04/14  6:45 AM  Result Value Ref Range Status   Specimen Description BLOOD LEFT HAND  Final   Special Requests BOTTLES DRAWN AEROBIC ONLY 8CC  Final   Culture NO GROWTH 2 DAYS  Final   Report Status PENDING  Incomplete  Culture, blood (x 2)     Status: None (Preliminary result)   Collection Time: 08/04/14  6:54 AM  Result Value Ref Range Status   Specimen Description BLOOD LEFT HAND  Final   Special Requests BOTTLES DRAWN AEROBIC ONLY 4CC  Final   Culture NO GROWTH 2 DAYS  Final   Report Status PENDING  Incomplete    Assessment: 74 y/o Male with pseudomonal orchitisfor gentamicin.  Level of 6.4 drawn 9.5 hours after dose conveys q36h dosing per [facility] Nomogram.   Plan:  Gentamicin 460 mg IV q36h  Renold Kozar, Bronson Curb 08/06/2014,10:55 PM

## 2014-08-07 DIAGNOSIS — D509 Iron deficiency anemia, unspecified: Secondary | ICD-10-CM

## 2014-08-07 DIAGNOSIS — N453 Epididymo-orchitis: Secondary | ICD-10-CM

## 2014-08-07 DIAGNOSIS — A047 Enterocolitis due to Clostridium difficile: Secondary | ICD-10-CM

## 2014-08-07 DIAGNOSIS — A0472 Enterocolitis due to Clostridium difficile, not specified as recurrent: Secondary | ICD-10-CM

## 2014-08-07 DIAGNOSIS — D638 Anemia in other chronic diseases classified elsewhere: Secondary | ICD-10-CM

## 2014-08-07 LAB — PREPARE RBC (CROSSMATCH)

## 2014-08-07 LAB — LEGIONELLA ANTIGEN, URINE

## 2014-08-07 LAB — CBC
HEMATOCRIT: 22.3 % — AB (ref 39.0–52.0)
HEMATOCRIT: 28.4 % — AB (ref 39.0–52.0)
HEMOGLOBIN: 7.4 g/dL — AB (ref 13.0–17.0)
HEMOGLOBIN: 9.7 g/dL — AB (ref 13.0–17.0)
MCH: 28.8 pg (ref 26.0–34.0)
MCH: 29.7 pg (ref 26.0–34.0)
MCHC: 33.2 g/dL (ref 30.0–36.0)
MCHC: 34.2 g/dL (ref 30.0–36.0)
MCV: 86.8 fL (ref 78.0–100.0)
MCV: 86.9 fL (ref 78.0–100.0)
Platelets: 456 10*3/uL — ABNORMAL HIGH (ref 150–400)
Platelets: 473 10*3/uL — ABNORMAL HIGH (ref 150–400)
RBC: 2.57 MIL/uL — ABNORMAL LOW (ref 4.22–5.81)
RBC: 3.27 MIL/uL — ABNORMAL LOW (ref 4.22–5.81)
RDW: 14.8 % (ref 11.5–15.5)
RDW: 14 % (ref 11.5–15.5)
WBC: 21.7 10*3/uL — ABNORMAL HIGH (ref 4.0–10.5)
WBC: 19.4 10*3/uL — ABNORMAL HIGH (ref 4.0–10.5)

## 2014-08-07 LAB — TYPE AND SCREEN
ABO/RH(D): O POS
Antibody Screen: NEGATIVE

## 2014-08-07 LAB — CLOSTRIDIUM DIFFICILE BY PCR: CDIFFPCR: POSITIVE — AB

## 2014-08-07 LAB — OCCULT BLOOD X 1 CARD TO LAB, STOOL: FECAL OCCULT BLD: POSITIVE — AB

## 2014-08-07 MED ORDER — POTASSIUM CHLORIDE CRYS ER 20 MEQ PO TBCR
40.0000 meq | EXTENDED_RELEASE_TABLET | Freq: Once | ORAL | Status: AC
  Administered 2014-08-07: 40 meq via ORAL
  Filled 2014-08-07: qty 2

## 2014-08-07 MED ORDER — PANTOPRAZOLE SODIUM 40 MG IV SOLR
40.0000 mg | Freq: Two times a day (BID) | INTRAVENOUS | Status: DC
  Administered 2014-08-07 – 2014-08-08 (×2): 40 mg via INTRAVENOUS
  Filled 2014-08-07 (×3): qty 40

## 2014-08-07 MED ORDER — FUROSEMIDE 10 MG/ML IJ SOLN
20.0000 mg | Freq: Once | INTRAMUSCULAR | Status: AC
  Administered 2014-08-07: 20 mg via INTRAVENOUS
  Filled 2014-08-07: qty 2

## 2014-08-07 MED ORDER — VANCOMYCIN 50 MG/ML ORAL SOLUTION
125.0000 mg | Freq: Four times a day (QID) | ORAL | Status: DC
  Administered 2014-08-07 – 2014-08-11 (×16): 125 mg via ORAL
  Filled 2014-08-07 (×20): qty 2.5

## 2014-08-07 MED ORDER — IPRATROPIUM-ALBUTEROL 0.5-2.5 (3) MG/3ML IN SOLN: 3.0000 mL | Freq: Four times a day (QID) | RESPIRATORY_TRACT | Status: DC | PRN

## 2014-08-07 MED ORDER — SODIUM CHLORIDE 0.9 % IV SOLN
Freq: Once | INTRAVENOUS | Status: AC
  Administered 2014-08-07: 10:00:00 via INTRAVENOUS

## 2014-08-07 MED ORDER — ONDANSETRON HCL 4 MG/2ML IJ SOLN
4.0000 mg | Freq: Four times a day (QID) | INTRAMUSCULAR | Status: DC | PRN
  Administered 2014-08-07 – 2014-08-10 (×6): 4 mg via INTRAVENOUS
  Filled 2014-08-07 (×7): qty 2

## 2014-08-07 NOTE — Progress Notes (Signed)
[facility] PROGRESS NOTE  OMAURI BOEVE GQQ:[id number] DOB: 1940/06/26 DOA: 08/03/2014 PCP: Penni Homans, MD  Assessment/Plan:  1-Epididymo-orchitis, acute with sepsis: D/W Dr. Tresa Moore. Urine cx growing pseudomonas. D/c vancomycin. Continue zosyn.  Penile implant uninfected. Blood cultures remain negative. Pseudomonas sensitive to zosyn. Will discontinue gentamycin if ok with Dr Tresa Moore.   2-Dyspnea: Chest x-ray without anything acute. Oxygen sat 100 %. Denies dyspnea.   3-  Malignant pleural effusion: drain pleurex q2 days  4- Acute encephalopathy secondary to anemia, infection: still confused. Baseline unclear.  5-  Recent CVA (cerebral vascular accident) ASA and plavix held for heme pos stool and anemia  6- Lung cancer on chemo, followed by Dr. Judie Petit, Acute GI bleed/  Multifactorial: chemo, heme pos stool, acute illness: S/P Transfuse 1 unit packed red blood cells 6-25.  Heme positive stool: Continue PPI. ASA plavix, naprosyn held Black stool this morning. Had mild abdominal pain this morning. Check for C diff.  Will transfuse 2 units PRBC. Change Protonix to IV.  Change diet to clear.  GI consulted.   9- Hypokalemia: check labs tomorrow. Will give another does of KCl.     Essential hypertension   COPD, stable   GERD   BPH (benign prostatic hyperplasia)   Spinal stenosis, lumbar region, with neurogenic claudication   Hereditary and idiopathic peripheral neuropathy   Carotid stenosis   Tobacco abuse   Protein-calorie malnutrition, severe:continue with remeron   Pressure ulcer, sacral per RN POA   Resume zoloft  Code Status:  full Family Communication:   Disposition Plan:  Back to SNF when more stable  Consultants:  urology  Procedures:   none  Antibiotics:  Vanc 6/24 - 6/26  zosyn 6/24 -  Gentamycin 6/26  HPI/Subjective: Just had BM, loose, black color. Had mild abdominal pain.   Objective: Filed Vitals:   08/07/14 0814  BP:  117/62  Pulse: 76  Temp:   Resp: 18    Intake/Output Summary (Last 24 hours) at 08/07/14 0833 Last data filed at 08/07/14 0800  Gross per 24 hour  Intake 816.84 ml  Output   1100 ml  Net -283.16 ml   Filed Weights   08/03/14 2300  Weight: 66.3 kg (146 lb 2.6 oz)    Exam:   General:  Alert in no Acute distress.   Cardiovascular: RRR without MGR  Respiratory: CTA without WRR  Abdomen: s, nt, nd  GU: no obvious cellulitis of scrotum.   Basic Metabolic Panel:  Recent Labs Lab 08/04/14 0701 08/05/14 0352 08/06/14 0230  NA 136 137 136  K 3.6 3.4* 3.4*  CL 103 106 106  CO2 22 19* 21*  GLUCOSE 122* 102* 108*  BUN 30* 23* 17  CREATININE 0.88 0.95 0.86  CALCIUM 8.2* 8.1* 8.0*  MG  --   --  1.7   Liver Function Tests: No results for input(s): AST, ALT, ALKPHOS, BILITOT, PROT, ALBUMIN in the last 168 hours. No results for input(s): LIPASE, AMYLASE in the last 168 hours. No results for input(s): AMMONIA in the last 168 hours. CBC:  Recent Labs Lab 08/04/14 0701 08/05/14 0352 08/05/14 0913 08/05/14 2150 08/06/14 0230 08/07/14 0230  WBC 27.9* 29.4*  --  25.5* 24.0* 21.7*  NEUTROABS  --   --   --   --  19.9*  --   HGB 9.0* 7.0* 7.1* 7.3* 7.5* 7.4*  HCT 26.9* 21.0* 21.4* 22.0* 22.9* 22.3*  MCV 85.7 87.1  --  85.9 86.4 86.8  PLT  319 404*  --  413* 444* 456*   Cardiac Enzymes: No results for input(s): CKTOTAL, CKMB, CKMBINDEX, TROPONINI in the last 168 hours. BNP (last 3 results)  Recent Labs  05/30/14 1630  BNP 25.2    ProBNP (last 3 results) No results for input(s): PROBNP in the last 8760 hours.  CBG: No results for input(s): GLUCAP in the last 168 hours.  Recent Results (from the past 240 hour(s))  MRSA PCR Screening     Status: None   Collection Time: 08/03/14 11:09 PM  Result Value Ref Range Status   MRSA by PCR NEGATIVE NEGATIVE Final    Comment:        The GeneXpert MRSA Assay (FDA approved for NASAL specimens only), is one component  of a comprehensive MRSA colonization surveillance program. It is not intended to diagnose MRSA infection nor to guide or monitor treatment for MRSA infections.   Urine culture     Status: None   Collection Time: 08/04/14  5:04 AM  Result Value Ref Range Status   Specimen Description URINE, RANDOM  Final   Special Requests NONE  Final   Culture 70,000 COLONIES/ml PSEUDOMONAS AERUGINOSA  Final   Report Status 08/06/2014 FINAL  Final   Organism ID, Bacteria PSEUDOMONAS AERUGINOSA  Final      Susceptibility   Pseudomonas aeruginosa - MIC*    CEFTAZIDIME 4 SENSITIVE Sensitive     CIPROFLOXACIN <=0.25 SENSITIVE Sensitive     GENTAMICIN <=1 SENSITIVE Sensitive     IMIPENEM 1 SENSITIVE Sensitive     PIP/TAZO 8 SENSITIVE Sensitive     CEFEPIME 2 SENSITIVE Sensitive     * 70,000 COLONIES/ml PSEUDOMONAS AERUGINOSA  Culture, blood (x 2)     Status: None (Preliminary result)   Collection Time: 08/04/14  6:45 AM  Result Value Ref Range Status   Specimen Description BLOOD LEFT HAND  Final   Special Requests BOTTLES DRAWN AEROBIC ONLY 8CC  Final   Culture NO GROWTH 2 DAYS  Final   Report Status PENDING  Incomplete  Culture, blood (x 2)     Status: None (Preliminary result)   Collection Time: 08/04/14  6:54 AM  Result Value Ref Range Status   Specimen Description BLOOD LEFT HAND  Final   Special Requests BOTTLES DRAWN AEROBIC ONLY 4CC  Final   Culture NO GROWTH 2 DAYS  Final   Report Status PENDING  Incomplete     Studies: Dg Chest Port 1 View  08/05/2014   CLINICAL DATA:  Shortness of Breath  EXAM: PORTABLE CHEST - 1 VIEW  COMPARISON:  08/03/2014  FINDINGS: Left-sided pleural effusion is again identified. A drainage catheter is noted in place. No pneumothorax is noted. Right lung is clear. The cardiac shadow is stable. No acute bony abnormality is seen.  IMPRESSION: Stable left pleural effusion with drainage catheter in place.   Electronically Signed   By: Inez Catalina M.D.   On:  08/05/2014 08:40    Scheduled Meds: . sodium chloride   Intravenous Once  . acetaminophen  650 mg Oral q morning - 10a  . amitriptyline  10 mg Oral QHS  . atorvastatin  20 mg Oral q1800  . feeding supplement (ENSURE ENLIVE)  237 mL Oral TID WC  . feeding supplement (PRO-STAT SUGAR FREE 64)  60 mL Oral TID WC  . folic acid  1 mg Oral Daily  . gentamicin  460 mg Intravenous Q36H  . ipratropium-albuterol  3 mL Nebulization TID  . mirtazapine  7.5 mg Oral QHS  . nicotine  14 mg Transdermal Daily  . pantoprazole  40 mg Oral BID AC  . piperacillin-tazobactam (ZOSYN)  IV  3.375 g Intravenous 3 times per day  . polyethylene glycol  17 g Oral q morning - 10a  . sertraline  50 mg Oral q morning - 10a   Continuous Infusions: . sodium chloride 10 mL/hr at 08/06/14 1400    Time spent: 35 minutes  Jamarious Febo A  [facility] [email or web address], password [facility] 08/07/2014, 8:33 AM  LOS: 4 days

## 2014-08-07 NOTE — Progress Notes (Signed)
OT Cancellation Note  Patient Details Name: Ian Gonzales MRN: [id number] DOB: 1940/07/22   Cancelled Treatment:    Reason Eval/Treat Not Completed: Pt receiving peri care with nursing.  Will reattempt.   Darlina Rumpf [place], OTR/L [phone]  08/07/2014, 12:37 PM

## 2014-08-07 NOTE — Evaluation (Signed)
Occupational Therapy Evaluation Patient Details Name: Ian Gonzales MRN: [id number] DOB: [birth year] Today's Date: 08/07/2014    History of Present Illness LIONARDO HAZE is a 74 y.o. male with PMH of hypertension, hyperlipidemia, chronic back pain due to lumbar spinal stenosis, GERD, depression, metastatic adenocarcinoma of unknown primary questionable for lung cancer versus gastrointestinal diagnosed in May 2016, recently diagnosed stage IV lung cancer, stroke, COPD, who presents with altered mental status, shortness of breath, bloody stool, scrotal swelling and pain.  CT abdomen/pelvis with contrast showed stable loculated left pleural effusion with associated drainage catheter, mild hydrocele is noted in the left side of scrotum. 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   Clinical Impression   Pt admitted with above. He demonstrates the below listed deficits and will benefit from continued OT to maximize safety and independence with BADLs.  Pt presents with significant deconditioning/generalized weakness.  He requires mod - max A for ADLs.  Recommend SNF.       Follow Up Recommendations  SNF    Equipment Recommendations  None recommended by OT    Recommendations for Other Services       Precautions / Restrictions Precautions Precautions: Fall      Mobility Bed Mobility Overal bed mobility: Needs Assistance Bed Mobility: Supine to Sit;Sit to Supine     Supine to sit: Min assist Sit to supine: Min assist   General bed mobility comments: assist to move LEs onto and off of the bed   Transfers Overall transfer level: Needs assistance Equipment used: 1 person hand held assist Transfers: Sit to/from Stand Sit to Stand: Min assist         General transfer comment: assist for balance     Balance Overall balance assessment: Needs assistance Sitting-balance support: Feet supported Sitting balance-Leahy Scale: Fair     Standing balance  support: Single extremity supported Standing balance-Leahy Scale: Poor                              ADL Overall ADL's : Needs assistance/impaired Eating/Feeding: Minimal assistance;Sitting Eating/Feeding Details (indicate cue type and reason): Pt sat EOB for lunch.  Min difficulty manipulating spoon.  RN reported that pt would only briefly hold a cup before handing it to someone else to hold.  Pt provided with lidded mug and was able to manipulate that with set up supervision  Grooming: Wash/dry hands;Minimal assistance;Bed level Grooming Details (indicate cue type and reason): assist for thoroughness  Upper Body Bathing: Maximal assistance;Bed level   Lower Body Bathing: Total assistance;Bed level   Upper Body Dressing : Total assistance;Bed level   Lower Body Dressing: Total assistance;Bed level   Toilet Transfer: Moderate assistance;Stand-pivot   Toileting- Clothing Manipulation and Hygiene: Total assistance;Bed level Toileting - Clothing Manipulation Details (indicate cue type and reason): Pt incontinent of stool.  Assisted with peri care     Functional mobility during ADLs: Moderate assistance General ADL Comments: Pt fatigues quickly and was incontinent of stool which limited participation      Vision     Perception     Praxis      Pertinent Vitals/Pain Pain Assessment: Faces Faces Pain Scale: Hurts little more Pain Location: buttocks and abdomen  Pain Descriptors / Indicators: Grimacing Pain Intervention(s): Limited activity within patient's tolerance;Monitored during session;Repositioned     Hand Dominance Left   Extremity/Trunk Assessment Upper Extremity Assessment Upper Extremity Assessment: Generalized weakness   Lower Extremity  Assessment Lower Extremity Assessment: Defer to PT evaluation   Cervical / Trunk Assessment Cervical / Trunk Assessment: Kyphotic   Communication Communication Communication: HOH   Cognition Arousal/Alertness:  Awake/alert Behavior During Therapy: Restless Overall Cognitive Status: No family/caregiver present to determine baseline cognitive functioning (Pt with h/o dementia )                     General Comments       Exercises       Shoulder Instructions      Home Living Family/patient expects to be discharged to:: Skilled nursing facility Living Arrangements: Other (Comment) (SNF)                                      Prior Functioning/Environment          Comments: Pt unable to provide info re: PLOF due to cognitive dysfunction     OT Diagnosis: Generalized weakness;Cognitive deficits;Acute pain   OT Problem List: Decreased strength;Decreased activity tolerance;Impaired balance (sitting and/or standing);Decreased cognition;Decreased safety awareness;Pain;Decreased knowledge of use of DME or AE   OT Treatment/Interventions: Self-care/ADL training;DME and/or AE instruction;Therapeutic activities;Patient/family education;Balance training    OT Goals(Current goals can be found in the care plan section) Acute Rehab OT Goals OT Goal Formulation: Patient unable to participate in goal setting Time For Goal Achievement: 08/21/14 Potential to Achieve Goals: Fair ADL Goals Pt Will Perform Eating: with set-up;sitting Pt Will Perform Grooming: with supervision;sitting Pt Will Perform Upper Body Bathing: with min assist;sitting Pt Will Transfer to Toilet: with min assist;ambulating;regular height toilet;bedside commode;grab bars Pt Will Perform Toileting - Clothing Manipulation and hygiene: with min assist;sit to/from stand  OT Frequency: Min 2X/week   Barriers to D/C: Decreased caregiver support          Co-evaluation              End of Session Nurse Communication: Mobility status  Activity Tolerance: Patient limited by fatigue Patient left: in bed;with call bell/phone within reach;with nursing/sitter in room   Time: 1339-1411 OT Time Calculation  (min): 32 min Charges:  OT General Charges $OT Visit: 1 Procedure OT Evaluation $Initial OT Evaluation Tier I: 1 Procedure OT Treatments $Self Care/Home Management : 8-22 mins G-Codes:    Azhia Siefken M Aug 27, 2014, 3:38 PM

## 2014-08-07 NOTE — Clinical Social Work Note (Signed)
Clinical Social Work Assessment  Patient Details  Name: Ian Gonzales MRN: [id number] Date of Birth: 1940-05-19  Date of referral:  08/07/14               Reason for consult:  Facility Placement                Housing/Transportation Living arrangements for the past 2 months:  [place] of Information:  Patient Patient Interpreter Needed:  None Criminal Activity/Legal Involvement Pertinent to Current Situation/Hospitalization:  No - Comment as needed Significant Relationships:  Significant Other, Friend Lives with:  Self Do you feel safe going back to the place where you live?  No Need for family participation in patient care:  No (Coment)  Care giving concerns:  N/A   Facilities manager / plan:  CSW met the pt at bedside. CSW introduced self and purpose of the visit. CSW discussed SNF rehab. Pt reported that he was receiving rehab at [place], but does not wishes to return. Pt reported that he would like to go to [facility]. CSW explained the SNF process. CSW explained insurance and its relation to SNF placement. CSW answered all questions in which the pt inquired about. CSW will continue to follow this pt and assist with discharge as needed.   Employment status:  Retired Forensic scientist:  [employer] PT Recommendations:  [place] / Referral to community resources:  [place]  Patient/Family's Response to care:  The pt stated that he can not complain about the care he has received because they staff has been good to him.   Patient/Family's Understanding of and Emotional Response to Diagnosis, Current Treatment, and Prognosis:  The pt acknowledged a decline in his health. Pt reported that he just want to be able to go home and spend time with his girlfriend.    Emotional Assessment Appearance:  Appears stated age Attitude/Demeanor/Rapport:   (Calm ) Affect (typically observed):  Accepting,  Pleasant Orientation:  Oriented to Self, Oriented to Place, Oriented to  Time, Oriented to Situation Alcohol / Substance use:  Not Applicable Psych involvement (Current and /or in the community):  No (Comment)  Discharge Needs  Concerns to be addressed:  Denies Needs/Concerns at this time Readmission within the last 30 days:  No Current discharge risk:  None Barriers to Discharge:  No Barriers Identified   Sabatino Williard, LCSW 08/07/2014, 2:06 PM

## 2014-08-07 NOTE — Clinical Social Work Placement (Signed)
   CLINICAL SOCIAL WORK PLACEMENT  NOTE  Date:  08/07/2014  Patient Details  Name: Ian Gonzales MRN: [id number] Date of Birth: [birth year]  Clinical Social Work is seeking post-discharge placement for this patient at the [place] level of care (*CSW will initial, date and re-position this form in  chart as items are completed):  Yes   Patient/family provided with [facility] Work Department's list of facilities offering this level of care within the geographic area requested by the patient (or if unable, by the patient's family).  Yes   Patient/family informed of their freedom to choose among providers that offer the needed level of care, that participate in Medicare, Medicaid or managed care program needed by the patient, have an available bed and are willing to accept the patient.  Yes   Patient/family informed of [facility]'s ownership interest in [facility] and [facility], as well as of the fact that they are under no obligation to receive care at these facilities.  PASRR submitted to EDS on       PASRR number received on       Existing PASRR number confirmed on 08/07/14     FL2 transmitted to all facilities in geographic area requested by pt/family on 08/07/14     FL2 transmitted to all facilities within larger geographic area on       Patient informed that his/her managed care company has contracts with or will negotiate with certain facilities, including the following:            Patient/family informed of bed offers received.  Patient chooses bed at       Physician recommends and patient chooses bed at      Patient to be transferred to   on  .  Patient to be transferred to facility by       Patient family notified on   of transfer.  Name of family member notified:        PHYSICIAN       Additional Comment:    _______________________________________________ Greta Doom, LCSW 08/07/2014, 3:39 PM

## 2014-08-07 NOTE — Consult Note (Signed)
Consultation  Referring Provider:   [facility]   Primary Care Physician:  Penni Homans, MD Primary Gastroenterologist:  Oretha Caprice, MD     Reason for Consultation:   GI bleed            HPI:   Ian Gonzales is a 74 y.o. male recently diagnosed with metastatic lung cancer on chemo. He had a stroke early May. Patient has a history of adenomatous colon polyps, last surveillance colonoscopy was August 2014 - a few small tubular adenomas.   Patient transferred from [facility] 6/24 with altered mental status, bloody stool / epididymo-orchitis with sepsis. He had a recent CVA and is on plavix and asa. Found to be anemic this admission with presenting hgb of 9.0, down from 10.2 two weeks ago. Hgb declined further to 7.1,  patient given a unit of blood 6/25 with minimal rise in hgb. He is getting another two units today.. BUN elevated but typical for patient.   Per RN patient passed a dark stool this am. Stools are loose, c-diff is pending. Patient doesn't know if he has been having dark stools. He is "too worn out" to given any history. I asked him about abdominal pain he began telling me something about pain after eating eggs but then felt to tired to continue conversation.    Past Medical History  Diagnosis Date  . Hypertension   . GERD (gastroesophageal reflux disease)   . Colon polyps   . Back pain, lumbosacral   . Sinusitis acute 03/24/2011  . Memory loss 04/16/2011  . Allergic state 05/30/2011  . Leukocytosis 12/15/2011  . Hyperlipidemia 02/15/2012  . Unspecified sinusitis (chronic) 04/05/2012  . Epicondylitis 10/10/2012  . Shortness of breath     WITH EXERTION AND BACK PAIN  . Wound drainage     L ELBOW  . Spinal stenosis, lumbar   . Lung cancer     Past Surgical History  Procedure Laterality Date  . Lumbar laminectomy  1974    twice  . Urethral stricture dilatation    . Colonoscopy    . Penile prosthesis implant    . Cataracts      REMOVED  .  Decompressive lumbar laminectomy level 2 N/A 11/26/2012    Procedure: CENTRAL DECOMPRESSION LUMBAR LAMINECTOMY L3-L4,  L4-L5   ;  Surgeon: Tobi Bastos, MD;  Location: [facility];  Service: Orthopedics;  Laterality: N/A;  . Chest tube insertion Left 05/31/2014    Procedure: INSERTION OF LEFT PLEURAL DRAINAGE CATHETER;  Surgeon: Grace Isaac, MD;  Location: [facility];  Service: Thoracic;  Laterality: Left;  . Pleural effusion drainage Left 05/31/2014    Procedure: DRAINAGE OF LEFT PLEURAL EFFUSION;  Surgeon: Grace Isaac, MD;  Location: [facility];  Service: Thoracic;  Laterality: Left;  . Esophagogastroduodenoscopy (egd) with propofol N/A 06/29/2014    Procedure: ESOPHAGOGASTRODUODENOSCOPY (EGD) WITH PROPOFOL;  Surgeon: Milus Banister, MD;  Location: [facility];  Service: Endoscopy;  Laterality: N/A;    No family history on file.   History  Substance Use Topics  . Smoking status: Current Every Day Smoker -- 0.25 packs/day for 63 years    Types: Cigarettes  . Smokeless tobacco: Former Systems developer    Types: Chew     Comment: 3-5 cigs daily  . Alcohol Use: 0.0 oz/week    0 Standard drinks or equivalent per week     Comment: once in a while     Prior to Admission  medications   Medication Sig Start Date End Date Taking? Authorizing Provider  acetaminophen (TYLENOL) 325 MG tablet Take 650 mg by mouth every morning.   Yes Historical Provider, MD  Alum & Mag Hydroxide-Simeth (MAGIC MOUTHWASH) SOLN Take 5 mLs by mouth 4 (four) times daily as needed for mouth pain. Swish and spit Patient taking differently: Take 5 mLs by mouth 4 (four) times daily. Swish and spit 07/24/14  Yes Adrena E Johnson, PA-C  Amino Acids-Protein Hydrolys (FEEDING SUPPLEMENT, PRO-STAT SUGAR FREE 64,) LIQD Take 60 mLs by mouth 3 (three) times daily with meals.   Yes Historical Provider, MD  amitriptyline (ELAVIL) 10 MG tablet Take 10 mg by mouth at bedtime.   Yes Historical Provider, MD  amLODipine (NORVASC) 5 MG tablet Take 5 mg  by mouth daily.  06/01/14  Yes Historical Provider, MD  aspirin 81 MG tablet Take 1 tablet (81 mg total) by mouth daily. Hold until 06/01/14; due to pleurex catheter placement; then resume therapy as previously indictaed 05/31/14  Yes Barton Dubois, MD  atorvastatin (LIPITOR) 20 MG tablet Take 1 tablet (20 mg total) by mouth daily at 6 PM. 06/17/14  Yes Allie Bossier, MD  baclofen (LIORESAL) 5 mg TABS tablet Take 5 mg by mouth 3 (three) times daily.   Yes Historical Provider, MD  bisacodyl (DULCOLAX) 5 MG EC tablet Take 10 mg by mouth daily as needed for moderate constipation.   Yes Historical Provider, MD  clopidogrel (PLAVIX) 75 MG tablet Take 1 tablet (75 mg total) by mouth daily. 06/17/14  Yes Allie Bossier, MD  dexamethasone (DECADRON) 4 MG tablet Take 1 tablet (4 mg total) by mouth 2 (two) times daily with a meal. The day before, the day of and the day after chemotherapy 07/06/14  Yes Adrena E Johnson, PA-C  fluticasone (FLONASE) 50 MCG/ACT nasal spray Place 1 spray into both nostrils daily.   Yes Historical Provider, MD  folic acid (FOLVITE) 1 MG tablet Take 1 tablet (1 mg total) by mouth daily. 07/06/14  Yes Adrena E Johnson, PA-C  loratadine (CLARITIN) 10 MG tablet Take 10 mg by mouth daily.   Yes Historical Provider, MD  mirtazapine (REMERON) 7.5 MG tablet Take 7.5 mg by mouth at bedtime.   Yes Historical Provider, MD  naproxen sodium (ANAPROX) 220 MG tablet Take 220 mg by mouth every evening.    Yes Historical Provider, MD  nitroGLYCERIN (NITROSTAT) 0.4 MG SL tablet Place 0.4 mg under the tongue every 5 (five) minutes as needed for chest pain.   Yes Historical Provider, MD  OVER THE COUNTER MEDICATION Take 80 mLs by mouth 4 (four) times daily. Med pass   Yes Historical Provider, MD  OVER THE COUNTER MEDICATION Take 1 Container by mouth 3 (three) times daily with meals. Magic cup   Yes Historical Provider, MD  oxyCODONE (OXY IR/ROXICODONE) 5 MG immediate release tablet Take 1 tablet (5 mg total) by  mouth every 4 (four) hours as needed for severe pain. Patient taking differently: Take 5 mg by mouth every 4 (four) hours as needed for severe pain (prior to pleurex drain).  06/17/14  Yes Allie Bossier, MD  polyethylene glycol [facility] / Floria Raveling) packet Take 17 g by mouth every morning.   Yes Historical Provider, MD  potassium chloride SA (K-DUR,KLOR-CON) 20 MEQ tablet Take 40 mEq by mouth once.   Yes Historical Provider, MD  prochlorperazine (COMPAZINE) 10 MG tablet Take 1 tablet (10 mg total) by mouth every 6 (six) hours  as needed for nausea or vomiting. 07/06/14  Yes Adrena E Johnson, PA-C  ranitidine (ZANTAC) 300 MG tablet take 1 tablet by mouth at bedtime 03/15/14  Yes Mosie Lukes, MD  sertraline (ZOLOFT) 50 MG tablet Take 50 mg by mouth every morning.   Yes Historical Provider, MD  sodium chloride (OCEAN) 0.65 % SOLN nasal spray Place 1 spray into both nostrils as needed for congestion.   Yes Historical Provider, MD  traMADol (ULTRAM) 50 MG tablet Take 1 tablet (50 mg total) by mouth every 8 (eight) hours as needed for severe pain. 05/31/14  Yes Barton Dubois, MD  Water For Irrigation, Sterile (FREE WATER) SOLN Place 150 mLs into feeding tube 4 (four) times daily.   Yes Historical Provider, MD  levofloxacin (LEVAQUIN) 500 MG tablet Take 500 mg by mouth daily. Started 08/04/14, for 10 days ending 08/12/14    Historical Provider, MD    Current Facility-Administered Medications  Medication Dose Route Frequency Provider Last Rate Last Dose  . 0.9 %  sodium chloride infusion   Intravenous Continuous Delfina Redwood, MD 10 mL/hr at 08/07/14 0900    . 0.9 %  sodium chloride infusion   Intravenous Once Delfina Redwood, MD      . acetaminophen (TYLENOL) tablet 650 mg  650 mg Oral q morning - 10a Ivor Costa, MD   650 mg at 08/07/14 1017  . amitriptyline (ELAVIL) tablet 10 mg  10 mg Oral QHS Ivor Costa, MD   10 mg at 08/06/14 2141  . atorvastatin (LIPITOR) tablet 20 mg  20 mg Oral q1800 Ivor Costa, MD    20 mg at 08/06/14 1639  . baclofen (LIORESAL) tablet 5 mg  5 mg Oral QID PRN Delfina Redwood, MD      . bisacodyl (DULCOLAX) EC tablet 10 mg  10 mg Oral Daily PRN Delfina Redwood, MD      . feeding supplement (ENSURE ENLIVE) (ENSURE ENLIVE) liquid 237 mL  237 mL Oral TID WC Ardeen Garland, RD   237 mL at 08/06/14 1639  . feeding supplement (PRO-STAT SUGAR FREE 64) liquid 60 mL  60 mL Oral TID WC Delfina Redwood, MD   60 mL at 08/05/14 1136  . folic acid (FOLVITE) tablet 1 mg  1 mg Oral Daily Ivor Costa, MD   1 mg at 08/07/14 1017  . gentamicin (GARAMYCIN) 460 mg in dextrose 5 % 100 mL IVPB  460 mg Intravenous Q36H Delfina Redwood, MD      . ipratropium-albuterol (DUONEB) 0.5-2.5 (3) MG/3ML nebulizer solution 3 mL  3 mL Nebulization Q6H PRN Delfina Redwood, MD      . LORazepam (ATIVAN) tablet 0.5 mg  0.5 mg Oral Q6H PRN Delfina Redwood, MD      . mirtazapine (REMERON) tablet 7.5 mg  7.5 mg Oral QHS Delfina Redwood, MD   7.5 mg at 08/06/14 2141  . nicotine (NICODERM CQ - dosed in mg/24 hours) patch 14 mg  14 mg Transdermal Daily Ivor Costa, MD   14 mg at 08/07/14 1018  . oxyCODONE (Oxy IR/ROXICODONE) immediate release tablet 5 mg  5 mg Oral Q4H PRN Ivor Costa, MD   5 mg at 08/07/14 0804  . pantoprazole (PROTONIX) injection 40 mg  40 mg Intravenous Q12H Belkys A Regalado, MD      . piperacillin-tazobactam (ZOSYN) IVPB 3.375 g  3.375 g Intravenous 3 times per day Erenest Blank, RPH   3.375 g at 08/07/14  3005  . sertraline (ZOLOFT) tablet 50 mg  50 mg Oral q morning - 10a Delfina Redwood, MD   50 mg at 08/07/14 1017  . sodium chloride (OCEAN) 0.65 % nasal spray 1 spray  1 spray Each Nare PRN Delfina Redwood, MD        Allergies as of 08/03/2014 - Review Complete 07/24/2014  Allergen Reaction Noted  . Ibuprofen Other (See Comments) 05/26/2014  . Gabapentin  05/24/2013  . Valacyclovir Other (See Comments) 05/04/2014   Review of Systems:    As per HPI, otherwise  negative    Physical Exam:  Vital signs in last 24 hours: Temp:  [97.4 F (36.3 C)-99.2 F (37.3 C)] 97.4 F (36.3 C) (06/27 1030) Pulse Rate:  [76-97] 81 (06/27 1035) Resp:  [15-26] 19 (06/27 1035) BP: (96-135)/(43-113) 122/62 mmHg (06/27 1035) SpO2:  [96 %-100 %] 100 % (06/27 1035) Last BM Date: 08/03/14 General:   White male in NAD Head:  Normocephalic and atraumatic. Eyes:   No icterus.   Conjunctiva pink. Ears:  Normal auditory acuity. Neck:  Supple Heart:  Regular rate and rhythm Abdomen:  Soft, nondistended, nontender. Normal bowel sounds. No appreciable masses or hepatomegaly.  Rectal:  Liquid dark brown stool in vaults.   Msk:  Symmetrical without gross deformities.  Extremities:  Without edema. Neurologic:  Alert and  oriented x4;  grossly normal neurologically. Skin:  Intact without significant lesions or rashes. Psych:  Alert and cooperative. Marland Kitchen  LAB RESULTS:  Recent Labs  08/05/14 2150 08/06/14 0230 08/07/14 0230  WBC 25.5* 24.0* 21.7*  HGB 7.3* 7.5* 7.4*  HCT 22.0* 22.9* 22.3*  PLT 413* 444* 456*   BMET  Recent Labs  08/05/14 0352 08/06/14 0230  NA 137 136  K 3.4* 3.4*  CL 106 106  CO2 19* 21*  GLUCOSE 102* 108*  BUN 23* 17  CREATININE 0.95 0.86  CALCIUM 8.1* 8.0*    PREVIOUS ENDOSCOPIES:            Colonoscopy 2014 - see HPI EGD May 2016 to evaluate for GI malignancy after lung biopsy showed adenocarcinoma. Only mild,non-specific gastritis found   Impression / Plan:   74 year old male with acute on chronic anemia. Stools liquid, dark but not black on exam today. Patient has been on plavix and asa but at present not sure that anemia is secondary to GI bleed. He is on chemo, perhaps that is contributing to his decline in hgb. Patient had a colonoscopy August 2014 and had an unremarkable EGD in May of this year (to rule out GI malignancy after lung biopsy revealed adenocarcinoma).   Will follow along, EGD or further GI workup will depend on  whether there is overt GI bleeding.  Continue BID PPI   C-diff pending (loose stool)  Thanks   LOS: 4 days   Tye Savoy  08/07/2014, 10:48 AM

## 2014-08-07 NOTE — Care Management Note (Addendum)
Case Management Note  Patient Details  Name: DEQUAN KINDRED MRN: [id number] Date of Birth: October 31, 1940 Subjective/Objective:             From [place]/rehab . center , admitted with  altered mental status, shortness of breath, bloody stool, and scrotal swelling and pain. Hx of  malignant pleural  effusion  with pleurex drain.   Action/Plan: Return to snf/rehab. when medically stable. CM to f/u with d/c needs.   Expected Discharge Date:                  Expected Discharge Plan:  [place] (SNF/REHAB)  In-House Referral:  Clinical Social Work (csw, aware)  Discharge planning Services  CM Consult  Post Acute Care Choice:    Choice offered to:     DME Arranged:    DME Agency:     HH Arranged:    [place] Agency:     Status of Service:  In process, will continue to follow  Medicare Important Message Given:    Date Medicare IM Given:    Medicare IM give by:    Date Additional Medicare IM Given:    Additional Medicare Important Message give by:     If discussed at [place] of Stay Meetings, dates discussed:    Additional Comments: Cydney Ok (Friend) [phone] , Elicia Lamp (Friend)  [phone]   Whitman Hero Ilion, RN,BSN,CM 08/07/2014, 11:08 AM [phone]

## 2014-08-08 LAB — BASIC METABOLIC PANEL
Anion gap: 7 (ref 5–15)
BUN: 13 mg/dL (ref 6–20)
CALCIUM: 8.1 mg/dL — AB (ref 8.9–10.3)
CO2: 23 mmol/L (ref 22–32)
CREATININE: 0.87 mg/dL (ref 0.61–1.24)
Chloride: 104 mmol/L (ref 101–111)
GFR calc Af Amer: >60 mL/min (ref >60)
GLUCOSE: 89 mg/dL (ref 65–99)
POTASSIUM: 3.8 mmol/L (ref 3.5–5.1)
Sodium: 134 mmol/L — ABNORMAL LOW (ref 135–145)

## 2014-08-08 LAB — TYPE AND SCREEN
ABO/RH(D): O POS
ANTIBODY SCREEN: NEGATIVE
UNIT DIVISION: 0
Unit division: 0
Unit division: 0

## 2014-08-08 LAB — CBC
HCT: 30.2 % — ABNORMAL LOW (ref 39.0–52.0)
Hemoglobin: 10.2 g/dL — ABNORMAL LOW (ref 13.0–17.0)
MCH: 29.1 pg (ref 26.0–34.0)
MCHC: 33.8 g/dL (ref 30.0–36.0)
MCV: 86 fL (ref 78.0–100.0)
PLATELETS: 537 10*3/uL — AB (ref 150–400)
RBC: 3.51 MIL/uL — ABNORMAL LOW (ref 4.22–5.81)
RDW: 14.5 % (ref 11.5–15.5)
WBC: 17.7 10*3/uL — ABNORMAL HIGH (ref 4.0–10.5)

## 2014-08-08 LAB — PATHOLOGIST SMEAR REVIEW

## 2014-08-08 MED ORDER — CIPROFLOXACIN IN D5W 400 MG/200ML IV SOLN
400.0000 mg | Freq: Two times a day (BID) | INTRAVENOUS | Status: DC
  Administered 2014-08-08 – 2014-08-11 (×7): 400 mg via INTRAVENOUS
  Filled 2014-08-08 (×10): qty 200

## 2014-08-08 MED ORDER — CLOPIDOGREL BISULFATE 75 MG PO TABS
75.0000 mg | ORAL_TABLET | Freq: Every day | ORAL | Status: DC
  Administered 2014-08-08 – 2014-08-11 (×4): 75 mg via ORAL
  Filled 2014-08-08 (×4): qty 1

## 2014-08-08 NOTE — Progress Notes (Signed)
Subjective:  1 - Left Epidiymorchitis - pt admitted from facility with few days metnal status chagnes and some left scrotal discomfort found to have likely left epidiymorchitis by CT and [place]. Leukocytosis to 27K. UCX, BCX obtainend and with some psudomonas sens cipro, gent, others.  On empiric Vanc + Zosyn on admission, transitioned to [place] 6/26 for maximal psudomonas coverage. 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  Imaging this admission with impressive left testicular and epidymal hyperemia and complex / reactive hydrocele. This fortunately appears away from IPP resevoir (rt pre pubic space), pump (rt hemicrotum), and cylinders.  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   PMH sig for metastatic adenocarcinoma (recieving chemo recently),COPD, Plurex catheter for chronic effusion, GI Bleed, severe dementia (lives in facility),. His POA is virgina Hall Busing (friend) at [phone]  Today [place] is stable. WBC count now decreasing. Less scrotal pain.   Objective: Vital signs in last 24 hours: Temp:  [97.2 F (36.2 C)-99 F (37.2 C)] 97.6 F (36.4 C) (06/28 0339) Pulse Rate:  [76-110] 82 (06/28 0339) Resp:  [13-29] 26 (06/28 0339) BP: (89-129)/(53-71) 107/59 mmHg (06/28 0339) SpO2:  [96 %-100 %] 100 % (06/28 0413) Last BM Date: 08/07/14  Intake/Output from previous day: 06/27 0701 - 06/28 0700 In: 1501.5 [P.O.:360; I.V.:240; Blood:690; IV Piggyback:211.5] Out: 925 [Urine:925] Intake/Output this shift: Total I/O In: 221.5 [I.V.:60; IV Piggyback:161.5] Out: 500 [Urine:500]  General appearance: alert and AOx1 as per recent baseline Nose: Nares normal. Septum midline. Mucosa normal. No drainage or sinus tenderness. Throat: lips, mucosa, and tongue normal; teeth and gums normal Neck: supple, symmetrical, trachea midline Back:  symmetric, no curvature. ROM normal. No CVA tenderness. Resp: non-labored GI: soft, non-tender; bowel sounds normal; no masses,  no organomegaly Male genitalia: somewhat improved left testicular swelling / inuration which remsain completely separate from all parts of IPP. No drainge or overlying skin erythema. condom canth in place. Extremities: extremities normal, atraumatic, no cyanosis or edema Lymph nodes: Cervical, supraclavicular, and axillary nodes normal.  Lab Results:   Recent Labs  08/07/14 0230 08/07/14 2102  WBC 21.7* 19.4*  HGB 7.4* 9.7*  HCT 22.3* 28.4*  PLT 456* 473*   BMET  Recent Labs  08/06/14 0230  NA 136  K 3.4*  CL 106  CO2 21*  GLUCOSE 108*  BUN 17  CREATININE 0.86  CALCIUM 8.0*   PT/INR No results for input(s): LABPROT, INR in the last 72 hours. ABG No results for input(s): PHART, HCO3 in the last 72 hours.  Invalid input(s): PCO2, PO2  Studies/Results: No results found.  Anti-infectives: Anti-infectives    Start     Dose/Rate Route Frequency Ordered Stop   08/07/14 2200  gentamicin (GARAMYCIN) 460 mg in dextrose 5 % 100 mL IVPB     460 mg 111.5 mL/hr over 60 Minutes Intravenous Every 36 hours 08/06/14 2257     08/07/14 1330  vancomycin (VANCOCIN) 50 mg/mL oral solution 125 mg     125 mg Oral 4 times daily 08/07/14 1253 08/21/14 1359   08/06/14 1000  gentamicin (GARAMYCIN) 460 mg in dextrose 5 % 100 mL IVPB     7 mg/kg  66.3 kg 111.5 mL/hr over 60 Minutes Intravenous  Once 08/06/14 0906 08/06/14 1120   08/04/14 1800  vancomycin (VANCOCIN) IVPB 750 mg/150 ml premix  Status:  Discontinued     750 mg 150 mL/hr over 60 Minutes Intravenous Every 12 hours 08/04/14 0955 08/06/14 0825   08/04/14 0345  vancomycin (VANCOCIN) IVPB 1000 mg/200 mL premix     1,000 mg 200 mL/hr over 60 Minutes Intravenous  Once 08/04/14 0330 08/04/14 0727   08/04/14 0345  piperacillin-tazobactam (ZOSYN) IVPB 3.375 g     3.375 g 12.5 mL/hr over 240 Minutes  Intravenous 3 times per day 08/04/14 0330        Assessment/Plan:  1 - Left Epidiymorchitis - serial exam and imaging suggests presenly clinically localized and w/o infarction. Agree with current ABX. Fortunately also sens to Cipro which would be good agent at discharge for continued therapy. As still with sig leukocytosis contiued inpatient management and IV ABX still seems most appropriate at this point.    Reinforced surgical indication with pt being progressive infection despite ABX or obvious infolvement of prosthesis and implications of surgery in his present disease state. Pt and his POA remain with very high threshold for surgery and I agree.   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 Will monitor for s/s infection with serial exams as per above.  3 - Will follow.   Kegan Mckeithan

## 2014-08-08 NOTE — Progress Notes (Signed)
Called report to [facility] on [facility]. Pt's VSS, all personal belongings with pt. CCMD and Elink notified, pt transferred on monitor to [id number].

## 2014-08-08 NOTE — Progress Notes (Signed)
Pleurx catheter drained using hospital protocol. Patient tolerated without event. Drained <5 cc's of serous colored affluent. Dressing placed, no complaints.

## 2014-08-08 NOTE — Progress Notes (Signed)
[facility] PROGRESS NOTE  Ian Gonzales ASN:[id number] DOB: 1940-12-29 DOA: 08/03/2014 PCP: Penni Homans, MD  Assessment/Plan:  1-Epididymo-orchitis, acute with sepsis: D/W Dr. Tresa Moore. Urine cx growing pseudomonas. D/c vancomycin. Continue zosyn.  Penile implant uninfected. Blood cultures remain negative. Pseudomonas sensitive to zosyn, ciprofloxacin. Will discontinue gentamycin. Change zosyn to cipro. Plan to discharge on oral ciprofloxacin. Discussed with Dr Tresa Moore.   2-Dyspnea: Chest x-ray without anything acute. Oxygen sat 100 %. Denies dyspnea.   3-C diff colitis; Started on vancomycin day 2. Diarrhea improved.   4- Acute encephalopathy secondary to anemia, infection: still confused. Baseline unclear.  5-  Recent CVA (cerebral vascular accident) resume Plavix.   6- Lung cancer on chemo, followed by Dr. Judie Petit, Acute GI bleed/  Multifactorial: chemo, heme pos stool, acute illness: S/P Transfuse 1 unit packed red blood cells 6-25.  Heme positive stool: Continue PPI. ASA plavix, naprosyn held Had black stool. C diff positive.  Hb stable. Discussed with Dr Deatra Ina ok to resume plavix.   9- Hypokalemia: B-met pending.  10-Malignant pleural effusion: drain pleurex q2 days   Essential hypertension   COPD, stable   GERD   BPH (benign prostatic hyperplasia)   Spinal stenosis, lumbar region, with neurogenic claudication   Hereditary and idiopathic peripheral neuropathy   Carotid stenosis   Tobacco abuse   Protein-calorie malnutrition, severe:continue with remeron   Pressure ulcer, sacral per RN POA   Resume zoloft  Code Status:  full Family Communication:  Care discussed with patient Disposition Plan:  Back to SNF when more stable  Consultants:  urology  Procedures:   none  Antibiotics:  Vanc 6/24 - 6/26  zosyn 6/24 -  Gentamycin 6/26  HPI/Subjective: No bm since last night. No more melena  Objective: Filed Vitals:   08/08/14 1134  BP:    Pulse:   Temp: 97.7 F (36.5 C)  Resp:     Intake/Output Summary (Last 24 hours) at 08/08/14 1405 Last data filed at 08/08/14 1134  Gross per 24 hour  Intake  681.5 ml  Output    900 ml  Net -218.5 ml   Filed Weights   08/03/14 2300  Weight: 66.3 kg (146 lb 2.6 oz)    Exam:   General:  Alert in no Acute distress.   Cardiovascular: RRR without MGR  Respiratory: CTA without WRR  Abdomen: s, nt, nd  GU: no obvious cellulitis of scrotum.   Basic Metabolic Panel:  Recent Labs Lab 08/04/14 0701 08/05/14 0352 08/06/14 0230  NA 136 137 136  K 3.6 3.4* 3.4*  CL 103 106 106  CO2 22 19* 21*  GLUCOSE 122* 102* 108*  BUN 30* 23* 17  CREATININE 0.88 0.95 0.86  CALCIUM 8.2* 8.1* 8.0*  MG  --   --  1.7   Liver Function Tests: No results for input(s): AST, ALT, ALKPHOS, BILITOT, PROT, ALBUMIN in the last 168 hours. No results for input(s): LIPASE, AMYLASE in the last 168 hours. No results for input(s): AMMONIA in the last 168 hours. CBC:  Recent Labs Lab 08/05/14 2150 08/06/14 0230 08/07/14 0230 08/07/14 2102 08/08/14 1335  WBC 25.5* 24.0* 21.7* 19.4* 17.7*  NEUTROABS  --  19.9*  --   --   --   HGB 7.3* 7.5* 7.4* 9.7* 10.2*  HCT 22.0* 22.9* 22.3* 28.4* 30.2*  MCV 85.9 86.4 86.8 86.9 86.0  PLT 413* 444* 456* 473* 537*   Cardiac Enzymes: No results for input(s): CKTOTAL, CKMB, CKMBINDEX, TROPONINI in the  last 168 hours. BNP (last 3 results)  Recent Labs  05/30/14 1630  BNP 25.2    ProBNP (last 3 results) No results for input(s): PROBNP in the last 8760 hours.  CBG: No results for input(s): GLUCAP in the last 168 hours.  Recent Results (from the past 240 hour(s))  MRSA PCR Screening     Status: None   Collection Time: 08/03/14 11:09 PM  Result Value Ref Range Status   MRSA by PCR NEGATIVE NEGATIVE Final    Comment:        The GeneXpert MRSA Assay (FDA approved for NASAL specimens only), is one component of a comprehensive MRSA  colonization surveillance program. It is not intended to diagnose MRSA infection nor to guide or monitor treatment for MRSA infections.   Urine culture     Status: None   Collection Time: 08/04/14  5:04 AM  Result Value Ref Range Status   Specimen Description URINE, RANDOM  Final   Special Requests NONE  Final   Culture 70,000 COLONIES/ml PSEUDOMONAS AERUGINOSA  Final   Report Status 08/06/2014 FINAL  Final   Organism ID, Bacteria PSEUDOMONAS AERUGINOSA  Final      Susceptibility   Pseudomonas aeruginosa - MIC*    CEFTAZIDIME 4 SENSITIVE Sensitive     CIPROFLOXACIN <=0.25 SENSITIVE Sensitive     GENTAMICIN <=1 SENSITIVE Sensitive     IMIPENEM 1 SENSITIVE Sensitive     PIP/TAZO 8 SENSITIVE Sensitive     CEFEPIME 2 SENSITIVE Sensitive     * 70,000 COLONIES/ml PSEUDOMONAS AERUGINOSA  Culture, blood (x 2)     Status: None (Preliminary result)   Collection Time: 08/04/14  6:45 AM  Result Value Ref Range Status   Specimen Description BLOOD LEFT HAND  Final   Special Requests BOTTLES DRAWN AEROBIC ONLY 8CC  Final   Culture NO GROWTH 4 DAYS  Final   Report Status PENDING  Incomplete  Culture, blood (x 2)     Status: None (Preliminary result)   Collection Time: 08/04/14  6:54 AM  Result Value Ref Range Status   Specimen Description BLOOD LEFT HAND  Final   Special Requests BOTTLES DRAWN AEROBIC ONLY 4CC  Final   Culture NO GROWTH 4 DAYS  Final   Report Status PENDING  Incomplete  Clostridium Difficile by PCR (not at [facility])     Status: Abnormal   Collection Time: 08/07/14  8:55 AM  Result Value Ref Range Status   C difficile by pcr POSITIVE (A) NEGATIVE Final    Comment: CRITICAL RESULT CALLED TO, READ BACK BY AND VERIFIED WITH: A. [facility] RN 11:35 08/07/14 ([email or web address])      Studies: No results found.  Scheduled Meds: . sodium chloride   Intravenous Once  . acetaminophen  650 mg Oral q morning - 10a  . amitriptyline  10 mg Oral QHS  . atorvastatin  20 mg Oral q1800  . feeding  supplement (ENSURE ENLIVE)  237 mL Oral TID WC  . feeding supplement (PRO-STAT SUGAR FREE 64)  60 mL Oral TID WC  . folic acid  1 mg Oral Daily  . gentamicin  460 mg Intravenous Q36H  . mirtazapine  7.5 mg Oral QHS  . nicotine  14 mg Transdermal Daily  . pantoprazole (PROTONIX) IV  40 mg Intravenous Q12H  . piperacillin-tazobactam (ZOSYN)  IV  3.375 g Intravenous 3 times per day  . sertraline  50 mg Oral q morning - 10a  . vancomycin  125 mg Oral QID  Continuous Infusions: . sodium chloride 10 mL/hr at 08/07/14 1800    Time spent: 35 minutes  Regalado, Belkys A  [facility] [email or web address], password [facility] 08/08/2014, 2:05 PM  LOS: 5 days

## 2014-08-08 NOTE — Progress Notes (Signed)
Physical Therapy Treatment Patient Details Name: Ian Gonzales MRN: [id number] DOB: 1940/09/21 Today's Date: 08/08/2014    History of Present Illness Ian Gonzales is a 74 y.o. male with PMH of hypertension, hyperlipidemia, chronic back pain due to lumbar spinal stenosis, GERD, depression, metastatic adenocarcinoma of unknown primary questionable for lung cancer versus gastrointestinal diagnosed in May 2016, recently diagnosed stage IV lung cancer, stroke, COPD, who presents with altered mental status, shortness of breath, bloody stool, scrotal swelling and pain.  CT abdomen/pelvis with contrast showed stable loculated left pleural effusion with associated drainage catheter, mild hydrocele is noted in the left side of scrotum. 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    PT Comments    Patient limited with mobility due to diarrhea and poor activity tolerance.  Patient with skin breakdown and RN aware.  Will need SNF level rehab at d/c.  Follow Up Recommendations  SNF     Equipment Recommendations  Other (comment) (TBA)    Recommendations for Other Services       Precautions / Restrictions Precautions Precautions: Fall Precaution Comments: c-diff/enteric precautions    Mobility  Bed Mobility         Supine to sit: Supervision;HOB elevated Sit to supine: Supervision   General bed mobility comments: increased time, pt slow due to condom cath and sore scrotum/bottom  Transfers   Equipment used: None Transfers: Stand Pivot Transfers Sit to Stand: Min assist Stand pivot transfers: Min assist       General transfer comment: lifting assist, pt lowers to [facility] wtih UE support, pt able to pivot with UE reaching to armrest on BSC, assisted as well wtih pad under patient due to incontinent of liquid stool  Ambulation/Gait             General Gait Details: unable to attempt due to diarrhea   Stairs            Wheelchair Mobility     Modified Rankin (Stroke Patients Only)       Balance Overall balance assessment: Needs assistance         Standing balance support: Single extremity supported Standing balance-Leahy Scale: Poor Standing balance comment: assist standing for perineal hygiene pt flexed due to soreness in rectum, RN aware; second person needed to assist with hygiene                    Cognition Arousal/Alertness: Awake/alert Behavior During Therapy: WFL for tasks assessed/performed Overall Cognitive Status: No family/caregiver present to determine baseline cognitive functioning (h/o dementia)                      Exercises      General Comments        Pertinent Vitals/Pain Faces Pain Scale: Hurts little more Pain Location: rectum/buttocks Pain Descriptors / Indicators: Discomfort Pain Intervention(s): Monitored during session    Home Living                      Prior Function            PT Goals (current goals can now be found in the care plan section) Progress towards PT goals: Progressing toward goals    Frequency  Min 3X/week    PT Plan Current plan remains appropriate    Co-evaluation             End of Session   Activity Tolerance: Patient limited by pain (  and diarrhea) Patient left: in bed;with call bell/phone within reach;with bed alarm set     Time: 1400-1420 PT Time Calculation (min) (ACUTE ONLY): 20 min  Charges:  $Therapeutic Activity: 8-22 mins                    G Codes:      Lillie Bollig,CYNDI 09/02/14, 2:55 PM  Magda Kiel, [place] 09-02-14

## 2014-08-08 NOTE — Progress Notes (Signed)
Subjective:    1 - Left Epidiymorchitis - pt admitted from facility with few days metnal status chagnes and some left scrotal discomfort found to have likely left epidiymorchitis by CT and [place]. Leukocytosis to 27K. UCX, BCX obtainend and with some psudomonas sens cipro, gent, others.  On empiric Vanc + Zosyn on admission, transitioned to [place] 6/26 for maximal psudomonas coverage and then to Cipro monotherapy 6/28 as improving clinically and CX prove sensitive to this.  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  Imaging this admission with impressive left testicular and epidymal hyperemia and complex / reactive hydrocele. This fortunately appears away from IPP resevoir (rt pre pubic space), pump (rt hemicrotum), and cylinders.  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   PMH sig for metastatic adenocarcinoma (recieving chemo recently),COPD, Plurex catheter for chronic effusion, GI Bleed, severe dementia (lives in facility),. His POA is Ian Gonzales (friend) at [phone]  Today Ian Gonzales is clearly improving. Mental status improved and WBC trend continues down.   Objective: Vital signs in last 24 hours: Temp:  [97.6 F (36.4 C)-98.8 F (37.1 C)] 98.5 F (36.9 C) (06/28 2107) Pulse Rate:  [30-110] 79 (06/28 2107) Resp:  [18-26] 20 (06/28 2107) BP: (98-121)/(55-71) 113/55 mmHg (06/28 2107) SpO2:  [97 %-100 %] 100 % (06/28 2107) Last BM Date: 08/07/14  Intake/Output from previous day: 06/27 0701 - 06/28 0700 In: 1501.5 [P.O.:360; I.V.:240; Blood:690; IV Piggyback:211.5] Out: 1225 [Urine:1225] Intake/Output this shift:    General appearance: alert and cooperative Head: Normocephalic, without obvious abnormality, atraumatic Nose: Nares normal. Septum midline. Mucosa normal. No drainage or sinus tenderness. Resp: non-labored on  [place] O2. Plurex catheter in place.  GI: soft, non-tender; bowel sounds normal; no masses,  no organomegaly Male genitalia: Left testicular-epidiymal induraito continues to improved and remains w/o palpable infolvment of IPP components or overlying skin.  Extremities: extremities normal, atraumatic, no cyanosis or edema Lymph nodes: Cervical, supraclavicular, and axillary nodes normal. Neurologic: Mental status: Alert, oriented, thought content appropriate, AO x 2.   Lab Results:   Recent Labs  08/07/14 2102 08/08/14 1335  WBC 19.4* 17.7*  HGB 9.7* 10.2*  HCT 28.4* 30.2*  PLT 473* 537*   BMET  Recent Labs  08/06/14 0230 08/08/14 1335  NA 136 134*  K 3.4* 3.8  CL 106 104  CO2 21* 23  GLUCOSE 108* 89  BUN 17 13  CREATININE 0.86 0.87  CALCIUM 8.0* 8.1*   PT/INR No results for input(s): LABPROT, INR in the last 72 hours. ABG No results for input(s): PHART, HCO3 in the last 72 hours.  Invalid input(s): PCO2, PO2  Studies/Results: No results found.  Anti-infectives: Anti-infectives    Start     Dose/Rate Route Frequency Ordered Stop   08/08/14 1430  ciprofloxacin (CIPRO) IVPB 400 mg     400 mg 200 mL/hr over 60 Minutes Intravenous Every 12 hours 08/08/14 1416     08/07/14 2200  gentamicin (GARAMYCIN) 460 mg in dextrose 5 % 100 mL IVPB  Status:  Discontinued     460 mg 111.5 mL/hr over 60 Minutes Intravenous Every 36 hours 08/06/14 2257 08/08/14 1524   08/07/14 1330  vancomycin (VANCOCIN) 50 mg/mL oral solution 125 mg     125 mg Oral 4 times daily 08/07/14 1253 08/21/14 1359   08/06/14 1000  gentamicin (GARAMYCIN)  460 mg in dextrose 5 % 100 mL IVPB     7 mg/kg  66.3 kg 111.5 mL/hr over 60 Minutes Intravenous  Once 08/06/14 0906 08/06/14 1120   08/04/14 1800  vancomycin (VANCOCIN) IVPB 750 mg/150 ml premix  Status:  Discontinued     750 mg 150 mL/hr over 60 Minutes Intravenous Every 12 hours 08/04/14 0955 08/06/14 0825   08/04/14 0345  vancomycin (VANCOCIN) IVPB 1000  mg/200 mL premix     1,000 mg 200 mL/hr over 60 Minutes Intravenous  Once 08/04/14 0330 08/04/14 0727   08/04/14 0345  piperacillin-tazobactam (ZOSYN) IVPB 3.375 g  Status:  Discontinued     3.375 g 12.5 mL/hr over 240 Minutes Intravenous 3 times per day 08/04/14 0330 08/08/14 1416      Assessment/Plan:  1 - Left Epidiymorchitis - serial exam and imaging suggests presenly clinically localized and w/o infarction. Agree with current ABX. Fortunately also sens to Cipro which would be good agent at discharge for continued therapy for an additional 14 days or so.    Reinforced surgical indication with pt being progressive infection despite ABX or obvious infolvement of prosthesis and implications of surgery in his present disease state. Pt and his POA remain with very high threshold for surgery and I agree.   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 Will monitor for s/s infection with serial exams as per above.  3 - Will follow.   [facility], Ian Gonzales 08/08/2014

## 2014-08-09 ENCOUNTER — Encounter (HOSPITAL_COMMUNITY): Payer: Self-pay | Admitting: *Deleted

## 2014-08-09 ENCOUNTER — Ambulatory Visit: Payer: Self-pay

## 2014-08-09 ENCOUNTER — Other Ambulatory Visit: Payer: Self-pay

## 2014-08-09 ENCOUNTER — Ambulatory Visit: Payer: Self-pay | Admitting: Physician Assistant

## 2014-08-09 DIAGNOSIS — I10 Essential (primary) hypertension: Secondary | ICD-10-CM

## 2014-08-09 LAB — CULTURE, BLOOD (ROUTINE X 2)
CULTURE: NO GROWTH
Culture: NO GROWTH

## 2014-08-09 LAB — CBC
HCT: 29.1 % — ABNORMAL LOW (ref 39.0–52.0)
Hemoglobin: 9.8 g/dL — ABNORMAL LOW (ref 13.0–17.0)
MCH: 29.2 pg (ref 26.0–34.0)
MCHC: 33.7 g/dL (ref 30.0–36.0)
MCV: 86.6 fL (ref 78.0–100.0)
PLATELETS: 559 10*3/uL — AB (ref 150–400)
RBC: 3.36 MIL/uL — ABNORMAL LOW (ref 4.22–5.81)
RDW: 14.6 % (ref 11.5–15.5)
WBC: 18.9 10*3/uL — AB (ref 4.0–10.5)

## 2014-08-09 LAB — BASIC METABOLIC PANEL
Anion gap: 12 (ref 5–15)
BUN: 11 mg/dL (ref 6–20)
CO2: 23 mmol/L (ref 22–32)
CREATININE: 0.97 mg/dL (ref 0.61–1.24)
Calcium: 8.2 mg/dL — ABNORMAL LOW (ref 8.9–10.3)
Chloride: 98 mmol/L — ABNORMAL LOW (ref 101–111)
GFR calc non Af Amer: >60 mL/min (ref >60)
Glucose, Bld: 86 mg/dL (ref 65–99)
Potassium: 3.4 mmol/L — ABNORMAL LOW (ref 3.5–5.1)
SODIUM: 133 mmol/L — AB (ref 135–145)

## 2014-08-09 MED ORDER — NYSTATIN 100000 UNIT/ML MT SUSP
5.0000 mL | Freq: Four times a day (QID) | OROMUCOSAL | Status: DC
Start: 2014-08-09 — End: 2014-08-11
  Administered 2014-08-09 – 2014-08-11 (×7): 500000 [IU] via ORAL
  Filled 2014-08-09 (×10): qty 5

## 2014-08-09 MED ORDER — POTASSIUM CHLORIDE CRYS ER 20 MEQ PO TBCR
40.0000 meq | EXTENDED_RELEASE_TABLET | Freq: Two times a day (BID) | ORAL | Status: AC
  Administered 2014-08-09 (×2): 40 meq via ORAL
  Filled 2014-08-09 (×2): qty 2

## 2014-08-09 NOTE — Plan of Care (Signed)
Problem: Phase II Progression Outcomes Goal: Progress activity as tolerated unless otherwise ordered Outcome: Progressing Pt. Able to ambulate from bed to chair with +2 assist.

## 2014-08-09 NOTE — Clinical Social Work Note (Signed)
CSW reviewed chart. CSW notice the pt transferred to 2W. CSW provided the 2W CSW a handoff about the pt and pt's discharge disposition. This CSW will sign off.   [place], MSW, [place]

## 2014-08-09 NOTE — Care Management (Signed)
Important Message  Patient Details  Name: Ian Gonzales MRN: [id number] Date of Birth: 29-Sep-1940   Medicare Important Message Given:  Yes-third notification given    Maryclare Labrador, RN 08/09/2014, 10:50 AM

## 2014-08-09 NOTE — Progress Notes (Signed)
[facility] PROGRESS NOTE  Ian Gonzales KKX:[id number] DOB: 06/14/1940 DOA: 08/03/2014 PCP: Penni Homans, MD  Assessment/Plan:  1-Epididymo-orchitis, acute with sepsis: D/W Dr. Tresa Moore. Urine cx growing pseudomonas. Penile implant uninfected. Blood cultures remain negative. Pseudomonas sensitive to zosyn, ciprofloxacin. Gentamycin discontinue 6-28. Change zosyn to cipro 6-28. Plan to discharge on oral ciprofloxacin. Discussed with Dr Tresa Moore. Patient will need 2 weeks treatment after discharge.  -WBC still elevated.  -Antibiotics day 5.  2-Dyspnea: Chest x-ray without anything acute. Oxygen sat 100 %. Denies dyspnea.   3-C diff colitis; Started on vancomycin day 3. Diarrhea improved.   4- Acute encephalopathy secondary to anemia, infection: still confused. Baseline unclear.  5-  Recent CVA (cerebral vascular accident) resume Plavix.   6- Lung cancer on chemo, followed by Dr. Judie Petit, Acute GI bleed/  Multifactorial: chemo, heme pos stool, acute illness: S/P Transfuse 1 unit packed red blood cells 6-25.  Heme positive stool: Continue PPI. naprosyn held Had black stool. C diff positive.  Hb stable. Discussed with Dr Deatra Ina ok to resume plavix.   9- Hypokalemia: replete. 40 meq BID times 2.  10-Malignant pleural effusion: drain pleurex q2 days    Essential hypertension   COPD, stable   GERD   BPH (benign prostatic hyperplasia)   Spinal stenosis, lumbar region, with neurogenic claudication   Hereditary and idiopathic peripheral neuropathy   Carotid stenosis   Tobacco abuse   Protein-calorie malnutrition, severe:continue with remeron   Pressure ulcer, sacral per RN POA   Resume zoloft  Code Status:  full Family Communication:  Care discussed with patient Disposition Plan:  Back to SNF when more stable, awaiting improvement of WBC.   Consultants:  urology  Procedures:   none  Antibiotics:  Vanc 6/24 - 6/26  zosyn 6/24 -  Gentamycin  6/26  HPI/Subjective: No BM overnight. Denies abdominal , dyspnea.  Still with scrotum pain , intermittent.   Objective: Filed Vitals:   08/09/14 0534  BP: 129/72  Pulse: 85  Temp: 98.2 F (36.8 C)  Resp: 18    Intake/Output Summary (Last 24 hours) at 08/09/14 0812 Last data filed at 08/09/14 0600  Gross per 24 hour  Intake    120 ml  Output    725 ml  Net   -605 ml   Filed Weights   08/03/14 2300  Weight: 66.3 kg (146 lb 2.6 oz)    Exam:   General:  Alert in no Acute distress.   Cardiovascular: RRR without MGR  Respiratory: CTA without WRR  Abdomen: s, nt, nd  GU: swelling , mild redness scrotum, not worse  Basic Metabolic Panel:  Recent Labs Lab 08/04/14 0701 08/05/14 0352 08/06/14 0230 08/08/14 1335 08/09/14 0415  NA 136 137 136 134* 133*  K 3.6 3.4* 3.4* 3.8 3.4*  CL 103 106 106 104 98*  CO2 22 19* 21* 23 23  GLUCOSE 122* 102* 108* 89 86  BUN 30* 23* '17 13 11  '$ CREATININE 0.88 0.95 0.86 0.87 0.97  CALCIUM 8.2* 8.1* 8.0* 8.1* 8.2*  MG  --   --  1.7  --   --    Liver Function Tests: No results for input(s): AST, ALT, ALKPHOS, BILITOT, PROT, ALBUMIN in the last 168 hours. No results for input(s): LIPASE, AMYLASE in the last 168 hours. No results for input(s): AMMONIA in the last 168 hours. CBC:  Recent Labs Lab 08/06/14 0230 08/07/14 0230 08/07/14 2102 08/08/14 1335 08/09/14 0415  WBC 24.0* 21.7* 19.4* 17.7* 18.9*  NEUTROABS 19.9*  --   --   --   --   HGB 7.5* 7.4* 9.7* 10.2* 9.8*  HCT 22.9* 22.3* 28.4* 30.2* 29.1*  MCV 86.4 86.8 86.9 86.0 86.6  PLT 444* 456* 473* 537* 559*   Cardiac Enzymes: No results for input(s): CKTOTAL, CKMB, CKMBINDEX, TROPONINI in the last 168 hours. BNP (last 3 results)  Recent Labs  05/30/14 1630  BNP 25.2    ProBNP (last 3 results) No results for input(s): PROBNP in the last 8760 hours.  CBG: No results for input(s): GLUCAP in the last 168 hours.  Recent Results (from the past 240 hour(s))   MRSA PCR Screening     Status: None   Collection Time: 08/03/14 11:09 PM  Result Value Ref Range Status   MRSA by PCR NEGATIVE NEGATIVE Final    Comment:        The GeneXpert MRSA Assay (FDA approved for NASAL specimens only), is one component of a comprehensive MRSA colonization surveillance program. It is not intended to diagnose MRSA infection nor to guide or monitor treatment for MRSA infections.   Urine culture     Status: None   Collection Time: 08/04/14  5:04 AM  Result Value Ref Range Status   Specimen Description URINE, RANDOM  Final   Special Requests NONE  Final   Culture 70,000 COLONIES/ml PSEUDOMONAS AERUGINOSA  Final   Report Status 08/06/2014 FINAL  Final   Organism ID, Bacteria PSEUDOMONAS AERUGINOSA  Final      Susceptibility   Pseudomonas aeruginosa - MIC*    CEFTAZIDIME 4 SENSITIVE Sensitive     CIPROFLOXACIN <=0.25 SENSITIVE Sensitive     GENTAMICIN <=1 SENSITIVE Sensitive     IMIPENEM 1 SENSITIVE Sensitive     PIP/TAZO 8 SENSITIVE Sensitive     CEFEPIME 2 SENSITIVE Sensitive     * 70,000 COLONIES/ml PSEUDOMONAS AERUGINOSA  Culture, blood (x 2)     Status: None (Preliminary result)   Collection Time: 08/04/14  6:45 AM  Result Value Ref Range Status   Specimen Description BLOOD LEFT HAND  Final   Special Requests BOTTLES DRAWN AEROBIC ONLY 8CC  Final   Culture NO GROWTH 4 DAYS  Final   Report Status PENDING  Incomplete  Culture, blood (x 2)     Status: None (Preliminary result)   Collection Time: 08/04/14  6:54 AM  Result Value Ref Range Status   Specimen Description BLOOD LEFT HAND  Final   Special Requests BOTTLES DRAWN AEROBIC ONLY 4CC  Final   Culture NO GROWTH 4 DAYS  Final   Report Status PENDING  Incomplete  Clostridium Difficile by PCR (not at [facility])     Status: Abnormal   Collection Time: 08/07/14  8:55 AM  Result Value Ref Range Status   C difficile by pcr POSITIVE (A) NEGATIVE Final    Comment: CRITICAL RESULT CALLED TO, READ BACK BY  AND VERIFIED WITH: A. [facility] RN 11:35 08/07/14 ([email or web address])      Studies: No results found.  Scheduled Meds: . sodium chloride   Intravenous Once  . acetaminophen  650 mg Oral q morning - 10a  . amitriptyline  10 mg Oral QHS  . atorvastatin  20 mg Oral q1800  . ciprofloxacin  400 mg Intravenous Q12H  . clopidogrel  75 mg Oral Daily  . feeding supplement (ENSURE ENLIVE)  237 mL Oral TID WC  . feeding supplement (PRO-STAT SUGAR FREE 64)  60 mL Oral TID WC  . folic acid  1 mg Oral Daily  . mirtazapine  7.5 mg Oral QHS  . nicotine  14 mg Transdermal Daily  . potassium chloride  40 mEq Oral BID  . sertraline  50 mg Oral q morning - 10a  . vancomycin  125 mg Oral QID   Continuous Infusions: . sodium chloride 10 mL/hr at 08/07/14 1800    Time spent: 35 minutes  Regalado, Belkys A  [facility] [email or web address], password [facility] 08/09/2014, 8:12 AM  LOS: 6 days

## 2014-08-09 NOTE — Progress Notes (Signed)
Physical Therapy Treatment Patient Details Name: Ian Gonzales MRN: [id number] DOB: [birth year] Today's Date: 08/09/2014    History of Present Illness Ian Gonzales is a 74 y.o. male with PMH of hypertension, hyperlipidemia, chronic back pain due to lumbar spinal stenosis, GERD, depression, metastatic adenocarcinoma of unknown primary questionable for lung cancer versus gastrointestinal diagnosed in May 2016, recently diagnosed stage IV lung cancer, stroke, COPD, who presents with altered mental status, shortness of breath, bloody stool, scrotal swelling and pain.  CT abdomen/pelvis with contrast showed stable loculated left pleural effusion with associated drainage catheter, mild hydrocele is noted in the left side of scrotum. 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    PT Comments    Pt progressing towards physical therapy goals. Was able to ambulate in hall with RW and min assist for walker management and safety. Chair follow utilized however I don't feel it's necessary for next session. Continue to recommend SNF at d/c for continued therapy prior to return home.  Follow Up Recommendations  SNF     Equipment Recommendations  Other (comment) (TBA)    Recommendations for Other Services       Precautions / Restrictions Precautions Precautions: Fall Precaution Comments: c-diff/enteric precautions Restrictions Weight Bearing Restrictions: No    Mobility  Bed Mobility Overal bed mobility: Needs Assistance Bed Mobility: Supine to Sit     Supine to sit: Supervision;HOB elevated     General bed mobility comments: Supervision for safety and increased time to transition to full sitting position.   Transfers Overall transfer level: Needs assistance Equipment used: Rolling walker (2 wheeled) Transfers: Sit to/from Stand Sit to Stand: Min assist         General transfer comment: Assist to power-up to full standing position. Increased time to  gain/maintain balance prior to initiating gait training.   Ambulation/Gait Ambulation/Gait assistance: Min assist Ambulation Distance (Feet): 200 Feet Assistive device: Rolling walker (2 wheeled) Gait Pattern/deviations: Step-through pattern;Decreased stride length;Trunk flexed Gait velocity: Decreased Gait velocity interpretation: Below normal speed for age/gender General Gait Details: Occasional assist required for walker management. VC's for walker placement close to his body, and for improved posture. Chair follow utilized with 1 seated rest break.    Stairs            Wheelchair Mobility    Modified Rankin (Stroke Patients Only)       Balance Overall balance assessment: Needs assistance Sitting-balance support: Feet supported;No upper extremity supported Sitting balance-Leahy Scale: Fair     Standing balance support: Bilateral upper extremity supported;During functional activity Standing balance-Leahy Scale: Poor                      Cognition Arousal/Alertness: Awake/alert Behavior During Therapy: WFL for tasks assessed/performed Overall Cognitive Status: History of cognitive impairments - at baseline                      Exercises      General Comments        Pertinent Vitals/Pain Pain Assessment: No/denies pain    Home Living                      Prior Function            PT Goals (current goals can now be found in the care plan section) Acute Rehab PT Goals Patient Stated Goal: pt unable to relate his goals PT Goal Formulation: With patient Time  For Goal Achievement: 08/18/14 Potential to Achieve Goals: Good Progress towards PT goals: Progressing toward goals    Frequency  Min 3X/week    PT Plan Current plan remains appropriate    Co-evaluation             End of Session Equipment Utilized During Treatment: Gait belt Activity Tolerance: Patient limited by fatigue Patient left: in chair;with call  bell/phone within reach     Time: 1002-1025 PT Time Calculation (min) (ACUTE ONLY): 23 min  Charges:  $Gait Training: 8-22 mins $Therapeutic Activity: 8-22 mins                    G Codes:      Rolinda Roan 08/31/2014, 1:20 PM   Rolinda Roan, PT, DPT Acute Rehabilitation Services Pager: [phone]

## 2014-08-10 ENCOUNTER — Ambulatory Visit: Payer: Self-pay | Admitting: Physician Assistant

## 2014-08-10 ENCOUNTER — Other Ambulatory Visit: Payer: Self-pay

## 2014-08-10 ENCOUNTER — Ambulatory Visit: Payer: Self-pay

## 2014-08-10 LAB — CBC
HEMATOCRIT: 31.3 % — AB (ref 39.0–52.0)
Hemoglobin: 10.4 g/dL — ABNORMAL LOW (ref 13.0–17.0)
MCH: 29.1 pg (ref 26.0–34.0)
MCHC: 33.2 g/dL (ref 30.0–36.0)
MCV: 87.7 fL (ref 78.0–100.0)
PLATELETS: 612 10*3/uL — AB (ref 150–400)
RBC: 3.57 MIL/uL — AB (ref 4.22–5.81)
RDW: 14.8 % (ref 11.5–15.5)
WBC: 18.1 10*3/uL — ABNORMAL HIGH (ref 4.0–10.5)

## 2014-08-10 LAB — BASIC METABOLIC PANEL
Anion gap: 14 (ref 5–15)
BUN: 11 mg/dL (ref 6–20)
CO2: 21 mmol/L — ABNORMAL LOW (ref 22–32)
CREATININE: 1.14 mg/dL (ref 0.61–1.24)
Calcium: 8.5 mg/dL — ABNORMAL LOW (ref 8.9–10.3)
Chloride: 99 mmol/L — ABNORMAL LOW (ref 101–111)
GFR calc Af Amer: >60 mL/min (ref >60)
Glucose, Bld: 101 mg/dL — ABNORMAL HIGH (ref 65–99)
Potassium: 5.1 mmol/L (ref 3.5–5.1)
Sodium: 134 mmol/L — ABNORMAL LOW (ref 135–145)

## 2014-08-10 NOTE — Progress Notes (Addendum)
[facility] PROGRESS NOTE  Ian Gonzales JIR:[id number] DOB: [birth year] DOA: 08/03/2014 PCP: Penni Homans, MD  Assessment/Plan: Ian Gonzales is a 74 y.o. male with PMH of hypertension, hyperlipidemia, chronic back pain due to lumbar spinal stenosis, GERD, depression, metastatic adenocarcinoma of unknown primary questionable for lung cancer versus gastrointestinal diagnosed in May 2016, recently diagnosed stage IV lung cancer, stroke, COPD, who presents with altered mental status, shortness of breath, bloody stool, scrotal swelling and pain.   In ED, patient was found to have positive FOBT, hemoglobin dropped from 10.2 on 07/24/14 to 7.0. WBC 29.3, electrolytes okay, temperature normal, no tachycardia. Chest x-ray showed left pleural effusion with left basal atelectasis versus infiltration. CT abdomen/pelvis with contrast showed stable loculated left pleural effusion with associated drainage catheter, mild hydrocele is noted in the left side of scrotum. 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  Patient has been getting IV antibiotics for epididymitis. Urology following and helping with care. Patient Hb drop and develops diarrhea. He was diagnoses with C diff. He is getting oral vancomycin. We are waiting further decrease on WBC.   1-Epididymo-orchitis, acute with sepsis: D/W Dr. Tresa Moore. Urine cx growing pseudomonas. D/c vancomycin. Continue zosyn.  Penile implant uninfected. Blood cultures remain negative. Pseudomonas sensitive to zosyn, ciprofloxacin. Will discontinue gentamycin. Change zosyn to cipro. Plan to discharge on oral ciprofloxacin. Discussed with Dr Tresa Moore.  Patient still with pain, Dr Tresa Moore to follow today.   2-Dyspnea: Chest x-ray without anything acute. Oxygen sat 100 %. Denies dyspnea.   3-C diff colitis; Continue with vancomycin day 3. Diarrhea improved.   4- Acute encephalopathy secondary to anemia, infection: still confused. Baseline  unclear.  5-  Recent CVA (cerebral vascular accident) resume Plavix.   6- Lung cancer on chemo, followed by Dr. Judie Petit, Acute GI bleed/  Multifactorial: chemo, heme pos stool, acute illness: S/P Transfuse 1 unit packed red blood cells 6-25.  Heme positive stool: Continue PPI. ASA plavix, naprosyn held Had black stool. C diff positive.  Hb stable. Discussed with Dr Deatra Ina ok to resume plavix.   9- Hypokalemia: resolved.  10-Malignant pleural effusion: drain pleurex q2 days   Essential hypertension   COPD, stable   GERD   BPH (benign prostatic hyperplasia)   Spinal stenosis, lumbar region, with neurogenic claudication   Hereditary and idiopathic peripheral neuropathy   Carotid stenosis   Tobacco abuse   Protein-calorie malnutrition, severe:continue with remeron   Pressure ulcer, sacral per RN POA   Resume zoloft  Code Status:  full Family Communication:  Care discussed with patient. Glory Rosebush one of patient ' POA would like palliative care consult for goals of care Disposition Plan:  Back to SNF when more stable  Consultants:  urology  Procedures:   none  Antibiotics:  Vanc 6/24 - 6/26  zosyn 6/24 -  Gentamycin 6/26  HPI/Subjective: Patient is upset today. He is tired. He is not getting better. Wants to go home.  Would not harm himself. Refuse to speak with chaplain or palliative care.   Objective: Filed Vitals:   08/10/14 0434  BP: 116/61  Pulse: 90  Temp: 98.3 F (36.8 C)  Resp: 18    Intake/Output Summary (Last 24 hours) at 08/10/14 0920 Last data filed at 08/10/14 0600  Gross per 24 hour  Intake    120 ml  Output    350 ml  Net   -230 ml   Filed Weights   08/03/14 2300  Weight: 66.3  kg (146 lb 2.6 oz)    Exam:   General:  Alert in no Acute distress.   Cardiovascular: RRR without MGR  Respiratory: CTA without WRR  Abdomen: s, nt, nd  MO:QHUT redness of scrotum.   Basic Metabolic Panel:  Recent Labs Lab  08/05/14 0352 08/06/14 0230 08/08/14 1335 08/09/14 0415 08/10/14 0323  NA 137 136 134* 133* 134*  K 3.4* 3.4* 3.8 3.4* 5.1  CL 106 106 104 98* 99*  CO2 19* 21* 23 23 21*  GLUCOSE 102* 108* 89 86 101*  BUN 23* '17 13 11 11  '$ CREATININE 0.95 0.86 0.87 0.97 1.14  CALCIUM 8.1* 8.0* 8.1* 8.2* 8.5*  MG  --  1.7  --   --   --    Liver Function Tests: No results for input(s): AST, ALT, ALKPHOS, BILITOT, PROT, ALBUMIN in the last 168 hours. No results for input(s): LIPASE, AMYLASE in the last 168 hours. No results for input(s): AMMONIA in the last 168 hours. CBC:  Recent Labs Lab 08/06/14 0230 08/07/14 0230 08/07/14 2102 08/08/14 1335 08/09/14 0415 08/10/14 0323  WBC 24.0* 21.7* 19.4* 17.7* 18.9* 18.1*  NEUTROABS 19.9*  --   --   --   --   --   HGB 7.5* 7.4* 9.7* 10.2* 9.8* 10.4*  HCT 22.9* 22.3* 28.4* 30.2* 29.1* 31.3*  MCV 86.4 86.8 86.9 86.0 86.6 87.7  PLT 444* 456* 473* 537* 559* 612*   Cardiac Enzymes: No results for input(s): CKTOTAL, CKMB, CKMBINDEX, TROPONINI in the last 168 hours. BNP (last 3 results)  Recent Labs  05/30/14 1630  BNP 25.2    ProBNP (last 3 results) No results for input(s): PROBNP in the last 8760 hours.  CBG: No results for input(s): GLUCAP in the last 168 hours.  Recent Results (from the past 240 hour(s))  MRSA PCR Screening     Status: None   Collection Time: 08/03/14 11:09 PM  Result Value Ref Range Status   MRSA by PCR NEGATIVE NEGATIVE Final    Comment:        The GeneXpert MRSA Assay (FDA approved for NASAL specimens only), is one component of a comprehensive MRSA colonization surveillance program. It is not intended to diagnose MRSA infection nor to guide or monitor treatment for MRSA infections.   Urine culture     Status: None   Collection Time: 08/04/14  5:04 AM  Result Value Ref Range Status   Specimen Description URINE, RANDOM  Final   Special Requests NONE  Final   Culture 70,000 COLONIES/ml PSEUDOMONAS AERUGINOSA   Final   Report Status 08/06/2014 FINAL  Final   Organism ID, Bacteria PSEUDOMONAS AERUGINOSA  Final      Susceptibility   Pseudomonas aeruginosa - MIC*    CEFTAZIDIME 4 SENSITIVE Sensitive     CIPROFLOXACIN <=0.25 SENSITIVE Sensitive     GENTAMICIN <=1 SENSITIVE Sensitive     IMIPENEM 1 SENSITIVE Sensitive     PIP/TAZO 8 SENSITIVE Sensitive     CEFEPIME 2 SENSITIVE Sensitive     * 70,000 COLONIES/ml PSEUDOMONAS AERUGINOSA  Culture, blood (x 2)     Status: None   Collection Time: 08/04/14  6:45 AM  Result Value Ref Range Status   Specimen Description BLOOD LEFT HAND  Final   Special Requests BOTTLES DRAWN AEROBIC ONLY 8CC  Final   Culture NO GROWTH 5 DAYS  Final   Report Status 08/09/2014 FINAL  Final  Culture, blood (x 2)     Status: None  Collection Time: 08/04/14  6:54 AM  Result Value Ref Range Status   Specimen Description BLOOD LEFT HAND  Final   Special Requests BOTTLES DRAWN AEROBIC ONLY 4CC  Final   Culture NO GROWTH 5 DAYS  Final   Report Status 08/09/2014 FINAL  Final  Clostridium Difficile by PCR (not at [facility])     Status: Abnormal   Collection Time: 08/07/14  8:55 AM  Result Value Ref Range Status   C difficile by pcr POSITIVE (A) NEGATIVE Final    Comment: CRITICAL RESULT CALLED TO, READ BACK BY AND VERIFIED WITH: A. [facility] RN 11:35 08/07/14 ([email or web address])      Studies: No results found.  Scheduled Meds: . sodium chloride   Intravenous Once  . acetaminophen  650 mg Oral q morning - 10a  . amitriptyline  10 mg Oral QHS  . atorvastatin  20 mg Oral q1800  . ciprofloxacin  400 mg Intravenous Q12H  . clopidogrel  75 mg Oral Daily  . feeding supplement (ENSURE ENLIVE)  237 mL Oral TID WC  . feeding supplement (PRO-STAT SUGAR FREE 64)  60 mL Oral TID WC  . folic acid  1 mg Oral Daily  . mirtazapine  7.5 mg Oral QHS  . nicotine  14 mg Transdermal Daily  . nystatin  5 mL Oral QID  . sertraline  50 mg Oral q morning - 10a  . vancomycin  125 mg Oral QID   Continuous  Infusions: . sodium chloride 10 mL/hr at 08/07/14 1800    Time spent: 35 minutes  Regalado, Belkys A  [facility] [email or web address], password [email or web address] 08/10/2014, 9:20 AM  LOS: 7 days

## 2014-08-10 NOTE — Progress Notes (Signed)
Nutrition Follow-up  DOCUMENTATION CODES:  Severe malnutrition in context of chronic illness  INTERVENTION:  -Magic Cup TID -Ensure Enlive po TID, each supplement provides 350 kcal and 20 grams of protein -MVI  NUTRITION DIAGNOSIS:  Malnutrition related to chronic illness as evidenced by severe depletion of body fat, severe depletion of muscle mass.  Ongoing  GOAL:  Patient will meet greater than or equal to 90% of their needs  Unmet  MONITOR:  PO intake, Supplement acceptance, Weight trends, Labs  REASON FOR ASSESSMENT:  Malnutrition Screening Tool, Low Braden    ASSESSMENT: Patient admitted on 6/23 with AMS, SOB, bloody stool, and scrotal swelling and pain. Hx of metastatic adenocarcinoma of unknown primary (? Lung vs GI), recently diagnosed stage IV lung cancer, stroke, and COPD.   Pt's intake continues to be poor; noted 25-50% meal completion. He has also been refusing Ensure supplements. MD ordered Prostat supplement in 08/05/14, however, pt has been refusing this as well.   Pt's diet was downgraded to a dysphagia 1 diet on 08/08/14 due to complaints of difficulty eating (pt does not have his dentures with him).   Pt's family has been offering outside food as well; overheard family member asking if he wanted some cake.   Noted palliative care consult for goals of care has been requested by POA.   Height:  Ht Readings from Last 1 Encounters:  08/03/14 '5\' 9"'$  (1.753 m)    Weight:  Wt Readings from Last 1 Encounters:  08/03/14 146 lb 2.6 oz (66.3 kg)    Ideal Body Weight:  72.7 kg  Wt Readings from Last 10 Encounters:  08/03/14 146 lb 2.6 oz (66.3 kg)  07/24/14 151 lb 14.4 oz (68.901 kg)  07/06/14 163 lb 6.4 oz (74.118 kg)  06/29/14 165 lb 5.5 oz (75 kg)  06/22/14 164 lb 8 oz (74.617 kg)  06/13/14 177 lb 11.1 oz (80.6 kg)  06/09/14 179 lb 8 oz (81.421 kg)  05/26/14 185 lb 1.6 oz (83.961 kg)  05/09/14 184 lb 9.6 oz (83.734 kg)  05/04/14 186 lb (84.369  kg)    BMI:  Body mass index is 21.57 kg/(m^2).  Estimated Nutritional Needs:  Kcal:  2000-2300  Protein:  115-125 gm  Fluid:  >/= 2 L  Skin:  Wound (see comment) (stage II sacrum)  Diet Order:  DIET - DYS 1 Room service appropriate?: Yes; Fluid consistency:: Thin  EDUCATION NEEDS:  Education needs addressed   Intake/Output Summary (Last 24 hours) at 08/10/14 1450 Last data filed at 08/10/14 1245  Gross per 24 hour  Intake    240 ml  Output    350 ml  Net   -110 ml    Last BM:  08/10/14  Yaquelin Langelier A. Jimmye Norman, RD, LDN, CDE Pager: [phone] After hours Pager: [phone]

## 2014-08-10 NOTE — Progress Notes (Signed)
UR Completed. Ayn Domangue, RN, BSN.  [phone] 

## 2014-08-10 NOTE — Care Management Note (Signed)
Case Management Note  Patient Details  Name: Ian Gonzales MRN: [id number] Date of Birth: [birth year] Subjective/Objective:             From [place]/rehab . center , admitted with  altered mental status, shortness of breath, bloody stool, and scrotal swelling and pain. Hx of  malignant pleural  effusion  with pleurex drain.   Action/Plan: Return to snf/rehab. when medically stable. CM to f/u with d/c needs.   Expected Discharge Date:                  Expected Discharge Plan:  [place] (SNF/REHAB)  In-House Referral:  Clinical Social Work (csw, aware)  Discharge planning Services  CM Consult  Post Acute Care Choice:    Choice offered to:     DME Arranged:    DME Agency:     HH Arranged:    [place] Agency:     Status of Service:  In process, will continue to follow  Medicare Important Message Given:  Yes-third notification given Date Medicare IM Given:    Medicare IM give by:    Date Additional Medicare IM Given:    Additional Medicare Important Message give by:     If discussed at [place] of Stay Meetings, dates discussed:  08/10/14  Additional Comments: Cydney Ok (Friend) [phone] , Elicia Lamp [facility])  [phone]   Maryclare Labrador, RN,BSN,CM 08/10/2014, 3:56 PM [phone]

## 2014-08-10 NOTE — Progress Notes (Signed)
Subjective:  1 - Left Epidiymorchitis - pt admitted from facility with few days metnal status chagnes and some left scrotal discomfort found to have likely left epidiymorchitis by CT and [place]. Leukocytosis to 27K. UCX, BCX obtainend and with some psudomonas sens cipro, gent, others.  On empiric Vanc + Zosyn on admission, transitioned to [place] 6/26 for maximal psudomonas coverage and then to Cipro monotherapy 6/28 as improving clinically and CX prove sensitive to this.  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  Imaging this admission with impressive left testicular and epidymal hyperemia and complex / reactive hydrocele. This fortunately appears away from IPP resevoir (rt pre pubic space), pump (rt hemicrotum), and cylinders.  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   PMH sig for metastatic adenocarcinoma (recieving chemo recently),COPD, Plurex catheter for chronic effusion, GI Bleed, severe dementia (lives in facility),. His POA is Ian Gonzales (friend) at [phone]  Today Ian Gonzales is stable. Wants to be more ambulatory and eat more flavorful food. No interval fevers.   Objective: Vital signs in last 24 hours: Temp:  [98.3 F (36.8 C)-98.8 F (37.1 C)] 98.3 F (36.8 C) (06/30 0434) Pulse Rate:  [84-90] 90 (06/30 0434) Resp:  [18] 18 (06/30 0434) BP: (110-116)/(61-94) 116/61 mmHg (06/30 0434) SpO2:  [100 %] 100 % (06/30 0434) Last BM Date: 08/07/14  Intake/Output from previous day: 06/29 0701 - 06/30 0700 In: 120 [P.O.:120] Out: 350 [Urine:350] Intake/Output this shift: Total I/O In: 120 [P.O.:120] Out: -   General appearance: alert, cooperative and appears older than stated age Head: Normocephalic, without obvious abnormality, atraumatic Eyes: negative Back: symmetric, no curvature. ROM normal. No CVA tenderness. Resp:  non-labored on room air Cardio: Nl rate GI: soft, non-tender; bowel sounds normal; no masses,  no organomegaly Male genitalia: continued improvement of left testicular induration, less pain on exam. No induration / pain / skin changes over Rt hemiscrotum, corpora, pre-pubic area.  Extremities: extremities normal, atraumatic, no cyanosis or edema Lymph nodes: Cervical, supraclavicular, and axillary nodes normal. Neurologic: Mental status: AOx2, seems at baseline per history and discussion with POA.   Lab Results:   Recent Labs  08/09/14 0415 08/10/14 0323  WBC 18.9* 18.1*  HGB 9.8* 10.4*  HCT 29.1* 31.3*  PLT 559* 612*   BMET  Recent Labs  08/09/14 0415 08/10/14 0323  NA 133* 134*  K 3.4* 5.1  CL 98* 99*  CO2 23 21*  GLUCOSE 86 101*  BUN 11 11  CREATININE 0.97 1.14  CALCIUM 8.2* 8.5*   PT/INR No results for input(s): LABPROT, INR in the last 72 hours. ABG No results for input(s): PHART, HCO3 in the last 72 hours.  Invalid input(s): PCO2, PO2  Studies/Results: No results found.  Anti-infectives: Anti-infectives    Start     Dose/Rate Route Frequency Ordered Stop   08/08/14 1430  ciprofloxacin (CIPRO) IVPB 400 mg     400 mg 200 mL/hr over 60 Minutes Intravenous Every 12 hours 08/08/14 1416     08/07/14 2200  gentamicin (GARAMYCIN) 460 mg in dextrose 5 % 100 mL IVPB  Status:  Discontinued     460 mg 111.5 mL/hr over 60 Minutes Intravenous Every 36 hours 08/06/14 2257 08/08/14 1524   08/07/14 1330  vancomycin (VANCOCIN) 50 mg/mL oral solution 125 mg     125 mg Oral 4  times daily 08/07/14 1253 08/21/14 1359   08/06/14 1000  gentamicin (GARAMYCIN) 460 mg in dextrose 5 % 100 mL IVPB     7 mg/kg  66.3 kg 111.5 mL/hr over 60 Minutes Intravenous  Once 08/06/14 0906 08/06/14 1120   08/04/14 1800  vancomycin (VANCOCIN) IVPB 750 mg/150 ml premix  Status:  Discontinued     750 mg 150 mL/hr over 60 Minutes Intravenous Every 12 hours 08/04/14 0955 08/06/14 0825   08/04/14  0345  vancomycin (VANCOCIN) IVPB 1000 mg/200 mL premix     1,000 mg 200 mL/hr over 60 Minutes Intravenous  Once 08/04/14 0330 08/04/14 0727   08/04/14 0345  piperacillin-tazobactam (ZOSYN) IVPB 3.375 g  Status:  Discontinued     3.375 g 12.5 mL/hr over 240 Minutes Intravenous 3 times per day 08/04/14 0330 08/08/14 1416      Assessment/Plan:  1 - Left Epidiymorchitis - improving by serial exams. Agree with current ABX and likely transition to PO Cipro which would be good agent at discharge for continued therapy for an additional 14 days or so post discharge. .    Reinforced surgical indication with pt being progressive infection despite ABX or obvious infolvement of prosthesis and implications of surgery in his present disease state. Pt and his POA remain with very high threshold for surgery and I agree.   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 Will monitor for s/s infection with serial exams as per above.  3 - Will follow, has GU follow up request pending in few weeks post DC.   [facility], Ian Gonzales 08/10/2014

## 2014-08-10 NOTE — Progress Notes (Signed)
Pt. Had an episode of extreme agitation. Pt. Refused meds, treatment, and care. Pt. Attempting to spit on staff. MD notified. Family  Friend/POA present. Juluis Rainier, RN

## 2014-08-10 NOTE — Progress Notes (Addendum)
Dr. Tresa Moore from urology made aware pt. Is still c/o penile pain and pain in the scrotum. MD stated he is ok with pt. Being discharged whenever attending clears pt. to go.  Juluis Rainier, RN

## 2014-08-11 ENCOUNTER — Telehealth: Payer: Self-pay | Admitting: *Deleted

## 2014-08-11 ENCOUNTER — Ambulatory Visit: Payer: Medicare Other | Attending: Family Medicine

## 2014-08-11 ENCOUNTER — Telehealth: Payer: Self-pay | Admitting: Internal Medicine

## 2014-08-11 DIAGNOSIS — C3400 Malignant neoplasm of unspecified main bronchus: Secondary | ICD-10-CM

## 2014-08-11 DIAGNOSIS — E43 Unspecified severe protein-calorie malnutrition: Secondary | ICD-10-CM

## 2014-08-11 DIAGNOSIS — E876 Hypokalemia: Secondary | ICD-10-CM

## 2014-08-11 DIAGNOSIS — G9340 Encephalopathy, unspecified: Secondary | ICD-10-CM

## 2014-08-11 DIAGNOSIS — D490 Neoplasm of unspecified behavior of digestive system: Secondary | ICD-10-CM

## 2014-08-11 DIAGNOSIS — K219 Gastro-esophageal reflux disease without esophagitis: Secondary | ICD-10-CM

## 2014-08-11 DIAGNOSIS — I639 Cerebral infarction, unspecified: Secondary | ICD-10-CM

## 2014-08-11 DIAGNOSIS — D508 Other iron deficiency anemias: Secondary | ICD-10-CM

## 2014-08-11 DIAGNOSIS — J910 Malignant pleural effusion: Secondary | ICD-10-CM

## 2014-08-11 DIAGNOSIS — Z515 Encounter for palliative care: Secondary | ICD-10-CM

## 2014-08-11 LAB — BASIC METABOLIC PANEL
ANION GAP: 9 (ref 5–15)
BUN: 12 mg/dL (ref 6–20)
CALCIUM: 8.5 mg/dL — AB (ref 8.9–10.3)
CO2: 25 mmol/L (ref 22–32)
Chloride: 100 mmol/L — ABNORMAL LOW (ref 101–111)
Creatinine, Ser: 1.01 mg/dL (ref 0.61–1.24)
GFR calc Af Amer: >60 mL/min (ref >60)
GFR calc non Af Amer: >60 mL/min (ref >60)
GLUCOSE: 108 mg/dL — AB (ref 65–99)
POTASSIUM: 3.9 mmol/L (ref 3.5–5.1)
SODIUM: 134 mmol/L — AB (ref 135–145)

## 2014-08-11 LAB — CBC
HCT: 32.4 % — ABNORMAL LOW (ref 39.0–52.0)
Hemoglobin: 10.7 g/dL — ABNORMAL LOW (ref 13.0–17.0)
MCH: 29 pg (ref 26.0–34.0)
MCHC: 33 g/dL (ref 30.0–36.0)
MCV: 87.8 fL (ref 78.0–100.0)
Platelets: 627 10*3/uL — ABNORMAL HIGH (ref 150–400)
RBC: 3.69 MIL/uL — ABNORMAL LOW (ref 4.22–5.81)
RDW: 14.8 % (ref 11.5–15.5)
WBC: 18.2 10*3/uL — ABNORMAL HIGH (ref 4.0–10.5)

## 2014-08-11 MED ORDER — VANCOMYCIN 50 MG/ML ORAL SOLUTION: 125.0000 mg | Freq: Four times a day (QID) | ORAL | Status: AC

## 2014-08-11 MED ORDER — ENSURE ENLIVE PO LIQD: 237.0000 mL | Freq: Three times a day (TID) | ORAL | Status: AC

## 2014-08-11 MED ORDER — NYSTATIN 100000 UNIT/ML MT SUSP: 5.0000 mL | Freq: Four times a day (QID) | OROMUCOSAL | Status: AC

## 2014-08-11 MED ORDER — CIPROFLOXACIN HCL 500 MG PO TABS: 500.0000 mg | ORAL_TABLET | Freq: Two times a day (BID) | ORAL | Status: AC

## 2014-08-11 MED ORDER — POLYETHYLENE GLYCOL 3350 17 G PO PACK: 17.0000 g | PACK | Freq: Every day | ORAL | Status: AC | PRN

## 2014-08-11 MED ORDER — LORAZEPAM 0.5 MG PO TABS: 0.5000 mg | ORAL_TABLET | Freq: Four times a day (QID) | ORAL | Status: AC | PRN

## 2014-08-11 MED ORDER — OXYCODONE HCL 5 MG PO TABS: 5.0000 mg | ORAL_TABLET | ORAL | Status: AC | PRN

## 2014-08-11 NOTE — Evaluation (Addendum)
Clinical/Bedside Swallow Evaluation Patient Details  Name: Ian Gonzales MRN: [id number] Date of Birth: 02-Feb-1941  Today's Date: 08/11/2014 Time: SLP Start Time (ACUTE ONLY): 9758 SLP Stop Time (ACUTE ONLY): 0940 SLP Time Calculation (min) (ACUTE ONLY): 15 min  Past Medical History:  Past Medical History  Diagnosis Date  . Hypertension   . GERD (gastroesophageal reflux disease)   . Colon polyps   . Back pain, lumbosacral   . Sinusitis acute 03/24/2011  . Memory loss 04/16/2011  . Allergic state 05/30/2011  . Leukocytosis 12/15/2011  . Hyperlipidemia 02/15/2012  . Unspecified sinusitis (chronic) 04/05/2012  . Epicondylitis 10/10/2012  . Shortness of breath     WITH EXERTION AND BACK PAIN  . Wound drainage     L ELBOW  . Spinal stenosis, lumbar   . Nocturia   . Headache 05/05/2014  . Lung cancer    Past Surgical History:  Past Surgical History  Procedure Laterality Date  . Lumbar laminectomy  1974    twice  . Urethral stricture dilatation    . Colonoscopy    . Penile prosthesis implant    . Cataracts      REMOVED  . Decompressive lumbar laminectomy level 2 N/A 11/26/2012    Procedure: CENTRAL DECOMPRESSION LUMBAR LAMINECTOMY L3-L4,  L4-L5   ;  Surgeon: Tobi Bastos, MD;  Location: [facility];  Service: Orthopedics;  Laterality: N/A;  . Chest tube insertion Left 05/31/2014    Procedure: INSERTION OF LEFT PLEURAL DRAINAGE CATHETER;  Surgeon: Grace Isaac, MD;  Location: [place];  Service: Thoracic;  Laterality: Left;  . Pleural effusion drainage Left 05/31/2014    Procedure: DRAINAGE OF LEFT PLEURAL EFFUSION;  Surgeon: Grace Isaac, MD;  Location: [place];  Service: Thoracic;  Laterality: Left;  . Esophagogastroduodenoscopy (egd) with propofol N/A 06/29/2014    Procedure: ESOPHAGOGASTRODUODENOSCOPY (EGD) WITH PROPOFOL;  Surgeon: Milus Banister, MD;  Location: [facility];  Service: Endoscopy;  Laterality: N/A;   HPI:  Pt is a 74 y.o. male with PMH of hypertension,  hyperlipidemia, chronic back pain due to lumbar spinal stenosis, GERD, depression, metastatic adenocarcinoma of unknown primary questionable for lung cancer versus gastrointestinal diagnosed in May 2016, recently diagnosed stage IV lung cancer, stroke (identified 5/3- L parietal lobe), COPD, who presented to the ED 6/24 with altered mental status, shortness of breath, bloody stool, scrotal swelling and pain. Upon admission pt with elevated WBC and seemed to have SOB and dry cough. CXR 6/25 showed known L pleural effusion (malignant). Pt has had 11% weight loss in the past month. Pt was followed by SLP on previous admission following CVA; recommended diet at that time was dysphagia 2/ thin liquids for R side weakness/ sensation which resulted in pocketing. To date pt continues to have elevated WBCs and episodes of confusion. Diet was downgraded to dysphagia 1 on 6/28 due to c/o difficulty eating without dentures. Bedside swallow eval ordered to assess swallow function.    Assessment / Plan / Recommendation Clinical Impression  Limited bedside swallow eval as pt refused foods. Upon entering room noted pt with R labial and trace R lingual residue indicating continued decreased R side sensation which was present following CVA in May. Pt demonstrated no overt s/s of aspiration of thin liquids; however, suspect mildly delayed swallow initiation. Pt demonstrating some language of confusion this a.m. but ~75% of speech is intelligible and coherent- agitation increased throughout evaluation with further attempts at PO trials. Pt had c/o difficulties eating because of  not having dentures at hospital- pt believes they were lost in the hospital, which was why diet was downgraded to dysphagia 1. Also c/o having to eat the same things all the time. Recommend cautiously advancing diet to dysphagia 2/ thin liquid diet as was recommended on previous admission in hopes of increasing PO intake, provide full supervision to check  for R side pocketing/ anterior spillage. Downgrade to dysphagia 1 if s/s of aspiration occur or significant difficulty with mastication. SLP will continue to follow to attempt trials of solids.     Aspiration Risk  Mild    Diet Recommendation Dysphagia 2;Thin   Medication Administration: Whole meds with puree Compensations: Slow rate;Small sips/bites;Check for pocketing    Other  Recommendations Oral Care Recommendations: Oral care BID   Follow Up Recommendations       Frequency and Duration min 2x/week  1 week   Pertinent Vitals/Pain n/a    SLP Swallow Goals     Swallow Study Prior Functional Status       General Other Pertinent Information: Pt is a 74 y.o. male with PMH of hypertension, hyperlipidemia, chronic back pain due to lumbar spinal stenosis, GERD, depression, metastatic adenocarcinoma of unknown primary questionable for lung cancer versus gastrointestinal diagnosed in May 2016, recently diagnosed stage IV lung cancer, stroke (identified 5/3- L parietal lobe), COPD, who presented to the ED 6/24 with altered mental status, shortness of breath, bloody stool, scrotal swelling and pain. Upon admission pt with elevated WBC and seemed to have SOB and dry cough. CXR 6/25 showed known L pleural effusion (malignant). Pt has had 11% weight loss in the past month. Pt was followed by SLP on previous admission following CVA; recommended diet at that time was dysphagia 2/ thin liquids for R side weakness/ sensation which resulted in pocketing. To date pt continues to have elevated WBCs and episodes of confusion. Diet was downgraded to dysphagia 1 on 6/28 due to c/o difficulty eating without dentures. Bedside swallow eval ordered to assess swallow function.  Type of Study: Bedside swallow evaluation Diet Prior to this Study: Dysphagia 1 (puree);Thin liquids Temperature Spikes Noted: No Respiratory Status: Room air History of Recent Intubation: No Behavior/Cognition:  Alert;Agitated;Requires cueing Oral Cavity - Dentition: Edentulous;Other (Comment) (dentures at home?) Self-Feeding Abilities: Able to feed self Patient Positioning: Upright in bed Baseline Vocal Quality: Normal Volitional Cough: Strong Volitional Swallow: Unable to elicit    Oral/Motor/Sensory Function Overall Oral Motor/Sensory Function: Impaired at baseline Labial ROM: Reduced right Labial Symmetry: Within Functional Limits Labial Strength: Reduced Labial Sensation: Reduced Lingual Sensation: Reduced   Ice Chips Ice chips: Not tested   Thin Liquid Thin Liquid: Impaired Presentation: Straw;Cup Pharyngeal  Phase Impairments: Suspected delayed Swallow    Nectar Thick Nectar Thick Liquid: Not tested   Honey Thick Honey Thick Liquid: Not tested   Puree Puree: Not tested   Solid   GO    Solid: Not tested       Kern Reap, MA, CCC-SLP 08/11/2014,9:46 AM  [phone]

## 2014-08-11 NOTE — Consult Note (Signed)
Consultation Note Date: 08/11/2014   Patient Name: Ian Gonzales  DOB: 21-Jun-1940  MRN: [id number]  Age / Sex: 74 y.o., male   PCP: Mosie Lukes, MD Referring Physician: Barton Dubois, MD  Reason for Consultation: Establishing goals of care  Palliative Care Assessment and Plan Summary of Established Goals of Care and Medical Treatment Preferences    Palliative Care Discussion Held Today:   Ian Gonzales is lying in bed. He is very frustrated about his health, his diet, and his situation overall. He comments to me "just let me kick the bucket and be done with it all." He denies pain. He does tell me "they are trying to kick me out" - discharge noted for today. He very much wants to return to his own home and I am not sure if he understands the barriers his health has on making this possible now. I attempted to provide reassurance and empowerment for him but I fear without much success.  I did speak with POA - Glory Rosebush. She tells me that he has voiced to her stopping chemotherapy. She is interested to know more information about prognosis and hospice option. She does not think he is strong enough for more chemotherapy at this point. She wants to respect his wishes. She has noticed him to become more and more frustrated and refusing care but also believes he is "not ready to go." She does not believe that he understands the severity of his cancer and condition. She is interested in meeting with palliative with herself, patient and his girlfriend to discuss options/resources for home/goals and priorities. She would also like to be able to get him home but knows he will need 24/7 help. Unfortunately he is to be discharged today but I will try and help arrange palliative meeting at cancer center as an outpatient.    Contacts/Participants in Discussion: Primary Decision Maker: Cydney Ok POA   Goals of Care/Code Status/Advance Care Planning:   Code Status: FULL - will need to be  discussed further with patient and POA   Symptom Management:   Bowel regimen: Consider Senokot-S 1 tablet qhs.    Pain: Continue oxy IR 5 mg every 4 hours prn.   Dyspnea: Continue recommendations to drain Pleurx.   Psycho-social/Spiritual:   Support System: POA seems supportive and says he has a girlfriend that is supportive but they are trying to get him into a facility closer to them so they can be with him more.   Prognosis: Likely < 6 months  Discharge Planning:  SNF       Chief Complaint: AMS/SOB/scrotal swelling and pain/bloody stool  History of Present Illness: 74 y.o. male with PMH of hypertension, hyperlipidemia, chronic back pain due to lumbar spinal stenosis, GERD, depression, metastatic adenocarcinoma of unknown primary questionable for lung cancer versus gastrointestinal diagnosed in May 2016, recently diagnosed stage IV lung cancer, stroke, COPD, who presents with altered mental status, shortness of breath, bloody stool, scrotal swelling and pain. In ED, patient was found to have positive FOBT, hemoglobin dropped from 10.2 on 07/24/14 to 7.0. WBC 29.3. Chest x-ray showed left pleural effusion with left basal atelectasis versus infiltration. CT abdomen/pelvis with contrast showed stable loculated left pleural effusion with associated drainage catheter, mild hydrocele is noted in the left side of scrotum. CT head showed old left MCA infarct, no acute abnormalities.Patient has been getting IV antibiotics for epididymitis. Urology following and helping with care. He was diagnoses with C diff. He is getting oral vancomycin.  Was scheduled to begin cycle 2 of chemotherapy soon for metastatic adenocarcinoma but has had significant weight loss and POA has requested palliative care consult.   Primary Diagnoses  Present on Admission:  . Hereditary and idiopathic peripheral neuropathy . Tobacco abuse . Spinal stenosis, lumbar region, with neurogenic claudication . Neoplasm of  hypopharynx . Lung cancer . BPH (benign prostatic hyperplasia) . GERD . Essential hypertension . ERECTILE DYSFUNCTION . CVA (cerebral vascular accident) . COPD, severity to be determined . Carotid stenosis . Acute encephalopathy . Epididymo-orchitis, acute . Heme positive stool . Protein-calorie malnutrition, severe . Malignant pleural effusion . Anemia  Palliative Review of Systems:   Denies pain currently.    I have reviewed the medical record, interviewed the patient and family, and examined the patient. The following aspects are pertinent.  Past Medical History  Diagnosis Date  . Hypertension   . GERD (gastroesophageal reflux disease)   . Colon polyps   . Back pain, lumbosacral   . Sinusitis acute 03/24/2011  . Memory loss 04/16/2011  . Allergic state 05/30/2011  . Leukocytosis 12/15/2011  . Hyperlipidemia 02/15/2012  . Unspecified sinusitis (chronic) 04/05/2012  . Epicondylitis 10/10/2012  . Shortness of breath     WITH EXERTION AND BACK PAIN  . Wound drainage     L ELBOW  . Spinal stenosis, lumbar   . Nocturia   . Headache 05/05/2014  . Lung cancer    History   Social History  . Marital Status: Single    Spouse Name: N/A  . Number of Children: 0  . Years of Education: N/A   Occupational History  . retired    Social History Main Topics  . Smoking status: Current Every Day Smoker -- 0.25 packs/day for 63 years    Types: Cigarettes  . Smokeless tobacco: Former Systems developer    Types: Chew     Comment: 3-5 cigs daily  . Alcohol Use: 0.0 oz/week    0 Standard drinks or equivalent per week     Comment: once in a while   . Drug Use: No  . Sexual Activity: No   Other Topics Concern  . None   Social History Narrative   History reviewed. No pertinent family history. Scheduled Meds: . sodium chloride   Intravenous Once  . acetaminophen  650 mg Oral q morning - 10a  . amitriptyline  10 mg Oral QHS  . atorvastatin  20 mg Oral q1800  . ciprofloxacin  400 mg  Intravenous Q12H  . clopidogrel  75 mg Oral Daily  . feeding supplement (ENSURE ENLIVE)  237 mL Oral TID WC  . folic acid  1 mg Oral Daily  . mirtazapine  7.5 mg Oral QHS  . nicotine  14 mg Transdermal Daily  . nystatin  5 mL Oral QID  . sertraline  50 mg Oral q morning - 10a  . vancomycin  125 mg Oral QID   Continuous Infusions: . sodium chloride 10 mL/hr at 08/07/14 1800   PRN Meds:.baclofen, bisacodyl, ipratropium-albuterol, LORazepam, ondansetron (ZOFRAN) IV, oxyCODONE, sodium chloride Medications Prior to Admission:  Prior to Admission medications   Medication Sig Start Date End Date Taking? Authorizing Provider  acetaminophen (TYLENOL) 325 MG tablet Take 650 mg by mouth every morning.   Yes Historical Provider, MD  Alum & Mag Hydroxide-Simeth (MAGIC MOUTHWASH) SOLN Take 5 mLs by mouth 4 (four) times daily as needed for mouth pain. Swish and spit Patient taking differently: Take 5 mLs by mouth 4 (four) times  daily. Swish and spit 07/24/14  Yes Adrena E Johnson, PA-C  Amino Acids-Protein Hydrolys (FEEDING SUPPLEMENT, PRO-STAT SUGAR FREE 64,) LIQD Take 60 mLs by mouth 3 (three) times daily with meals.   Yes Historical Provider, MD  amitriptyline (ELAVIL) 10 MG tablet Take 10 mg by mouth at bedtime.   Yes Historical Provider, MD  amLODipine (NORVASC) 5 MG tablet Take 5 mg by mouth daily.  06/01/14  Yes Historical Provider, MD  aspirin 81 MG tablet Take 1 tablet (81 mg total) by mouth daily. Hold until 06/01/14; due to pleurex catheter placement; then resume therapy as previously indictaed 05/31/14  Yes Barton Dubois, MD  atorvastatin (LIPITOR) 20 MG tablet Take 1 tablet (20 mg total) by mouth daily at 6 PM. 06/17/14  Yes Allie Bossier, MD  baclofen (LIORESAL) 5 mg TABS tablet Take 5 mg by mouth 3 (three) times daily.   Yes Historical Provider, MD  bisacodyl (DULCOLAX) 5 MG EC tablet Take 10 mg by mouth daily as needed for moderate constipation.   Yes Historical Provider, MD  clopidogrel  (PLAVIX) 75 MG tablet Take 1 tablet (75 mg total) by mouth daily. 06/17/14  Yes Allie Bossier, MD  dexamethasone (DECADRON) 4 MG tablet Take 1 tablet (4 mg total) by mouth 2 (two) times daily with a meal. The day before, the day of and the day after chemotherapy 07/06/14  Yes Adrena E Johnson, PA-C  fluticasone (FLONASE) 50 MCG/ACT nasal spray Place 1 spray into both nostrils daily.   Yes Historical Provider, MD  folic acid (FOLVITE) 1 MG tablet Take 1 tablet (1 mg total) by mouth daily. 07/06/14  Yes Adrena E Johnson, PA-C  loratadine (CLARITIN) 10 MG tablet Take 10 mg by mouth daily.   Yes Historical Provider, MD  mirtazapine (REMERON) 7.5 MG tablet Take 7.5 mg by mouth at bedtime.   Yes Historical Provider, MD  naproxen sodium (ANAPROX) 220 MG tablet Take 220 mg by mouth every evening.    Yes Historical Provider, MD  nitroGLYCERIN (NITROSTAT) 0.4 MG SL tablet Place 0.4 mg under the tongue every 5 (five) minutes as needed for chest pain.   Yes Historical Provider, MD  OVER THE COUNTER MEDICATION Take 80 mLs by mouth 4 (four) times daily. Med pass   Yes Historical Provider, MD  OVER THE COUNTER MEDICATION Take 1 Container by mouth 3 (three) times daily with meals. Magic cup   Yes Historical Provider, MD  potassium chloride SA (K-DUR,KLOR-CON) 20 MEQ tablet Take 40 mEq by mouth once.   Yes Historical Provider, MD  prochlorperazine (COMPAZINE) 10 MG tablet Take 1 tablet (10 mg total) by mouth every 6 (six) hours as needed for nausea or vomiting. 07/06/14  Yes Adrena E Johnson, PA-C  ranitidine (ZANTAC) 300 MG tablet take 1 tablet by mouth at bedtime 03/15/14  Yes Mosie Lukes, MD  sertraline (ZOLOFT) 50 MG tablet Take 50 mg by mouth every morning.   Yes Historical Provider, MD  sodium chloride (OCEAN) 0.65 % SOLN nasal spray Place 1 spray into both nostrils as needed for congestion.   Yes Historical Provider, MD  traMADol (ULTRAM) 50 MG tablet Take 1 tablet (50 mg total) by mouth every 8 (eight) hours as  needed for severe pain. 05/31/14  Yes Barton Dubois, MD  Water For Irrigation, Sterile (FREE WATER) SOLN Place 150 mLs into feeding tube 4 (four) times daily.   Yes Historical Provider, MD  ciprofloxacin (CIPRO) 500 MG tablet Take 1 tablet (500 mg total)  by mouth 2 (two) times daily. FOR 10 DAYS 08/11/14   Barton Dubois, MD  feeding supplement, ENSURE ENLIVE, (ENSURE ENLIVE) LIQD Take 237 mLs by mouth 3 (three) times daily between meals. 08/11/14   Barton Dubois, MD  levofloxacin (LEVAQUIN) 500 MG tablet Take 500 mg by mouth daily. Started 08/04/14, for 10 days ending 08/12/14    Historical Provider, MD  LORazepam (ATIVAN) 0.5 MG tablet Take 1 tablet (0.5 mg total) by mouth every 6 (six) hours as needed for anxiety (agitation). 08/11/14   Barton Dubois, MD  nystatin (MYCOSTATIN) 100000 UNIT/ML suspension Take 5 mLs (500,000 Units total) by mouth 4 (four) times daily. 08/11/14   Barton Dubois, MD  oxyCODONE (OXY IR/ROXICODONE) 5 MG immediate release tablet Take 1 tablet (5 mg total) by mouth every 4 (four) hours as needed for severe pain. 08/11/14   Barton Dubois, MD  polyethylene glycol [facility] [place]) packet Take 17 g by mouth daily as needed for moderate constipation. 08/11/14   Barton Dubois, MD  vancomycin (VANCOCIN) 50 mg/mL oral solution Take 2.5 mLs (125 mg total) by mouth 4 (four) times daily. FOR 20 DAYS 08/11/14   Barton Dubois, MD   Allergies  Allergen Reactions  . Gabapentin Other (See Comments)    Dysfunction of equilibrium   . Ibuprofen Other (See Comments)    "It made me feel worse"  . Valacyclovir Other (See Comments)    constipation   CBC:    Component Value Date/Time   WBC 18.2* 08/11/2014 0256   WBC 8.1 07/24/2014 1132   HGB 10.7* 08/11/2014 0256   HGB 10.2* 07/24/2014 1132   HCT 32.4* 08/11/2014 0256   HCT 30.4* 07/24/2014 1132   PLT 627* 08/11/2014 0256   PLT 468* 07/24/2014 1132   MCV 87.8 08/11/2014 0256   MCV 86.8 07/24/2014 1132   NEUTROABS 19.9* 08/06/2014 0230    NEUTROABS 7.0* 07/24/2014 1132   LYMPHSABS 1.9 08/06/2014 0230   LYMPHSABS 0.9 07/24/2014 1132   MONOABS 2.2* 08/06/2014 0230   MONOABS 0.0* 07/24/2014 1132   EOSABS 0.0 08/06/2014 0230   EOSABS 0.1 07/24/2014 1132   BASOSABS 0.0 08/06/2014 0230   BASOSABS 0.0 07/24/2014 1132   Comprehensive Metabolic Panel:    Component Value Date/Time   NA 134* 08/11/2014 0256   NA 136 07/24/2014 1132   K 3.9 08/11/2014 0256   K 4.1 07/24/2014 1132   CL 100* 08/11/2014 0256   CO2 25 08/11/2014 0256   CO2 21* 07/24/2014 1132   BUN 12 08/11/2014 0256   BUN 28.5* 07/24/2014 1132   CREATININE 1.01 08/11/2014 0256   CREATININE 1.0 07/24/2014 1132   CREATININE 0.77 05/04/2014 1458   GLUCOSE 108* 08/11/2014 0256   GLUCOSE 107 07/24/2014 1132   GLUCOSE 101* 02/18/2006 0944   CALCIUM 8.5* 08/11/2014 0256   CALCIUM 9.3 07/24/2014 1132   AST 18 07/24/2014 1132   AST 17 06/15/2014 0822   ALT 26 07/24/2014 1132   ALT 17 06/15/2014 0822   ALKPHOS 95 07/24/2014 1132   ALKPHOS 83 06/15/2014 0822   BILITOT 0.65 07/24/2014 1132   BILITOT 0.6 06/15/2014 0822   PROT 6.8 07/24/2014 1132   PROT 6.7 06/15/2014 0822   ALBUMIN 2.9* 07/24/2014 1132   ALBUMIN 2.9* 06/15/2014 0822    Physical Exam:  Vital Signs: BP 118/62 mmHg  Pulse 94  Temp(Src) 98.7 F (37.1 C) (Other (Comment))  Resp 18  Ht '5\' 9"'$  (1.753 m)  Wt 66.3 kg (146 lb 2.6 oz)  BMI 21.57 kg/m2  SpO2 92% SpO2: SpO2: 92 % O2 Device: O2 Device: Not Delivered O2 Flow Rate: O2 Flow Rate (L/min): 2 L/min Intake/output summary:  Intake/Output Summary (Last 24 hours) at 08/11/14 1209 Last data filed at 08/10/14 1245  Gross per 24 hour  Intake    120 ml  Output      0 ml  Net    120 ml   LBM: Last BM Date: 08/10/14 Baseline Weight: Weight: 66.3 kg (146 lb 2.6 oz) Most recent weight: Weight: 66.3 kg (146 lb 2.6 oz)  Exam Findings:   General: NAD, lying in bed HEENT: Temporal muscle wasting CVS: RRR Resp: No labored breathing Abd:  Soft, NT, ND Neuro: Awake, alert, oriented to person/place/somewhat to situation           Palliative Performance Scale: 30 %                Additional Data Reviewed: Recent Labs     08/10/14  0323  08/11/14  0256  WBC  18.1*  18.2*  HGB  10.4*  10.7*  PLT  612*  627*  NA  134*  134*  BUN  11  12  CREATININE  1.14  1.01     Time In: 1120 Time Out: 1220 Time Total: 57mn  Greater than 50%  of this time was spent counseling and coordinating care related to the above assessment and plan.   Signed by:  AVinie Sill NP Palliative Medicine Team Pager # [phone](M-F 8a-5p) Team Phone # [phone](Nights/Weekends)

## 2014-08-11 NOTE — Telephone Encounter (Signed)
[place] friend and POA for pt called with concerns for pt. Pt is being d/c today from [facility] to a rehab facility.  [place] has concerns regarding how long pt will have without treatment.  Discussed with [place] pt and support system can speak with MD at f/u appt about concerns and discuss pt's wishes. No further concerns at this tims POF to scheduling for f/u appt with MD.

## 2014-08-11 NOTE — Progress Notes (Signed)
Patient has been discharged. IV has been removed. Taken by EMS to the skilled nursing facility.  Ian Gonzales

## 2014-08-11 NOTE — Progress Notes (Signed)
Subjective:   1 - Left Epidiymorchitis - pt admitted from facility with few days metnal status chagnes and some left scrotal discomfort found to have likely left epidiymorchitis by CT and [place]. Leukocytosis to 27K. UCX, BCX obtainend and with some psudomonas sens cipro, gent, others.  On empiric Vanc + Zosyn on admission, transitioned to [place] [facility] [place] 6/26 for maximal psudomonas coverage and then to Cipro monotherapy 6/28 as improving clinically and CX prove sensitive to this.  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  Imaging this admission with impressive left testicular and epidymal hyperemia and complex / reactive hydrocele. This fortunately appears away from IPP resevoir (rt pre pubic space), pump (rt hemicrotum), and cylinders.  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   PMH sig for metastatic adenocarcinoma (recieving chemo recently),COPD, Plurex catheter for chronic effusion, GI Bleed, severe dementia (lives in facility),. His POA is virgina Hall Busing (friend) at [phone]  Today Luisenrique only complains of some nausea. Plan to discharge soon. Remains afebrile.  Objective: Vital signs in last 24 hours: Temp:  [97.9 F (36.6 C)-98.7 F (37.1 C)] 97.9 F (36.6 C) (07/01 1307) Pulse Rate:  [82-95] 95 (07/01 1307) Resp:  [18] 18 (07/01 1307) BP: (93-118)/(58-91) 93/58 mmHg (07/01 1307) SpO2:  [92 %-98 %] 98 % (07/01 1307) Last BM Date: 08/10/14  Intake/Output from previous day: 06/30 0701 - 07/01 0700 In: 120 [P.O.:120] Out: -  Intake/Output this shift: Total I/O In: 240 [P.O.:240] Out: 500 [Urine:500]  General appearance: alert, cooperative and appears older than stated age Eyes: negative Neck: supple, symmetrical, trachea midline Back: symmetric, no curvature. ROM normal. No CVA tenderness. Resp: mild stable tachypnea GI:  soft, non-tender; bowel sounds normal; no masses,  no organomegaly Male genitalia: Continued improvement in left scrotal / testicular induration. No palpable involvment of Rt hemiscrotum IPP components, IPP cylinders, or IPP resivoir (pre-pubic).  Extremities: extremities normal, atraumatic, no cyanosis or edema Skin: Skin color, texture, turgor normal. No rashes or lesions Lymph nodes: Cervical, supraclavicular, and axillary nodes normal.  Lab Results:   Recent Labs  08/10/14 0323 08/11/14 0256  WBC 18.1* 18.2*  HGB 10.4* 10.7*  HCT 31.3* 32.4*  PLT 612* 627*   BMET  Recent Labs  08/10/14 0323 08/11/14 0256  NA 134* 134*  K 5.1 3.9  CL 99* 100*  CO2 21* 25  GLUCOSE 101* 108*  BUN 11 12  CREATININE 1.14 1.01  CALCIUM 8.5* 8.5*   PT/INR No results for input(s): LABPROT, INR in the last 72 hours. ABG No results for input(s): PHART, HCO3 in the last 72 hours.  Invalid input(s): PCO2, PO2  Studies/Results: No results found.  Anti-infectives: Anti-infectives    Start     Dose/Rate Route Frequency Ordered Stop   08/11/14 0000  vancomycin (VANCOCIN) 50 mg/mL oral solution     125 mg Oral 4 times daily 08/11/14 1107     08/11/14 0000  ciprofloxacin (CIPRO) 500 MG tablet     500 mg Oral 2 times daily 08/11/14 1107     08/08/14 1430  ciprofloxacin (CIPRO) IVPB 400 mg     400 mg 200 mL/hr over 60 Minutes Intravenous Every 12 hours 08/08/14 1416     08/07/14 2200  gentamicin (GARAMYCIN) 460 mg in dextrose 5 % 100 mL IVPB  Status:  Discontinued  460 mg 111.5 mL/hr over 60 Minutes Intravenous Every 36 hours 08/06/14 2257 08/08/14 1524   08/07/14 1330  vancomycin (VANCOCIN) 50 mg/mL oral solution 125 mg     125 mg Oral 4 times daily 08/07/14 1253 08/21/14 1359   08/06/14 1000  gentamicin (GARAMYCIN) 460 mg in dextrose 5 % 100 mL IVPB     7 mg/kg  66.3 kg 111.5 mL/hr over 60 Minutes Intravenous  Once 08/06/14 0906 08/06/14 1120   08/04/14 1800  vancomycin (VANCOCIN)  IVPB 750 mg/150 ml premix  Status:  Discontinued     750 mg 150 mL/hr over 60 Minutes Intravenous Every 12 hours 08/04/14 0955 08/06/14 0825   08/04/14 0345  vancomycin (VANCOCIN) IVPB 1000 mg/200 mL premix     1,000 mg 200 mL/hr over 60 Minutes Intravenous  Once 08/04/14 0330 08/04/14 0727   08/04/14 0345  piperacillin-tazobactam (ZOSYN) IVPB 3.375 g  Status:  Discontinued     3.375 g 12.5 mL/hr over 240 Minutes Intravenous 3 times per day 08/04/14 0330 08/08/14 1416      Assessment/Plan:  1 - Left Epidiymorchitis - continues to improve by serial exams. Agree with current ABX and likely transition to PO Cipro which would be good agent at discharge for continued therapy for an additional 14 days or so post discharge. .    Reinforced surgical indication with pt being progressive infection despite ABX or obvious infolvement of prosthesis and implications of surgery in his present disease state. Pt and his POA remain with very high threshold for surgery and I agree.   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 Will monitor for s/s infection with serial exams as per above.  3 - Will follow, has GU follow up requested and pending.   [facility], [place] 08/11/2014

## 2014-08-11 NOTE — Telephone Encounter (Signed)
s.w. pt and advised on July appt...ok and aware

## 2014-08-11 NOTE — Discharge Summary (Signed)
Physician Discharge Summary  Ian Gonzales ZOX:[id number] DOB: [birth year] DOA: 08/03/2014  PCP: Penni Homans, MD  Admit date: 08/03/2014 Discharge date: 08/11/2014  Time spent: >30 minutes  Recommendations for Outpatient Follow-up:  PALLIATIVE CARE TO FOLLOW PATIENT AT FACILITY TO HELP WITH FUTURE GOALS OF CARE AND TRANSITION OF THERAPY BASE ON DECISIONS FOLLOW UP WITH ONCOLOGY SERVICE AS PREVIOUSLY SCHEDULE PLEASE TAKE CIPROFLOXACIN 500 MG TWICE A DAY FOR 10 MORE DAYS CONTINUE VANCOMYCIN FOUR TIMES A DAY FOR 20 DAYS FOLLOW DYSPHAGIA 2 DIET, THIN LIQUIDS AND MEDICATIONS TO BE GIVEN AS WHOLE WITH APPLE SAUCE MAINTAIN ADEQUATE HYDRATION  Discharge Diagnoses:  Principal Problem:   Epididymo-orchitis, acute Active Problems:   ERECTILE DYSFUNCTION   Essential hypertension   COPD, severity to be determined   GERD   BPH (benign prostatic hyperplasia)   Spinal stenosis, lumbar region, with neurogenic claudication   Hereditary and idiopathic peripheral neuropathy   Malignant pleural effusion   CVA (cerebral vascular accident)   Carotid stenosis   Acute encephalopathy   Lung cancer   Neoplasm of hypopharynx   Tobacco abuse   Heme positive stool   Protein-calorie malnutrition, severe   Pressure ulcer   Anemia   Hypokalemia   Pseudomembranous colitis   Discharge Condition: stable and improved. Will discharge to SNF for further care, assistance and rehabilitation. Palliative Care to follow him at the facility and transition to comfort only went appropriate   Diet recommendation: dysphagia 2 diet with thin liquids  Filed Weights   08/03/14 2300  Weight: 66.3 kg (146 lb 2.6 oz)    History of present illness:  74 y.o. male with PMH of hypertension, hyperlipidemia, chronic back pain due to lumbar spinal stenosis, GERD, depression, metastatic adenocarcinoma of unknown primary questionable for lung cancer versus gastrointestinal diagnosed in May 2016, recently diagnosed stage IV  lung cancer, stroke, COPD, who presents with altered mental status, shortness of breath, bloody stool, scrotal swelling and pain.   In ED, patient was found to have positive FOBT, hemoglobin dropped from 10.2 on 07/24/14 to 7.0. WBC 29.3, electrolytes okay, temperature normal, no tachycardia. Chest x-ray showed left pleural effusion with left basal atelectasis versus infiltration. CT abdomen/pelvis with contrast showed stable loculated left pleural effusion with associated drainage catheter, mild hydrocele is noted in the left side of scrotum. 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  Hospital Course:  1-Epididymo-orchitis, acute with sepsis due to pseudomonas: D/W Dr. Tresa Moore. Urine cx growing pseudomonas.  -received approx 6 days of IV antibiotics and broad spectrum. -at discharge patient w/o fever, tachycardia and WBC's trending down -per urology ok to d/c home on ciprofloxacin PO antibiotics with treatment for another 10 days to complete a total of 14 days.   2-Dyspnea: Chest x-ray without anything acute. Oxygen sat 100 % on RA. Denies SOB or CP -continue PRN drainage through pleurodex catheter as needed for dyspnea.  -also to be done every dis   3-C diff colitis; Continue with vancomycin day 4/24. Diarrhea improved.   4- Acute encephalopathy secondary to anemia, infection and dementia -back to baseline according to POA (Ms Cydney Ok) -patient high risk for sundowning; continue constant reorientation  5- Recent CVA (cerebral vascular accident) resume Plavix for secondary prevention -no new deificit appreciated   6- Lung cancer on chemo -patient frustrated and wishing to discontinue further chemotherapy -will discussed with Dr. Judie Petit of chronic disease with Acute GI bleed component   -Multifactorial: chemo, heme pos stool, acute illness and  infection: S/P Transfuse 1 unit packed red blood) cells on 6-25, during this admission.  -Heme positive stool:  Continue PPI. ASA and plavix -C diff positive.  -Hb stable at discharge. Discussed with Dr Deatra Ina (GI) ok to resume plavix.   8-depression: continue the use of zoloft and PRN ativan  9- Hypokalemia: repleted -will discharge on maintenance therapy -most likely associated with GI loses  10-Malignant pleural effusion: drain pleurex catheter every 2 days  Procedures:  See below for x-ray reports   Consultations:  Urology  GI (Dr. Deatra Ina)   Discharge Exam: Filed Vitals:   08/11/14 0501  BP: 118/62  Pulse: 94  Temp: 98.7 F (37.1 C)  Resp: 18    General: afebrile, no nausea, no vomiting and without complaints of CP or SOB. Just 1 loose stool per day and almost complete resolution of his testis swelling/cellulitis. Positive mild thrush appreciated inside his mouth Cardiovascular: s1 and s2, no rubs or gallops Respiratory: good air movement, scattered rhonchi, no wheezing. pleurodex in place Abd: soft, NT, ND, positive BS  Discharge Instructions   Discharge Instructions    Diet - low sodium heart healthy    Complete by:  As directed      Discharge instructions    Complete by:  As directed   PALLIATIVE CARE TO FOLLOW PATIENT AT FACILITY TO HELP WITH FUTURE GOALS OF CARE AND TRANSITION OF THERAPY BASE ON DECISIONS FOLLOW UP WITH ONCOLOGY SERVICE AS PREVIOUSLY SCHEDULE PLEASE TAKE CIPROFLOXACIN 500 MG TWICE A DAY FOR 10 MORE DAYS CONTINUE VANCOMYCIN FOUR TIMES A DAY FOR 20 DAYS FOLLOW DYSPHAGIA 2 DIET, THIN LIQUIDS AND MEDICATIONS TO BE GIVEN AS WHOLE WITH APPLE SAUCE MAINTAIN ADEQUATE HYDRATION          Current Discharge Medication List    START taking these medications   Details  ciprofloxacin (CIPRO) 500 MG tablet Take 1 tablet (500 mg total) by mouth 2 (two) times daily. FOR 10 DAYS    feeding supplement, ENSURE ENLIVE, (ENSURE ENLIVE) LIQD Take 237 mLs by mouth 3 (three) times daily between meals. Qty: 237 mL, Refills: 12    LORazepam (ATIVAN) 0.5 MG tablet  Take 1 tablet (0.5 mg total) by mouth every 6 (six) hours as needed for anxiety (agitation). Qty: 30 tablet, Refills: 0    nystatin (MYCOSTATIN) 100000 UNIT/ML suspension Take 5 mLs (500,000 Units total) by mouth 4 (four) times daily. Qty: 60 mL, Refills: 0    vancomycin (VANCOCIN) 50 mg/mL oral solution Take 2.5 mLs (125 mg total) by mouth 4 (four) times daily. FOR 20 DAYS      CONTINUE these medications which have CHANGED   Details  oxyCODONE (OXY IR/ROXICODONE) 5 MG immediate release tablet Take 1 tablet (5 mg total) by mouth every 4 (four) hours as needed for severe pain. Qty: 30 tablet, Refills: 0    polyethylene glycol (MIRALAX / GLYCOLAX) packet Take 17 g by mouth daily as needed for moderate constipation. Qty: 14 each, Refills: 0      CONTINUE these medications which have NOT CHANGED   Details  acetaminophen (TYLENOL) 325 MG tablet Take 650 mg by mouth every morning.    Alum & Mag Hydroxide-Simeth (MAGIC MOUTHWASH) SOLN Take 5 mLs by mouth 4 (four) times daily as needed for mouth pain. Swish and spit Qty: 160 mL, Refills: 0    Amino Acids-Protein Hydrolys (FEEDING SUPPLEMENT, PRO-STAT SUGAR FREE 64,) LIQD Take 60 mLs by mouth 3 (three) times daily with meals.    amitriptyline (ELAVIL)  10 MG tablet Take 10 mg by mouth at bedtime.    aspirin 81 MG tablet Take 1 tablet (81 mg total) by mouth daily. Hold until 06/01/14; due to pleurex catheter placement; then resume therapy as previously indictaed Qty: 30 tablet    atorvastatin (LIPITOR) 20 MG tablet Take 1 tablet (20 mg total) by mouth daily at 6 PM. Qty: 30 tablet, Refills: 0    baclofen (LIORESAL) 5 mg TABS tablet Take 5 mg by mouth 3 (three) times daily.    bisacodyl (DULCOLAX) 5 MG EC tablet Take 10 mg by mouth daily as needed for moderate constipation.    clopidogrel (PLAVIX) 75 MG tablet Take 1 tablet (75 mg total) by mouth daily. Qty: 30 tablet, Refills: 0    dexamethasone (DECADRON) 4 MG tablet Take 1 tablet (4  mg total) by mouth 2 (two) times daily with a meal. The day before, the day of and the day after chemotherapy Qty: 30 tablet, Refills: 0    fluticasone (FLONASE) 50 MCG/ACT nasal spray Place 1 spray into both nostrils daily.    folic acid (FOLVITE) 1 MG tablet Take 1 tablet (1 mg total) by mouth daily. Qty: 30 tablet, Refills: 3    loratadine (CLARITIN) 10 MG tablet Take 10 mg by mouth daily.    mirtazapine (REMERON) 7.5 MG tablet Take 7.5 mg by mouth at bedtime.    naproxen sodium (ANAPROX) 220 MG tablet Take 220 mg by mouth every evening.    Associated Diagnoses: Metastatic adenocarcinoma of unknown origin    OVER THE COUNTER MEDICATION Take 1 Container by mouth 3 (three) times daily with meals. Magic cup    potassium chloride SA (K-DUR,KLOR-CON) 20 MEQ tablet Take 40 mEq by mouth once.    prochlorperazine (COMPAZINE) 10 MG tablet Take 1 tablet (10 mg total) by mouth every 6 (six) hours as needed for nausea or vomiting. Qty: 30 tablet, Refills: 0    ranitidine (ZANTAC) 300 MG tablet take 1 tablet by mouth at bedtime Qty: 30 tablet, Refills: 5    sertraline (ZOLOFT) 50 MG tablet Take 50 mg by mouth every morning.    sodium chloride (OCEAN) 0.65 % SOLN nasal spray Place 1 spray into both nostrils as needed for congestion.    Water For Irrigation, Sterile (FREE WATER) SOLN Place 150 mLs into feeding tube 4 (four) times daily.      STOP taking these medications     amLODipine (NORVASC) 5 MG tablet      nitroGLYCERIN (NITROSTAT) 0.4 MG SL tablet      traMADol (ULTRAM) 50 MG tablet      levofloxacin (LEVAQUIN) 500 MG tablet      nicotine (NICODERM CQ) 14 mg/24hr patch        Allergies  Allergen Reactions  . Gabapentin Other (See Comments)    Dysfunction of equilibrium   . Ibuprofen Other (See Comments)    "It made me feel worse"  . Valacyclovir Other (See Comments)    constipation     The results of significant diagnostics from this hospitalization (including  imaging, microbiology, ancillary and laboratory) are listed below for reference.    Significant Diagnostic Studies: US Scrotum  08/04/2014   CLINICAL DATA:  Scrotal cellulitis  EXAM: ULTRASOUND OF SCROTUM  TECHNIQUE: Complete ultrasound examination of the testicles, epididymis, and other scrotal structures was performed. Doppler imaging was not ordered and therefore was not performed.  COMPARISON:  None  FINDINGS: Right testicle  Measurements: 4.2 x 2.1 x 2.4 cm.  Normal morphology without mass. 2 mm calcification within RIGHT testis. Internal blood flow present within RIGHT testis.  Left testicle  Measurements: 4.2 x 3.0 x 3.7 cm. Diffusely heterogeneous echogenicity. Hypervascularity on color Doppler imaging. Findings most consistent with diffuse orchitis. No focal mass or calcification.  Right epididymis:  Normal in size and appearance.  Left epididymis: Hypervascular on color Doppler imaging without focal mass.  Hydrocele: Complicated LEFT peritesticular fluid collection with numerous septations and loculations likely representing a pyocele.  Varicocele:  None identified  Thickened wall of LEFT hemiscrotum with hypervascularity and scattered soft tissue edema consistent with cellulitis.  IMPRESSION: No acute RIGHT testicular abnormalities.  Heterogeneous hypervascular LEFT testis and epididymis compatible with epididymal orchitis.  LEFT scrotal cellulitis.  Markedly complex LEFT scrotal fluid collection likely representing a pyocele.   Electronically Signed   By: Lavonia Dana M.D.   On: 08/04/2014 14:09   Dg Chest Port 1 View  08/05/2014   CLINICAL DATA:  Shortness of Breath  EXAM: PORTABLE CHEST - 1 VIEW  COMPARISON:  08/03/2014  FINDINGS: Left-sided pleural effusion is again identified. A drainage catheter is noted in place. No pneumothorax is noted. Right lung is clear. The cardiac shadow is stable. No acute bony abnormality is seen.  IMPRESSION: Stable left pleural effusion with drainage catheter in  place.   Electronically Signed   By: Inez Catalina M.D.   On: 08/05/2014 08:40    Microbiology: Recent Results (from the past 240 hour(s))  MRSA PCR Screening     Status: None   Collection Time: 08/03/14 11:09 PM  Result Value Ref Range Status   MRSA by PCR NEGATIVE NEGATIVE Final    Comment:        The GeneXpert MRSA Assay (FDA approved for NASAL specimens only), is one component of a comprehensive MRSA colonization surveillance program. It is not intended to diagnose MRSA infection nor to guide or monitor treatment for MRSA infections.   Urine culture     Status: None   Collection Time: 08/04/14  5:04 AM  Result Value Ref Range Status   Specimen Description URINE, RANDOM  Final   Special Requests NONE  Final   Culture 70,000 COLONIES/ml PSEUDOMONAS AERUGINOSA  Final   Report Status 08/06/2014 FINAL  Final   Organism ID, Bacteria PSEUDOMONAS AERUGINOSA  Final      Susceptibility   Pseudomonas aeruginosa - MIC*    CEFTAZIDIME 4 SENSITIVE Sensitive     CIPROFLOXACIN <=0.25 SENSITIVE Sensitive     GENTAMICIN <=1 SENSITIVE Sensitive     IMIPENEM 1 SENSITIVE Sensitive     PIP/TAZO 8 SENSITIVE Sensitive     CEFEPIME 2 SENSITIVE Sensitive     * 70,000 COLONIES/ml PSEUDOMONAS AERUGINOSA  Culture, blood (x 2)     Status: None   Collection Time: 08/04/14  6:45 AM  Result Value Ref Range Status   Specimen Description BLOOD LEFT HAND  Final   Special Requests BOTTLES DRAWN AEROBIC ONLY 8CC  Final   Culture NO GROWTH 5 DAYS  Final   Report Status 08/09/2014 FINAL  Final  Culture, blood (x 2)     Status: None   Collection Time: 08/04/14  6:54 AM  Result Value Ref Range Status   Specimen Description BLOOD LEFT HAND  Final   Special Requests BOTTLES DRAWN AEROBIC ONLY 4CC  Final   Culture NO GROWTH 5 DAYS  Final   Report Status 08/09/2014 FINAL  Final  Clostridium Difficile by PCR (not at [facility])  Status: Abnormal   Collection Time: 08/07/14  8:55 AM  Result Value Ref Range  Status   C difficile by pcr POSITIVE (A) NEGATIVE Final    Comment: CRITICAL RESULT CALLED TO, READ BACK BY AND VERIFIED WITH: A. [facility] RN 11:35 08/07/14 ([email or web address])      Labs: Basic Metabolic Panel:  Recent Labs Lab 08/06/14 0230 08/08/14 1335 08/09/14 0415 08/10/14 0323 08/11/14 0256  NA 136 134* 133* 134* 134*  K 3.4* 3.8 3.4* 5.1 3.9  CL 106 104 98* 99* 100*  CO2 21* 23 23 21* 25  GLUCOSE 108* 89 86 101* 108*  BUN '17 13 11 11 12  '$ CREATININE 0.86 0.87 0.97 1.14 1.01  CALCIUM 8.0* 8.1* 8.2* 8.5* 8.5*  MG 1.7  --   --   --   --    CBC:  Recent Labs Lab 08/06/14 0230  08/07/14 2102 08/08/14 1335 08/09/14 0415 08/10/14 0323 08/11/14 0256  WBC 24.0*  < > 19.4* 17.7* 18.9* 18.1* 18.2*  NEUTROABS 19.9*  --   --   --   --   --   --   HGB 7.5*  < > 9.7* 10.2* 9.8* 10.4* 10.7*  HCT 22.9*  < > 28.4* 30.2* 29.1* 31.3* 32.4*  MCV 86.4  < > 86.9 86.0 86.6 87.7 87.8  PLT 444*  < > 473* 537* 559* 612* 627*  < > = values in this interval not displayed.  BNP (last 3 results)  Recent Labs  05/30/14 1630  BNP 25.2    Signed:  Barton Dubois  [facility] 08/11/2014, 11:08 AM

## 2014-08-11 NOTE — Progress Notes (Signed)
Patient will discharge to [facility] and [place], [place], [place] Anticipated discharge date: 08/11/14 Family notified: pt [place] by [employer]- 3:45pm Report #: [phone]  [place] signing off.  Domenica Reamer, [place] Social Worker [phone]

## 2014-08-16 ENCOUNTER — Other Ambulatory Visit: Payer: Self-pay

## 2014-08-17 ENCOUNTER — Other Ambulatory Visit: Payer: Self-pay

## 2014-08-17 ENCOUNTER — Ambulatory Visit: Payer: Self-pay | Admitting: Nurse Practitioner

## 2014-08-23 ENCOUNTER — Other Ambulatory Visit: Payer: Self-pay

## 2014-08-24 ENCOUNTER — Other Ambulatory Visit: Payer: Self-pay

## 2014-08-29 ENCOUNTER — Encounter: Payer: Self-pay | Admitting: Medical Oncology

## 2014-08-29 NOTE — Progress Notes (Signed)
[facility] 7/14 -pt scheduled for 08/30/12

## 2014-08-30 ENCOUNTER — Ambulatory Visit: Payer: Self-pay | Admitting: Internal Medicine

## 2014-08-30 ENCOUNTER — Other Ambulatory Visit: Payer: Self-pay

## 2014-08-30 ENCOUNTER — Ambulatory Visit: Payer: Self-pay

## 2014-08-31 ENCOUNTER — Ambulatory Visit: Payer: Self-pay | Admitting: Internal Medicine

## 2014-08-31 ENCOUNTER — Ambulatory Visit: Payer: Self-pay

## 2014-08-31 ENCOUNTER — Telehealth: Payer: Self-pay | Admitting: *Deleted

## 2014-08-31 ENCOUNTER — Other Ambulatory Visit: Payer: Self-pay

## 2014-08-31 ENCOUNTER — Encounter: Payer: Self-pay | Admitting: *Deleted

## 2014-08-31 NOTE — Progress Notes (Signed)
Oncology Nurse Navigator Documentation  Oncology Nurse Navigator Flowsheets 08/31/2014  Navigator Encounter Type Other/Patient missed treatment and appt with Dr. Julien Nordmann today.  I called [facility] facility and he is not there anymore.  I called his house and cell phone and was unable to reach patient.  Dr. Julien Nordmann is aware.   Patient Visit Type Follow-up  Treatment Phase Treatment  Barriers/Navigation Needs Education  Education Other  Interventions Other  Time Spent with Patient 30

## 2014-09-11 DEATH — deceased

## 2014-09-18 ENCOUNTER — Other Ambulatory Visit: Payer: Self-pay | Admitting: *Deleted

## 2014-09-18 DIAGNOSIS — I6523 Occlusion and stenosis of bilateral carotid arteries: Secondary | ICD-10-CM

## 2014-09-19 ENCOUNTER — Encounter: Payer: Self-pay | Admitting: Vascular Surgery

## 2014-09-19 NOTE — Telephone Encounter (Signed)
Encounter opened in error

## 2014-09-20 ENCOUNTER — Ambulatory Visit: Payer: Medicare Other | Admitting: Vascular Surgery

## 2014-09-20 ENCOUNTER — Encounter (HOSPITAL_COMMUNITY): Payer: Medicare Other

## 2014-10-03 ENCOUNTER — Telehealth: Payer: Self-pay | Admitting: Vascular Surgery

## 2014-10-03 NOTE — Telephone Encounter (Signed)
Cydney Ok, [employer] [place], called to inform our office of the passing of Mr. Flippen. He passed away on 09/20/2014 and was a patient of Dr. Scot Dock. Tate's contact is [phone].

## 2016-02-15 ENCOUNTER — Other Ambulatory Visit: Payer: Self-pay | Admitting: Nurse Practitioner

## 2016-10-17 IMAGING — CT CT ANGIO HEAD
1 of 10 series · 5 of 47 positions shown · IV contrast (OMNI)
Comparison: CT head without contrast 06/11/2014.

CLINICAL DATA: Bilateral carotid artery stenosis. Dominant left
brain infarct with right hemi paresis and hemi sensory loss. He is

EXAM:
CT ANGIOGRAPHY HEAD AND NECK
TECHNIQUE: Multidetector CT imaging of the head and neck was performed using
the standard protocol during bolus administration of intravenous
contrast. Multiplanar CT image reconstructions and MIPs were
obtained to evaluate the vascular anatomy. Carotid stenosis
measurements (when applicable) are obtained utilizing NASCET
criteria, using the distal internal carotid diameter as the
denominator.
CONTRAST:  100mL OMNIPAQUE IOHEXOL 350 MG/ML SOLN

[Series 5: carotid/brain 2.0 i30f 3 · axial · 0.55mm/px · z∈[-274,-42]mm · 5 of 176 slices shown]
[im 30/176  brain]
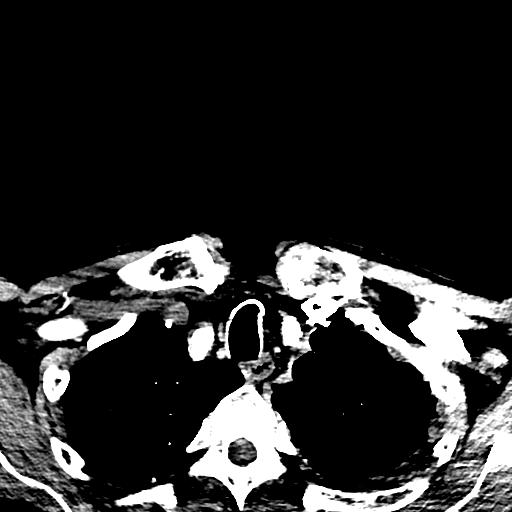
[im 59/176  bone]
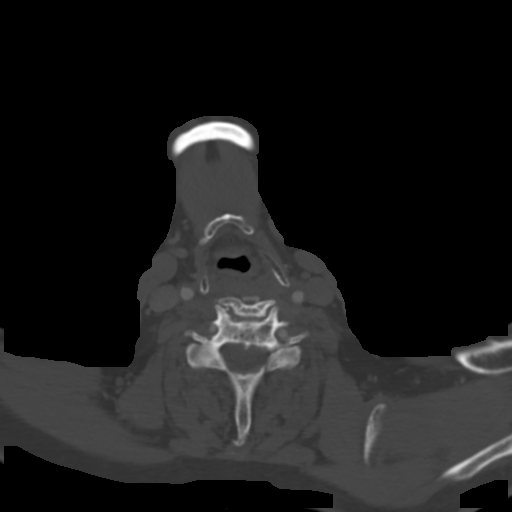
[im 88/176  brain]
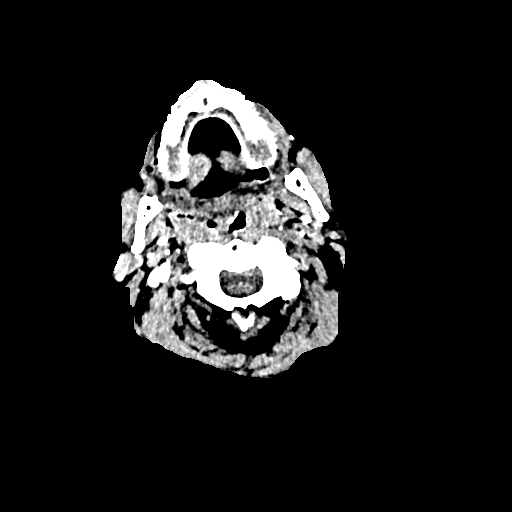
[im 117/176  bone]
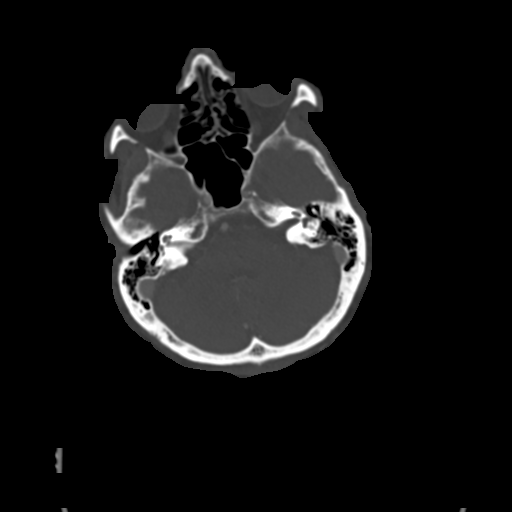
[im 146/176  brain]
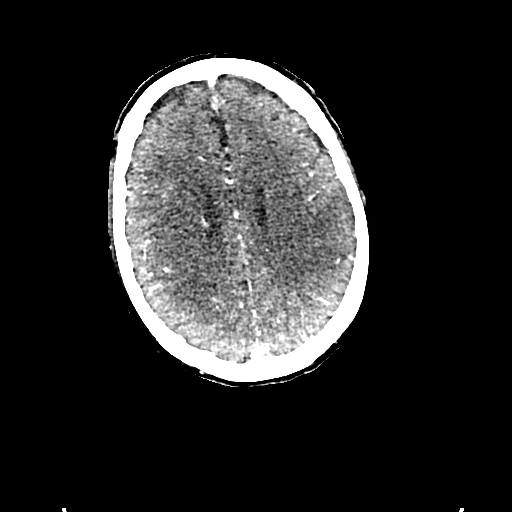

[5 of 47 positions shown; findings below may reference images not displayed]

FINDINGS: CT HEAD

Brain: A a left parietal and possibly posterior left frontal lobe
infarct is better visualized with loss of gray-white differentiation
and sulcal effacement on today's study. There is no significant mass
effect to generate ventricular effacement or midline shift.
Scattered subcortical white matter hypoattenuation is present
throughout.

Calvarium and skull base: Negative

Paranasal sinuses: Clear

Orbits: The globes and orbits are intact.

CTA NECK

Aortic arch: Atherosclerotic irregularity calcifications are present
within the aortic arch. A 3 vessel configuration is present.
Atherosclerotic calcifications are present at the origins. There is
mild narrowing of the proximal left common carotid artery without a
significant stenosis relative to the more distal vessel.

Right carotid system: The right common carotid artery demonstrates
mild atherosclerotic change without a significant stenosis. There is
significant calcified and noncalcified plaque at the bifurcation.
The lumen of the right internal carotid artery is narrowed to
mm. This compares with a more normal distal vessel measurement of
4.5 mm. There no tandem stenoses in the neck.

Left carotid system: More prominent atherosclerotic changes are
present in the left common carotid artery without a significant
stenosis. The left internal carotid artery is occluded 5 mm from the
bifurcation. There is minimal contrast in the left ICA at the
skullbase. There is no reconstitution in the neck.

Vertebral arteries:Atherosclerotic changes are present at the
origins of the vertebral arteries bilaterally. The vessels originate
from the subclavian arteries. The left vertebral artery is the
dominant vessel. It is tortuous with some calcifications but no
significant stenosis. The right vertebral artery is hypoplastic but
without focal stenosis.

Skeleton: Heart degenerative changes are present within cervical
spine, most evident at C5-6 with uncovertebral spurring and endplate
changes. Endplate changes are also noted at C6-7. There is slight
degenerative anterolisthesis at C3-4 and C4-5. No focal lytic or
blastic lesions are evident. The patient is edentulous.

Other neck: A soft tissue mass lesion is noted just lateral to the
sternocleidomastoid and posterior to the vertical ramus of mandible
measuring 4.2 x 1.8 x 2.2 cm. There is a central calcification.
Asymmetric soft tissue is present in the right posterolateral
hypopharynx.

There is marked irregularity of the pleura compatible with known
metastatic disease on the left. A posterior rounded soft tissue
density may represent rounded atelectasis versus metastatic disease
or loculated fluid. This was not hypermetabolic on the recent PET
scan. Paraseptal emphysematous changes are evident.

CTA HEAD

Anterior circulation: Atherosclerotic changes are present within the
cavernous right internal carotid artery there is no scratch the the
lumen is narrowed but without a significant stenosis relative to the
more distal vessel. The terminal right ICA is intact. The left ICA
demonstrates some contrast beginning at the skullbase extending
through the cavernous segment. The terminal left ICA is small. The
A1 and M1 segments are visualized bilaterally. There is some
attenuation of the vessels without a focal stenosis. a.m. MCA branch
vessels are moderately attenuated bilaterally, more prominently on
the left. The posterior left M2 branches are occluded. Moderate
medium and distal ACA branch vessel stenoses are noted.

Posterior circulation: The a left vertebral artery is the dominant
vessel. The vertebrobasilar junction is intact. Both posterior
cerebral arteries originate from the basilar tip. A PCA branch
vessels demonstrate moderate attenuation.

Venous sinuses: The dural sinuses are patent. Straight sinus is
patent. The deep veins are patent.
IMPRESSION: 1. Occlusion of the left internal carotid artery with reconstituted
flow at the skullbase.
2. 70% stenosis of the right internal carotid artery just beyond the
bifurcation.
3. Atherosclerotic changes at the arch without significant proximal
stenosis.
4. Atherosclerotic changes in the cavernous right internal carotid
artery with tapering of the vessel but no significant stenosis
relative to the more distal vessels.
5. Asymmetric attenuation of MCA branch vessels on the left
corresponding with the area of acute/subacute nonhemorrhagic
infarction within the left parietal and probably posterior left
frontal lobe.
6. Moderate diffuse medium and small vessel disease.
7. 4.2 cm soft tissue mass anterior and superior to the right
sternocleidomastoid muscle. This could be related to the tail of the
parotid, representing a parotid tumor. Metastatic disease is also
considered.
8. Nodular thickening of the pleura in the left chest compatible
with known metastatic disease.
9. Loculated fluid versus rounded atelectasis or tumor in the left
lung is stable.
10. Asymmetric soft tissue in the posterior right hypopharynx is
concerning for tumor. This area should be amenable to evaluation.

## 2018-01-14 ENCOUNTER — Encounter: Payer: Self-pay | Admitting: Internal Medicine
